# Patient Record
Sex: Male | Born: 1955 | Race: White | Hispanic: No | Marital: Married | Smoking: Never smoker
Health system: Southern US, Community
[De-identification: ages and names within clinical notes are randomized; demographics above are authoritative.]

## PROBLEM LIST (undated history)

## (undated) DIAGNOSIS — Z87442 Personal history of urinary calculi: Secondary | ICD-10-CM

## (undated) DIAGNOSIS — C801 Malignant (primary) neoplasm, unspecified: Secondary | ICD-10-CM

## (undated) DIAGNOSIS — I82409 Acute embolism and thrombosis of unspecified deep veins of unspecified lower extremity: Secondary | ICD-10-CM

## (undated) DIAGNOSIS — I2510 Atherosclerotic heart disease of native coronary artery without angina pectoris: Secondary | ICD-10-CM

## (undated) DIAGNOSIS — M1990 Unspecified osteoarthritis, unspecified site: Secondary | ICD-10-CM

## (undated) DIAGNOSIS — G4730 Sleep apnea, unspecified: Secondary | ICD-10-CM

## (undated) DIAGNOSIS — I10 Essential (primary) hypertension: Secondary | ICD-10-CM

## (undated) DIAGNOSIS — D696 Thrombocytopenia, unspecified: Secondary | ICD-10-CM

## (undated) DIAGNOSIS — M792 Neuralgia and neuritis, unspecified: Secondary | ICD-10-CM

## (undated) DIAGNOSIS — Z1211 Encounter for screening for malignant neoplasm of colon: Secondary | ICD-10-CM

## (undated) DIAGNOSIS — M1A9XX0 Chronic gout, unspecified, without tophus (tophi): Secondary | ICD-10-CM

## (undated) DIAGNOSIS — Z86718 Personal history of other venous thrombosis and embolism: Secondary | ICD-10-CM

## (undated) DIAGNOSIS — M5136 Other intervertebral disc degeneration, lumbar region: Secondary | ICD-10-CM

## (undated) DIAGNOSIS — F5101 Primary insomnia: Principal | ICD-10-CM

## (undated) DIAGNOSIS — G629 Polyneuropathy, unspecified: Secondary | ICD-10-CM

## (undated) DIAGNOSIS — M21372 Foot drop, left foot: Secondary | ICD-10-CM

## (undated) DIAGNOSIS — I82401 Acute embolism and thrombosis of unspecified deep veins of right lower extremity: Principal | ICD-10-CM

## (undated) DIAGNOSIS — R7989 Other specified abnormal findings of blood chemistry: Principal | ICD-10-CM

## (undated) DIAGNOSIS — G8929 Other chronic pain: Secondary | ICD-10-CM

## (undated) DIAGNOSIS — I509 Heart failure, unspecified: Secondary | ICD-10-CM

## (undated) DIAGNOSIS — M546 Pain in thoracic spine: Secondary | ICD-10-CM

## (undated) DIAGNOSIS — I82511 Chronic embolism and thrombosis of right femoral vein: Principal | ICD-10-CM

## (undated) DIAGNOSIS — R2681 Unsteadiness on feet: Secondary | ICD-10-CM

## (undated) DIAGNOSIS — I82531 Chronic embolism and thrombosis of right popliteal vein: Principal | ICD-10-CM

## (undated) DIAGNOSIS — R161 Splenomegaly, not elsewhere classified: Secondary | ICD-10-CM

## (undated) DIAGNOSIS — G4733 Obstructive sleep apnea (adult) (pediatric): Secondary | ICD-10-CM

## (undated) DIAGNOSIS — I4819 Other persistent atrial fibrillation: Secondary | ICD-10-CM

## (undated) DIAGNOSIS — D508 Other iron deficiency anemias: Secondary | ICD-10-CM

## (undated) DIAGNOSIS — I4891 Unspecified atrial fibrillation: Secondary | ICD-10-CM

## (undated) DIAGNOSIS — R5383 Other fatigue: Secondary | ICD-10-CM

## (undated) DIAGNOSIS — Z981 Arthrodesis status: Secondary | ICD-10-CM

## (undated) DIAGNOSIS — M1A09X0 Idiopathic chronic gout, multiple sites, without tophus (tophi): Secondary | ICD-10-CM

## (undated) DIAGNOSIS — R160 Hepatomegaly, not elsewhere classified: Secondary | ICD-10-CM

## (undated) DIAGNOSIS — M51369 Other intervertebral disc degeneration, lumbar region without mention of lumbar back pain or lower extremity pain: Secondary | ICD-10-CM

## (undated) DIAGNOSIS — M21371 Foot drop, right foot: Secondary | ICD-10-CM

## (undated) DIAGNOSIS — R5381 Other malaise: Secondary | ICD-10-CM

## (undated) DIAGNOSIS — I82501 Chronic embolism and thrombosis of unspecified deep veins of right lower extremity: Secondary | ICD-10-CM

## (undated) DIAGNOSIS — I442 Atrioventricular block, complete: Principal | ICD-10-CM

## (undated) DIAGNOSIS — Z125 Encounter for screening for malignant neoplasm of prostate: Secondary | ICD-10-CM

## (undated) HISTORY — PX: STRABISMUS SURGERY: SHX218

## (undated) HISTORY — PX: TESTICLE SURGERY: SHX794

## (undated) HISTORY — PX: CHOLECYSTECTOMY: SHX55

## (undated) HISTORY — PX: LEG SURGERY: SHX1003

## (undated) HISTORY — PX: HAND SURGERY: SHX662

## (undated) HISTORY — PX: BACK SURGERY: SHX140

## (undated) HISTORY — PX: KNEE ARTHROSCOPY: SUR90

## (undated) HISTORY — PX: CORONARY ARTERY BYPASS GRAFT: SHX141

## (undated) HISTORY — PX: SHOULDER ARTHROSCOPY: SHX128

---

## 2000-08-29 ENCOUNTER — Encounter: Payer: Self-pay | Admitting: Physical Medicine and Rehabilitation

## 2000-08-29 ENCOUNTER — Encounter
Admission: RE | Admit: 2000-08-29 | Discharge: 2000-08-29 | Payer: Self-pay | Admitting: Physical Medicine and Rehabilitation

## 2000-09-09 ENCOUNTER — Encounter: Payer: Self-pay | Admitting: Specialist

## 2000-09-09 ENCOUNTER — Ambulatory Visit (HOSPITAL_COMMUNITY): Admission: RE | Admit: 2000-09-09 | Discharge: 2000-09-09 | Payer: Self-pay | Admitting: Specialist

## 2011-01-17 DIAGNOSIS — J309 Allergic rhinitis, unspecified: Secondary | ICD-10-CM | POA: Insufficient documentation

## 2012-05-14 DIAGNOSIS — M51369 Other intervertebral disc degeneration, lumbar region without mention of lumbar back pain or lower extremity pain: Secondary | ICD-10-CM | POA: Insufficient documentation

## 2012-05-14 DIAGNOSIS — M5136 Other intervertebral disc degeneration, lumbar region: Secondary | ICD-10-CM | POA: Insufficient documentation

## 2012-05-14 DIAGNOSIS — M47816 Spondylosis without myelopathy or radiculopathy, lumbar region: Secondary | ICD-10-CM | POA: Insufficient documentation

## 2012-05-18 DIAGNOSIS — M48061 Spinal stenosis, lumbar region without neurogenic claudication: Secondary | ICD-10-CM | POA: Insufficient documentation

## 2012-08-03 DIAGNOSIS — H6980 Other specified disorders of Eustachian tube, unspecified ear: Secondary | ICD-10-CM | POA: Insufficient documentation

## 2013-10-29 NOTE — H&P (Signed)
Formatting of this note might be different from the original.  Planned procedure: LHC    Referring Cardiologist: Chad SoldersJohn Powers, MD    Preprocedure diagnosis: Abnormal stress Cardiolite suggestive apical ischemia.     UPDATED H&P:    Chart reviewed. Patient seen and re-examined.  History is as per recent office prep for surgery note of 10/26/2013.    ROS and Physical Exam are without significant change from the last office visit, and are as documented there.    Risks and benefits of planned procedure have been previously reviewed with the patient who understands and agrees to proceed. Plan is as per the office prep for surgery note and my note of AUg 06.    Chad Rosario, 10/29/2013    Electronically signed by Chad StarchJohn M McCabe, MD at 10/29/2013 11:51 AM EDT

## 2013-10-30 DIAGNOSIS — Z95820 Peripheral vascular angioplasty status with implants and grafts: Secondary | ICD-10-CM | POA: Insufficient documentation

## 2013-10-30 NOTE — Discharge Summary (Signed)
Formatting of this note is different from the original.  [facility]  Discharge Summary    PCP: Consuello Bossier, MD (General)  Discharge Details     Admit date:         10/29/2013  Discharge date:        10/30/2013   Hospital LOS:    1 days    Active Hospital Problems    Diagnosis Date Noted POA   ? S/P angioplasty with stent 10/30/2013 Not Applicable     Resolved Hospital Problems    Diagnosis Date Noted Date Resolved POA   No resolved problems to display.       Current Discharge Medication List     NEW medications    Details   aspirin (ECOTRIN) 81 mg EC tablet Take 1 tablet (81 mg total) by mouth daily.  Start date: 10/30/2013, End date: 10/30/2014     atorvastatin (LIPITOR) 40 mg tablet Take 1 tablet (40 mg total) by mouth at bedtime.  Start date: 10/30/2013, End date: 10/30/2014     metoprolol succinate (TOPROL-XL) 25 mg 24 hr tablet Take 1 tablet (25 mg total) by mouth daily.  Start date: 10/30/2013, End date: 10/30/2014     !! Prasugrel HCl (EFFIENT) 10 mg TABS Take 1 tablet (10 mg total) by mouth daily.  Start date: 10/30/2013     !! Prasugrel HCl (EFFIENT) 10 mg TABS Take 1 tablet (10 mg total) by mouth daily.  Start date: 10/30/2013      !! - Potential duplicate medications found. Please discuss with provider.     CONTINUED medications    Details   fexofenadine (ALLEGRA) 180 MG tablet Take 180 mg by mouth daily.     lisinopril (PRINIVIL,ZESTRIL) 10 mg tablet Take 1 tablet (10 mg total) by mouth daily.     sildenafil citrate (VIAGRA) 100 mg tablet Take 1 tablet (100 mg total) by mouth as needed for Erectile Dysfunction.     zolpidem tartrate (AMBIEN) 10 mg tablet Take one tablet by mouth at bedtime as needed for sleep.         Hospital Course   Physicians involved in care during this hospitalization  Attending Provider: Lucrezia Starch, MD  Admitting Provider: Lucrezia Starch, MD    Indication for Admission: Coronary disease status post stent placement.  Hospital Course:        For details see admission  history and physical. Patient with history of chest discomfort and recent abnormal stress test was referred for cardiac catheterization which was done by Dr. Ivan Croft yesterday. He was found to have obstructive disease in his LAD. He underwent stent placement with a bare metal stent. He was noted to have thrombocytopenia of unknown etiology. Overnight laboratory values within expected limits. No chest discomfort. Vital signs stable. He'll be discharged home later this morning.    He is going to follow-up with his primary care physician next week with recheck CBC to evaluate his platelet count. Ideally Dr. Ivan Croft like for him to stay on Effient for a couple of months but minimum of one month with 81 mg aspirin. He was also discharged on low-dose beta blocker and statin therapy.    Follow-up the mid-level clinic in [place] in 2-3 weeks, Dr. Leeann Must in 3 months.    [facility] Care     Appointments which have been scheduled     Dec 02, 2013  3:00 PM   Lab with [facility]   [facility] (--)    [address]  [place] [address]   [phone]        Mar 03, 2014  8:00 AM   [facility]   [facility] (--)    [address]  [place] [address]   [phone]        Mar 08, 2014  8:30 AM   Office Visit with Beryle FlockAaron Michael Boals, MD   [facility] (--)    [address]  [place] [address]   [phone]           Code Status:   No Order    Time spent in discharge process:  less than 30 minutes     Electronically signed:  Lars Massonodd E Crawford, PA  10/30/2013 / 12:39 PM    Electronically signed by Lucrezia StarchJohn M McCabe, MD at 11/02/2013  5:10 PM EDT

## 2013-10-30 NOTE — Nursing Note (Signed)
Formatting of this note might be different from the original.  Discharge summary and wound care reviewed with pt. Pt stated understanding. Effient in hand at discharge.  Electronically signed by Arlis Portaherri Sue Sutphin, RN at 10/30/2013 12:38 PM EDT

## 2013-11-16 DIAGNOSIS — I2510 Atherosclerotic heart disease of native coronary artery without angina pectoris: Secondary | ICD-10-CM | POA: Insufficient documentation

## 2014-05-04 DIAGNOSIS — Z951 Presence of aortocoronary bypass graft: Secondary | ICD-10-CM | POA: Insufficient documentation

## 2014-05-05 DIAGNOSIS — E785 Hyperlipidemia, unspecified: Secondary | ICD-10-CM | POA: Insufficient documentation

## 2014-08-05 DIAGNOSIS — I10 Essential (primary) hypertension: Secondary | ICD-10-CM | POA: Insufficient documentation

## 2015-03-09 DIAGNOSIS — D126 Benign neoplasm of colon, unspecified: Secondary | ICD-10-CM | POA: Insufficient documentation

## 2015-03-21 DIAGNOSIS — R161 Splenomegaly, not elsewhere classified: Secondary | ICD-10-CM | POA: Insufficient documentation

## 2015-06-28 DIAGNOSIS — M1000 Idiopathic gout, unspecified site: Secondary | ICD-10-CM | POA: Insufficient documentation

## 2015-09-22 DIAGNOSIS — I10 Essential (primary) hypertension: Secondary | ICD-10-CM | POA: Insufficient documentation

## 2015-09-22 DIAGNOSIS — T79A22A Traumatic compartment syndrome of left lower extremity, initial encounter: Secondary | ICD-10-CM | POA: Insufficient documentation

## 2015-11-01 DIAGNOSIS — I82411 Acute embolism and thrombosis of right femoral vein: Secondary | ICD-10-CM | POA: Insufficient documentation

## 2015-11-13 DIAGNOSIS — C44320 Squamous cell carcinoma of skin of unspecified parts of face: Secondary | ICD-10-CM | POA: Insufficient documentation

## 2015-12-08 ENCOUNTER — Other Ambulatory Visit: Payer: Self-pay | Admitting: Neurological Surgery

## 2015-12-08 DIAGNOSIS — M48061 Spinal stenosis, lumbar region without neurogenic claudication: Secondary | ICD-10-CM

## 2015-12-18 ENCOUNTER — Ambulatory Visit (INDEPENDENT_AMBULATORY_CARE_PROVIDER_SITE_OTHER): Payer: BLUE CROSS/BLUE SHIELD

## 2015-12-18 DIAGNOSIS — M4806 Spinal stenosis, lumbar region: Secondary | ICD-10-CM | POA: Diagnosis not present

## 2015-12-18 DIAGNOSIS — M48061 Spinal stenosis, lumbar region without neurogenic claudication: Secondary | ICD-10-CM

## 2015-12-18 DIAGNOSIS — M5116 Intervertebral disc disorders with radiculopathy, lumbar region: Secondary | ICD-10-CM | POA: Diagnosis not present

## 2016-07-26 ENCOUNTER — Other Ambulatory Visit: Payer: Self-pay | Admitting: Neurological Surgery

## 2016-07-26 DIAGNOSIS — M5136 Other intervertebral disc degeneration, lumbar region: Secondary | ICD-10-CM

## 2016-08-04 ENCOUNTER — Ambulatory Visit
Admission: RE | Admit: 2016-08-04 | Discharge: 2016-08-04 | Disposition: A | Discharge status: Home / Self Care | Payer: BLUE CROSS/BLUE SHIELD | Source: Ambulatory Visit | Attending: Neurological Surgery | Admitting: Neurological Surgery

## 2016-08-04 DIAGNOSIS — M5136 Other intervertebral disc degeneration, lumbar region: Secondary | ICD-10-CM

## 2016-08-06 ENCOUNTER — Other Ambulatory Visit: Payer: Self-pay | Admitting: Neurological Surgery

## 2016-08-06 ENCOUNTER — Ambulatory Visit (INDEPENDENT_AMBULATORY_CARE_PROVIDER_SITE_OTHER): Payer: 59

## 2016-08-06 DIAGNOSIS — M48062 Spinal stenosis, lumbar region with neurogenic claudication: Secondary | ICD-10-CM

## 2016-08-06 DIAGNOSIS — M4185 Other forms of scoliosis, thoracolumbar region: Secondary | ICD-10-CM

## 2016-08-06 DIAGNOSIS — M5136 Other intervertebral disc degeneration, lumbar region: Secondary | ICD-10-CM

## 2016-08-13 ENCOUNTER — Other Ambulatory Visit: Payer: Self-pay | Admitting: Neurological Surgery

## 2016-09-03 NOTE — Pre-Procedure Instructions (Signed)
Frederick Phillips  09/03/2016      [facility], [place] [address] Phone: [phone] Fax: [phone]    Your procedure is scheduled on June 21  Report to [place] at 900 A.M.  Call this number if you have problems the morning of surgery:  [phone]   Remember:  Do not eat food or drink liquids after midnight.  Take these medicines the morning of surgery with A SIP OF WATER fexofenadine (Allegra)  Stop taking aspirin, and Xarelto as directed by your Dr.  Stop taking BC's, Goody's, Herbal medications, Fish Oil, Vitamins, Ibuprofen, Advil, Motrin, Aleve   Do not wear jewelry, make-up or nail polish.  Do not wear lotions, powders, or perfumes, or deoderant.  Do not shave 48 hours prior to surgery.  Men may shave face and neck.  Do not bring valuables to the hospital.  [facility] is not responsible for any belongings or valuables.  Contacts, dentures or bridgework may not be worn into surgery.  Leave your suitcase in the car.  After surgery it may be brought to your room.  For patients admitted to the hospital, discharge time will be determined by your treatment team.  Patients discharged the day of surgery will not be allowed to drive home.    Special instructions:  [name] - Preparing for Surgery  Before surgery, you can play an important role.  Because skin is not sterile, your skin needs to be as free of germs as possible.  You can reduce the number of germs on you skin by washing with CHG (chlorahexidine gluconate) soap before surgery.  CHG is an antiseptic cleaner which kills germs and bonds with the skin to continue killing germs even after washing.  Please DO NOT use if you have an allergy to CHG or antibacterial soaps.  If your skin becomes reddened/irritated stop using the CHG and inform your nurse when you arrive at Short Stay.  Do not shave  (including legs and underarms) for at least 48 hours prior to the first CHG shower.  You may shave your face.  Please follow these instructions carefully:   1.  Shower with CHG Soap the night before surgery and the   morning of Surgery.  2.  If you choose to wash your hair, wash your hair first as usual with your normal shampoo.  3.  After you shampoo, rinse your hair and body thoroughly to remove the Shampoo.  4.  Use CHG as you would any other liquid soap.  You can apply chg directly to the skin and wash gently with scrungie or a clean washcloth.  5.  Apply the CHG Soap to your body ONLY FROM THE NECK DOWN.  Do not use on open wounds or open sores.  Avoid contact with your eyes,       ears, mouth and genitals (private parts).  Wash genitals (private parts)  with your normal soap.  6.  Wash thoroughly, paying special attention to the area where your surgery will be performed.  7.  Thoroughly rinse your body with warm water from the neck down.  8.  DO NOT shower/wash with your normal soap after using and rinsing off  the CHG Soap.  9.  Pat yourself dry with a clean towel.            10.  Wear clean pajamas.  11.  Place clean sheets on your bed the night of your first shower and do not sleep with pets.  Day of Surgery  Do not apply any lotions/deoderants the morning of surgery.  Please wear clean clothes to the hospital/surgery center.     Please read over the following fact sheets that you were given. Pain Booklet, Coughing and Deep Breathing, MRSA Information and Surgical Site Infection Prevention

## 2016-09-04 ENCOUNTER — Ambulatory Visit (HOSPITAL_COMMUNITY)
Admission: RE | Admit: 2016-09-04 | Discharge: 2016-09-04 | Disposition: A | Discharge status: Home / Self Care | Payer: 59 | Source: Ambulatory Visit | Attending: Neurological Surgery | Admitting: Neurological Surgery

## 2016-09-04 ENCOUNTER — Encounter (HOSPITAL_COMMUNITY): Payer: Self-pay

## 2016-09-04 ENCOUNTER — Encounter (HOSPITAL_COMMUNITY)
Admission: RE | Admit: 2016-09-04 | Discharge: 2016-09-04 | Disposition: A | Discharge status: Home / Self Care | Payer: 59 | Source: Ambulatory Visit | Attending: Neurological Surgery | Admitting: Neurological Surgery

## 2016-09-04 DIAGNOSIS — Z01810 Encounter for preprocedural cardiovascular examination: Secondary | ICD-10-CM | POA: Insufficient documentation

## 2016-09-04 DIAGNOSIS — Z01818 Encounter for other preprocedural examination: Secondary | ICD-10-CM | POA: Diagnosis present

## 2016-09-04 DIAGNOSIS — M4310 Spondylolisthesis, site unspecified: Secondary | ICD-10-CM

## 2016-09-04 DIAGNOSIS — Z955 Presence of coronary angioplasty implant and graft: Secondary | ICD-10-CM | POA: Diagnosis not present

## 2016-09-04 DIAGNOSIS — Z01812 Encounter for preprocedural laboratory examination: Secondary | ICD-10-CM | POA: Insufficient documentation

## 2016-09-04 HISTORY — DX: Acute embolism and thrombosis of unspecified deep veins of unspecified lower extremity: I82.409

## 2016-09-04 HISTORY — DX: Malignant (primary) neoplasm, unspecified: C80.1

## 2016-09-04 HISTORY — DX: Personal history of urinary calculi: Z87.442

## 2016-09-04 HISTORY — DX: Atherosclerotic heart disease of native coronary artery without angina pectoris: I25.10

## 2016-09-04 HISTORY — DX: Unspecified osteoarthritis, unspecified site: M19.90

## 2016-09-04 HISTORY — DX: Sleep apnea, unspecified: G47.30

## 2016-09-04 HISTORY — DX: Essential (primary) hypertension: I10

## 2016-09-04 LAB — BASIC METABOLIC PANEL
ANION GAP: 9 (ref 5–15)
BUN: 16 mg/dL (ref 6–20)
CHLORIDE: 104 mmol/L (ref 101–111)
CO2: 24 mmol/L (ref 22–32)
CREATININE: 1.07 mg/dL (ref 0.61–1.24)
Calcium: 9.4 mg/dL (ref 8.9–10.3)
GFR calc non Af Amer: >60 mL/min (ref >60)
Glucose, Bld: 82 mg/dL (ref 65–99)
POTASSIUM: 4.5 mmol/L (ref 3.5–5.1)
Sodium: 137 mmol/L (ref 135–145)

## 2016-09-04 LAB — TYPE AND SCREEN
ABO/RH(D): A POS
Antibody Screen: NEGATIVE

## 2016-09-04 LAB — CBC WITH DIFFERENTIAL/PLATELET
Basophils Absolute: 0.1 10*3/uL (ref 0.0–0.1)
Basophils Relative: 1 %
Eosinophils Absolute: 0.7 10*3/uL (ref 0.0–0.7)
Eosinophils Relative: 9 %
HEMATOCRIT: 45.6 % (ref 39.0–52.0)
Hemoglobin: 16.3 g/dL (ref 13.0–17.0)
LYMPHS ABS: 1.9 10*3/uL (ref 0.7–4.0)
LYMPHS PCT: 25 %
MCH: 29.3 pg (ref 26.0–34.0)
MCHC: 35.7 g/dL (ref 30.0–36.0)
MCV: 82 fL (ref 78.0–100.0)
MONOS PCT: 11 %
Monocytes Absolute: 0.9 10*3/uL (ref 0.1–1.0)
NEUTROS ABS: 4 10*3/uL (ref 1.7–7.7)
NEUTROS PCT: 53 %
Platelets: 106 10*3/uL — ABNORMAL LOW (ref 150–400)
RBC: 5.56 MIL/uL (ref 4.22–5.81)
RDW: 14 % (ref 11.5–15.5)
WBC: 7.5 10*3/uL (ref 4.0–10.5)

## 2016-09-04 LAB — SURGICAL PCR SCREEN
MRSA, PCR: NEGATIVE
Staphylococcus aureus: NEGATIVE

## 2016-09-04 LAB — ABO/RH: ABO/RH(D): A POS

## 2016-09-04 MED ORDER — CHLORHEXIDINE GLUCONATE CLOTH 2 % EX PADS: 6.0000 | MEDICATED_PAD | Freq: Once | CUTANEOUS | Status: DC

## 2016-09-04 NOTE — Progress Notes (Signed)
PCP - Joneen Boers Cardiologist - Lamar Blinks Pulmonologist- Dr. Michela Pitcher Pt sees Dr. Gayla Medicus regarding CPAP  Chest x-ray - 09/04/2016  EKG - 09/04/2016  Stress Test - requested from MD ECHO - requested from MD Cardiac Cath - requested from [facility]  Sleep Study - pt unsure where or when this was done, "years ago" CPAP - pt wears CPAP and states he knows his pressure settings, will bring mask DOS  Pt denies any cardiac issues since his CABG. Pt on Xarelto and Aspirin for hx DVT. Pt states he was instructed to stop taking this medicine- last dose 09/03/16  Patient denies shortness of breath, fever, cough and chest pain at PAT appointment  Patient verbalized understanding of instructions that were given to them at the PAT appointment. Patient was also instructed that they will need to review over the PAT instructions again at home before surgery.

## 2016-09-05 NOTE — Progress Notes (Signed)
Anesthesia Chart Review:  Pt is a 61 year old male scheduled for L2-3, L3-4, L4-5 PLIF, L5-S1 bilateral hemilaminectomy on 09/12/2016 with Sherley Bounds, M.D.  - PCP is Joneen Boers, MD (notes in care everywhere)  - Cardiologist is Lamar Blinks, MD who cleared pt for surgery (notes in care everywhere). Last office visit 01/29/16; 1 year f/u recommended.   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 Never smoker. BMI 36.5.  Medications include: ASA 81 mg, lisinopril, xarelto, simvastatin. Pt reported last dose xarelto 09/03/16.  - I spoke with Lorriane Shire from Dr. Ronnald Ramp' office about timing of xarelto hold for surgery.  She spoke with pt who will resume xarelto now and hold 3 days before surgery.   Preoperative labs reviewed.   - Platelets 106.  - PT will be obtained DOS.   CXR 09/04/16:  1. No evidence of active disease. 2. Status post CABG and coronary stenting.  EKG 09/04/16: NSR  Stress echo 08/05/14 (from Dr. Shearon Stalls notes): good exercise tolerance and no ischemia and no significant valvular abnormalities.  Cardiac cath 05/06/14 (care everywhere):  - Severe native LAD disease as mentioned above. - 2 out of 2 grafts are patent - Nonobstructive coronary disease elsewhere - Normal LVEDP  If no changes, I anticipate pt can proceed with surgery as scheduled.   Willeen Cass, FNP-BC [facility] Short Stay Surgical Center/Anesthesiology Phone: [phone] 09/05/2016 1:39 PM

## 2016-09-11 MED ORDER — DEXTROSE 5 % IV SOLN
3.0000 g | INTRAVENOUS | Status: AC
  Administered 2016-09-12 (×2): 3 g via INTRAVENOUS
  Filled 2016-09-11: qty 3000

## 2016-09-11 MED ORDER — DEXAMETHASONE SODIUM PHOSPHATE 10 MG/ML IJ SOLN
10.0000 mg | INTRAMUSCULAR | Status: AC
  Administered 2016-09-12: 10 mg via INTRAVENOUS
  Filled 2016-09-11: qty 1

## 2016-09-12 ENCOUNTER — Inpatient Hospital Stay (HOSPITAL_COMMUNITY): Payer: 59 | Admitting: Emergency Medicine

## 2016-09-12 ENCOUNTER — Inpatient Hospital Stay (HOSPITAL_COMMUNITY)
Admission: RE | Admit: 2016-09-12 | Discharge: 2016-09-13 | DRG: 455 | Disposition: A | Discharge status: Home / Self Care | Payer: 59 | Source: Ambulatory Visit | Attending: Neurological Surgery | Admitting: Neurological Surgery

## 2016-09-12 ENCOUNTER — Inpatient Hospital Stay (HOSPITAL_COMMUNITY)
Admission: RE | Disposition: A | Discharge status: Home / Self Care | Payer: Self-pay | Source: Ambulatory Visit | Attending: Neurological Surgery

## 2016-09-12 ENCOUNTER — Inpatient Hospital Stay (HOSPITAL_COMMUNITY): Payer: 59

## 2016-09-12 ENCOUNTER — Inpatient Hospital Stay (HOSPITAL_COMMUNITY): Payer: 59 | Admitting: Anesthesiology

## 2016-09-12 ENCOUNTER — Encounter (HOSPITAL_COMMUNITY): Payer: Self-pay | Admitting: Certified Registered Nurse Anesthetist

## 2016-09-12 DIAGNOSIS — M48061 Spinal stenosis, lumbar region without neurogenic claudication: Secondary | ICD-10-CM | POA: Diagnosis present

## 2016-09-12 DIAGNOSIS — E669 Obesity, unspecified: Secondary | ICD-10-CM | POA: Diagnosis present

## 2016-09-12 DIAGNOSIS — Z6837 Body mass index (BMI) 37.0-37.9, adult: Secondary | ICD-10-CM | POA: Diagnosis not present

## 2016-09-12 DIAGNOSIS — Z85828 Personal history of other malignant neoplasm of skin: Secondary | ICD-10-CM | POA: Diagnosis not present

## 2016-09-12 DIAGNOSIS — M5136 Other intervertebral disc degeneration, lumbar region: Secondary | ICD-10-CM | POA: Diagnosis present

## 2016-09-12 DIAGNOSIS — Z419 Encounter for procedure for purposes other than remedying health state, unspecified: Secondary | ICD-10-CM

## 2016-09-12 DIAGNOSIS — Z951 Presence of aortocoronary bypass graft: Secondary | ICD-10-CM | POA: Diagnosis not present

## 2016-09-12 DIAGNOSIS — M545 Low back pain: Secondary | ICD-10-CM | POA: Diagnosis present

## 2016-09-12 DIAGNOSIS — M4316 Spondylolisthesis, lumbar region: Secondary | ICD-10-CM | POA: Diagnosis present

## 2016-09-12 DIAGNOSIS — I10 Essential (primary) hypertension: Secondary | ICD-10-CM | POA: Diagnosis present

## 2016-09-12 DIAGNOSIS — I2510 Atherosclerotic heart disease of native coronary artery without angina pectoris: Secondary | ICD-10-CM | POA: Diagnosis present

## 2016-09-12 DIAGNOSIS — Z981 Arthrodesis status: Secondary | ICD-10-CM

## 2016-09-12 DIAGNOSIS — G4730 Sleep apnea, unspecified: Secondary | ICD-10-CM | POA: Diagnosis present

## 2016-09-12 HISTORY — PX: LUMBAR LAMINECTOMY/DECOMPRESSION MICRODISCECTOMY: SHX5026

## 2016-09-12 LAB — PROTIME-INR
INR: 1.05
Prothrombin Time: 13.7 seconds (ref 11.4–15.2)

## 2016-09-12 SURGERY — POSTERIOR LUMBAR FUSION 3 LEVEL
Anesthesia: General | Site: Back

## 2016-09-12 MED ORDER — PROPOFOL 10 MG/ML IV BOLUS
INTRAVENOUS | Status: AC
  Filled 2016-09-12: qty 20

## 2016-09-12 MED ORDER — KETOROLAC TROMETHAMINE 30 MG/ML IJ SOLN
INTRAMUSCULAR | Status: DC | PRN
  Administered 2016-09-12: 30 mg via INTRAVENOUS

## 2016-09-12 MED ORDER — THROMBIN 20000 UNITS EX SOLR
CUTANEOUS | Status: DC | PRN
  Administered 2016-09-12: 13:00:00 via TOPICAL

## 2016-09-12 MED ORDER — SODIUM CHLORIDE 0.9 % IV SOLN
250.0000 mL | INTRAVENOUS | Status: DC
  Administered 2016-09-12: 250 mL via INTRAVENOUS

## 2016-09-12 MED ORDER — FENTANYL CITRATE (PF) 100 MCG/2ML IJ SOLN
INTRAMUSCULAR | Status: DC | PRN
  Administered 2016-09-12 (×4): 50 ug via INTRAVENOUS
  Administered 2016-09-12 (×2): 100 ug via INTRAVENOUS
  Administered 2016-09-12 (×2): 50 ug via INTRAVENOUS

## 2016-09-12 MED ORDER — PHENYLEPHRINE 40 MCG/ML (10ML) SYRINGE FOR IV PUSH (FOR BLOOD PRESSURE SUPPORT)
PREFILLED_SYRINGE | INTRAVENOUS | Status: AC
  Filled 2016-09-12: qty 10

## 2016-09-12 MED ORDER — ROCURONIUM BROMIDE 10 MG/ML (PF) SYRINGE
PREFILLED_SYRINGE | INTRAVENOUS | Status: AC
  Filled 2016-09-12: qty 5

## 2016-09-12 MED ORDER — DEXMEDETOMIDINE HCL IN NACL 200 MCG/50ML IV SOLN
INTRAVENOUS | Status: DC | PRN
  Administered 2016-09-12: 15:00:00 via INTRAVENOUS
  Administered 2016-09-12: .7 ug/kg/h via INTRAVENOUS

## 2016-09-12 MED ORDER — SUGAMMADEX SODIUM 200 MG/2ML IV SOLN
INTRAVENOUS | Status: DC | PRN
  Administered 2016-09-12 (×2): 100 mg via INTRAVENOUS

## 2016-09-12 MED ORDER — MORPHINE SULFATE (PF) 2 MG/ML IV SOLN
2.0000 mg | INTRAVENOUS | Status: DC | PRN
  Administered 2016-09-13: 2 mg via INTRAVENOUS
  Filled 2016-09-12: qty 1

## 2016-09-12 MED ORDER — ONDANSETRON HCL 4 MG/2ML IJ SOLN
INTRAMUSCULAR | Status: AC
  Filled 2016-09-12: qty 2

## 2016-09-12 MED ORDER — 0.9 % SODIUM CHLORIDE (POUR BTL) OPTIME
TOPICAL | Status: DC | PRN
  Administered 2016-09-12: 1000 mL

## 2016-09-12 MED ORDER — SENNA 8.6 MG PO TABS
1.0000 | ORAL_TABLET | Freq: Two times a day (BID) | ORAL | Status: DC
  Administered 2016-09-12 – 2016-09-13 (×2): 8.6 mg via ORAL
  Filled 2016-09-12 (×2): qty 1

## 2016-09-12 MED ORDER — ALBUMIN HUMAN 5 % IV SOLN
INTRAVENOUS | Status: DC | PRN
  Administered 2016-09-12: 16:00:00 via INTRAVENOUS

## 2016-09-12 MED ORDER — BACITRACIN 50000 UNITS IM SOLR
INTRAMUSCULAR | Status: DC | PRN
  Administered 2016-09-12: 13:00:00

## 2016-09-12 MED ORDER — DEXMEDETOMIDINE HCL 200 MCG/2ML IV SOLN
INTRAVENOUS | Status: DC | PRN
  Administered 2016-09-12 (×2): 12 ug via INTRAVENOUS

## 2016-09-12 MED ORDER — SODIUM CHLORIDE 0.9% FLUSH
3.0000 mL | Freq: Two times a day (BID) | INTRAVENOUS | Status: DC
  Administered 2016-09-12 – 2016-09-13 (×2): 3 mL via INTRAVENOUS

## 2016-09-12 MED ORDER — OXYCODONE HCL 5 MG PO TABS
ORAL_TABLET | ORAL | Status: AC
  Filled 2016-09-12: qty 1

## 2016-09-12 MED ORDER — FENTANYL CITRATE (PF) 100 MCG/2ML IJ SOLN
INTRAMUSCULAR | Status: AC
  Filled 2016-09-12: qty 2

## 2016-09-12 MED ORDER — PHENYLEPHRINE HCL 10 MG/ML IJ SOLN
INTRAVENOUS | Status: DC | PRN
  Administered 2016-09-12: 20 ug/min via INTRAVENOUS

## 2016-09-12 MED ORDER — ACETAMINOPHEN 650 MG RE SUPP: 650.0000 mg | RECTAL | Status: DC | PRN

## 2016-09-12 MED ORDER — ASPIRIN EC 81 MG PO TBEC
81.0000 mg | DELAYED_RELEASE_TABLET | Freq: Every day | ORAL | Status: DC
  Administered 2016-09-13: 81 mg via ORAL
  Filled 2016-09-12: qty 1

## 2016-09-12 MED ORDER — LACTATED RINGERS IV SOLN
INTRAVENOUS | Status: DC | PRN
  Administered 2016-09-12 (×3): via INTRAVENOUS

## 2016-09-12 MED ORDER — MIDAZOLAM HCL 5 MG/5ML IJ SOLN
INTRAMUSCULAR | Status: DC | PRN
  Administered 2016-09-12: 2 mg via INTRAVENOUS

## 2016-09-12 MED ORDER — SODIUM CHLORIDE 0.9 % IV SOLN
INTRAVENOUS | Status: DC | PRN
  Administered 2016-09-12: 15:00:00 via INTRAVENOUS

## 2016-09-12 MED ORDER — VANCOMYCIN HCL 1000 MG IV SOLR
INTRAVENOUS | Status: DC | PRN
  Administered 2016-09-12: 1000 mg

## 2016-09-12 MED ORDER — OXYCODONE HCL 5 MG PO TABS
5.0000 mg | ORAL_TABLET | ORAL | Status: DC | PRN
  Administered 2016-09-12 – 2016-09-13 (×3): 10 mg via ORAL
  Filled 2016-09-12 (×3): qty 2

## 2016-09-12 MED ORDER — FENTANYL CITRATE (PF) 250 MCG/5ML IJ SOLN
INTRAMUSCULAR | Status: AC
  Filled 2016-09-12: qty 5

## 2016-09-12 MED ORDER — FENTANYL CITRATE (PF) 100 MCG/2ML IJ SOLN
25.0000 ug | INTRAMUSCULAR | Status: DC | PRN
  Administered 2016-09-12 (×2): 50 ug via INTRAVENOUS

## 2016-09-12 MED ORDER — METHOCARBAMOL 500 MG PO TABS
500.0000 mg | ORAL_TABLET | Freq: Four times a day (QID) | ORAL | Status: DC | PRN
  Administered 2016-09-13: 500 mg via ORAL
  Filled 2016-09-12: qty 1

## 2016-09-12 MED ORDER — BUPIVACAINE HCL (PF) 0.25 % IJ SOLN
INTRAMUSCULAR | Status: AC
  Filled 2016-09-12: qty 30

## 2016-09-12 MED ORDER — LISINOPRIL 20 MG PO TABS
40.0000 mg | ORAL_TABLET | Freq: Every day | ORAL | Status: DC
  Administered 2016-09-13: 40 mg via ORAL
  Filled 2016-09-12: qty 2

## 2016-09-12 MED ORDER — BUPIVACAINE HCL (PF) 0.25 % IJ SOLN
INTRAMUSCULAR | Status: DC | PRN
  Administered 2016-09-12: 5 mL

## 2016-09-12 MED ORDER — LIDOCAINE 2% (20 MG/ML) 5 ML SYRINGE
INTRAMUSCULAR | Status: DC | PRN
Start: 2016-09-12 — End: 2016-09-12
  Administered 2016-09-12: 60 mg via INTRAVENOUS

## 2016-09-12 MED ORDER — HYDROMORPHONE HCL 1 MG/ML IJ SOLN
INTRAMUSCULAR | Status: AC
  Filled 2016-09-12: qty 0.5

## 2016-09-12 MED ORDER — PHENOL 1.4 % MT LIQD: 1.0000 | OROMUCOSAL | Status: DC | PRN

## 2016-09-12 MED ORDER — LIDOCAINE 2% (20 MG/ML) 5 ML SYRINGE
INTRAMUSCULAR | Status: AC
  Filled 2016-09-12: qty 10

## 2016-09-12 MED ORDER — THROMBIN 5000 UNITS EX SOLR
CUTANEOUS | Status: DC | PRN
  Administered 2016-09-12 (×2): via TOPICAL

## 2016-09-12 MED ORDER — OXYCODONE HCL 5 MG/5ML PO SOLN: 5.0000 mg | Freq: Once | ORAL | Status: AC | PRN

## 2016-09-12 MED ORDER — MENTHOL 3 MG MT LOZG: 1.0000 | LOZENGE | OROMUCOSAL | Status: DC | PRN

## 2016-09-12 MED ORDER — MIDAZOLAM HCL 2 MG/2ML IJ SOLN
INTRAMUSCULAR | Status: AC
  Filled 2016-09-12: qty 2

## 2016-09-12 MED ORDER — SUGAMMADEX SODIUM 200 MG/2ML IV SOLN
INTRAVENOUS | Status: AC
  Filled 2016-09-12: qty 2

## 2016-09-12 MED ORDER — DEXAMETHASONE SODIUM PHOSPHATE 10 MG/ML IJ SOLN
INTRAMUSCULAR | Status: AC
  Filled 2016-09-12: qty 1

## 2016-09-12 MED ORDER — THROMBIN 5000 UNITS EX SOLR
CUTANEOUS | Status: AC
  Filled 2016-09-12: qty 5000

## 2016-09-12 MED ORDER — PROPOFOL 10 MG/ML IV BOLUS
INTRAVENOUS | Status: DC | PRN
  Administered 2016-09-12: 200 mg via INTRAVENOUS

## 2016-09-12 MED ORDER — ONDANSETRON HCL 4 MG/2ML IJ SOLN: 4.0000 mg | Freq: Four times a day (QID) | INTRAMUSCULAR | Status: DC | PRN

## 2016-09-12 MED ORDER — THROMBIN 20000 UNITS EX SOLR
CUTANEOUS | Status: AC
  Filled 2016-09-12: qty 20000

## 2016-09-12 MED ORDER — ACETAMINOPHEN 325 MG PO TABS: 650.0000 mg | ORAL_TABLET | ORAL | Status: DC | PRN

## 2016-09-12 MED ORDER — KETOROLAC TROMETHAMINE 30 MG/ML IJ SOLN
INTRAMUSCULAR | Status: AC
  Filled 2016-09-12: qty 1

## 2016-09-12 MED ORDER — DEXAMETHASONE SODIUM PHOSPHATE 10 MG/ML IJ SOLN
10.0000 mg | Freq: Once | INTRAMUSCULAR | Status: AC
  Administered 2016-09-12: 10 mg via INTRAVENOUS

## 2016-09-12 MED ORDER — CEFAZOLIN SODIUM-DEXTROSE 2-4 GM/100ML-% IV SOLN
2.0000 g | Freq: Three times a day (TID) | INTRAVENOUS | Status: AC
  Administered 2016-09-12 – 2016-09-13 (×2): 2 g via INTRAVENOUS
  Filled 2016-09-12 (×2): qty 100

## 2016-09-12 MED ORDER — ONDANSETRON HCL 4 MG/2ML IJ SOLN: 4.0000 mg | Freq: Once | INTRAMUSCULAR | Status: DC | PRN

## 2016-09-12 MED ORDER — VANCOMYCIN HCL 1000 MG IV SOLR
INTRAVENOUS | Status: AC
  Filled 2016-09-12: qty 1000

## 2016-09-12 MED ORDER — ARTIFICIAL TEARS OPHTHALMIC OINT
TOPICAL_OINTMENT | OPHTHALMIC | Status: AC
  Filled 2016-09-12: qty 3.5

## 2016-09-12 MED ORDER — PHENYLEPHRINE 40 MCG/ML (10ML) SYRINGE FOR IV PUSH (FOR BLOOD PRESSURE SUPPORT)
PREFILLED_SYRINGE | INTRAVENOUS | Status: DC | PRN
  Administered 2016-09-12: 80 ug via INTRAVENOUS
  Administered 2016-09-12: 40 ug via INTRAVENOUS
  Administered 2016-09-12 (×3): 80 ug via INTRAVENOUS

## 2016-09-12 MED ORDER — DEXMEDETOMIDINE HCL IN NACL 200 MCG/50ML IV SOLN
INTRAVENOUS | Status: AC
  Filled 2016-09-12: qty 50

## 2016-09-12 MED ORDER — SODIUM CHLORIDE 0.9% FLUSH: 3.0000 mL | INTRAVENOUS | Status: DC | PRN

## 2016-09-12 MED ORDER — DEXTROSE 5 % IV SOLN
3.0000 g | INTRAVENOUS | Status: DC
  Filled 2016-09-12: qty 3000

## 2016-09-12 MED ORDER — METHOCARBAMOL 1000 MG/10ML IJ SOLN
500.0000 mg | Freq: Four times a day (QID) | INTRAVENOUS | Status: DC | PRN
  Filled 2016-09-12: qty 5

## 2016-09-12 MED ORDER — ROCURONIUM BROMIDE 10 MG/ML (PF) SYRINGE
PREFILLED_SYRINGE | INTRAVENOUS | Status: DC | PRN
  Administered 2016-09-12 (×4): 50 mg via INTRAVENOUS

## 2016-09-12 MED ORDER — ONDANSETRON HCL 4 MG PO TABS: 4.0000 mg | ORAL_TABLET | Freq: Four times a day (QID) | ORAL | Status: DC | PRN

## 2016-09-12 MED ORDER — OXYCODONE HCL 5 MG PO TABS
5.0000 mg | ORAL_TABLET | Freq: Once | ORAL | Status: AC | PRN
  Administered 2016-09-12: 5 mg via ORAL

## 2016-09-12 MED ORDER — ONDANSETRON HCL 4 MG/2ML IJ SOLN
INTRAMUSCULAR | Status: DC | PRN
  Administered 2016-09-12: 4 mg via INTRAVENOUS

## 2016-09-12 MED ORDER — LIDOCAINE 2% (20 MG/ML) 5 ML SYRINGE
INTRAMUSCULAR | Status: AC
  Filled 2016-09-12: qty 5

## 2016-09-12 MED ORDER — ARTIFICIAL TEARS OPHTHALMIC OINT
TOPICAL_OINTMENT | OPHTHALMIC | Status: DC | PRN
  Administered 2016-09-12: 1 via OPHTHALMIC

## 2016-09-12 MED ORDER — CELECOXIB 200 MG PO CAPS
200.0000 mg | ORAL_CAPSULE | Freq: Two times a day (BID) | ORAL | Status: DC
  Administered 2016-09-12 – 2016-09-13 (×2): 200 mg via ORAL
  Filled 2016-09-12 (×2): qty 1

## 2016-09-12 MED ORDER — POTASSIUM CHLORIDE IN NACL 20-0.9 MEQ/L-% IV SOLN
INTRAVENOUS | Status: DC
  Administered 2016-09-12: 75 mL/h via INTRAVENOUS
  Filled 2016-09-12 (×2): qty 1000

## 2016-09-12 SURGICAL SUPPLY — 70 items
BAG DECANTER FOR FLEXI CONT (MISCELLANEOUS) ×3 IMPLANT
BASKET BONE COLLECTION (BASKET) ×3 IMPLANT
BENZOIN TINCTURE PRP APPL 2/3 (GAUZE/BANDAGES/DRESSINGS) ×3 IMPLANT
BLADE CLIPPER SURG (BLADE) IMPLANT
BONE CANC CHIPS 20CC PCAN1/4 (Bone Implant) ×3 IMPLANT
BUR MATCHSTICK NEURO 3.0 LAGG (BURR) ×3 IMPLANT
CANISTER SUCT 3000ML PPV (MISCELLANEOUS) IMPLANT
CARTRIDGE OIL MAESTRO DRILL (MISCELLANEOUS) ×2 IMPLANT
CHIPS CANC BONE 20CC PCAN1/4 (Bone Implant) ×2 IMPLANT
CONT SPEC 4OZ CLIKSEAL STRL BL (MISCELLANEOUS) ×3 IMPLANT
COVER BACK TABLE 60X90IN (DRAPES) ×3 IMPLANT
DERMABOND ADVANCED (GAUZE/BANDAGES/DRESSINGS) ×1
DERMABOND ADVANCED .7 DNX12 (GAUZE/BANDAGES/DRESSINGS) ×2 IMPLANT
DIFFUSER DRILL AIR PNEUMATIC (MISCELLANEOUS) ×3 IMPLANT
DRAPE C-ARM 42X72 X-RAY (DRAPES) ×6 IMPLANT
DRAPE LAPAROTOMY 100X72X124 (DRAPES) ×3 IMPLANT
DRAPE MICROSCOPE LEICA (MISCELLANEOUS) IMPLANT
DRAPE POUCH INSTRU U-SHP 10X18 (DRAPES) ×3 IMPLANT
DRAPE SURG 17X23 STRL (DRAPES) ×3 IMPLANT
DRSG OPSITE 4X5.5 SM (GAUZE/BANDAGES/DRESSINGS) ×6 IMPLANT
DRSG OPSITE POSTOP 4X10 (GAUZE/BANDAGES/DRESSINGS) ×3 IMPLANT
DURAPREP 26ML APPLICATOR (WOUND CARE) ×3 IMPLANT
ELECT REM PT RETURN 9FT ADLT (ELECTROSURGICAL) ×3
ELECTRODE REM PT RTRN 9FT ADLT (ELECTROSURGICAL) ×2 IMPLANT
EVACUATOR 1/8 PVC DRAIN (DRAIN) ×3 IMPLANT
GAUZE SPONGE 4X4 16PLY XRAY LF (GAUZE/BANDAGES/DRESSINGS) ×3 IMPLANT
GLOVE BIO SURGEON STRL SZ7 (GLOVE) IMPLANT
GLOVE BIO SURGEON STRL SZ7.5 (GLOVE) ×3 IMPLANT
GLOVE BIO SURGEON STRL SZ8 (GLOVE) ×12 IMPLANT
GLOVE BIO SURGEON STRL SZ8.5 (GLOVE) ×3 IMPLANT
GLOVE BIOGEL PI IND STRL 7.0 (GLOVE) IMPLANT
GLOVE BIOGEL PI IND STRL 7.5 (GLOVE) ×2 IMPLANT
GLOVE BIOGEL PI INDICATOR 7.0 (GLOVE)
GLOVE BIOGEL PI INDICATOR 7.5 (GLOVE) ×1
GOWN STRL REUS W/ TWL LRG LVL3 (GOWN DISPOSABLE) IMPLANT
GOWN STRL REUS W/ TWL XL LVL3 (GOWN DISPOSABLE) ×6 IMPLANT
GOWN STRL REUS W/TWL 2XL LVL3 (GOWN DISPOSABLE) IMPLANT
GOWN STRL REUS W/TWL LRG LVL3 (GOWN DISPOSABLE)
GOWN STRL REUS W/TWL XL LVL3 (GOWN DISPOSABLE) ×3
HEMOSTAT POWDER KIT SURGIFOAM (HEMOSTASIS) ×6 IMPLANT
KIT BASIN OR (CUSTOM PROCEDURE TRAY) ×3 IMPLANT
KIT ROOM TURNOVER OR (KITS) ×3 IMPLANT
MARKER SKIN DUAL TIP RULER LAB (MISCELLANEOUS) ×3 IMPLANT
NEEDLE HYPO 25X1 1.5 SAFETY (NEEDLE) ×3 IMPLANT
NEEDLE SPNL 20GX3.5 QUINCKE YW (NEEDLE) IMPLANT
NS IRRIG 1000ML POUR BTL (IV SOLUTION) ×3 IMPLANT
OIL CARTRIDGE MAESTRO DRILL (MISCELLANEOUS) ×3
PACK LAMINECTOMY NEURO (CUSTOM PROCEDURE TRAY) ×3 IMPLANT
PAD ARMBOARD 7.5X6 YLW CONV (MISCELLANEOUS) ×9 IMPLANT
ROD STRT TI 5.5X300 ARSENAL (Rod) ×3 IMPLANT
RUBBERBAND STERILE (MISCELLANEOUS) IMPLANT
SCREW CBX 5.5X35MM ARSENAL (Screw) ×12 IMPLANT
SCREW CORT CANC 5.5X40 (Screw) ×6 IMPLANT
SCREW SET SPINAL ARSENAL 47127 (Screw) ×18 IMPLANT
SPACER BATT PS 9X30X11 (Spacer) ×6 IMPLANT
SPACER BATT PS 9X30X12 (Spacer) ×6 IMPLANT
SPACER BATT PS 9X30X8 (Spacer) ×6 IMPLANT
SPONGE LAP 4X18 X RAY DECT (DISPOSABLE) ×3 IMPLANT
SPONGE SURGIFOAM ABS GEL 100 (HEMOSTASIS) ×3 IMPLANT
SPONGE SURGIFOAM ABS GEL SZ50 (HEMOSTASIS) IMPLANT
STRIP BIOACTIVE 10CC 25X100X4 (Miscellaneous) ×3 IMPLANT
STRIP CLOSURE SKIN 1/2X4 (GAUZE/BANDAGES/DRESSINGS) ×3 IMPLANT
SUT VIC AB 0 CT1 18XCR BRD8 (SUTURE) ×2 IMPLANT
SUT VIC AB 0 CT1 8-18 (SUTURE) ×1
SUT VIC AB 2-0 CP2 18 (SUTURE) ×3 IMPLANT
SUT VIC AB 3-0 SH 8-18 (SUTURE) ×6 IMPLANT
TOWEL GREEN STERILE (TOWEL DISPOSABLE) ×3 IMPLANT
TOWEL GREEN STERILE FF (TOWEL DISPOSABLE) ×3 IMPLANT
TRAY FOLEY W/METER SILVER 16FR (SET/KITS/TRAYS/PACK) ×3 IMPLANT
WATER STERILE IRR 1000ML POUR (IV SOLUTION) ×3 IMPLANT

## 2016-09-12 NOTE — H&P (Signed)
Subjective: Patient is a 61 y.o. male admitted for stenosis. Onset of symptoms was several years ago, gradually worsening since that time.  The pain is rated severe, unremitting, and is located at the across the lower back and radiates to legs. The pain is described as aching and occurs all day. The symptoms have been progressive. Symptoms are exacerbated by exercise. MRI or CT showed stenosis/ DDD/ spondylolisthesis. Remote previous surgery elsewhere at L4-5 with resulting CSF leak and wound infection requiring more surgery  Past Medical History:  Diagnosis Date  . Arthritis   . Cancer ([facility])    skin cancer  . Coronary artery disease   . DVT (deep venous thrombosis) ([facility])    right leg  . History of kidney stones   . Hypertension   . Sleep apnea    uses CPAP    Past Surgical History:  Procedure Laterality Date  . BACK SURGERY     laminectomy L4-L5  . CHOLECYSTECTOMY    . CORONARY ARTERY BYPASS GRAFT     double bypass at [facility]  . HAND SURGERY     surgical blade removed from left hand  . KNEE ARTHROSCOPY Right   . LEG SURGERY     left leg broken, then compartment syndrome from this surgery, surgery x5  . SHOULDER ARTHROSCOPY Bilateral   . STRABISMUS SURGERY    . TESTICLE SURGERY     61 yo    Prior to Admission medications   Medication Sig Start Date End Date Taking? Authorizing Provider  aspirin EC 81 MG tablet Take 81 mg by mouth daily.   Yes [provider]  fexofenadine (ALLEGRA) 180 MG tablet Take 180 mg by mouth daily.   Yes [provider]  lisinopril (PRINIVIL,ZESTRIL) 40 MG tablet Take 40 mg by mouth daily.   Yes [provider]  rivaroxaban (XARELTO) 10 MG TABS tablet Take 10 mg by mouth every morning.   Yes [provider]  simvastatin (ZOCOR) 10 MG tablet Take 10 mg by mouth every morning.   Yes [provider]   Allergies  Allergen Reactions  . Losartan Shortness Of Breath  . Amlodipine Other (See  Comments)    Fatigue   . Atorvastatin Diarrhea    Social History  Substance Use Topics  . Smoking status: Never Smoker  . Smokeless tobacco: Never Used  . Alcohol use Yes     Comment: socially 1-2 drinks per day    History reviewed. No pertinent family history.   Review of Systems  Positive ROS: neg  All other systems have been reviewed and were otherwise negative with the exception of those mentioned in the HPI and as above.  Objective: Vital signs in last 24 hours: Temp:  [98.5 F (36.9 C)] 98.5 F (36.9 C) (06/21 0916) Pulse Rate:  [66] 66 (06/21 0916) Resp:  [20] 20 (06/21 0916) BP: (150)/(86) 150/86 (06/21 0916) SpO2:  [97 %] 97 % (06/21 0916) Weight:  [124.6 kg (274 lb 10 oz)-125.6 kg (276 lb 12.8 oz)] 124.6 kg (274 lb 10 oz) (06/21 0950)  General Appearance: Alert, cooperative, no distress, appears stated age Head: Normocephalic, without obvious abnormality, atraumatic Eyes: PERRL, conjunctiva/corneas clear, EOM's intact    Neck: Supple, symmetrical, trachea midline Back: Symmetric, no curvature, ROM normal, no CVA tenderness Lungs:  respirations unlabored Heart: Regular rate and rhythm Abdomen: Soft, non-tender Extremities: Extremities normal, atraumatic, no cyanosis or edema Pulses: 2+ and symmetric all extremities Skin: Skin color, texture, turgor normal, no  rashes or lesions  NEUROLOGIC:   Mental status: Alert and oriented x4,  no aphasia, good attention span, fund of knowledge, and memory Motor Exam - grossly normal Sensory Exam - grossly normal Reflexes: 1+ Coordination - grossly normal Gait - grossly normal Balance - grossly normal Cranial Nerves: I: smell Not tested  II: visual acuity  OS: nl    OD: nl  II: visual fields Full to confrontation  II: pupils Equal, round, reactive to light  III,VII: ptosis None  III,IV,VI: extraocular muscles  Full ROM  V: mastication Normal  V: facial light touch sensation  Normal  V,VII: corneal reflex  Present   VII: facial muscle function - upper  Normal  VII: facial muscle function - lower Normal  VIII: hearing Not tested  IX: soft palate elevation  Normal  IX,X: gag reflex Present  XI: trapezius strength  5/5  XI: sternocleidomastoid strength 5/5  XI: neck flexion strength  5/5  XII: tongue strength  Normal    Data Review Lab Results  Component Value Date   WBC 7.5 09/04/2016   HGB 16.3 09/04/2016   HCT 45.6 09/04/2016   MCV 82.0 09/04/2016   PLT 106 (L) 09/04/2016   Lab Results  Component Value Date   NA 137 09/04/2016   K 4.5 09/04/2016   CL 104 09/04/2016   CO2 24 09/04/2016   BUN 16 09/04/2016   CREATININE 1.07 09/04/2016   GLUCOSE 82 09/04/2016   Lab Results  Component Value Date   INR 1.05 09/12/2016    Assessment/Plan: Patient admitted for DLL/ PLIF L2-S1 . Patient has failed a reasonable attempt at conservative therapy.  I explained the condition and procedure to the patient and answered any questions.  Patient wishes to proceed with procedure as planned. Understands risks/ benefits and typical outcomes of procedure.   Lorik Guo S 09/12/2016 10:30 AM

## 2016-09-12 NOTE — Progress Notes (Signed)
Patient received from OR alert and oriented x4, wife at bedside at this time. Patient is able to make needs known call bell and personal items within reach will continue monitor.

## 2016-09-12 NOTE — Anesthesia Preprocedure Evaluation (Addendum)
Anesthesia Evaluation  Patient identified by MRN, date of birth, ID band Patient awake    Reviewed: Allergy & Precautions, NPO status , Patient's Chart, lab work & pertinent test results  Airway Mallampati: II  TM Distance: >3 FB Neck ROM: Full    Dental  (+) Teeth Intact, Dental Advisory Given   Pulmonary sleep apnea and Continuous Positive Airway Pressure Ventilation ,    breath sounds clear to auscultation       Cardiovascular hypertension, + CAD and + CABG   Rhythm:Regular Rate:Normal     Neuro/Psych    GI/Hepatic   Endo/Other    Renal/GU      Musculoskeletal  (+) Arthritis ,   Abdominal (+) + obese,   Peds  Hematology   Anesthesia Other Findings   Reproductive/Obstetrics                           Anesthesia Physical Anesthesia Plan  ASA: III  Anesthesia Plan: General   Post-op Pain Management:    Induction: Intravenous  PONV Risk Score and Plan: 1 and Ondansetron and Dexamethasone  Airway Management Planned: Oral ETT  Additional Equipment:   Intra-op Plan:   Post-operative Plan: Extubation in OR  Informed Consent: I have reviewed the patients History and Physical, chart, labs and discussed the procedure including the risks, benefits and alternatives for the proposed anesthesia with the patient or authorized representative who has indicated his/her understanding and acceptance.   Dental advisory given  Plan Discussed with: CRNA and Anesthesiologist  Anesthesia Plan Comments:        Anesthesia Quick Evaluation

## 2016-09-12 NOTE — Transfer of Care (Signed)
Immediate Anesthesia Transfer of Care Note  Patient: Frederick Phillips  Procedure(s) Performed: Procedure(s): Posterior Lumbar Interbody Fusion - Lumbar two-Lumbar three - Lumbar three-Lumbar four - Lumbar four -Lumbar five (N/A) Bilateral Lumbar five -Sacral one Hemilaminectomy (Bilateral)  Patient Location: PACU  Anesthesia Type:General  Level of Consciousness: drowsy and patient cooperative  Airway & Oxygen Therapy: Patient Spontanous Breathing and Patient connected to nasal cannula oxygen  Post-op Assessment: Report given to RN, Post -op Vital signs reviewed and stable and Patient moving all extremities X 4  Post vital signs: Reviewed and stable  Last Vitals:  Vitals:   09/12/16 0916 09/12/16 1804  BP: (!) 150/86   Pulse: 66   Resp: 20   Temp: 36.9 C (P) 36.2 C    Last Pain:  Vitals:   09/12/16 0916  TempSrc: Oral         Complications: No apparent anesthesia complications   Dr. Linna Caprice met [place] in PACU during report.

## 2016-09-12 NOTE — Op Note (Signed)
09/12/2016  5:40 PM  PATIENT:  Frederick Phillips  61 y.o. male  PRE-OPERATIVE DIAGNOSIS:  Loss of coronal and sagittal balance, degenerative disc disease, severe lumbar spinal stenosis, spondylolisthesis, back and leg pain  POST-OPERATIVE DIAGNOSIS:  same  PROCEDURE:   1. Decompressive lumbar laminectomy L2-3 L3-4 L4-5 and L5-S1 requiring more work than would be required for a simple exposure of the disk for PLIF in order to adequately decompress the neural elements and address the spinal stenosis, note that the L5-S1 decompression is a level separate from a PLIF level 2. Posterior lumbar interbody fusion L2-3 L3-4 L4-5 using PEEK interbody cages packed with morcellized allograft and autograft 3. Posterior fixation L2-L5 inclusive using ATEC cortical pedicle screws.  4. Intertransverse arthrodesis L2-L5 using morcellized autograft and allograft.  SURGEON:  Sherley Bounds, MD  ASSISTANTS: Dr. Arnoldo Morale  ANESTHESIA:  General  EBL: 800 ml with 350 mL given back as Cell Saver  Total I/O In: 9470 [I.V.:2600; Blood:350; IV Piggyback:500] Out: 1260 [Urine:460; Blood:800]  BLOOD ADMINISTERED:none  DRAINS: none   INDICATION FOR PROCEDURE: This patient presented with severe back and leg pain with a feeling of weakness and numbness in the legs. Imaging revealed severe spinal stenosis L2-3 L3-4 L4-5 and L5-S1 with retrolisthesis, loss of sagittal and coronal balance, spondylolisthesis L2-3, previous surgery L4-5. The patient tried a reasonable attempt at conservative medical measures without relief. I recommended decompression and instrumented fusion to address the stenosis as well as the segment once stability.  Patient understood the risks, benefits, and alternatives and potential outcomes and wished to proceed.  PROCEDURE DETAILS:  The patient was brought to the operating room. After induction of generalized endotracheal anesthesia the patient was rolled into the prone position on chest rolls and  all pressure points were padded. The patient's lumbar region was cleaned and then prepped with DuraPrep and draped in the usual sterile fashion. Anesthesia was injected and then a dorsal midline incision was made and carried down to the lumbosacral fascia. The fascia was opened and the paraspinous musculature was taken down in a subperiosteal fashion to expose L2-L5 S1. A self-retaining retractor was placed. Intraoperative fluoroscopy confirmed my level, and I started with placement of the L2 cortical pedicle screws. The pedicle screw entry zones were identified utilizing surface landmarks and  AP and lateral fluoroscopy. I scored the cortex with the high-speed drill and then used the hand drill to drill an upward and outward direction into the pedicle. I then tapped line to line, and the tap was also monitored. I then placed a 5.5 x 35 mm cortical pedicle screw into the pedicles of L2 bilaterally. I then turned my attention to the decompression and complete lumbar laminectomies, hemi- facetectomies, and foraminotomies were performed at L2-3, L3-4, L4-5, and L5-S1. The patient had significant spinal stenosis and this required more work than would be required for a simple exposure of the disc for posterior lumbar interbody fusion. Much more generous decompression was undertaken in order to adequately decompress the neural elements and address the patient's leg pain. He is exiting decompression was done at all levels except L5-S1 where bilateral hemilaminectomies, medial facetectomies and foraminotomies were performed instead of hemi-facetectomies. The yellow ligament was removed to expose the underlying dura and nerve roots, and generous foraminotomies were performed to adequately decompress the neural elements. Both the exiting and traversing nerve roots were decompressed on both sides until a coronary dilator passed easily along the nerve roots. Once the decompression was complete, I turned my attention  to the  posterior lower lumbar interbody fusion. The epidural venous vasculature was coagulated and cut sharply. Disc space was incised and the initial discectomy was performed with pituitary rongeurs. The disc space was distracted with sequential distractors to a height of 8 mm at L4-5, 12 mm at L3-4, an 11 mm at L2-3.Marland Kitchen We then used a series of scrapers and shavers to prepare the endplates for fusion. The midline was prepared with Epstein curettes. Once the complete discectomy was finished, we packed an appropriate sized peek interbody cage with local autograft and morcellized allograft, gently retracted the nerve root, and tapped the cage into position at L2-3 L3-4 and L4-5.  The midline between the cages was packed with morselized autograft and allograft. We then turned our attention to the placement of the lower pedicle screws. The pedicle screw entry zones were identified utilizing surface landmarks and fluoroscopy. I drilled into each pedicle utilizing the hand drill, and tapped each pedicle with the appropriate tap. We palpated with a ball probe to assure no break in the cortex. We then placed 5.5 x 35 mm cortical pedicle screws into the pedicles bilaterally at L3 and L4 with 40 mm screws at L5. We then decorticated the transverse processes and laid a mixture of morcellized autograft and allograft out over these to perform intertransverse arthrodesis at L2-L5. We then placed lordotic rods into the multiaxial screw heads of the pedicle screws L2-L5 and locked these in position with the locking caps and anti-torque device. We then checked our construct with AP and lateral fluoroscopy. Irrigated with copious amounts of bacitracin-containing saline solution. Inspected the nerve roots once again to assure adequate decompression, lined to the dura with Gelfoam, placed a medium Hemovac drains was stab incision, and closed the muscle and the fascia with 0 Vicryl. Closed the subcutaneous tissues with 2-0 Vicryl and  subcuticular tissues with 3-0 Vicryl. The skin was closed with benzoin and Steri-Strips. Dressing was then applied, the patient was awakened from general anesthesia and transported to the recovery room in stable condition. At the end of the procedure all sponge, needle and instrument counts were correct.   PLAN OF CARE: admit to inpatient  PATIENT DISPOSITION:  PACU - hemodynamically stable.   Delay start of Pharmacological VTE agent (>24hrs) due to surgical blood loss or risk of bleeding:  yes

## 2016-09-12 NOTE — Anesthesia Procedure Notes (Signed)
Procedure Name: Intubation Date/Time: 09/12/2016 10:51 AM Performed by: Garrison Columbus T Pre-anesthesia Checklist: Patient identified, Emergency Drugs available, Suction available and Patient being monitored Patient Re-evaluated:Patient Re-evaluated prior to inductionOxygen Delivery Method: Circle System Utilized Preoxygenation: Pre-oxygenation with 100% oxygen Intubation Type: IV induction Ventilation: Mask ventilation without difficulty and Oral airway inserted - appropriate to patient size Laryngoscope Size: Sabra Heck and 2 Grade View: Grade II Tube type: Oral Tube size: 7.5 mm Number of attempts: 1 Airway Equipment and Method: Stylet and Oral airway Placement Confirmation: ETT inserted through vocal cords under direct vision,  positive ETCO2 and breath sounds checked- equal and bilateral Secured at: 22 cm Tube secured with: Tape Dental Injury: Teeth and Oropharynx as per pre-operative assessment

## 2016-09-12 NOTE — Procedures (Signed)
Patient refused CPAP for the night.  Patient is aware to ask RN to call Respiratory if he changes his mind. 

## 2016-09-13 ENCOUNTER — Encounter (HOSPITAL_COMMUNITY): Payer: Self-pay | Admitting: Neurological Surgery

## 2016-09-13 LAB — CBC
HEMATOCRIT: 36.7 % — AB (ref 39.0–52.0)
HEMOGLOBIN: 12.8 g/dL — AB (ref 13.0–17.0)
MCH: 28.8 pg (ref 26.0–34.0)
MCHC: 34.9 g/dL (ref 30.0–36.0)
MCV: 82.7 fL (ref 78.0–100.0)
Platelets: 91 10*3/uL — ABNORMAL LOW (ref 150–400)
RBC: 4.44 MIL/uL (ref 4.22–5.81)
RDW: 14.4 % (ref 11.5–15.5)
WBC: 14.9 10*3/uL — ABNORMAL HIGH (ref 4.0–10.5)

## 2016-09-13 MED ORDER — METHOCARBAMOL 500 MG PO TABS: 500.0000 mg | ORAL_TABLET | Freq: Four times a day (QID) | ORAL | 2 refills | Status: DC | PRN

## 2016-09-13 MED ORDER — WHITE PETROLATUM GEL
Status: AC
  Filled 2016-09-13: qty 1

## 2016-09-13 MED ORDER — OXYCODONE HCL 5 MG PO TABS: 5.0000 mg | ORAL_TABLET | ORAL | 0 refills | Status: DC | PRN

## 2016-09-13 MED FILL — Gelatin Absorbable MT Powder: OROMUCOSAL | Qty: 1 | Status: AC

## 2016-09-13 MED FILL — Sodium Chloride IV Soln 0.9%: INTRAVENOUS | Qty: 2000 | Status: AC

## 2016-09-13 MED FILL — Heparin Sodium (Porcine) Inj 1000 Unit/ML: INTRAMUSCULAR | Qty: 30 | Status: AC

## 2016-09-13 MED FILL — Thrombin For Soln 5000 Unit: CUTANEOUS | Qty: 5000 | Status: AC

## 2016-09-13 NOTE — Progress Notes (Signed)
Head to toe assessment done, no pressure ulcer noted, honey comb dressing intact to mid low back with no bleeding noted, Hemovac drain also intact to left mid back. Patient alert, oriented and cooperative with care will continue monitor.

## 2016-09-13 NOTE — Discharge Summary (Signed)
Physician Discharge Summary  Patient ID: Frederick Phillips MRN: [id number] DOB/AGE: 10-01-1955 61 y.o.  Admit date: 09/12/2016 Discharge date: 09/13/2016  Admission Diagnoses: stenosis, DDD, instability    Discharge Diagnoses: same   Discharged Condition: good  Hospital Course: The patient was admitted on 09/12/2016 and taken to the operating room where the patient underwent PLIF. The patient tolerated the procedure well and was taken to the recovery room and then to the floor in stable condition. The hospital course was routine. There were no complications. The wound remained clean dry and intact. Pt had appropriate back soreness. No complaints of leg pain or new N/T/W. The patient remained afebrile with stable vital signs, and tolerated a regular diet. The patient continued to increase activities, and pain was well controlled with oral pain medications.   Consults: None  Significant Diagnostic Studies:  Results for orders placed or performed during the hospital encounter of 09/12/16  Protime-INR  Result Value Ref Range   Prothrombin Time 13.7 11.4 - 15.2 seconds   INR 1.05   CBC  Result Value Ref Range   WBC 14.9 (H) 4.0 - 10.5 K/uL   RBC 4.44 4.22 - 5.81 MIL/uL   Hemoglobin 12.8 (L) 13.0 - 17.0 g/dL   HCT 36.7 (L) 39.0 - 52.0 %   MCV 82.7 78.0 - 100.0 fL   MCH 28.8 26.0 - 34.0 pg   MCHC 34.9 30.0 - 36.0 g/dL   RDW 14.4 11.5 - 15.5 %   Platelets 91 (L) 150 - 400 K/uL    Chest 2 View  Result Date: 09/04/2016 CLINICAL DATA:  Spondylolisthesis.  History of hypertension.  Preop. EXAM: CHEST  2 VIEW COMPARISON:  None. FINDINGS: Normal heart size. Previous median sternotomy for CABG. Coronary stent noted in the lateral projection. Unremarkable aortic contours. There is no edema, consolidation, effusion, or pneumothorax. No acute osseous finding. IMPRESSION: 1. No evidence of active disease. 2. Status post CABG and coronary stenting. Electronically Signed   By: Monte Fantasia M.D.   On:  09/04/2016 09:37   Dg Lumbar Spine 2-3 Views  Result Date: 09/12/2016 CLINICAL DATA:  PLIF L2-L5 EXAM: DG C-ARM GT 120 MIN; LUMBAR SPINE - 2-3 VIEW CONTRAST:  None FLUOROSCOPY TIME:  Fluoroscopy Time:  2 minutes 39 seconds Radiation Exposure Index (if provided by the fluoroscopic device): None Number of Acquired Spot Images: 4 COMPARISON:  Lumbar spine radiographs 08/06/2016 FINDINGS: Previous exam labeled with 5 lumbar vertebra. Images demonstrate placement of BILATERAL pedicle screws and bars at L2-L5. Intervening disc prostheses at the L2-L3, L3-L4, and L4-L5 disc spaces. Bones appear demineralized with scattered degenerative disc disease changes greatest at L4-L5. IMPRESSION: Posterior fusion L2-L5. Electronically Signed   By: Lavonia Dana M.D.   On: 09/12/2016 17:35   Dg C-arm Gt 120 Min  Result Date: 09/12/2016 CLINICAL DATA:  PLIF L2-L5 EXAM: DG C-ARM GT 120 MIN; LUMBAR SPINE - 2-3 VIEW CONTRAST:  None FLUOROSCOPY TIME:  Fluoroscopy Time:  2 minutes 39 seconds Radiation Exposure Index (if provided by the fluoroscopic device): None Number of Acquired Spot Images: 4 COMPARISON:  Lumbar spine radiographs 08/06/2016 FINDINGS: Previous exam labeled with 5 lumbar vertebra. Images demonstrate placement of BILATERAL pedicle screws and bars at L2-L5. Intervening disc prostheses at the L2-L3, L3-L4, and L4-L5 disc spaces. Bones appear demineralized with scattered degenerative disc disease changes greatest at L4-L5. IMPRESSION: Posterior fusion L2-L5. Electronically Signed   By: Lavonia Dana M.D.   On: 09/12/2016 17:35    Antibiotics:  Anti-infectives  Start     Dose/Rate Route Frequency Ordered Stop   09/12/16 2230  ceFAZolin (ANCEF) IVPB 2g/100 mL premix     2 g 200 mL/hr over 30 Minutes Intravenous Every 8 hours 09/12/16 2047 09/13/16 0714   09/12/16 1704  vancomycin (VANCOCIN) powder  Status:  Discontinued       As needed 09/12/16 1704 09/12/16 1800   09/12/16 1515  ceFAZolin (ANCEF) 3 g in  dextrose 5 % 50 mL IVPB  Status:  Discontinued     3 g 130 mL/hr over 30 Minutes Intravenous On call to O.R. 09/12/16 1507 09/12/16 2026   09/12/16 1232  bacitracin 50,000 Units in sodium chloride irrigation 0.9 % 500 mL irrigation  Status:  Discontinued       As needed 09/12/16 1233 09/12/16 1800   09/12/16 1030  ceFAZolin (ANCEF) 3 g in dextrose 5 % 50 mL IVPB     3 g 130 mL/hr over 30 Minutes Intravenous On call to O.R. 09/11/16 1136 09/12/16 1520      Discharge Exam: Blood pressure (!) 108/49, pulse 74, temperature 97.6 F (36.4 C), temperature source Oral, resp. rate 18, height 6\' 1"  (1.854 m), weight 129.5 kg (285 lb 8 oz), SpO2 96 %. Neurologic: Grossly normal except old foot drop L Dressing dry  Discharge Medications:   Allergies as of 09/13/2016      Reactions   Losartan Shortness Of Breath   Amlodipine Other (See Comments)   Fatigue    Atorvastatin Diarrhea      Medication List    TAKE these medications   aspirin EC 81 MG tablet Take 81 mg by mouth daily.   fexofenadine 180 MG tablet Commonly known as:  ALLEGRA Take 180 mg by mouth daily.   lisinopril 40 MG tablet Commonly known as:  PRINIVIL,ZESTRIL Take 40 mg by mouth daily.   methocarbamol 500 MG tablet Commonly known as:  ROBAXIN Take 1 tablet (500 mg total) by mouth every 6 (six) hours as needed for muscle spasms.   oxyCODONE 5 MG immediate release tablet Commonly known as:  Oxy IR/ROXICODONE Take 1-2 tablets (5-10 mg total) by mouth every 4 (four) hours as needed for breakthrough pain.   rivaroxaban 10 MG Tabs tablet Commonly known as:  XARELTO Take 10 mg by mouth every morning.   simvastatin 10 MG tablet Commonly known as:  ZOCOR Take 10 mg by mouth every morning.            Durable Medical Equipment        Start     Ordered   09/12/16 2048  DME Walker rolling  Once    Question:  Patient needs a walker to treat with the following condition  Answer:  S/P lumbar fusion   09/12/16 2047    09/12/16 2048  DME 3 n 1  Once     09/12/16 2047      Disposition: home   Final Dx: PLIF L2-5  Discharge Instructions     Remove dressing in 72 hours    Complete by:  As directed    Call MD for:  difficulty breathing, headache or visual disturbances    Complete by:  As directed    Call MD for:  persistant nausea and vomiting    Complete by:  As directed    Call MD for:  redness, tenderness, or signs of infection (pain, swelling, redness, odor or green/yellow discharge around incision site)    Complete by:  As directed  Call MD for:  severe uncontrolled pain    Complete by:  As directed    Call MD for:  temperature >100.4    Complete by:  As directed    Diet - low sodium heart healthy    Complete by:  As directed    Increase activity slowly    Complete by:  As directed       Follow-up Information    Eustace Moore, MD Follow up in 2 week(s).   Specialty:  Neurosurgery Contact information: [address] [place] [address] [phone]            Signed: Eustace Moore 09/13/2016, 7:28 AM

## 2016-09-13 NOTE — Evaluation (Signed)
Physical Therapy Evaluation and D/C Patient Details Name: Frederick Phillips MRN: [id number] DOB: February 09, 1956 Today's Date: 09/13/2016   History of Present Illness  Pt is a 61 y/o M s/p lumbar laminectomy L2-3 L3-4 L4-5 and L5-S1; PMHx includes arthritis, CAD, HTN, DVT R LE  Clinical Impression  Pt admitted with above diagnosis. Pt currently without significant functional limitations and is supervision for all mobility.  Pt did not need any help and will use RW initially on d/c home.  Has family to provide Supervision prn.  All education regarding brace and back precautions completed. Would benefit from 3N1 for commode as he is tall.  Pt does not need further skilled PT  Follow Up Recommendations No PT follow up    Equipment Recommendations  3in1 (PT) - pt is 6 feet 1 inch.    Recommendations for Other Services       Precautions / Restrictions Precautions Precautions: Fall;Back Precaution Booklet Issued: Yes (comment) Precaution Comments: back precautions handout issued and reviewed  Required Braces or Orthoses: Spinal Brace Spinal Brace: Lumbar corset;Applied in sitting position Restrictions Weight Bearing Restrictions: No      Mobility  Bed Mobility Overal bed mobility: Needs Assistance Bed Mobility: Rolling;Sidelying to Sit Rolling: Supervision Sidelying to sit: Supervision       General bed mobility comments: Pt completed log roll correctly.    Transfers Overall transfer level: Needs assistance Equipment used: None;Rolling walker (2 wheeled) Transfers: Sit to/from Stand Sit to Stand: Supervision         General transfer comment: No assist needed.  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  Pt compensates by incr hip and knee flexion.  Pt encouaraged to use RW intially for comfort and safety and pt agrees.  Pt was able to ambulate in hallway with RW with good safety.  Ambulated without RW and still no LOB but pt feels best with RW presently.  Has a RW at home.   Stairs Stairs:  (declined to practice as he said he will have no problems. )          Wheelchair Mobility    Modified Rankin (Stroke Patients Only)       Balance Overall balance assessment: Needs assistance Sitting-balance support: Feet supported;No upper extremity supported Sitting balance-Leahy Scale: Good     Standing balance support: No upper extremity supported;During functional activity Standing balance-Leahy Scale: Fair Standing balance comment: no issues with static stance             High level balance activites: Direction changes;Turns;Sudden stops High Level Balance Comments: supervision with above.              Pertinent Vitals/Pain Pain Assessment: Faces Pain Score: 3  Faces Pain Scale: Hurts a little bit Pain Location: back  Pain Descriptors / Indicators: Aching;Sore Pain Intervention(s): Limited activity within patient's tolerance;Monitored during session;Premedicated before session;Repositioned    Home Living Family/patient expects to be discharged to:: Private residence Living Arrangements: Spouse/significant other Available Help at Discharge: Family;Available 24 hours/day (initially ) Type of Home: House Home Access: Stairs to enter Entrance Stairs-Rails: None Entrance Stairs-Number of Steps: 3 Home Layout: Able to live on main level with bedroom/bathroom;Two level (basement )  Home Equipment: [place] - 2 wheels;Cane - single point;Crutches;Shower seat      Prior Function Level of Independence: Independent          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, working     Journalist, newspaper   Dominant Hand: Right    Extremity/Trunk Assessment   Upper Extremity Assessment Upper Extremity Assessment: Defer to OT evaluation    Lower Extremity Assessment Lower Extremity Assessment: Overall WFL for tasks assessed    Cervical / Trunk Assessment Cervical / Trunk Assessment: Normal  Communication   Communication: No difficulties  Cognition Arousal/Alertness: Awake/alert Behavior During Therapy: WFL for tasks assessed/performed Overall Cognitive Status: Within Functional Limits for tasks assessed                                        General Comments General comments (skin integrity, edema, etc.): Pt was able to don and doff brace without assist. Discussed not to sit over 30-45 min at a time.     Exercises     Assessment/Plan    PT Assessment Patent does not need any further PT services  PT Problem List         PT Treatment Interventions      PT Goals (Current goals can be found in the Care Plan section)  Acute Rehab PT Goals Patient Stated Goal: to get back home  PT Goal Formulation: All assessment and education complete, DC therapy    Frequency     Barriers to discharge        Co-evaluation               AM-PAC PT "6 Clicks" Daily Activity  Outcome Measure Difficulty turning over in bed (including adjusting bedclothes, sheets and blankets)?: None Difficulty moving from lying on back to sitting on the side of the bed? : None Difficulty sitting down on and standing up from a chair with arms (e.g., wheelchair, bedside commode, etc,.)?: None Help needed moving to and from a bed to chair (including a wheelchair)?: None Help needed walking in hospital room?: None Help needed climbing 3-5 steps with a railing? : A Little 6 Click Score: 23    End of Session Equipment Utilized During Treatment: Gait belt;Back brace Activity Tolerance: Patient tolerated treatment well Patient  left: in chair;with call bell/phone within reach;with family/visitor present Nurse Communication: Mobility status PT Visit Diagnosis: Muscle weakness (generalized) (M62.81);Pain Pain - part of body:  (back)    Time: 4481-8563 PT Time Calculation (min) (ACUTE ONLY): 20 min   Charges:   PT Evaluation $PT Eval Low Complexity: 1 Procedure     PT G Codes:        Frederick Phillips,PT Acute Rehabilitation [phone] (pager)   Denice Paradise 09/13/2016, 2:27 PM

## 2016-09-13 NOTE — Evaluation (Signed)
Occupational Therapy Evaluation Patient Details Name: Frederick Phillips MRN: [id number] DOB: 21-Nov-1955 Today's Date: 09/13/2016    History of Present Illness Pt is a 61 y/o M s/p lumbar laminectomy L2-3 L3-4 L4-5 and L5-S1; PMHx includes arthritis, CAD, HTN, DVT R LE   Clinical Impression   This 61 y/o M presents with the above. At baseline Pt is independent with ADLs and functional mobility, was working and driving. Pt currently requires MinGuard for ADLs, intermittent MinA for LB ADLs, and MinGuard assist for functional mobility without assistive device. Pt will have initial 24 hr assist from family after return home. Educated Pt on back precautions, DME, AE, and compensatory techniques for safely completing ADLs after return home with Pt verbalizing and demonstrating understanding throughout. All questions answered. No additional acute OT needs identified at this time. Will sign off.     Follow Up Recommendations  Supervision/Assistance - 24 hour;No OT follow up    Equipment Recommendations  None recommended by OT           Precautions / Restrictions Precautions Precautions: Fall;Back Precaution Booklet Issued: Yes (comment) Precaution Comments: back precautions handout issued and reviewed  Required Braces or Orthoses: Spinal Brace Spinal Brace: Lumbar corset Restrictions Weight Bearing Restrictions: No      Mobility Bed Mobility               General bed mobility comments: Pt OOB in recliner, verbally reviewed log roll technique for completing bed mobility   Transfers Overall transfer level: Needs assistance Equipment used: None Transfers: Sit to/from Stand Sit to Stand: Min guard         General transfer comment: close guard for safety     Balance Overall balance assessment: Needs assistance Sitting-balance support: Feet supported Sitting balance-Leahy Scale: Good     Standing balance support: No upper extremity supported Standing balance-Leahy Scale:  Fair                             ADL either performed or assessed with clinical judgement   ADL Overall ADL's : Needs assistance/impaired Eating/Feeding: Independent;Sitting   Grooming: Wash/dry hands;Min guard;Standing   Upper Body Bathing: Min guard;Sitting   Lower Body Bathing: Min guard;Sit to/from stand   Upper Body Dressing : Set up;Sitting   Lower Body Dressing: Min guard;Sit to/from stand;Adhering to back precautions Lower Body Dressing Details (indicate cue type and reason): Pt demonstrating figure 4 technique Toilet Transfer: Comfort height toilet;Grab bars;Minimal assistance Toilet Transfer Details (indicate cue type and reason): educated Pt on use of 3:1 BSC for toilet transfer with Pt reporting feeling comfortable completing transfer without DME at home, declining use of 3:1 and demonstrating toilet transfer without 3:1 this session with adherence to back precautions and MinA for steadying/safety  Toileting- Clothing Manipulation and Hygiene: Min guard;Sit to/from stand;Adhering to back precautions       Functional mobility during ADLs: Min guard General ADL Comments: Pt completing room level functional mobility with MinGuard assist, educated on use of DME at home, AE and compensatory techniques for completing ADLs while adhering to back precautions     Vision Baseline Vision/History: Wears glasses Wears Glasses: Distance only                  Pertinent Vitals/Pain Pain Assessment: 0-10 Pain Score: 3  Pain Location: back  Pain Descriptors / Indicators: Aching;Sore Pain Intervention(s): Limited activity within patient's tolerance;Monitored during session;Premedicated before session  Hand Dominance Right   Extremity/Trunk Assessment Upper Extremity Assessment Upper Extremity Assessment: Overall WFL for tasks assessed   Lower Extremity Assessment Lower Extremity Assessment: Defer to PT evaluation   Cervical / Trunk Assessment Cervical /  Trunk Assessment: Normal   Communication Communication Communication: No difficulties   Cognition Arousal/Alertness: Awake/alert Behavior During Therapy: WFL for tasks assessed/performed Overall Cognitive Status: Within Functional Limits for tasks assessed                                     General Comments                  Home Living Family/patient expects to be discharged to:: Private residence Living Arrangements: Spouse/significant other Available Help at Discharge: Family;Available 24 hours/day (initially ) Type of Home: House Home Access: Stairs to enter [employer] of Steps: 3 Entrance Stairs-Rails: None Home Layout: Able to live on main level with bedroom/bathroom;Two level (basement )   Alternate Level Stairs-Rails: Right Bathroom Shower/Tub: Occupational psychologist: Standard Bathroom Accessibility: Yes How Accessible: Accessible via walker Home Equipment: [place] - 2 wheels;Cane - single point;Crutches;Shower seat          Prior Functioning/Environment Level of Independence: Independent        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, working        OT Problem List: Decreased strength;Decreased knowledge of precautions;Decreased knowledge of use of DME or AE            OT Goals(Current goals can be found in the care plan section) Acute Rehab OT Goals Patient Stated Goal: to get back home  OT Goal Formulation: With patient                                 AM-PAC PT "6 Clicks" Daily Activity     Outcome Measure Help from another person eating meals?: None Help from another person taking care of personal grooming?: A Little Help from another person toileting, which includes using toliet, bedpan, or urinal?: A Little Help from another person bathing (including washing, rinsing, drying)?: A Little Help from another person to put on and taking off regular upper body clothing?: None Help from another person to put on and  taking off regular lower body clothing?: A Little 6 Click Score: 20   End of Session Equipment Utilized During Treatment: Gait belt;Back brace Nurse Communication: Mobility status  Activity Tolerance: Patient tolerated treatment well Patient left: in chair;with call bell/phone within reach  OT Visit Diagnosis: Muscle weakness (generalized) (M62.81)                Time: 0240-9735 OT Time Calculation (min): 33 min Charges:  OT General Charges $OT Visit: 1 Procedure OT Evaluation $OT Eval Low Complexity: 1 Procedure OT Treatments $Self Care/Home Management : 8-22 mins G-Codes:     Lou Cal, OT Pager [phone] 09/13/2016   Raymondo Band 09/13/2016, 10:56 AM

## 2016-09-13 NOTE — Care Management Note (Addendum)
Case Management Note  Patient Details  Name: Frederick Phillips MRN: [id number] Date of Birth: 1955-07-18  Subjective/Objective:           Stenosis, DDD, instability         Action/Plan: Discharge Planning: NCM spoke to pt at bedside. States dtr and wife will be at home to assist with care. Pt states he has RW and shower chair at home.  Contacted [facility] DME rep for 3n1 bedside commode for home.    PCP  Joneen Boers MD   Expected Discharge Date:  09/13/16               Expected Discharge Plan:  Home/Self Care  In-House Referral:  NA  Discharge planning Services  CM Consult  Post Acute Care Choice:  NA Choice offered to:  NA  DME Arranged:  N/A DME Agency:  NA  HH Arranged:  NA HH Agency:  NA  Status of Service:  Completed, signed off  If discussed at [place] of Stay Meetings, dates discussed:    Additional Comments:  Erenest Rasher, RN 09/13/2016, 10:48 AM

## 2016-09-13 NOTE — Progress Notes (Signed)
Pt discharged at this time with wife taking all personal belongings. Surgical dressing changed, clean dry and intact. Hemovac removed 50 ml emptied with dry dressing. Pt denies pain or discomfort. Discharge instructions provided with prescriptions with verbal understanding. Pt will follow up with MD

## 2016-09-14 NOTE — Anesthesia Postprocedure Evaluation (Signed)
Anesthesia Post Note  Patient: Frederick Phillips  Procedure(s) Performed: Procedure(s) (LRB): Posterior Lumbar Interbody Fusion - Lumbar two-Lumbar three - Lumbar three-Lumbar four - Lumbar four -Lumbar five (N/A) Bilateral Lumbar five -Sacral one Hemilaminectomy (Bilateral)     Patient location during evaluation: PACU Anesthesia Type: General Level of consciousness: awake, awake and alert and oriented Pain management: pain level controlled Respiratory status: spontaneous breathing, nonlabored ventilation and respiratory function stable Cardiovascular status: blood pressure returned to baseline Anesthetic complications: no    Last Vitals:  Vitals:   09/13/16 0528 09/13/16 0854  BP: (!) 108/49 (!) 146/72  Pulse: 74 94  Resp: 18 20  Temp: 36.4 C 36.9 C    Last Pain:  Vitals:   09/13/16 1019  TempSrc:   PainSc: 2                  Rajanae Mantia COKER

## 2016-10-28 ENCOUNTER — Other Ambulatory Visit (HOSPITAL_COMMUNITY): Payer: Self-pay | Admitting: Neurological Surgery

## 2016-10-28 DIAGNOSIS — R2243 Localized swelling, mass and lump, lower limb, bilateral: Secondary | ICD-10-CM

## 2016-10-28 DIAGNOSIS — M48062 Spinal stenosis, lumbar region with neurogenic claudication: Secondary | ICD-10-CM | POA: Insufficient documentation

## 2016-10-29 ENCOUNTER — Ambulatory Visit (HOSPITAL_COMMUNITY)
Admission: RE | Admit: 2016-10-29 | Discharge: 2016-10-29 | Disposition: A | Discharge status: Home / Self Care | Payer: 59 | Source: Ambulatory Visit | Attending: Vascular Surgery | Admitting: Vascular Surgery

## 2016-10-29 DIAGNOSIS — R2243 Localized swelling, mass and lump, lower limb, bilateral: Secondary | ICD-10-CM | POA: Diagnosis present

## 2016-10-29 DIAGNOSIS — I82431 Acute embolism and thrombosis of right popliteal vein: Secondary | ICD-10-CM | POA: Insufficient documentation

## 2016-11-14 ENCOUNTER — Telehealth: Payer: Self-pay | Admitting: Podiatry

## 2016-11-14 ENCOUNTER — Encounter: Payer: Self-pay | Admitting: Podiatry

## 2016-11-14 ENCOUNTER — Ambulatory Visit (INDEPENDENT_AMBULATORY_CARE_PROVIDER_SITE_OTHER): Payer: 59 | Admitting: Podiatry

## 2016-11-14 ENCOUNTER — Encounter: Payer: Self-pay | Admitting: Neurology

## 2016-11-14 ENCOUNTER — Telehealth: Payer: Self-pay | Admitting: *Deleted

## 2016-11-14 ENCOUNTER — Ambulatory Visit (INDEPENDENT_AMBULATORY_CARE_PROVIDER_SITE_OTHER): Payer: 59

## 2016-11-14 VITALS — BP 128/67 | HR 64 | Resp 16

## 2016-11-14 DIAGNOSIS — M21372 Foot drop, left foot: Secondary | ICD-10-CM | POA: Diagnosis not present

## 2016-11-14 DIAGNOSIS — D751 Secondary polycythemia: Secondary | ICD-10-CM | POA: Insufficient documentation

## 2016-11-14 DIAGNOSIS — N200 Calculus of kidney: Secondary | ICD-10-CM | POA: Insufficient documentation

## 2016-11-14 DIAGNOSIS — M779 Enthesopathy, unspecified: Secondary | ICD-10-CM | POA: Diagnosis not present

## 2016-11-14 DIAGNOSIS — G629 Polyneuropathy, unspecified: Secondary | ICD-10-CM | POA: Diagnosis not present

## 2016-11-14 DIAGNOSIS — M545 Low back pain, unspecified: Secondary | ICD-10-CM | POA: Insufficient documentation

## 2016-11-14 DIAGNOSIS — G4733 Obstructive sleep apnea (adult) (pediatric): Secondary | ICD-10-CM | POA: Insufficient documentation

## 2016-11-14 DIAGNOSIS — R7301 Impaired fasting glucose: Secondary | ICD-10-CM | POA: Insufficient documentation

## 2016-11-14 DIAGNOSIS — I10 Essential (primary) hypertension: Secondary | ICD-10-CM | POA: Insufficient documentation

## 2016-11-14 DIAGNOSIS — Z889 Allergy status to unspecified drugs, medicaments and biological substances status: Secondary | ICD-10-CM | POA: Insufficient documentation

## 2016-11-14 DIAGNOSIS — D696 Thrombocytopenia, unspecified: Secondary | ICD-10-CM | POA: Insufficient documentation

## 2016-11-14 MED ORDER — AMMONIUM LACTATE 12 % EX LOTN: 1.0000 "application " | TOPICAL_LOTION | Freq: Every day | CUTANEOUS | 5 refills | Status: DC

## 2016-11-14 MED ORDER — LACTIC ACID E 10-3500 %-UNT/30GM EX CREA: 1.0000 "application " | TOPICAL_CREAM | Freq: Every day | CUTANEOUS | 3 refills | Status: DC

## 2016-11-14 NOTE — Telephone Encounter (Signed)
Sent in different Rx of the same ingredients for pharmacy to fill

## 2016-11-14 NOTE — Telephone Encounter (Addendum)
-----   Message from [place] sent at 11/14/2016  9:13 AM EDT ----- Regarding: Nerve Conduction  Order is in! [facility]. Thanks!! Orders faxed to [facility].

## 2016-11-14 NOTE — Progress Notes (Signed)
   Subjective:    Patient ID: Frederick Phillips, male    DOB: 1955/11/07, 61 y.o.   MRN: [id number]  HPI: He presents today on referral from Dr. Sherley Bounds with a chief complaint of numbness, hypersensitivity and an soft sensation for the past several weeks. He states that prior to his back surgery he was unable to move his toes on his right foot. He feels the back surgery was a success and was hoping to regain more sensation in his bilateral foot.  His left lower extremity was fractured resulting open reduction internal fixation and a resultant compartment syndrome which left him with a foot drop.  He relates some sciatica-type symptoms currently from his lower right back to his right thigh. He states that he has a very strange sensation of tightness in both feet but in an sensation that wraps from posterior medial to distal lateral dorsally.  He denies a history of diabetes alcoholism pernicious anemia. Denies numbness in his hands. He states that he does not feel as stable ambulating over the past few weeks. He does not feel that he is a fall risk but has noticed a change.    Review of Systems  Musculoskeletal: Positive for back pain.  Neurological: Positive for weakness and numbness.  Hematological: Bruises/bleeds easily.  All other systems reviewed and are negative.      Objective:   Physical Exam: Vital signs are stable he is alert and oriented 3 pulses are strongly palpable neurologic sensorium is diminished per Semmes-Weinstein monofilament plantar aspect of the bilateral foot. Sharp sensation is intact but light touch is completely diminished deep pain sensation is intact. Deep tendon reflexes are not elicitable and muscle strength is very weak on dorsiflexion bilaterally 2 out of 4 on the right foot 0 out of 4 on the left foot. Orthopedic evaluation demonstrates all joints distal to the ankle for range of motion without crepitation. I see no major osseous abnormalities. Radiographs  taken today do not demonstrate any type of osseus abnormalities other than open reduction internal fixation of the left leg.    Assessment & Plan:  Assessment: I do feel that he has peripheral neuropathy more than likely associated with back trauma or surgery. This could be an inherited type of neuropathy however we are obviously looking at motor neuropathy as well as sensory neuropathy.  Plan: Discussed etiology pathology conservative versus surgical therapies at this point I feel it best to have nerve conduction velocity exam performed and we will set that up. We will make sure that Dr. Sherley Bounds receives copy of this as well. I am not sure that there is anything on podiatric and that we can do to alleviate his symptoms. However I did discuss bounds training and gait training with him.

## 2016-11-14 NOTE — Telephone Encounter (Signed)
Frederick Phillips with [facility] just called about prescription they received. They do not know what it is for. Requested a call back.

## 2016-11-14 NOTE — Addendum Note (Signed)
Addended by: Rip Harbour on: 11/14/2016 01:32 PM   Modules accepted: Orders

## 2016-11-15 ENCOUNTER — Other Ambulatory Visit: Payer: Self-pay | Admitting: *Deleted

## 2016-11-15 DIAGNOSIS — M21372 Foot drop, left foot: Secondary | ICD-10-CM

## 2016-12-10 ENCOUNTER — Ambulatory Visit (INDEPENDENT_AMBULATORY_CARE_PROVIDER_SITE_OTHER): Payer: 59 | Admitting: Neurology

## 2016-12-10 DIAGNOSIS — M21372 Foot drop, left foot: Secondary | ICD-10-CM

## 2016-12-10 DIAGNOSIS — M5417 Radiculopathy, lumbosacral region: Secondary | ICD-10-CM

## 2016-12-10 DIAGNOSIS — G629 Polyneuropathy, unspecified: Secondary | ICD-10-CM

## 2016-12-10 NOTE — Telephone Encounter (Addendum)
-----   Message from Garrel Ridgel, [place] sent at 12/10/2016 11:59 AM EDT ----- Let him know that his test did show neuropathy.  Drs Milinda Pointer and jones are discussing your results. 12/11/2016-I informed pt of Dr. Stephenie Acres current status on the results and treatment. ----- Message ----- From: Alda Berthold, DO Sent: 12/10/2016  10:16 AM To: Garrel Ridgel, DPM. Left message for pt to call for information concerning test.

## 2016-12-10 NOTE — Procedures (Signed)
[facility]  [address], [address]  [place], [place] [address] Tel: [phone] Fax:  [phone] Test Date:  12/10/2016  Patient: Frederick Phillips DOB: 07/07/1955 Physician: Narda Amber, DO  Sex: Male Height: 6\' 1"  Ref Phys: Garrel Ridgel, DPM  ID#: [id number] Temp: 33.8C Technician:    Patient Complaints: This is a 61 year old gentleman with history of left lower leg compartment syndrome s/p surgery with residual left foot drop and numbness and lumbar decompression and fusion at L2-S1 referred for evaluation due to new numbness and weakness of the right foot.   NCV & EMG Findings: Extensive electrodiagnostic testing of the right lower extremity and additional studies of the left shows:  1. Bilateral sural and superficial peroneal sensory responses are absent. 2. Bilateral tibial motor responses are absent. Bilateral peroneal motor responses are markedly reduced at the extensor digitorum brevis with slowed velocity. The peroneal motor response at the right tibialis anterior is within normal limits, and reduced on the left. 3. Tibial H reflex study is absent on the left and prolonged on the right.  4. Chronic motor axon loss changes are seen affecting the right rectus femoris and gluteus medius as well as bilateral tibialis anterior and medial gastrocnemius muscles, with there is also evidence of active denervation. Fasciculation potentials are seen in the bilateral tibialis anterior and gastrocnemius muscles.  Impression: 1. 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  Overall, these findings are severe in degree electrically and worse on the left. 2. 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   ___________________________ Narda Amber,  DO    Nerve Conduction Studies Anti Sensory Summary Table   Site NR Peak (ms) Norm Peak (ms) P-T Amp (V) Norm P-T Amp  Left Sup Peroneal Anti Sensory (Ant Lat Mall)  33.8C  12 cm NR  <4.6  >3  Right Sup Peroneal Anti Sensory (Ant Lat Mall)  33.8C  12 cm NR  <4.6  >3  Left Sural Anti Sensory (Lat Mall)  33.8C  Calf NR  <4.6  >3  Right Sural Anti Sensory (Lat Mall)  33.8C  Calf NR  <4.6  >3   Motor Summary Table   Site NR Onset (ms) Norm Onset (ms) O-P Amp (mV) Norm O-P Amp Site1 Site2 Delta-0 (ms) Dist (cm) Vel (m/s) Norm Vel (m/s)  Left Peroneal Motor (Ext Dig Brev)  33.8C  Ankle    5.7 <6.0 0.6 >2.5 B Fib Ankle 15.3 41.0 27 >40  B Fib    21.0  0.6  Poplt B Fib 1.7 9.0 53 >40  Poplt    22.7  0.6         Right Peroneal Motor (Ext Dig Brev)  33.8C  Ankle    3.2 <6.0 0.8 >2.5 B Fib Ankle 15.7 42.0 27 >40  B Fib    18.9  0.5  Poplt B Fib 2.0 8.0 40 >40  Poplt    20.9  0.5         Left Peroneal TA Motor (Tib Ant)  33.8C  Fib Head    3.3 <4.5 1.7 >3 Poplit Fib Head 2.1 9.0 43 >40  Poplit    5.4  1.4         Right Peroneal TA Motor (Tib Ant)  33.8C  Fib Head    3.3 <4.5 3.8 >3  Poplit Fib Head 1.6 9.0 56 >40  Poplit    4.9  3.4         Left Tibial Motor (Abd Hall Brev)  33.8C  Ankle NR  <6.0  >4 Knee Ankle  41.0  >40  Knee NR            Right Tibial Motor (Abd Hall Brev)  33.8C  Ankle NR  <6.0  >4 Knee Ankle  0.0  >40  Knee NR             H Reflex Studies   NR H-Lat (ms) Lat Norm (ms) L-R H-Lat (ms)  Left Tibial (Gastroc)  33.8C  NR  <35   Right Tibial (Gastroc)  33.8C     37.96 <35    EMG   Side Muscle Ins Act Fibs Psw Fasc Number Recrt Dur Dur. Amp Amp. Poly Poly. Comment  Left AntTibialis Nml Nml Nml 1+ 3- Rapid All 1+ All 1+ Nml Nml N/A  Left Gastroc Nml 1+ Nml 1+ 3- Rapid All 1+ All 1+ Nml Nml N/A  Left GluteusMed Nml Nml Nml Nml Nml Nml Nml Nml Nml Nml Nml Nml N/A  Left BicepsFemS Nml Nml Nml Nml Nml Nml Nml Nml Nml Nml Nml Nml N/A  Right AntTibialis  Nml 1+ Nml 1+ 2- Rapid Many 1+ Many 1+ Nml Nml N/A  Right Gastroc Nml 1+ Nml 1+ 2- Rapid All 1+ All 1+ Nml Nml N/A  Right RectFemoris Nml Nml Nml Nml 2- Rapid Many 1+ Many 1+ Nml Nml N/A  Right GluteusMed Nml Nml Nml Nml 1- Rapid Some 1+ Some 1+ Nml Nml N/A  Right BicepsFemS Nml Nml Nml Nml Nml Nml Nml Nml Nml Nml Nml Nml N/A      Waveforms:

## 2016-12-30 ENCOUNTER — Telehealth: Payer: Self-pay | Admitting: Podiatry

## 2016-12-30 NOTE — Telephone Encounter (Signed)
I informed pt of Dr. Stephenie Acres review of results and that the results and a letter had been sent to Dr. Ronnald Ramp in [place].

## 2016-12-30 NOTE — Telephone Encounter (Signed)
I had a nerve conductivity test done on my legs several weeks ago that Dr. Milinda Pointer had ordered. That test has been completed and I have never heard the results. I was wondering if someone call me and let me know the results and what the next steps are if any. You can reach me at [phone].

## 2016-12-30 NOTE — Telephone Encounter (Signed)
I sent the results to his neurosurgeon Dr. Sherley Bounds. You can follow up and make sure the results got to him. I then wrote him a note telling him that it was from his back most likely from previous surgeries. Please inform the patient of this and let Dr. Adah Salvage office know that this patient needs to be rescheduled

## 2017-01-03 ENCOUNTER — Telehealth: Payer: Self-pay | Admitting: *Deleted

## 2017-01-03 NOTE — Telephone Encounter (Signed)
Pt states he would like his test results sent to his neurologist. I spoke with pt, he requested NCV with EMG be faxed to Dr. Manon Hilding in [place] fax [phone]. Faxed NCV with EMG to Dr. Manon Hilding.

## 2017-07-23 ENCOUNTER — Other Ambulatory Visit: Payer: Self-pay | Admitting: Neurological Surgery

## 2017-07-23 DIAGNOSIS — M48062 Spinal stenosis, lumbar region with neurogenic claudication: Secondary | ICD-10-CM

## 2017-08-05 ENCOUNTER — Other Ambulatory Visit: Payer: 59

## 2017-08-06 ENCOUNTER — Other Ambulatory Visit: Payer: 59

## 2017-08-29 ENCOUNTER — Ambulatory Visit
Admission: RE | Admit: 2017-08-29 | Discharge: 2017-08-29 | Disposition: A | Discharge status: Home / Self Care | Payer: 59 | Source: Ambulatory Visit | Attending: Neurological Surgery | Admitting: Neurological Surgery

## 2017-08-29 DIAGNOSIS — M48062 Spinal stenosis, lumbar region with neurogenic claudication: Secondary | ICD-10-CM

## 2017-08-29 MED ORDER — ONDANSETRON HCL 4 MG/2ML IJ SOLN
4.0000 mg | Freq: Once | INTRAMUSCULAR | Status: AC
  Administered 2017-08-29: 4 mg via INTRAMUSCULAR

## 2017-08-29 MED ORDER — DIAZEPAM 5 MG PO TABS
10.0000 mg | ORAL_TABLET | Freq: Once | ORAL | Status: AC
  Administered 2017-08-29: 10 mg via ORAL

## 2017-08-29 MED ORDER — MEPERIDINE HCL 50 MG/ML IJ SOLN
50.0000 mg | Freq: Once | INTRAMUSCULAR | Status: AC
  Administered 2017-08-29: 50 mg via INTRAMUSCULAR

## 2017-08-29 MED ORDER — IOPAMIDOL (ISOVUE-M 200) INJECTION 41%
15.0000 mL | Freq: Once | INTRAMUSCULAR | Status: AC
  Administered 2017-08-29: 15 mL via INTRATHECAL

## 2017-08-29 NOTE — Discharge Instructions (Signed)
Myelogram Discharge Instructions  1. Go home and rest quietly for the next 24 hours.  It is important to lie flat for the next 24 hours.  Get up only to go to the restroom.  You may lie in the bed or on a couch on your back, your stomach, your left side or your right side.  You may have one pillow under your head.  You may have pillows between your knees while you are on your side or under your knees while you are on your back.  2. DO NOT drive today.  Recline the seat as far back as it will go, while still wearing your seat belt, on the way home.  3. You may get up to go to the bathroom as needed.  You may sit up for 10 minutes to eat.  You may resume your normal diet and medications unless otherwise indicated.  Drink lots of extra fluids today and tomorrow.  4. The incidence of headache, nausea, or vomiting is about 5% (one in 20 patients).  If you develop a headache, lie flat and drink plenty of fluids until the headache goes away.  Caffeinated beverages may be helpful.  If you develop severe nausea and vomiting or a headache that does not go away with flat bed rest, call [phone].  5. You may resume normal activities after your 24 hours of bed rest is over; however, do not exert yourself strongly or do any heavy lifting tomorrow. If when you get up you have a headache when standing, go back to bed and force fluids for another 24 hours.  6. Call your physician for a follow-up appointment.  The results of your myelogram will be sent directly to your physician by the following day.  7. If you have any questions or if complications develop after you arrive home, please call [phone].  Discharge instructions have been explained to the patient.  The patient, or the person responsible for the patient, fully understands these instructions.   YOU MAY RESTART YOUR XARELTO TODAY. YOU MAY RESTART YOUR CELEBREX TOMORROW 08/30/2017 AT 09:30AM.

## 2018-03-09 ENCOUNTER — Other Ambulatory Visit (HOSPITAL_COMMUNITY): Payer: Self-pay | Admitting: Neurological Surgery

## 2018-03-09 DIAGNOSIS — M961 Postlaminectomy syndrome, not elsewhere classified: Secondary | ICD-10-CM

## 2018-03-10 ENCOUNTER — Ambulatory Visit (HOSPITAL_COMMUNITY)
Admission: RE | Admit: 2018-03-10 | Discharge: 2018-03-10 | Disposition: A | Discharge status: Home / Self Care | Payer: 59 | Source: Ambulatory Visit | Attending: Neurological Surgery | Admitting: Neurological Surgery

## 2018-03-10 DIAGNOSIS — M961 Postlaminectomy syndrome, not elsewhere classified: Secondary | ICD-10-CM | POA: Diagnosis present

## 2018-09-07 ENCOUNTER — Other Ambulatory Visit: Payer: Self-pay

## 2018-09-07 ENCOUNTER — Encounter: Payer: Self-pay | Admitting: Physical Medicine & Rehabilitation

## 2018-09-07 ENCOUNTER — Encounter: Payer: 59 | Attending: Physical Medicine & Rehabilitation | Admitting: Physical Medicine & Rehabilitation

## 2018-09-07 VITALS — BP 129/84 | HR 67 | Temp 99.2°F | Ht 73.5 in | Wt 275.0 lb

## 2018-09-07 DIAGNOSIS — M961 Postlaminectomy syndrome, not elsewhere classified: Secondary | ICD-10-CM | POA: Insufficient documentation

## 2018-09-07 DIAGNOSIS — M533 Sacrococcygeal disorders, not elsewhere classified: Secondary | ICD-10-CM | POA: Diagnosis present

## 2018-09-07 NOTE — Progress Notes (Signed)
UDS cancelled by order of provider.

## 2018-09-07 NOTE — Progress Notes (Signed)
Subjective:    Patient ID: Frederick Phillips, male    DOB: March 10, 1956, 63 y.o.   MRN: [id number] Referred HPI  63 year old male with primary complaint of right-sided low back pain.  He states that the primary pain is below the belt.  It occasionally radiates into his thigh but really not below his knee.  The patient has a history of polyneuropathy causing bilateral foot numbness.  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  He has used an AFO in the past but currently is not using any type of orthotic. The patient also has some pain in the mid back above his scar.  This is also right-sided.  This pain occurs with prolonged walking or standing. Pain started after L2 L3-L4-L5 lumbar fusion 2017 performed by Dr. Ronnald Ramp.   Pt had good response to steroid injections in the sacroiliac joint ~68month ago.   Patient has a new job which involves walking much of the day.  He had not worked for about 5 months prior to that time and this represents a big increase in his overall activity level.  S/p SCS placement performed in January 2020 SCS helped with hypersensitivity of feet.  It did not help with his low back pain.    Pain Inventory Average Pain 4 Pain Right Now 3 My pain is intermittent and aching  In the last 24 hours, has pain interfered with the following? General activity 3 Relation with others 2 Enjoyment of life 3 What TIME of day is your pain at its worst? daytime Sleep (in general) Fair  Pain is worse with: walking, bending and standing Pain improves with: rest and injections Relief from Meds: 0  Mobility walk without assistance ability to climb steps?  no do you drive?  yes  Function employed # of hrs/week 30  Neuro/Psych numbness dizziness  Prior Studies Any changes since last visit?  no  Physicians involved in your care Any changes since last visit?  no   No family history  on file. Social History   Socioeconomic History  . Marital status: Married    Spouse name: Not on file  . Number of children: Not on file  . Years of education: Not on file  . Highest education level: Not on file  Occupational History  . Not on file  Social Needs  . Financial resource strain: Not on file  . Food insecurity    Worry: Not on file    Inability: Not on file  . Transportation needs    Medical: Not on file    Non-medical: Not on file  Tobacco Use  . Smoking status: Never Smoker  . Smokeless tobacco: Never Used  Substance and Sexual Activity  . Alcohol use: Yes    Comment: socially 1-2 drinks per day  . Drug use: No  . Sexual activity: Not on file  Lifestyle  . Physical activity    Days per week: Not on file    Minutes per session: Not on file  . Stress: Not on file  Relationships  . Social Herbalist on phone: Not on file    Gets together: Not on file    Attends religious service: Not on file    Active member of club or organization: Not on file    Attends meetings of clubs or organizations: Not on file    Relationship status: Not on file  Other  Topics Concern  . Not on file  Social History Narrative  . Not on file   Past Surgical History:  Procedure Laterality Date  . BACK SURGERY     laminectomy L4-L5  . CHOLECYSTECTOMY    . CORONARY ARTERY BYPASS GRAFT     double bypass at [facility]  . HAND SURGERY     surgical blade removed from left hand  . KNEE ARTHROSCOPY Right   . LEG SURGERY     left leg broken, then compartment syndrome from this surgery, surgery x5  . LUMBAR LAMINECTOMY/DECOMPRESSION MICRODISCECTOMY Bilateral 09/12/2016   Procedure: Bilateral Lumbar five -Sacral one Hemilaminectomy;  Surgeon: Eustace Moore, MD;  Location: [facility];  Service: Neurosurgery;  Laterality: Bilateral;  . SHOULDER ARTHROSCOPY Bilateral   . STRABISMUS SURGERY    . TESTICLE SURGERY     63 yo   Past Medical History:  Diagnosis Date  .  Arthritis   . Cancer ([place])    skin cancer  . Coronary artery disease   . DVT (deep venous thrombosis) (HCC)    right leg  . History of kidney stones   . Hypertension   . Sleep apnea    uses CPAP   BP 129/84   Pulse 67   Temp 99.2 F (37.3 C)   Ht 6' 1.5" (1.867 m)   Wt 275 lb (124.7 kg)   SpO2 96%   BMI 35.79 kg/m   Opioid Risk Score:   Fall Risk Score:  `1  Depression screen PHQ 2/9  No flowsheet data found.   Review of Systems  Constitutional: Negative.   HENT: Negative.   Eyes: Negative.   Respiratory: Negative.   Cardiovascular: Negative.   Gastrointestinal: Negative.   Endocrine: Negative.   Genitourinary: Negative.   Musculoskeletal: Positive for arthralgias, back pain and myalgias.  Skin: Negative.   Allergic/Immunologic: Negative.   Neurological: Positive for dizziness and numbness.  Hematological: Negative.   Psychiatric/Behavioral: Negative.   All other systems reviewed and are negative.      Objective:   Physical Exam Vitals signs and nursing note reviewed.  Constitutional:      Appearance: He is obese.  HENT:     Head: Normocephalic and atraumatic.     Mouth/Throat:     Mouth: Mucous membranes are moist.  Eyes:     Extraocular Movements: Extraocular movements intact.     Conjunctiva/sclera: Conjunctivae normal.     Pupils: Pupils are equal, round, and reactive to light.  Neck:     Musculoskeletal: Normal range of motion.  Cardiovascular:     Rate and Rhythm: Normal rate and regular rhythm.     Pulses: Normal pulses.     Heart sounds: No murmur.  Pulmonary:     Effort: Pulmonary effort is normal.     Breath sounds: Normal breath sounds. No stridor.  Abdominal:     General: Bowel sounds are normal. There is no distension.     Palpations: Abdomen is soft.  Musculoskeletal:     Right hip: He exhibits normal range of motion and no tenderness.     Left hip: He exhibits normal range of motion and no tenderness.     Thoracic back: He  exhibits decreased range of motion and tenderness. He exhibits no deformity.     Lumbar back: He exhibits decreased range of motion and tenderness. He exhibits no deformity and no spasm.     Comments: Tenderness right L4 area as well as right L1 area  in the paraspinal region  Skin:    General: Skin is warm and dry.     Coloration: Skin is not jaundiced.  Neurological:     Mental Status: He is alert and oriented to person, place, and time.     Motor: No weakness.     Gait: Gait abnormal.     Comments: Steppage gait with left foot drop  Sensation absent to light touch bilateral L5 area also absent at bilateral L4 there is relative preservation at bilateral S1. Motor strength is 5/5 bilateral deltoid bicep tricep grip hip flexor and knee extensor right ankle dorsiflexor 4/5 left ankle dorsiflexor 3-/5 right toe flexors and extensors 4/5 left toe flexors and extensors 3-/5   Psychiatric:        Mood and Affect: Mood normal.        Behavior: Behavior normal.        Thought Content: Thought content normal.        Judgment: Judgment normal.           Assessment & Plan:  1.  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  He does have right sided buttocks pain which has been relieved with sacroiliac intra-articular injection on 2 occasions.  He is proximally 1 month post.  He feels like the last injection is starting to wear off but overall still doing quite well from an activity standpoint. We discussed that the sacroiliac joint is a common pain generator after lumbar fusion.  His response to diagnostic injection has been positive.  We discussed that if he does not get a good longer-term relief with the intra-articular injection, radiofrequency may be an option.  He states that he received some type of radiofrequency injection but he could not tell me which area.  I will discussed this with Dr. Brien Few to get some further information.  We discussed that given his good  response to the sacroiliac intra-articular injection, I do not think a botulinum toxin injection into the hip or low back musculature would be of much benefit.  We also discussed the mid back pain that is above the fusion.  This may represent L1-L2 facet joint mediated pain on the right side.  If this becomes more problematic he may discuss this with Dr. Brien Few.

## 2018-09-07 NOTE — Patient Instructions (Signed)
I will touch base with Dr Brien Few to se what type of ablation he performed.  If it looks like you will need a radiofreq ablation of the sacroiliac joint , we can do that here.  Right sided mid back pain may be related to the L1-2, L2-3 joints.  Medial branch blocks may be helpful to evaluate  Consider brace for foot frop as an uneven gait can cause back pain.    I don't think Botox would be of benefit in this case

## 2018-09-11 ENCOUNTER — Telehealth: Payer: Self-pay | Admitting: *Deleted

## 2018-09-11 NOTE — Telephone Encounter (Signed)
Left VM informing him we do not have update at this time.

## 2018-09-11 NOTE — Telephone Encounter (Signed)
Mr Humm called and is asking for an update on his plan of care--has Dr Letta Pate spoken with Dr Brien Few yet? Please advise.

## 2018-09-11 NOTE — Telephone Encounter (Signed)
Not yet, I sent email but no response

## 2018-10-06 ENCOUNTER — Telehealth: Payer: Self-pay | Admitting: *Deleted

## 2018-10-06 NOTE — Telephone Encounter (Signed)
Frederick Phillips called and is asking about his injection coming up on 10/23/18. Is it an SI injection or trigger point.  I have left him a message that it is actually for botox injection. I am not clear about how the order for botox originated. Please advise.

## 2018-10-06 NOTE — Telephone Encounter (Signed)
I asked Lattie Haw to cancel that appointment it looks like Dr. Brien Few wants her to get Botox although I do not think the patient would benefit.  I have tried to contact him but have not had success yet.

## 2018-10-13 ENCOUNTER — Telehealth: Payer: Self-pay | Admitting: Physical Medicine & Rehabilitation

## 2018-10-13 NOTE — Telephone Encounter (Signed)
Patient is calling to schedule appointment for "muscular triger point botox injection". Please advise.

## 2018-10-15 NOTE — Telephone Encounter (Signed)
I spoke to Dr Brien Few and indicated that insurance will not pay for Botox trigger point injection We can let Frederick Phillips know that he will need to pay out of pocket for 100U plus injection fee.  If we use a sample BOtox there would be less cost

## 2018-10-23 ENCOUNTER — Ambulatory Visit: Payer: 59 | Admitting: Physical Medicine & Rehabilitation

## 2018-11-03 ENCOUNTER — Other Ambulatory Visit: Payer: Self-pay | Admitting: Neurological Surgery

## 2018-11-03 ENCOUNTER — Telehealth: Payer: Self-pay | Admitting: Nurse Practitioner

## 2018-11-03 DIAGNOSIS — G8929 Other chronic pain: Secondary | ICD-10-CM

## 2018-11-03 DIAGNOSIS — M545 Low back pain, unspecified: Secondary | ICD-10-CM

## 2018-11-03 NOTE — Telephone Encounter (Signed)
Phone call to patient to verify medication list and allergies for myelogram procedure. Pt instructed to hold Xarelto for 24hrs prior to myelogram appointment time pending approval from cardiologist Dr. Lamar Blinks. Pt verbalized understanding. Pre and post procedure instructions reviewed with pt. Faxed thinner hold request, pending reply and further request.

## 2018-11-12 ENCOUNTER — Other Ambulatory Visit: Payer: Self-pay

## 2018-11-12 ENCOUNTER — Ambulatory Visit
Admission: RE | Admit: 2018-11-12 | Discharge: 2018-11-12 | Disposition: A | Discharge status: Home / Self Care | Payer: 59 | Source: Ambulatory Visit | Attending: Neurological Surgery | Admitting: Neurological Surgery

## 2018-11-12 DIAGNOSIS — G8929 Other chronic pain: Secondary | ICD-10-CM

## 2018-11-12 DIAGNOSIS — M545 Low back pain, unspecified: Secondary | ICD-10-CM

## 2018-11-12 MED ORDER — IOPAMIDOL (ISOVUE-M 200) INJECTION 41%
20.0000 mL | Freq: Once | INTRAMUSCULAR | Status: AC
  Administered 2018-11-12: 10:00:00 20 mL via INTRATHECAL

## 2018-11-12 MED ORDER — DIAZEPAM 5 MG PO TABS
10.0000 mg | ORAL_TABLET | Freq: Once | ORAL | Status: AC
  Administered 2018-11-12: 10 mg via ORAL

## 2018-11-20 ENCOUNTER — Ambulatory Visit: Payer: 59 | Admitting: Physical Medicine & Rehabilitation

## 2020-10-30 ENCOUNTER — Ambulatory Visit: Admit: 2020-10-30 | Discharge: 2020-10-30 | Payer: MEDICARE | Attending: Family Medicine | Primary: Family Medicine

## 2020-10-30 DIAGNOSIS — D508 Other iron deficiency anemias: Secondary | ICD-10-CM

## 2020-10-30 NOTE — Progress Notes (Signed)
Chad Rosario  18-Jun-1955 is a 65 y.o. male ,Established patient, here for evaluation of the following chief complaint(s):   Chief Complaint   Patient presents with    Establish Care     Would like to have his feet looked.    Cholesterol Problem     Pt is not fasting          1. Iron deficiency anemia secondary to inadequate dietary iron intake  -     CBC with Auto Differential; Future  -     Iron; Future  -     Ferritin; Future  2. Thrombocytopenia (HCC)  -     CBC with Auto Differential; Future  3. Chronic gout without tophus, unspecified cause, unspecified site  -     Uric Acid; Future  4. Mixed hyperlipidemia  -     Lipid Panel; Future  5. Primary hypertension  -     CBC with Auto Differential; Future  -     Comprehensive Metabolic Panel; Future  6. Idiopathic peripheral neuropathy  -     [facility] - Milly Jakob, MD, Neurology, [place]  7. Proteinuria, unspecified type  -     Urine Microscopic; Future  8. S/P 2-vessel coronary artery bypass  -     [facility] - Luiz Blare, MD, Cardiology, [place]  9. Polyp of colon, unspecified part of colon, unspecified type  -     [facility] - Drucie Opitz, MD, Gastroenterology, [place]        Return in about 4 weeks (around 11/27/2020).        Subjective   SUBJECTIVE/OBJECTIVE:  He presents in as new patient. He has just moved here in June from [place]. He is concerned and would like a referral to neurology regarding his PERIPHERAL NEUROPATHY which has been present for a while and longstanding. He is on CPAP for OSA --chronic stable and undercontrol.    He has had previous work up in [place] for ANEMIA AND THROMBOCYTOPENIA.    He denies depressive symptoms-----he does not smoke and is occasional social drinker.    His surgical history is prominent for CABG TIMES 2 2015--he has not followed with cardiology lately.--over a year at least.    He has BACK SURGERY IN 2017 but it has been determined that the neuropathy is not back related but idiopathic.    He needs  DISABILITY PLACARD due to the pain and numbness associated with neuropathy.    Hypertension  This is a chronic problem. The current episode started more than 1 year ago. The problem has been rapidly improving since onset. The problem is controlled. Pertinent negatives include no anxiety, blurred vision, chest pain, headaches, palpitations, peripheral edema, PND, shortness of breath or sweats.   Hyperlipidemia  This is a chronic problem. The current episode started more than 1 year ago. Recent lipid tests were reviewed and are variable. Exacerbating diseases include obesity. He has no history of diabetes or hypothyroidism. Pertinent negatives include no chest pain, myalgias or shortness of breath.     Review of Systems   Constitutional:  Negative for chills and fever.   HENT:  Negative for ear pain, hearing loss and sore throat.    Eyes:  Negative for blurred vision, photophobia and pain.   Respiratory:  Negative for cough and shortness of breath.    Cardiovascular:  Negative for chest pain, palpitations, leg swelling and PND.   Gastrointestinal:  Negative for abdominal distention, abdominal pain, blood in stool and nausea.  Genitourinary:  Negative for dysuria and urgency.   Musculoskeletal:  Negative for joint swelling and myalgias.   Skin:  Negative for color change, pallor and rash.   Neurological:  Negative for speech difficulty, weakness, light-headedness and headaches.   Hematological:  Negative for adenopathy.   Psychiatric/Behavioral:  Negative for agitation, confusion, hallucinations, self-injury and suicidal ideas.         Objective   Physical Exam              An electronic signature was used to authenticate this note.    --[name], MD

## 2020-11-02 ENCOUNTER — Encounter: Admit: 2020-11-02 | Discharge: 2020-11-02 | Payer: MEDICARE | Primary: Family Medicine

## 2020-11-02 DIAGNOSIS — R809 Proteinuria, unspecified: Secondary | ICD-10-CM

## 2020-11-02 LAB — CBC WITH AUTO DIFFERENTIAL
Absolute Eos #: 0.8 10*3/uL (ref 0.0–0.8)
Absolute Immature Granulocyte: 0 10*3/uL (ref 0.0–0.5)
Absolute Lymph #: 1.5 10*3/uL (ref 0.5–4.6)
Absolute Mono #: 0.7 10*3/uL (ref 0.1–1.3)
Basophils Absolute: 0.1 10*3/uL (ref 0.0–0.2)
Basophils: 1 % (ref 0.0–2.0)
Eosinophils %: 12 % — ABNORMAL HIGH (ref 0.5–7.8)
Hematocrit: 46.2 % (ref 41.1–50.3)
Hemoglobin: 15.4 g/dL (ref 13.6–17.2)
Immature Granulocytes: 1 % (ref 0.0–5.0)
Lymphocytes: 23 % (ref 13–44)
MCH: 27.4 PG (ref 26.1–32.9)
MCHC: 33.3 g/dL (ref 31.4–35.0)
MCV: 82.2 FL (ref 79.6–97.8)
MPV: 12 FL (ref 9.4–12.3)
Monocytes: 11 % (ref 4.0–12.0)
Platelets: 114 10*3/uL — ABNORMAL LOW (ref 150–450)
RBC: 5.62 M/uL — ABNORMAL HIGH (ref 4.23–5.6)
RDW: 18 % — ABNORMAL HIGH (ref 11.9–14.6)
Seg Neutrophils: 53 % (ref 43–78)
Segs Absolute: 3.5 10*3/uL (ref 1.7–8.2)
WBC: 6.6 10*3/uL (ref 4.3–11.1)
nRBC: 0 10*3/uL (ref 0.0–0.2)

## 2020-11-02 LAB — COMPREHENSIVE METABOLIC PANEL
ALT: 43 U/L (ref 12–65)
AST: 32 U/L (ref 15–37)
Albumin/Globulin Ratio: 1.1 — ABNORMAL LOW (ref 1.2–3.5)
Albumin: 3.9 g/dL (ref 3.2–4.6)
Alk Phosphatase: 69 U/L (ref 50–136)
Anion Gap: 3 mmol/L — ABNORMAL LOW (ref 7–16)
BUN: 17 MG/DL (ref 8–23)
CO2: 26 mmol/L (ref 21–32)
Calcium: 9.1 MG/DL (ref 8.3–10.4)
Chloride: 107 mmol/L (ref 98–107)
Creatinine: 1.1 MG/DL (ref 0.8–1.5)
GFR African American: >60 mL/min/{1.73_m2} (ref >60)
GFR Non-African American: >60 mL/min/{1.73_m2} (ref >60)
Globulin: 3.7 g/dL — ABNORMAL HIGH (ref 2.3–3.5)
Glucose: 106 mg/dL — ABNORMAL HIGH (ref 65–100)
Potassium: 4.5 mmol/L (ref 3.5–5.1)
Sodium: 136 mmol/L — ABNORMAL LOW (ref 138–145)
Total Bilirubin: 0.8 MG/DL (ref 0.2–1.1)
Total Protein: 7.6 g/dL (ref 6.3–8.2)

## 2020-11-02 LAB — URINALYSIS, MICRO: BACTERIA, URINE: NEGATIVE /hpf — AB

## 2020-11-02 LAB — LIPID PANEL
Chol/HDL Ratio: 3.8
Cholesterol, Total: 157 MG/DL (ref <200)
HDL: 41 MG/DL (ref 40–60)
LDL Calculated: 95.6 MG/DL (ref <100)
Triglycerides: 102 MG/DL (ref 35–150)
VLDL Cholesterol Calculated: 20.4 MG/DL (ref 6.0–23.0)

## 2020-11-02 LAB — IRON: Iron: 43 ug/dL (ref 35–150)

## 2020-11-02 LAB — URIC ACID: Uric Acid: 6.1 MG/DL — ABNORMAL HIGH (ref 2.6–6.0)

## 2020-11-02 LAB — FERRITIN: Ferritin: 14 NG/ML (ref 8–388)

## 2020-11-04 NOTE — Telephone Encounter (Signed)
Please check on status of neurology referral--per patient request.

## 2020-11-07 ENCOUNTER — Ambulatory Visit: Admit: 2020-11-07 | Discharge: 2020-11-07 | Payer: MEDICARE | Attending: Gastroenterology | Primary: Family Medicine

## 2020-11-07 DIAGNOSIS — Z1211 Encounter for screening for malignant neoplasm of colon: Secondary | ICD-10-CM

## 2020-11-07 MED ORDER — GOLYTELY 236 G PO SOLR
236 g | Freq: Once | ORAL | 0 refills | Status: DC
Start: 2020-11-07 — End: 2020-11-07

## 2020-11-07 MED ORDER — GOLYTELY 236 G PO SOLR
236 g | Freq: Once | ORAL | 0 refills | Status: AC
Start: 2020-11-07 — End: 2020-11-07

## 2020-11-07 NOTE — Telephone Encounter (Signed)
Left VM and informed pt that the Golytely was called to the pharmacy of choice and he could pick it up when he's ready.  TL

## 2020-11-07 NOTE — Progress Notes (Signed)
Chad Rosario (DOB:  05-28-1955) is a 65 y.o. male, new patient here for evaluation of the following chief complaint(s):  Colonoscopy         ASSESSMENT/PLAN:  1. Encounter for screening colonoscopy           Subjective   SUBJECTIVE/OBJECTIVE  Presenting to arrange a colonoscopic evaluation.  He denied any rectal bleeding and change in bowel habits.  He had a history of adenomatous colon polyp last colonoscopy 2016.  History of sleep apnea on CPAP.    Past Medical History:   Diagnosis Date    Hypertension     Sleep apnea        Past Surgical History:   Procedure Laterality Date    BACK SURGERY  2021    has had 3 different ones last was in 2021    CARDIAC SURGERY      CHOLECYSTECTOMY      EYE SURGERY      FRACTURE SURGERY      TOTAL HIP ARTHROPLASTY               Allergies   Allergen Reactions    Hydrocodone-Acetaminophen Anxiety and Itching    Losartan Shortness Of Breath    Amlodipine Other (See Comments)     Other reaction(s): Other (See Comments)  Fatigue   Fatigue       Rosuvastatin Other (See Comments)     Other reaction(s): Constipation  Fatigue, constipation  Other reaction(s): Fatigue    Atorvastatin Diarrhea    Ezetimibe      Other reaction(s): Constipation, Other (See Comments)  Constipation and decreased urine output  Constipation and decreased urine output  Other reaction(s): Constipation, Urine output low            Review of Systems   Constitutional:  Negative for appetite change.   HENT:  Negative for mouth sores and trouble swallowing.    Respiratory:  Negative for shortness of breath.    Cardiovascular:  Negative for chest pain.   Gastrointestinal:  Negative for abdominal pain, blood in stool and vomiting.   Skin:  Negative for color change.   Allergic/Immunologic: Negative for food allergies.   Neurological:  Negative for seizures and weakness.   Hematological:  Does not bruise/bleed easily.            Objective   Physical Exam  HENT:      Head: Normocephalic.      Mouth/Throat:      Mouth: Mucous  membranes are moist.   Eyes:      General: No scleral icterus.  Cardiovascular:      Rate and Rhythm: Normal rate and regular rhythm.   Pulmonary:      Effort: No respiratory distress.   Abdominal:      General: There is no distension.      Tenderness: There is no abdominal tenderness. There is no rebound.   Lymphadenopathy:      Cervical: No cervical adenopathy.   Skin:     Coloration: Skin is not jaundiced.      Findings: No bruising.   Neurological:      General: No focal deficit present.      Mental Status: He is alert.            Current Outpatient Medications   Medication Sig Dispense Refill    Acetaminophen 500 MG CAPS Take 1,000 mg by mouth as needed      allopurinol (ZYLOPRIM) 300 MG tablet TAKE 1 TABLET  BY MOUTH  DAILY      Alpha-Lipoic Acid 200 MG CAPS Take 300 mg by mouth      amitriptyline (ELAVIL) 10 MG tablet Take 10 mg nightly      aspirin 81 MG EC tablet Take 81 mg by mouth in the morning.      azelastine (ASTELIN) 0.1 % nasal spray 1 spray by Nasal route as needed      colchicine (MITIGARE) 0.6 MG capsule Take 0.6 mg by mouth in the morning.      fexofenadine (ALLEGRA) 180 MG tablet Take 180 mg by mouth in the morning.      gabapentin (NEURONTIN) 300 MG capsule Take 300 capsules by mouth in the morning, at noon, and at bedtime. Take 2 capsules 3 times a day      lisinopril (PRINIVIL;ZESTRIL) 20 MG tablet Take 20 mg by mouth in the morning.      rivaroxaban (XARELTO) 10 MG TABS tablet Take 10 mg by mouth in the morning.      zolpidem (AMBIEN) 10 MG tablet TAKE 1 TABLET BY MOUTH  DAILY AS NEEDED       No current facility-administered medications for this visit.       History reviewed. No pertinent family history.    Return for colonoscopy.            An electronic signature was used to authenticate this note.    --Drucie Opitz, MD

## 2020-11-07 NOTE — H&P (View-Only) (Signed)
Chad Rosario (DOB:  06/06/1955) is a 65 y.o. male, new patient here for evaluation of the following chief complaint(s):  Colonoscopy         ASSESSMENT/PLAN:  1. Encounter for screening colonoscopy           Subjective   SUBJECTIVE/OBJECTIVE  Presenting to arrange a colonoscopic evaluation.  He denied any rectal bleeding and change in bowel habits.  He had a history of adenomatous colon polyp last colonoscopy 2016.  History of sleep apnea on CPAP.    Past Medical History:   Diagnosis Date    Hypertension     Sleep apnea        Past Surgical History:   Procedure Laterality Date    BACK SURGERY  2021    has had 3 different ones last was in 2021    CARDIAC SURGERY      CHOLECYSTECTOMY      EYE SURGERY      FRACTURE SURGERY      TOTAL HIP ARTHROPLASTY               Allergies   Allergen Reactions    Hydrocodone-Acetaminophen Anxiety and Itching    Losartan Shortness Of Breath    Amlodipine Other (See Comments)     Other reaction(s): Other (See Comments)  Fatigue   Fatigue       Rosuvastatin Other (See Comments)     Other reaction(s): Constipation  Fatigue, constipation  Other reaction(s): Fatigue    Atorvastatin Diarrhea    Ezetimibe      Other reaction(s): Constipation, Other (See Comments)  Constipation and decreased urine output  Constipation and decreased urine output  Other reaction(s): Constipation, Urine output low            Review of Systems   Constitutional:  Negative for appetite change.   HENT:  Negative for mouth sores and trouble swallowing.    Respiratory:  Negative for shortness of breath.    Cardiovascular:  Negative for chest pain.   Gastrointestinal:  Negative for abdominal pain, blood in stool and vomiting.   Skin:  Negative for color change.   Allergic/Immunologic: Negative for food allergies.   Neurological:  Negative for seizures and weakness.   Hematological:  Does not bruise/bleed easily.            Objective   Physical Exam  HENT:      Head: Normocephalic.      Mouth/Throat:      Mouth: Mucous  membranes are moist.   Eyes:      General: No scleral icterus.  Cardiovascular:      Rate and Rhythm: Normal rate and regular rhythm.   Pulmonary:      Effort: No respiratory distress.   Abdominal:      General: There is no distension.      Tenderness: There is no abdominal tenderness. There is no rebound.   Lymphadenopathy:      Cervical: No cervical adenopathy.   Skin:     Coloration: Skin is not jaundiced.      Findings: No bruising.   Neurological:      General: No focal deficit present.      Mental Status: He is alert.            Current Outpatient Medications   Medication Sig Dispense Refill    Acetaminophen 500 MG CAPS Take 1,000 mg by mouth as needed      allopurinol (ZYLOPRIM) 300 MG tablet TAKE 1 TABLET   BY MOUTH  DAILY      Alpha-Lipoic Acid 200 MG CAPS Take 300 mg by mouth      amitriptyline (ELAVIL) 10 MG tablet Take 10 mg nightly      aspirin 81 MG EC tablet Take 81 mg by mouth in the morning.      azelastine (ASTELIN) 0.1 % nasal spray 1 spray by Nasal route as needed      colchicine (MITIGARE) 0.6 MG capsule Take 0.6 mg by mouth in the morning.      fexofenadine (ALLEGRA) 180 MG tablet Take 180 mg by mouth in the morning.      gabapentin (NEURONTIN) 300 MG capsule Take 300 capsules by mouth in the morning, at noon, and at bedtime. Take 2 capsules 3 times a day      lisinopril (PRINIVIL;ZESTRIL) 20 MG tablet Take 20 mg by mouth in the morning.      rivaroxaban (XARELTO) 10 MG TABS tablet Take 10 mg by mouth in the morning.      zolpidem (AMBIEN) 10 MG tablet TAKE 1 TABLET BY MOUTH  DAILY AS NEEDED       No current facility-administered medications for this visit.       History reviewed. No pertinent family history.    Return for colonoscopy.            An electronic signature was used to authenticate this note.    --Drucie Opitz, MD

## 2020-11-07 NOTE — Addendum Note (Signed)
Addended by: Drucie Opitz on: 11/07/2020 09:41 AM     Modules accepted: Orders

## 2020-11-07 NOTE — Addendum Note (Signed)
Addended by: Lana Fish on: 11/07/2020 09:41 AM     Modules accepted: Orders

## 2020-11-23 ENCOUNTER — Institutional Professional Consult (permissible substitution)
Admit: 2020-11-23 | Discharge: 2020-11-23 | Payer: MEDICARE | Attending: Cardiovascular Disease | Primary: Family Medicine

## 2020-11-23 DIAGNOSIS — E669 Obesity, unspecified: Secondary | ICD-10-CM

## 2020-11-23 NOTE — Telephone Encounter (Signed)
Called pt back and let him know that he could use a clear flavoring for his prep.  TL

## 2020-11-23 NOTE — Progress Notes (Signed)
[facility] History & Physical                 Reason for Visit: Establish care    Subjective:     Patient is a 65 y.o. male with a PMH of CAD status post CABG (LIMA to LAD and SVG to diagonal in December 2015), statin intolerance, hypertension, hyperlipidemia, and DVT who presents as a referral to establish care.  The patient had an echocardiogram in October 2020 that was noted to demonstrate a normal EF.  The patient reports that he moved from [place] to [place] area where his daughters live.  He denies angina.  The patient reports chronic dyspnea on exertion.  He reports that dyspnea on exertion occurs after "running a mile".  The patient denies palpitations.    Past Medical History:   Diagnosis Date    Hypertension     Sleep apnea       Past Surgical History:   Procedure Laterality Date    BACK SURGERY  2021    has had 3 different ones last was in 2021    CARDIAC SURGERY      CHOLECYSTECTOMY      EYE SURGERY      FRACTURE SURGERY      TOTAL HIP ARTHROPLASTY        No family history on file.   Social History     Tobacco Use    Smoking status: Never    Smokeless tobacco: Never   Substance Use Topics    Alcohol use: Not on file      Allergies   Allergen Reactions    Hydrocodone-Acetaminophen Anxiety and Itching    Losartan Shortness Of Breath    Amlodipine Other (See Comments)     Other reaction(s): Other (See Comments)  Fatigue   Fatigue       Rosuvastatin Other (See Comments)     Other reaction(s): Constipation  Fatigue, constipation  Other reaction(s): Fatigue    Atorvastatin Diarrhea    Ezetimibe      Other reaction(s): Constipation, Other (See Comments)  Constipation and decreased urine output  Constipation and decreased urine output  Other reaction(s): Constipation, Urine output low           ROS:  No obvious pertinent positives on review of systems except for what was outlined above.       Objective:       BP 130/80    Pulse 73    Ht 6\' 1"  (1.854 m)    Wt 282 lb (127.9 kg)    BMI 37.21 kg/m??      BP Readings from Last 3 Encounters:   11/23/20 130/80   11/07/20 (!) 153/81   10/30/20 120/70       Wt Readings from Last 3 Encounters:   11/23/20 282 lb (127.9 kg)   11/07/20 283 lb (128.4 kg)   10/30/20 283 lb 9.6 oz (128.6 kg)       General/Constitutional:   Alert and oriented x 3, no acute distress  HEENT:   normocephalic, atraumatic, moist mucous membranes  Neck:   No JVD or carotid bruits bilaterally  Cardiovascular:   regular rate and rhythm, no rub/gallop appreciated  Pulmonary:   clear to auscultation bilaterally, no respiratory distress  Abdomen:   soft, non-tender, non-distended  Ext:   No sig LE edema bilaterally  Skin:  warm and dry, no obvious rashes seen  Neuro:   no obvious sensory or motor deficits  Psychiatric:  normal mood and affect    ECG:   Sinus rhythm  PACs  PRWP  Heart rate 73 bpm    Data Review:   Lab Results   Component Value Date    CHOL 157 11/02/2020     Lab Results   Component Value Date    TRIG 102 11/02/2020     Lab Results   Component Value Date    HDL 41 11/02/2020     Lab Results   Component Value Date    LDLCALC 95.6 11/02/2020     Lab Results   Component Value Date    LABVLDL 20.4 11/02/2020     Lab Results   Component Value Date    CHOLHDLRATIO 3.8 11/02/2020        Lab Results   Component Value Date/Time    NA 136 11/02/2020 08:16 AM    K 4.5 11/02/2020 08:16 AM    CL 107 11/02/2020 08:16 AM    CO2 26 11/02/2020 08:16 AM    BUN 17 11/02/2020 08:16 AM    CREATININE 1.10 11/02/2020 08:16 AM    GLUCOSE 106 11/02/2020 08:16 AM    CALCIUM 9.1 11/02/2020 08:16 AM         Lab Results   Component Value Date    ALT 43 11/02/2020    AST 32 11/02/2020        Assessment/Plan:   1. Obesity (BMI 30-39.9)  - Educated on Mediterranean diet and exercise    2. History of DVT (deep vein thrombosis)  - Patient on indefinite anticoagulation with Xarelto (reduced intensity dosing for prophylaxis against venous thromboembolism recurrence)  - Refer to hematology to assess need for continued  anticoagulation    3. Hx of CABG  - Continue with baby aspirin daily  - Patient statin intolerant    4. Hypertension, unspecified type  - Well-controlled  - Currently on lisinopril    5. Hyperlipidemia, unspecified hyperlipidemia type  - Patient statin intolerant    6. Chronic dyspnea  - Patient reports having chronic dyspnea on exertion that preceded his CABG  - Echocardiogram in October 2020 noted a normal EF  - Pertinent negatives include angina  - Continue to follow-up with PCP    F/U: 12 months (patient preference)    [name], MD

## 2020-11-23 NOTE — Telephone Encounter (Signed)
Patient called and wanted to know if he can use flavoring with the prep. Please call him back at [phone]

## 2020-11-23 NOTE — Other (Addendum)
Patient verified name, DOB, and procedure.    Type: 1a; abbreviated assessment per anesthesia guidelines    Labs per anesthesia: none    Instructed pt that they will be notified the day before their procedure by the GI Lab for time of arrival if their procedure is [facility] and Pre-op for [facility]. Arrival times should be called by 5 pm. If no phone is received the patient should contact their respective hospital. The GI lab telephone number is [phone] and ES Pre-op is [phone].     Follow diet and prep instructions per office including NPO status.  If patient has NOT received instructions from office patient is advised to call surgeon office, verbalizes understanding.    Bath or shower the night before and the am of surgery with non-mositurizing soap. No lotions, oils, powders, cologne on skin. No make up, eye make up or jewelry. Wear loose fitting comfortable, clean clothing.     Must have adult present in building the entire time .     Medications for the day of procedure Allopurinol, Astelin spray, Fexofenadine, Gabapentin  patient to hold Lisinopril on DOS.  Follow instructions from Dr. Lajean Saver for holding Aspirin and Xarelto.    The following discharge instructions reviewed with patient: medication given during procedure may cause drowsiness for several hours, therefore, do not drive or operate machinery for remainder of the day. You may not drink alcohol on the day of your procedure, please resume regular diet and activity unless otherwise directed. You may experience abdominal distention for several hours that is relieved by the passage of gas. Contact your physician if you have any of the following: fever or chills, severe abdominal pain or excessive amount of bleeding or a large amount when having a bowel movement. Occasional specks of blood with bowel movement would not be unusual.

## 2020-11-29 ENCOUNTER — Inpatient Hospital Stay: Payer: MEDICARE

## 2020-11-29 MED ORDER — LIDOCAINE HCL (PF) 2 % IJ SOLN
2 % | INTRAMUSCULAR | Status: AC
Start: 2020-11-29 — End: ?

## 2020-11-29 MED ORDER — PROPOFOL 200 MG/20ML IV EMUL
20020 MG/20ML | INTRAVENOUS | Status: AC
Start: 2020-11-29 — End: ?

## 2020-11-29 MED ORDER — LACTATED RINGERS IV SOLN
INTRAVENOUS | Status: DC
Start: 2020-11-29 — End: 2020-11-29
  Administered 2020-11-29 (×2): via INTRAVENOUS

## 2020-11-29 MED ORDER — MIDAZOLAM HCL (PF) 2 MG/2ML IJ SOLN
2 MG/ML | Freq: Once | INTRAMUSCULAR | Status: DC | PRN
Start: 2020-11-29 — End: 2020-11-29

## 2020-11-29 MED ORDER — NORMAL SALINE FLUSH 0.9 % IV SOLN
0.9 % | INTRAVENOUS | Status: DC | PRN
Start: 2020-11-29 — End: 2020-11-29

## 2020-11-29 MED ORDER — PROPOFOL 200 MG/20ML IV EMUL
20020 MG/20ML | INTRAVENOUS | Status: DC | PRN
Start: 2020-11-29 — End: 2020-11-29
  Administered 2020-11-29: 17:00:00 180 via INTRAVENOUS
  Administered 2020-11-29: 17:00:00 80 via INTRAVENOUS

## 2020-11-29 MED ORDER — NORMAL SALINE FLUSH 0.9 % IV SOLN
0.9 % | Freq: Two times a day (BID) | INTRAVENOUS | Status: DC
Start: 2020-11-29 — End: 2020-11-29

## 2020-11-29 MED ORDER — FENTANYL CITRATE (PF) 100 MCG/2ML IJ SOLN
100 MCG/2ML | Freq: Once | INTRAMUSCULAR | Status: DC | PRN
Start: 2020-11-29 — End: 2020-11-29

## 2020-11-29 MED ORDER — LIDOCAINE HCL (PF) 2 % IJ SOLN
2 % | INTRAMUSCULAR | Status: DC | PRN
Start: 2020-11-29 — End: 2020-11-29
  Administered 2020-11-29: 17:00:00 40 via INTRAVENOUS

## 2020-11-29 MED FILL — DIPRIVAN 200 MG/20ML IV EMUL: 200 MG/20ML | INTRAVENOUS | Qty: 20

## 2020-11-29 MED FILL — LIDOCAINE HCL (PF) 2 % IJ SOLN: 2 % | INTRAMUSCULAR | Qty: 5

## 2020-11-29 NOTE — Procedures (Signed)
Operative Report    Patient: Chad Rosario MRN: [id number]      Date of Birth: 03-21-1956  Age: 65 y.o.  Sex: male            Indications:  screening for colon cancer @ICD10DX @     Preoperative Evaluation: The patient was evaluated prior to the procedure in the GI lab admission area, the patient ASA was recorded .  Consent was obtained from the patient with the risk of perforation bleeding and aspiration.    Anesthesia: IVA-per anesthesia    Complications: None; patient tolerated the procedure well.    EBL -insignificant      Procedure: The patient was sedated in the left lateral decubitus position.  Scope was advanced from the rectum to the cecum.  Evaluation was performed to the cecum twice.  The scope was withdrawn to the rectum, retroflexed view was performed.  The rectal exam was normal.  Preparation was fair Boston score of 1/2/2:5 .    Findings:   Exam to the cecum.  2 passes were performed to the cecum.  Retroflexion was performed in the cecum.  Preparation was fair with a Boston score of 5.  There was 3 polyps in the ascending colon ulcerated ranging in size from 8 to 9 mm hot snared.  A transverse 6 mm polyp hot snared and retrieved.  Normal descending sigmoid and rectal mucosa.      Postoperative Diagnosis: Ascending and transverse colon polyps    [id number] Colonoscopy, Flexible; with removal of tumor(s), polyp(s), or other lesion(s) by snare technique      Recommendations:   - Await pathology.repeat exam in 2 years      Signed By:  , MD     November 29, 2020

## 2020-11-29 NOTE — Discharge Instructions (Addendum)
Gastrointestinal Colonoscopy/Flexible Sigmoidoscopy - Lower Exam Discharge Instructions  Call Dr. Lajean Saver at [phone] for any problems or questions.  Contact the doctor???s office for follow up appointment as directed  Medication may cause drowsiness for several hours, therefore, do not drive or operate machinery for remainder of the day.  No alcohol today.  Do not make any important decisions such signing legal paperwork.  Ordinarily, you may resume regular diet and activity after exam unless otherwise specified by your physician.  Because of air put into your colon during exam, you may experience some abdominal distension, relieved by the passage of gas, for several hours.  Contact your physician if you have any of the following:  Excessive amount of bleeding - large amount when having a bowel movement.  Occasional specks of blood with bowel movement would not be unusual.  Severe abdominal pain  Fever or Chills      Any additional instructions:   Restart Eliquis tomorrow.  Repeat colonoscopy in 2 years.

## 2020-11-29 NOTE — Progress Notes (Addendum)
Discharge instructions reviewed with pt and driver. Opportunities for questions and clarification provided. IV removed.

## 2020-11-29 NOTE — Progress Notes (Signed)
Attempted to call for post procedure call no answer

## 2020-11-29 NOTE — Interval H&P Note (Signed)
Update History & Physical    The patient's History and Physical of August 16,  was reviewed with the patient and I examined the patient. There was no change. The surgical site was confirmed by the patient and me.     Plan: The risks, benefits, expected outcome, and alternative to the recommended procedure have been discussed with the patient. Patient understands and wants to proceed with the procedure.     Electronically signed by Drucie Opitz, MD on 11/29/2020 at 12:55 PM

## 2020-11-29 NOTE — Anesthesia Post-Procedure Evaluation (Signed)
Department of Anesthesiology  Postprocedure Note    Patient: Chad Rosario  MRN: [id number]  Birthdate: 1955/04/12  Date of evaluation: 11/29/2020      Procedure Summary     Date: 11/29/20 Room / Location: [facility] / [facility]    Anesthesia Start: 1323 Anesthesia Stop: 1353    Procedure: COLONOSCOPY POLYPECTOMY HOT SNARE (Lower GI Region) Diagnosis:       Encounter for screening colonoscopy      (Encounter for screening colonoscopy [Z12.11])    Providers: Drucie Opitz, MD Responsible Provider: Neta Mends, MD    Anesthesia Type: General ASA Status: 3          Anesthesia Type: General    Aldrete Phase I:      Aldrete Phase II: Aldrete Score: 10      Anesthesia Post Evaluation    Patient location during evaluation: PACU  Patient participation: complete - patient participated  Level of consciousness: awake and alert  Airway patency: patent  Nausea & Vomiting: no nausea  Complications: no  Cardiovascular status: blood pressure returned to baseline  Respiratory status: acceptable  Hydration status: euvolemic

## 2020-11-29 NOTE — Anesthesia Pre-Procedure Evaluation (Signed)
Department of Anesthesiology  Preprocedure Note       Name:  Chad Rosario   Age:  65 y.o.  DOB:  1955/06/15                                          MRN:  [id number]         Date:  11/29/2020      Surgeon: Moishe Spice):  Drucie Opitz, MD    Procedure: Procedure(s):  COLORECTAL CANCER SCREENING, NOT HIGH RISK    Medications prior to admission:   Prior to Admission medications    Medication Sig Start Date End Date Taking? Authorizing Provider   Acetaminophen 500 MG CAPS Take 1,000 mg by mouth as needed 04/04/20   Historical Provider, MD   allopurinol (ZYLOPRIM) 300 MG tablet TAKE 1 TABLET BY MOUTH  DAILY 12/27/19   Historical Provider, MD   Alpha-Lipoic Acid 200 MG CAPS Take 300 mg by mouth    Historical Provider, MD   amitriptyline (ELAVIL) 10 MG tablet Take 10 mg nightly 08/02/20   Historical Provider, MD   aspirin 81 MG EC tablet Take 81 mg by mouth in the morning. 06/13/20   Historical Provider, MD   azelastine (ASTELIN) 0.1 % nasal spray 1 spray by Nasal route as needed 01/21/20   Historical Provider, MD   colchicine (MITIGARE) 0.6 MG capsule Take 0.6 mg by mouth as needed 10/18/20   Historical Provider, MD   fexofenadine (ALLEGRA) 180 MG tablet Take 180 mg by mouth in the morning.    Historical Provider, MD   gabapentin (NEURONTIN) 300 MG capsule Take 300 capsules by mouth in the morning, at noon, and at bedtime. Take 2 capsules 3 times a day 06/29/20   Historical Provider, MD   lisinopril (PRINIVIL;ZESTRIL) 20 MG tablet Take by mouth daily 06/13/20   Historical Provider, MD   rivaroxaban (XARELTO) 10 MG TABS tablet Take 10 mg by mouth in the morning. 06/13/20   Historical Provider, MD   zolpidem (AMBIEN) 10 MG tablet TAKE 1 TABLET BY MOUTH  DAILY AS NEEDED 07/24/20   Historical Provider, MD       Current medications:    Current Facility-Administered Medications   Medication Dose Route Frequency Provider Last Rate Last Admin   ??? fentaNYL (SUBLIMAZE) injection 100 mcg  100 mcg IntraVENous Once PRN Barbaraann Faster, MD       ???  lactated ringers infusion   IntraVENous Continuous Barbaraann Faster, MD 125 mL/hr at 11/29/20 1232 New Bag at 11/29/20 1232   ??? sodium chloride flush 0.9 % injection 5-40 mL  5-40 mL IntraVENous 2 times per day Barbaraann Faster, MD       ??? sodium chloride flush 0.9 % injection 5-40 mL  5-40 mL IntraVENous PRN Barbaraann Faster, MD       ??? midazolam PF (VERSED) injection 2 mg  2 mg IntraVENous Once PRN Barbaraann Faster, MD           Allergies:    Allergies   Allergen Reactions   ??? Hydrocodone-Acetaminophen Anxiety and Itching   ??? Losartan Shortness Of Breath   ??? Amlodipine Other (See Comments)     Other reaction(s): Other (See Comments)  Fatigue   Fatigue      ??? Rosuvastatin Other (See Comments)     Other reaction(s): Constipation  Fatigue, constipation  Other reaction(s): Fatigue   ???  Atorvastatin Diarrhea   ??? Ezetimibe      Other reaction(s): Constipation, Other (See Comments)  Constipation and decreased urine output  Constipation and decreased urine output  Other reaction(s): Constipation, Urine output low         Problem List:    Patient Active Problem List   Diagnosis Code   ??? Encounter for screening colonoscopy Z12.11   ??? Obesity (BMI 30-39.9) E66.9   ??? History of DVT (deep vein thrombosis) Z86.718   ??? 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 Hypertension I10   ??? Hyperlipidemia E78.5   ??? Chronic dyspnea R06.09       Past Medical History:        Diagnosis Date   ??? Hypertension     controlled with meds   ??? Sleep apnea     cpap at night       Past Surgical History:        Procedure Laterality Date   ??? BACK SURGERY  2021    has had 3 different ones last was in 2021   ??? CARDIAC SURGERY      CABG   ??? CHOLECYSTECTOMY     ??? EYE SURGERY Left     cataract   ??? FRACTURE SURGERY Left     tib-fib fracture   ??? TOTAL HIP ARTHROPLASTY Left        Social History:    Social History     Tobacco Use   ??? Smoking status: Never   ??? Smokeless tobacco: Never   Substance Use Topics   ??? Alcohol use: Yes     Comment: socially                                 Counseling given: Not Answered      Vital Signs (Current):   Vitals:    11/23/20 1040   Weight: 275 lb (124.7 kg)   Height: 6\' 1"  (1.854 m)                                              BP Readings from Last 3 Encounters:   11/23/20 130/80   11/07/20 (!) 153/81   10/30/20 120/70       NPO Status:                                                                                 BMI:   Wt Readings from Last 3 Encounters:   11/23/20 275 lb (124.7 kg)   11/23/20 282 lb (127.9 kg)   11/07/20 283 lb (128.4 kg)     Body mass index is 36.28 kg/m??.    CBC:   Lab Results   Component Value Date/Time    WBC 6.6 11/02/2020 08:16 AM    RBC 5.62 11/02/2020 08:16 AM    HGB 15.4 11/02/2020 08:16 AM    HCT 46.2 11/02/2020 08:16 AM    MCV 82.2 11/02/2020 08:16 AM    RDW 18.0 11/02/2020 08:16 AM  PLT 114 11/02/2020 08:16 AM       CMP:   Lab Results   Component Value Date/Time    NA 136 11/02/2020 08:16 AM    K 4.5 11/02/2020 08:16 AM    CL 107 11/02/2020 08:16 AM    CO2 26 11/02/2020 08:16 AM    BUN 17 11/02/2020 08:16 AM    CREATININE 1.10 11/02/2020 08:16 AM    GFRAA >60 11/02/2020 08:16 AM    LABGLOM >60 11/02/2020 08:16 AM    GLUCOSE 106 11/02/2020 08:16 AM    PROT 7.6 11/02/2020 08:16 AM    CALCIUM 9.1 11/02/2020 08:16 AM    BILITOT 0.8 11/02/2020 08:16 AM    ALKPHOS 69 11/02/2020 08:16 AM    AST 32 11/02/2020 08:16 AM    ALT 43 11/02/2020 08:16 AM       POC Tests: No results for input(s): POCGLU, POCNA, POCK, POCCL, POCBUN, POCHEMO, POCHCT in the last 72 hours.    Coags: No results found for: PROTIME, INR, APTT    HCG (If Applicable): No results found for: PREGTESTUR, PREGSERUM, HCG, HCGQUANT     ABGs: No results found for: PHART, PO2ART, PCO2ART, HCO3ART, BEART, O2SATART     Type & Screen (If Applicable):  No results found for: LABABO, LABRH    Drug/Infectious Status (If Applicable):  No results found for: HIV, HEPCAB    COVID-19 Screening (If Applicable): No results found for: COVID19        Anesthesia Evaluation  Patient  summary reviewed and Nursing notes reviewed  Airway: Mallampati: I  TM distance: >3 FB   Neck ROM: full  Mouth opening: > = 3 FB   Dental: normal exam         Pulmonary: breath sounds clear to auscultation  (+) sleep apnea: on CPAP,                             Cardiovascular:  Exercise tolerance: good (>4 METS),   (+) hypertension:, CABG/stent:,         Rhythm: regular  Rate: normal                    Neuro/Psych:               GI/Hepatic/Renal:             Endo/Other:                     Abdominal:             Vascular:   + DVT, .       Other Findings:           Anesthesia Plan      TIVA     ASA 3       Induction: intravenous.      Anesthetic plan and risks discussed with patient.                        Patrecia Pour IV, MD   11/29/2020

## 2020-11-30 ENCOUNTER — Encounter: Payer: MEDICARE | Attending: Family Medicine | Primary: Family Medicine

## 2020-12-12 ENCOUNTER — Encounter: Payer: MEDICARE | Attending: Family Medicine | Primary: Family Medicine

## 2020-12-15 NOTE — Progress Notes (Signed)
[name] Abstract      Reason for Referral:  History of DVT    Referring Provider:  Dr. Amelia Jo, [facility]     Primary Care Provider:  Dr. Scherrie Gerlach, [facility]     Family History of Cancer/Hematologic Disorders:  Negative     Presenting Symptoms:   Establish care, recently moved from [facility]    Narrative with recent with Results/Procedures/Biopsies and Dates completed:  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  Surgical history includes back surgery x 3 (last one 2021), left total hip, left tib-fib fracture, left cataract, and CABG x 2 (2015).  Negative family history.  He denies use of any tobacco products or drugs but does occasionally consume alcohol.      June of this year patient relocated to [place] from [place] to be close to his children.  He has established care with PCP and cardiology already.  Patient has also completed screening colonoscopy with Dr. Lajean Saver on 11/29/20 with history of colon polyps.      Patient has been on anticoagulation therapy since original DVT detected in July 2017. Most recent US bilateral legs was 03/21/20 - normal left leg and chronic right leg DVT stable from prior 2 exams.  His current dose is Xarelto 10 mg QAM.      03/21/20 [facility] US Venous bilateral legs  COMPARISON: Venous Doppler 03/12/2020. Venous Doppler 06/17/2019.     INDICATION: evaluation of DVT ; please compare to ultrasound on 03/12/20 and 06/17/19 - Is this chronic DVT on 03/12/20?,     FINDINGS: There is nonocclusive thrombus in the mid right femoral vein extending down into the distal right femoral vein. This appearance is similar to the 2 previous examinations and this may be chronic thrombus. There is also some nonocclusive thrombus in the right popliteal vein. This is demonstrated improvement when compared to  the 2 prior examinations. The right common femoral vein and right calf veins are patent.     The left common femoral vein, femoral vein, popliteal vein, and calf veins are patent with normal spontaneous venous flow and good compressibility. No evidence of deep venous thrombosis on the left.     INCIDENTAL FINDINGS:  None       IMPRESSION:  Nonocclusive thrombus in the right mid and distal femoral vein and the right popliteal vein. The thrombus in the right mid femoral vein is similar in appearance to the prior examinations but the popliteal vein thrombus has improved.     Normal left lower extremity deep venous system    Notes from Referring Provider:  11/23/20 progress note Dr. Ardell Isaacs  History of DVT (deep vein thrombosis)  - Patient on indefinite anticoagulation with Xarelto (reduced intensity dosing for prophylaxis against venous thromboembolism recurrence)  - Refer to hematology to assess need for continued anticoagulation    Other Pertinent Information:  Covid vaccination - [facility] 07/21/19    Presented at Tumor Board:  No

## 2020-12-18 ENCOUNTER — Inpatient Hospital Stay: Admit: 2020-12-18 | Payer: MEDICARE | Primary: Family Medicine

## 2020-12-18 ENCOUNTER — Ambulatory Visit: Admit: 2020-12-18 | Discharge: 2020-12-18 | Payer: MEDICARE | Attending: Internal Medicine | Primary: Family Medicine

## 2020-12-18 DIAGNOSIS — Z86718 Personal history of other venous thrombosis and embolism: Secondary | ICD-10-CM

## 2020-12-18 LAB — COMPREHENSIVE METABOLIC PANEL
ALT: 38 U/L (ref 12–65)
AST: 31 U/L (ref 15–37)
Albumin/Globulin Ratio: 0.9 — ABNORMAL LOW (ref 1.2–3.5)
Albumin: 3.5 g/dL (ref 3.2–4.6)
Alk Phosphatase: 65 U/L (ref 50–136)
Anion Gap: 6 mmol/L (ref 4–13)
BUN: 18 MG/DL (ref 8–23)
CO2: 27 mmol/L (ref 21–32)
Calcium: 9.4 MG/DL (ref 8.3–10.4)
Chloride: 106 mmol/L (ref 101–110)
Creatinine: 1.2 MG/DL (ref 0.8–1.5)
GFR African American: >60 mL/min/{1.73_m2} (ref >60)
GFR Non-African American: >60 mL/min/{1.73_m2} (ref >60)
Globulin: 3.9 g/dL — ABNORMAL HIGH (ref 2.3–3.5)
Glucose: 96 mg/dL (ref 65–100)
Potassium: 4.2 mmol/L (ref 3.5–5.1)
Sodium: 139 mmol/L (ref 136–145)
Total Bilirubin: 0.7 MG/DL (ref 0.2–1.1)
Total Protein: 7.4 g/dL (ref 6.3–8.2)

## 2020-12-18 LAB — CBC WITH AUTO DIFFERENTIAL
Absolute Eos #: 0.7 10*3/uL (ref 0.0–0.8)
Absolute Immature Granulocyte: 0 10*3/uL (ref 0.0–0.5)
Absolute Lymph #: 1.5 10*3/uL (ref 0.5–4.6)
Absolute Mono #: 0.6 10*3/uL (ref 0.1–1.3)
Basophils Absolute: 0.1 10*3/uL (ref 0.0–0.2)
Basophils: 1 % (ref 0.0–2.0)
Eosinophils %: 11 % — ABNORMAL HIGH (ref 0.5–7.8)
Hematocrit: 43.7 %
Hemoglobin: 14.6 g/dL (ref 13.6–17.2)
Immature Granulocytes: 1 % (ref 0.0–5.0)
Lymphocytes: 23 % (ref 13–44)
MCH: 27.5 PG (ref 26.1–32.9)
MCHC: 33.4 g/dL (ref 31.4–35.0)
MCV: 82.3 FL (ref 79.6–97.8)
MPV: 10.8 FL (ref 9.4–12.3)
Monocytes: 9 % (ref 4.0–12.0)
Platelets: 112 10*3/uL — ABNORMAL LOW (ref 150–450)
RBC: 5.31 M/uL (ref 4.23–5.6)
RDW: 16.6 % — ABNORMAL HIGH (ref 11.9–14.6)
Seg Neutrophils: 56 % (ref 43–78)
Segs Absolute: 3.7 10*3/uL (ref 1.7–8.2)
WBC: 6.6 10*3/uL (ref 4.3–11.1)
nRBC: 0 10*3/uL (ref 0.0–0.2)

## 2020-12-18 NOTE — Progress Notes (Addendum)
[facility]: Office Visit New Patient H & P    Reason for visit:  History of DVT      Oncologic overview: per Mina visit dated 07/07/19:    Secondary erythrocytosis  ?? Hx of hemoglobin as high as 19  ?? JAK2 negative; splenomegaly - mild  ?? Bone marrow consistent with secondary erythrocytosis 7/16; JAK2, CALr, MPL on marrow negative    Splenomegaly with thrombocytopenia  ?? Negative bone marrow 8/16  ?? Hepatitis panel pending 12/16    DVT RLE 7/17  ?? There was not enough blood to do the complete antiphospholipid testing. The lupus anticoagulant was positive which may be artifactual related to the presence of xarelto. Both factor V Leiden and the prothrombin gene mutation were absent      DIAGNOSIS     "BONE MARROW CORE BIOPSY, CLOT SECTION, ASPIRATE AND PERIPHERAL BLOOD   SMEAR:   - MILDLY HYPERCELLULAR MARROW WITH ERYTHROID HYPERPLASIA; SEE NOTE   - NO SIGNIFICANT MARROW FIBROSIS IDENTIFIED ON RETICULIN STAIN     NOTE: LABORATORY STUDIES ARE NOTABLE FOR AN ELEVATED ERYTHROPOIETIN   (28.2 MIU/ML, REF 2.6-18.5), AND NEGATIVE FOR MUTATIONS OF JAK2 V617F   ([facility]), JAK2 EXONS 12-14, CALRETICULIN, AND MPL. OVERALL, THE   FEATURES ARE MOST IN KEEPING WITH SECONDARY ERYTHROCYTOSIS.     THE CASE WAS DISCUSSED WITH DR. HINES AT 9:56 AM, 11/29/2014. "    History of Present Illness:  Mr. 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    In June 2022, patient relocated to [place] from [place] to be closer to her children.  He has establish care with PCP and cardiology already.  He screening colonoscopy was completed by Dr. Marcelene Butte on  11/29/2020 with history of colon polyps.    Patient's presents today to discuss management of chronic anticoagulation.  He has been on anticoagulation for right lower extremity DVT diagnosed in July 2017.  He continues to take Xarelto 10 mg daily.  Based on history of coronary artery disease, he also takes aspirin 81 mg oral daily.    On evaluation today, he denies lower extremity pain or persistent swelling.  Right leg swells after a long drive.  He denies chest pain, unusual shortness of breath, nausea, vomiting, gum bleeds/nosebleeds/bloody stool/hematuria, abdominal pain, diarrhea or dysuria.  Denies fevers, drenching night sweats, early satiety, unintentional weight loss.  No falls, head trauma.    C/o mild macular rash on the dorsal aspect of left foot.  Also has chronic neuropathy involving bilateral feet-unchanged    Review of Systems:  14 point ROS was negative except as mentioned above.    ECOG PERFORMANCE STATUS - 0-Fully active, able to carry on all pre-disease performance without restriction.    Pain - /10. None/Minimal pain - not affecting QOL     Fatigue - No flowsheet data found.  Distress - No flowsheet data found.         Reviewed and updated this visit by provider:  Tobacco   Allergies   Meds   Problems   Med Hx   Surg Hx   Fam Hx           Current Outpatient Medications  Medication Sig Dispense Refill    Acetaminophen 500 MG CAPS Take 1,000 mg by mouth as needed      allopurinol (ZYLOPRIM) 300 MG tablet TAKE 1 TABLET BY MOUTH  DAILY      Alpha-Lipoic Acid 200 MG CAPS Take 300 mg by mouth      amitriptyline (ELAVIL) 10 MG tablet Take 10 mg nightly      aspirin 81 MG EC tablet Take 81 mg by mouth in the morning.      azelastine (ASTELIN) 0.1 % nasal spray 1 spray by Nasal route as needed      fexofenadine (ALLEGRA) 180 MG tablet Take 180 mg by mouth in the morning.      gabapentin (NEURONTIN) 300 MG capsule Take 300 capsules by mouth in the morning, at noon, and at bedtime. Take 2 capsules 3 times a  day      lisinopril (PRINIVIL;ZESTRIL) 20 MG tablet Take by mouth daily      rivaroxaban (XARELTO) 10 MG TABS tablet Take 10 mg by mouth in the morning.      zolpidem (AMBIEN) 10 MG tablet TAKE 1 TABLET BY MOUTH  DAILY AS NEEDED      colchicine (MITIGARE) 0.6 MG capsule Take 0.6 mg by mouth as needed       No current facility-administered medications for this visit.        OBJECTIVE:  BP (!) 157/74 (Site: Left Upper Arm, Position: Sitting)    Pulse 68    Temp 98.2 ??F (36.8 ??C) (Oral)    Resp 18    Ht '6\' 1"'  (1.854 m)    Wt 285 lb (129.3 kg)    SpO2 98%    BMI 37.60 kg/m??     Physical Exam:  Patient alert and oriented x 3, in no acute distress  Integumentary: No Pallor, no icterus  HEENT: Moist mucous membranes, normal oropharynx  Cardiovascular:RRR, S1, S2 present, no m/r/g   Lungs: Clear to auscultation, no rales or wheezing, no accessory muscle use  Abdomen: Soft, and non-tender on palpation, no organomegaly, bowel sounds audible  Extremities: No peripheral edema, no joint swelling, dorsalis pedis pulses palpable bilaterally.  Neurological: patient can follow commands and move all extremities    Labs:  Lab Results   Component Value Date    WBC 6.6 11/02/2020    HGB 15.4 11/02/2020    HCT 46.2 11/02/2020    MCV 82.2 11/02/2020    PLT 114 (L) 11/02/2020       Lab Results   Component Value Date    LYMPHOPCT 23 11/02/2020    LYMPHSABS 1.5 11/02/2020    MONOPCT 11 11/02/2020    MONOSABS 0.7 11/02/2020    EOSABS 0.8 11/02/2020    BASOPCT 1 11/02/2020       Lab Results   Component Value Date    NA 136 (L) 11/02/2020    K 4.5 11/02/2020    CL 107 11/02/2020    CO2 26 11/02/2020    BUN 17 11/02/2020    CREATININE 1.10 11/02/2020    GLUCOSE 106 (H) 11/02/2020    CALCIUM 9.1 11/02/2020    PROT 7.6 11/02/2020    LABALBU 3.9 11/02/2020    BILITOT 0.8 11/02/2020    ALKPHOS 69 11/02/2020    AST 32 11/02/2020    ALT 43 11/02/2020    LABGLOM >60 11/02/2020    GFRAA >60 11/02/2020    GLOB 3.7 (H) 11/02/2020       Component  12/16/2019  12/04/2018    PSA 0.9 0.7          Imaging:  No results found.    Problems:  1. History of DVT (deep vein thrombosis), right lower extremity   -Long-term anticoagulation with Xarelto  -Chronic mild thrombocytopenia, without evidence of excessive bleeding-this has been worked up in the past.  He has had a bone marrow biopsy in 2017.  Historically, platelet counts have remained relatively stable and above 100.  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      I had an extensive discussion with the patient and reviewed differential diagnosis of chronic thrombocytopenia and management approach to long-term anticoagulation.    Chronic venous thrombi are generally believed to present lesser risk of clot propagation and embolism as at that point they are fibrin-rich and organized in the vessel wall.      PLAN:    Basic labs  Follow platelets on serial CBCs.  If it worsens/other cytopenias develop, may consider repeating a bone marrow biopsy.  Will hold anticoagulation if platelet counts drop below 50,000 or he develops active bleeding.  Venous duplex b/l LE-review for compressibility and presence of collaterals in the area of right LE chronic DVT if still persistent.   If no acute DVT is seen on the above study, it is felt reasonable to decrease the dose of Xarelto for 5 mg p.o. daily.  Patient verbalized understanding of signs and symptoms of worsening VTE and notes to seek emergency medical care should he develop any concern for that.  Age appropriate cancer screening discussed.  Last PSA 0.9 in September 2021.    ROV with labs prior.     All questions were answered to the best of my abilities.            Howie Ill, MD  [facility] [place] [address]  Office : [phone]  Fax : [phone]    Elements of this note have been dictated using speech recognition software. As a result, errors of speech recognition may have  occurred.

## 2020-12-18 NOTE — Patient Instructions (Signed)
Patient Instructions from Today's Visit    Reason for Visit:  New patient - DVT    Diagnosis Information:  https://www.cancer.net/about-us/asco-answers-patient-education-materials/asco-answers-fact-sheets    Plan:  Continuing anticoagulation recommended.  Will be monitoring platelet count.  We do not recommend continuing blood thinner if your platelet count drops below 50,000.   We have ordered an US of the legs - if the clot is the same or improved, we could consider decreasing the dose.  Please call us if you have any questions or concerns before your next visit.     Follow Up:  Follow up in 8-10 weeks with Dr. Deatra Canter, with labs prior.     Recent Lab Results:  No visits with results within 3 Day(s) from this visit.   Latest known visit with results is:   Nurse Only on 11/02/2020   Component Date Value Ref Range Status    WBC, UA 11/02/2020 0-4 (A)  NORMAL /hpf Final    RBC, UA 11/02/2020 0-5 (A)  NORMAL /hpf Final    Epithelial Cells UA 11/02/2020 0-5 (A)  NORMAL /hpf Final    BACTERIA, URINE 11/02/2020 Negative (A)  NORMAL /hpf Final    Casts 11/02/2020 0-2 (A)  NORMAL /lpf Final    Uric Acid 11/02/2020 6.1 (A)  2.6 - 6.0 MG/DL Final    Iron 11/02/2020 43  35 - 150 ug/dL Final    Comment: Known Interfering Substances section:  "Iron values may be falsely elevated in  serum samples from patients with  anticoagulants (e.g., hemodialysis patients)."  Limitations of Procedure section:  "Turbidity resulting from precipitation of  fibrinogen in the serum of patients treated  with anticoagulants (e.g. hemodialysis  patients) may cause spuriously elevated  iron results."      Cholesterol, Total 11/02/2020 157  <200 MG/DL Final    Comment: Borderline High: 200-239 mg/dL  High: Greater than or equal to 240 mg/dL      Triglycerides 11/02/2020 102  35 - 150 MG/DL Final    Comment: Borderline High: 150-199 mg/dL, High: 200-499 mg/dL  Very High: Greater than or equal to 500 mg/dL      HDL 11/02/2020 41  40 - 60 MG/DL Final     LDL Calculated 11/02/2020 95.6  <100 MG/DL Final    Comment: Near Optimal: 100-129 mg/dL  Borderline High: 130-159, High: 160-189 mg/dL  Very High: Greater than or equal to 190 mg/dL      VLDL Cholesterol Calculated 11/02/2020 20.4  6.0 - 23.0 MG/DL Final    Chol/HDL Ratio 11/02/2020 3.8    Final    Sodium 11/02/2020 136 (A)  138 - 145 mmol/L Final    Potassium 11/02/2020 4.5  3.5 - 5.1 mmol/L Final    Chloride 11/02/2020 107  98 - 107 mmol/L Final    CO2 11/02/2020 26  21 - 32 mmol/L Final    Anion Gap 11/02/2020 3 (A)  7 - 16 mmol/L Final    Glucose 11/02/2020 106 (A)  65 - 100 mg/dL Final    BUN 11/02/2020 17  8 - 23 MG/DL Final    Creatinine 11/02/2020 1.10  0.8 - 1.5 MG/DL Final    GFR African American 11/02/2020 >60  >60 ml/min/1.19m Final    GFR Non-African American 11/02/2020 >60  >60 ml/min/1.765mFinal    Comment:   Estimated GFR is calculated using the Modification of Diet in Renal Disease (MDRD) Study equation, reported for both African Americans (GFRAA) and non-African Americans (GFRNA), and normalized to 1.7360mody surface  area. The physician must decide which value applies to the patient.  The MDRD study equation should only be used in individuals age 70 or older. It has not been validated for the following: pregnant women, patients with serious comorbid conditions,or on certain medications, or persons with extremes of body size, muscle mass, or nutritional status.      Calcium 11/02/2020 9.1  8.3 - 10.4 MG/DL Final    Total Bilirubin 11/02/2020 0.8  0.2 - 1.1 MG/DL Final    ALT 11/02/2020 43  12 - 65 U/L Final    AST 11/02/2020 32  15 - 37 U/L Final    Alk Phosphatase 11/02/2020 69  50 - 136 U/L Final    Total Protein 11/02/2020 7.6  6.3 - 8.2 g/dL Final    Albumin 11/02/2020 3.9  3.2 - 4.6 g/dL Final    Globulin 11/02/2020 3.7 (A)  2.3 - 3.5 g/dL Final    Albumin/Globulin Ratio 11/02/2020 1.1 (A)  1.2 - 3.5   Final    WBC 11/02/2020 6.6  4.3 - 11.1 K/uL Final    RBC 11/02/2020 5.62 (A)  4.23 - 5.6  M/uL Final    Hemoglobin 11/02/2020 15.4  13.6 - 17.2 g/dL Final    Hematocrit 11/02/2020 46.2  41.1 - 50.3 % Final    MCV 11/02/2020 82.2  79.6 - 97.8 FL Final    MCH 11/02/2020 27.4  26.1 - 32.9 PG Final    MCHC 11/02/2020 33.3  31.4 - 35.0 g/dL Final    RDW 11/02/2020 18.0 (A)  11.9 - 14.6 % Final    Platelets 11/02/2020 114 (A)  150 - 450 K/uL Final    MPV 11/02/2020 12.0  9.4 - 12.3 FL Final    nRBC 11/02/2020 0.00  0.0 - 0.2 K/uL Final    **Note: Absolute NRBC parameter is now reported with Hemogram**    Differential Type 11/02/2020 AUTOMATED    Final    Seg Neutrophils 11/02/2020 53  43 - 78 % Final    Lymphocytes 11/02/2020 23  13 - 44 % Final    Monocytes 11/02/2020 11  4.0 - 12.0 % Final    Eosinophils % 11/02/2020 12 (A)  0.5 - 7.8 % Final    Basophils 11/02/2020 1  0.0 - 2.0 % Final    Immature Granulocytes 11/02/2020 1  0.0 - 5.0 % Final    Segs Absolute 11/02/2020 3.5  1.7 - 8.2 K/UL Final    Absolute Lymph # 11/02/2020 1.5  0.5 - 4.6 K/UL Final    Absolute Mono # 11/02/2020 0.7  0.1 - 1.3 K/UL Final    Absolute Eos # 11/02/2020 0.8  0.0 - 0.8 K/UL Final    Basophils Absolute 11/02/2020 0.1  0.0 - 0.2 K/UL Final    Absolute Immature Granulocyte 11/02/2020 0.0  0.0 - 0.5 K/UL Final    Ferritin 11/02/2020 14  8 - 388 NG/ML Final         Treatment Summary has been discussed and given to patient: N/A        -------------------------------------------------------------------------------------------------------------------  Please call our office at [phone] if you have any  of the following symptoms:   Fever of 100.5 or greater  Chills  Shortness of breath  Swelling or pain in one leg    After office hours an answering service is available and will contact a provider for emergencies or if you are experiencing any of the above symptoms.    Patient does express an interest  in My Chart.  My Chart log in information explained on the after visit summary printout at the [place] desk.    Welford Roche, RN

## 2020-12-25 ENCOUNTER — Inpatient Hospital Stay: Admit: 2020-12-25 | Payer: MEDICARE | Primary: Family Medicine

## 2020-12-25 DIAGNOSIS — Z86718 Personal history of other venous thrombosis and embolism: Secondary | ICD-10-CM

## 2020-12-26 NOTE — Telephone Encounter (Addendum)
Pt notified and verbalized understanding.     ----- Message from Hector Shade, MD sent at 12/25/2020  3:57 PM EDT -----  No DVT on [place] LE  Continue with xarelto 10 mg PO daily.

## 2021-01-09 ENCOUNTER — Ambulatory Visit: Admit: 2021-01-09 | Discharge: 2021-01-09 | Payer: MEDICARE | Attending: Family Medicine | Primary: Family Medicine

## 2021-01-09 DIAGNOSIS — J019 Acute sinusitis, unspecified: Secondary | ICD-10-CM

## 2021-01-09 MED ORDER — AZITHROMYCIN 250 MG PO TABS
250 MG | ORAL_TABLET | ORAL | 0 refills | Status: AC
Start: 2021-01-09 — End: 2021-01-14

## 2021-01-11 NOTE — Progress Notes (Signed)
Chad Rosario  1955-09-28 is a 65 y.o. male ,Established patient, here for evaluation of the following chief complaint(s):   Chief Complaint   Patient presents with    Follow-up     From new patient apt on 10/30/20. He has seen a CARD and HEMO since he last saw your          1. Acute bacterial sinusitis  -     azithromycin (ZITHROMAX) 250 MG tablet; Take 1 tablet by mouth See Admin Instructions for 5 days 500mg  on day 1 followed by 250mg  on days 2 - 5, Disp-6 tablet, R-0Normal  2. Non-recurrent acute suppurative otitis media of both ears without spontaneous rupture of tympanic membranes  -     azithromycin (ZITHROMAX) 250 MG tablet; Take 1 tablet by mouth See Admin Instructions for 5 days 500mg  on day 1 followed by 250mg  on days 2 - 5, Disp-6 tablet, R-0Normal    3. Severe obesity (BMI 35.0-39.9) with comorbidity (HCC)-I have reviewed/discussed the above normal BMI with the patient.  I have recommended the following interventions: dietary management education, guidance, and counseling . .      4. History of DVT (deep vein thrombosis)--work up revealed elevated hgb; jak2 negative--splenomegaly and decreased platelets. THE FINAL DECISION PER HEMATOLOGY WAS THAT PATIENT SHOULD STAY ON XARELTO.  Assessment & Plan:   Well-controlled, continue current medications--HE IS ON XARELO  5. Hx of CABG  Assessment & Plan:   Monitored by specialist- no acute findings meriting change in the plan    6. Tubulovillous adenoma of colon--RECENT COLON SHOWED THIS FINDING.--REPEAT 2 YEARS.        Return in about 3 months (around 04/11/2021).        Subjective   SUBJECTIVE/OBJECTIVE:  Sinus Problem  This is a new problem. The current episode started 1 to 4 weeks ago. The problem is unchanged. There has been no fever. Pertinent negatives include no headaches or shortness of breath.   Hypertension  This is a chronic problem. The current episode started more than 1 year ago. The problem has been waxing and waning since onset. The problem is  resistant. Pertinent negatives include no anxiety, blurred vision, chest pain, headaches, peripheral edema, PND, shortness of breath or sweats.   Hyperlipidemia  This is a chronic problem. The current episode started more than 1 year ago. The problem is resistant. Pertinent negatives include no chest pain or shortness of breath.     Review of Systems   Eyes:  Negative for blurred vision.   Respiratory:  Negative for shortness of breath.    Cardiovascular:  Negative for chest pain and PND.   Neurological:  Negative for headaches.        Objective   Physical Exam              An electronic signature was used to authenticate this note.    -- , MD

## 2021-01-11 NOTE — Assessment & Plan Note (Signed)
Well-controlled, continue current medications--HE IS ON Milinda Hirschfeld

## 2021-01-11 NOTE — Assessment & Plan Note (Signed)
Monitored by specialist- no acute findings meriting change in the plan

## 2021-01-21 ENCOUNTER — Encounter

## 2021-01-26 ENCOUNTER — Encounter

## 2021-01-31 ENCOUNTER — Inpatient Hospital Stay: Admit: 2021-01-31 | Payer: MEDICARE | Primary: Family Medicine

## 2021-01-31 ENCOUNTER — Encounter

## 2021-01-31 DIAGNOSIS — M545 Low back pain, unspecified: Secondary | ICD-10-CM

## 2021-02-14 ENCOUNTER — Encounter: Payer: MEDICARE | Attending: Neurological Surgery | Primary: Family Medicine

## 2021-02-21 ENCOUNTER — Encounter

## 2021-02-27 ENCOUNTER — Inpatient Hospital Stay: Admit: 2021-02-27 | Payer: MEDICARE | Primary: Family Medicine

## 2021-02-27 ENCOUNTER — Ambulatory Visit: Admit: 2021-02-27 | Discharge: 2021-02-27 | Payer: MEDICARE | Attending: Internal Medicine | Primary: Family Medicine

## 2021-02-27 ENCOUNTER — Encounter

## 2021-02-27 DIAGNOSIS — Z86718 Personal history of other venous thrombosis and embolism: Secondary | ICD-10-CM

## 2021-02-27 DIAGNOSIS — D696 Thrombocytopenia, unspecified: Secondary | ICD-10-CM

## 2021-02-27 LAB — CBC WITH AUTO DIFFERENTIAL
Absolute Eos #: 0.8 10*3/uL (ref 0.0–0.8)
Absolute Immature Granulocyte: 0 10*3/uL (ref 0.0–0.5)
Absolute Lymph #: 1.3 10*3/uL (ref 0.5–4.6)
Absolute Mono #: 0.5 10*3/uL (ref 0.1–1.3)
Basophils Absolute: 0.1 10*3/uL (ref 0.0–0.2)
Basophils: 1 % (ref 0.0–2.0)
Eosinophils %: 12 % — ABNORMAL HIGH (ref 0.5–7.8)
Hematocrit: 44.8 %
Hemoglobin: 14.8 g/dL (ref 13.6–17.2)
Immature Granulocytes: 1 % (ref 0.0–5.0)
Lymphocytes: 21 % (ref 13–44)
MCH: 27.9 PG (ref 26.1–32.9)
MCHC: 33 g/dL (ref 31.4–35.0)
MCV: 84.4 FL (ref 82.0–102.0)
MPV: 12.2 FL (ref 9.4–12.3)
Monocytes: 7 % (ref 4.0–12.0)
Platelets: 54 10*3/uL — ABNORMAL LOW (ref 150–450)
RBC: 5.31 M/uL (ref 4.23–5.6)
RDW: 16.6 % — ABNORMAL HIGH (ref 11.9–14.6)
Seg Neutrophils: 58 % (ref 43–78)
Segs Absolute: 3.7 10*3/uL (ref 1.7–8.2)
WBC: 6.4 10*3/uL (ref 4.3–11.1)
nRBC: 0 10*3/uL (ref 0.0–0.2)

## 2021-02-27 LAB — COMPREHENSIVE METABOLIC PANEL
ALT: 40 U/L (ref 12–65)
AST: 32 U/L (ref 15–37)
Albumin/Globulin Ratio: 1.1 (ref 0.4–1.6)
Albumin: 3.7 g/dL (ref 3.2–4.6)
Alk Phosphatase: 58 U/L (ref 50–136)
Anion Gap: 6 mmol/L (ref 2–11)
BUN: 22 MG/DL (ref 8–23)
CO2: 29 mmol/L (ref 21–32)
Calcium: 9.3 MG/DL (ref 8.3–10.4)
Chloride: 103 mmol/L (ref 101–110)
Creatinine: 1.4 MG/DL (ref 0.8–1.5)
Est, Glom Filt Rate: 56 mL/min/{1.73_m2} — ABNORMAL LOW (ref >60)
Globulin: 3.4 g/dL (ref 2.8–4.5)
Glucose: 142 mg/dL — ABNORMAL HIGH (ref 65–100)
Potassium: 5 mmol/L (ref 3.5–5.1)
Sodium: 138 mmol/L (ref 133–143)
Total Bilirubin: 0.9 MG/DL (ref 0.2–1.1)
Total Protein: 7.1 g/dL (ref 6.3–8.2)

## 2021-02-27 NOTE — Patient Instructions (Addendum)
Patient Instructions from Today's Visit    Reason for Visit:  Follow up - DVT    Diagnosis Information:  https://www.cancer.net/about-us/asco-answers-patient-education-materials/asco-answers-fact-sheets    Plan:  Labs reviewed - platelet count declined.   You have stated that you donated blood yesterday - this could be the cause of the decreased platelet count.  We will need to recheck your labs in one week to ensure that your platelet count has improved for you to continue the Xarelto.  We would need to discontinue the Xarelto if your platelet count drops below 50,000.  If you want to continue to donate blood, we would recommend that you you increase the interval between donations - The recommendation would be that you wait 3-4 months between donations.  Continue Xarelto (47m) - you are on the lower prophylactic dose.   Bilateral ultrasound of legs did not show any blood clots in the legs.  Please call [place] you have any questions or concerns before your next visit.    Follow Up:  Lab only appointment in 1 week.   Follow up in 6 months with Dr. ADeatra Canter with labs prior.     Recent Lab Results:  Hospital Outpatient Visit on 02/27/2021   Component Date Value Ref Range Status    WBC 02/27/2021 6.4  4.3 - 11.1 K/uL Final    RESULTS CHECKED X 2    RBC 02/27/2021 5.31  4.23 - 5.6 M/uL Final    Hemoglobin 02/27/2021 14.8  13.6 - 17.2 g/dL Final    Hematocrit 02/27/2021 44.8  % Final    MCV 02/27/2021 84.4  82.0 - 102.0 FL Final    MCH 02/27/2021 27.9  26.1 - 32.9 PG Final    MCHC 02/27/2021 33.0  31.4 - 35.0 g/dL Final    RDW 02/27/2021 16.6 (A)  11.9 - 14.6 % Final    Platelets 02/27/2021 54 (A)  150 - 450 K/uL Final    MPV 02/27/2021 12.2  9.4 - 12.3 FL Final    nRBC 02/27/2021 0.00  0.0 - 0.2 K/uL Final    **Note: Absolute NRBC parameter is now reported with Hemogram**    Differential Type 02/27/2021 AUTOMATED    Final    Seg Neutrophils 02/27/2021 58  43 - 78 % Final    Lymphocytes 02/27/2021 21  13 - 44 % Final     Monocytes 02/27/2021 7  4.0 - 12.0 % Final    Eosinophils % 02/27/2021 12 (A)  0.5 - 7.8 % Final    Basophils 02/27/2021 1  0.0 - 2.0 % Final    Immature Granulocytes 02/27/2021 1  0.0 - 5.0 % Final    Segs Absolute 02/27/2021 3.7  1.7 - 8.2 K/UL Final    Absolute Lymph # 02/27/2021 1.3  0.5 - 4.6 K/UL Final    Absolute Mono # 02/27/2021 0.5  0.1 - 1.3 K/UL Final    Absolute Eos # 02/27/2021 0.8  0.0 - 0.8 K/UL Final    Basophils Absolute 02/27/2021 0.1  0.0 - 0.2 K/UL Final    Absolute Immature Granulocyte 02/27/2021 0.0  0.0 - 0.5 K/UL Final    Sodium 02/27/2021 138  133 - 143 mmol/L Final    Potassium 02/27/2021 5.0  3.5 - 5.1 mmol/L Final    Chloride 02/27/2021 103  101 - 110 mmol/L Final    CO2 02/27/2021 29  21 - 32 mmol/L Final    Anion Gap 02/27/2021 6  2 - 11 mmol/L Final    Glucose  02/27/2021 142 (A)  65 - 100 mg/dL Final    BUN 02/27/2021 22  8 - 23 MG/DL Final    Creatinine 02/27/2021 1.40  0.8 - 1.5 MG/DL Final    Est, Glom Filt Rate 02/27/2021 56 (A)  >60 ml/min/1.57m Final    Comment:      Pediatric calculator link: https://www.kidney.org/professionals/kdoqi/gfr_calculatorped       These results are not intended for use in patients <141years of age.       eGFR results are calculated without a race factor using  the 2021 CKD-EPI equation. Careful clinical correlation is recommended, particularly when comparing to results calculated using previous equations.  The CKD-EPI equation is less accurate in patients with extremes of muscle mass, extra-renal metabolism of creatinine, excessive creatine ingestion, or following therapy that affects renal tubular secretion.      Calcium 02/27/2021 9.3  8.3 - 10.4 MG/DL Final    Total Bilirubin 02/27/2021 0.9  0.2 - 1.1 MG/DL Final    ALT 02/27/2021 40  12 - 65 U/L Final    AST 02/27/2021 32  15 - 37 U/L Final    Alk Phosphatase 02/27/2021 58  50 - 136 U/L Final    Total Protein 02/27/2021 7.1  6.3 - 8.2 g/dL Final    Albumin 02/27/2021 3.7  3.2 - 4.6 g/dL Final     Globulin 02/27/2021 3.4  2.8 - 4.5 g/dL Final    Albumin/Globulin Ratio 02/27/2021 1.1  0.4 - 1.6   Final         Treatment Summary has been discussed and given to patient: N/A        -------------------------------------------------------------------------------------------------------------------  Please call our office at [phone] you have any  of the following symptoms:   Fever of 100.5 or greater  Chills  Shortness of breath  Swelling or pain in one leg    After office hours an answering service is available and will contact a provider for emergencies or if you are experiencing any of the above symptoms.    Patient does express an interest in My Chart.  My Chart log in information explained on the after visit summary printout at the [email or web address].    LWelford Roche RN

## 2021-02-27 NOTE — Progress Notes (Signed)
[facility]: Office Visit Established Patient    Reason for follow up:    H/o DVT  thrombocytopenia      Oncologic overview: per [place] visit dated 07/07/19:    Secondary erythrocytosis  ?? Hx of hemoglobin as high as 19  ?? JAK2 negative; splenomegaly - mild  ?? Bone marrow consistent with secondary erythrocytosis 7/16; JAK2, CALr, MPL on marrow negative    Splenomegaly with thrombocytopenia  ?? Negative bone marrow 8/16  ?? Hepatitis panel pending 12/16    DVT RLE 7/17  ?? There was not enough blood to do the complete antiphospholipid testing. The lupus anticoagulant was positive which may be artifactual related to the presence of xarelto. Both factor V Leiden and the prothrombin gene mutation were absent        DIAGNOSIS     "BONE MARROW CORE BIOPSY, CLOT SECTION, ASPIRATE AND PERIPHERAL BLOOD   SMEAR:   - MILDLY HYPERCELLULAR MARROW WITH ERYTHROID HYPERPLASIA; SEE NOTE   - NO SIGNIFICANT MARROW FIBROSIS IDENTIFIED ON RETICULIN STAIN     NOTE: LABORATORY STUDIES ARE NOTABLE FOR AN ELEVATED ERYTHROPOIETIN   (28.2 MIU/ML, REF 2.6-18.5), AND NEGATIVE FOR MUTATIONS OF JAK2 V617F   ([facility]), JAK2 EXONS 12-14, CALRETICULIN, AND MPL. OVERALL, THE   FEATURES ARE MOST IN KEEPING WITH SECONDARY ERYTHROCYTOSIS.     THE CASE WAS DISCUSSED WITH Chad Rosario AT 9:56 AM, 11/29/2014. 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     In June 2022, patient relocated to [place] from [place] to be closer to her children.  He has establish care with PCP and cardiology already.  He screening colonoscopy was  completed by Chad Rosario on 11/29/2020 with history of colon polyps.     Patient's presents today to discuss management of chronic anticoagulation.  He has been on anticoagulation for right lower extremity DVT diagnosed in July 2017.  He continues to take Xarelto 10 mg daily.  Based on history of coronary artery disease, he also takes aspirin 81 mg oral daily.     On evaluation today, he denies lower extremity pain or persistent swelling.  Right leg swells after a long drive.  He denies chest pain, unusual shortness of breath, nausea, vomiting, gum bleeds/nosebleeds/bloody stool/hematuria, abdominal pain, diarrhea or dysuria.  Denies fevers, drenching night sweats, early satiety, unintentional weight loss.  No falls, head trauma.     C/o mild macular rash on the dorsal aspect of left foot.  Also has chronic neuropathy involving bilateral feet-unchanged"      Interval history:    Feels well overall  Donated blood yesterday.  No new symptoms  Has gained weight  Denies unusual bleeding or bruising      Review of Systems:  14 point ROS was negative except as per HPI      ECOG PERFORMANCE STATUS - 0-Fully active, able to carry on all pre-disease performance without restriction.    Pain - /10. None/Minimal pain - not affecting QOL     Fatigue - No flowsheet data found.  Distress - No flowsheet data found.  Reviewed and updated this visit by provider:          Current Outpatient Medications   Medication Sig Dispense Refill    Acetaminophen 500 MG CAPS Take 1,000 mg by mouth as needed      allopurinol (ZYLOPRIM) 300 MG tablet TAKE 1 TABLET BY MOUTH  DAILY      Alpha-Lipoic Acid 200 MG CAPS Take 300 mg by mouth      amitriptyline (ELAVIL) 10 MG tablet Take 10 mg nightly      aspirin 81 MG EC tablet Take 81 mg by mouth in the morning.      azelastine (ASTELIN) 0.1 % nasal spray 1 spray by Nasal route as needed      colchicine (MITIGARE) 0.6 MG capsule Take 0.6 mg by mouth as needed      fexofenadine (ALLEGRA) 180 MG tablet  Take 180 mg by mouth in the morning.      gabapentin (NEURONTIN) 300 MG capsule Take 300 capsules by mouth in the morning, at noon, and at bedtime. Take 2 capsules 3 times a day      lisinopril (PRINIVIL;ZESTRIL) 20 MG tablet Take by mouth daily      rivaroxaban (XARELTO) 10 MG TABS tablet Take 10 mg by mouth in the morning.      zolpidem (AMBIEN) 10 MG tablet TAKE 1 TABLET BY MOUTH  DAILY AS NEEDED       No current facility-administered medications for this visit.        OBJECTIVE:  BP (!) 148/79    Pulse 71    Temp 98.3 ??F (36.8 ??C) (Oral)    Resp 16    Ht '6\' 1"'  (1.854 m)    Wt 288 lb 14.4 oz (131 kg)    SpO2 97%    BMI 38.12 kg/m??   Wt Readings from Last 3 Encounters:   02/27/21 288 lb 14.4 oz (131 kg)   01/09/21 282 lb (127.9 kg)   12/18/20 285 lb (129.3 kg)       Physical Exam:  Patient alert and oriented x 3, in no acute distress  Integumentary: No Pallor, no icterus  HEENT: moist mucous membranes, normal oropharynx  Lymph nodes: no cervical or axillary LAD  Cardiovascular:RRR, S1, S2 present, no m/r/g   Lungs: Clear to auscultation, no rales or wheezing, no accessory muscle use  Abdomen: Soft, and non-tender on palpation, no organomegaly, bowel sounds audible  Extremities: No peripheral edema, no joint swelling  Neurological: patient can follow commands and move all extremities    Labs:  Lab Results   Component Value Date    WBC 6.4 02/27/2021    HGB 14.8 02/27/2021    HCT 44.8 02/27/2021    MCV 84.4 02/27/2021    PLT 54 (L) 02/27/2021     Lab Results   Component Value Date    LYMPHOPCT 21 02/27/2021    LYMPHSABS 1.3 02/27/2021    MONOPCT 7 02/27/2021    MONOSABS 0.5 02/27/2021    EOSABS 0.8 02/27/2021    BASOPCT 1 02/27/2021     Lab Results   Component Value Date    NA 138 02/27/2021    K 5.0 02/27/2021    CL 103 02/27/2021    CO2 29 02/27/2021    BUN 22 02/27/2021    CREATININE 1.40 02/27/2021    GLUCOSE 142 (H) 02/27/2021    CALCIUM 9.3 02/27/2021    PROT 7.1 02/27/2021    LABALBU 3.7 02/27/2021    BILITOT  0.9 02/27/2021    ALKPHOS 58 02/27/2021    AST 32 02/27/2021    ALT 40 02/27/2021    LABGLOM 56 (L) 02/27/2021    GFRAA >60 12/18/2020    GLOB 3.4 02/27/2021     Date Obtained:   11/29/2020   DIAGNOSIS        A:  "ASCENDING COLON POLYPS":  FRAGMENTS OF MIXED TUBULOVILLOUS   ADENOMA.        B:  "TRANSVERSE COLON POLYP":  FRAGMENTS OF MIXED TUBULOVILLOUS   ADENOMA.           sms/12/01/2020     Sign Out Date: 12/01/2020  J. Charmaine Downs., M.D.             Imaging:  XR THORACIC SPINE (3 VIEWS)    Result Date: 01/31/2021  1. Extensive postsurgical change without definite acute abnormality. 2. Fractured sternal wires seen within the upper sternum.    XR LUMBAR SPINE (MIN 4 VIEWS)    Result Date: 01/31/2021  1. Extensive postsurgical change without definite acute abnormality. 2. Fractured sternal wires seen within the upper sternum.    12/25/20: IMPRESSION:  No evidence of deep venous thrombosis in either lower extremity      Problems:  1. History of DVT (deep vein thrombosis), right lower extremity   -Long-term anticoagulation with Xarelto  -Chronic mild thrombocytopenia, without evidence of excessive bleeding-this has been worked up in the past.  He has had a bone marrow biopsy in 2017.  Historically, platelet counts have remained relatively stable and above 100.  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  -Seasonal allergies  -Hypertension  -Sleep disturbance    Drop in platelets below baseline on today's labs likely 2/2 donating blood.     PLAN:  Repeat CBC in one week.  Follow labs.   Repeat PSA.   Medications reviewed  There is no current indication for bone marrow biopsy and we will likely proceed with observation at this time.  No intervention is needed as long as counts remain over 30,000. Reviewed indications to seek emergency medical care. Hold xarelto if plt count drops below 50,000 or abnormal bleeding occurs.    He will continue taking Xarelto 10 mg PO daily for h/o  unprovoked RLE DVT. Needs close monitoring for bleeding complications in the setting of thrombocytopenia.    He will continue taking lisinopril, Allegra, and Ambien (as needed).      ROV with labs as scheduled.   All questions were answered to the best of my abilities.          Howie Ill, MD  [facility] [place] [address]  Office : [phone]  Fax : [phone]    Elements of this note have been dictated using speech recognition software. As a result, errors of speech recognition may have occurred.

## 2021-03-05 ENCOUNTER — Encounter: Admit: 2021-03-05 | Discharge: 2021-03-05 | Payer: MEDICARE | Attending: Neurological Surgery | Primary: Family Medicine

## 2021-03-05 DIAGNOSIS — Z981 Arthrodesis status: Secondary | ICD-10-CM

## 2021-03-05 NOTE — Progress Notes (Signed)
[facility] CLINIC NOTE:   History of Present Illness:    CC: Establish care regarding a history of multiple lumbar spine surgeries    Chad Rosario is a 65 y.o. male with a past medical history significant for hypertension, idiopathic peripheral neuropathy and anticoagulation with Xarelto who here today to establish care regarding a complex medical history regarding his lumbar spine.  The patient has undergone multiple lumbar spine surgeries including one in 2003, 2015 and most recently in December 2021.  His last surgery he underwent a T10 to pelvis deformity Correction Surgery Performed Doctor Belva Bertin and [facility] at [facility].  He has also undergone a previous spinal cord stimulator trial placement and removal as it did not help him.  The patient and his wife most recently moved to the [place] area of [place] within the last 6 months to be closer to his 2 daughters and his grandchildren.  He states that following surgery he had worsening neuropathy in his lower extremities and was monitored by his orthopedic spine surgeon and recommended a full year of healing.  He also had a subcutaneous fluid collection that he felt in and around the incision that has dissipated and significantly decreased on following his 2 surgeries last year.  He denies any fevers chills or drainage from that wound as it is completely healed.  He states that he denies any positional headaches.  He states that his pain is significantly improved in comparison to his preoperative baseline.  He currently rates his pain as a 1 out of 10 on a visual analog pain scale mostly states that he is stiff more than anything he did not do any physical therapy following his last surgery.  He has been guarded protecting his low back.  The patient has x-ray AP and lateral imaging of the thoracolumbar spine that demonstrates a T10 to pelvis  pedicle screw rod fixation and instrumented fusion with interbody devices present at L2-L3 L3-L4 L4-L5 and an anterior interbody fixation and fusion at L5-S1.  There is evidence of a missing pedicle screw at L3 on the right there is some lucency surrounding the T10 screw on the right but the construct appears stable.      Past Medical History:   Diagnosis Date    Hypertension     controlled with meds    Sleep apnea     cpap at night     Past Surgical History:   Procedure Laterality Date    BACK SURGERY  2021    has had 3 different ones last was in 2021    CARDIAC SURGERY      CABG    CHOLECYSTECTOMY      COLONOSCOPY N/A 11/29/2020    COLONOSCOPY POLYPECTOMY HOT SNARE performed by Drucie Opitz, MD at [facility] ENDOSCOPY    EYE SURGERY Left     cataract    FRACTURE SURGERY Left     tib-fib fracture    TOTAL HIP ARTHROPLASTY Left      Allergies   Allergen Reactions    Hydrocodone-Acetaminophen Anxiety and Itching    Losartan Shortness Of Breath    Amlodipine Other (See Comments)     Other reaction(s): Other (See Comments)  Fatigue   Fatigue       Rosuvastatin Other (See Comments)     Other reaction(s): Constipation  Fatigue, constipation  Other reaction(s): Fatigue    Atorvastatin Diarrhea  Ezetimibe      Other reaction(s): Constipation, Other (See Comments)  Constipation and decreased urine output  Constipation and decreased urine output  Other reaction(s): Constipation, Urine output low        No family history on file.   Social History     Socioeconomic History    Marital status: Married     Spouse name: Not on file    Number of children: Not on file    Years of education: Not on file    Highest education level: Not on file   Occupational History    Not on file   Tobacco Use    Smoking status: Never    Smokeless tobacco: Never   Vaping Use    Vaping Use: Never used   Substance and Sexual Activity    Alcohol use: Yes     Comment: socially    Drug use: Not on file    Sexual activity: Not on file   Other Topics Concern    Not  on file   Social History Narrative    Not on file     Social Determinants of Health     Financial Resource Strain: Not on file   Food Insecurity: Not on file   Transportation Needs: Not on file   Physical Activity: Not on file   Stress: Not on file   Social Connections: Not on file   Intimate Partner Violence: Not on file   Housing Stability: Not on file     Current Outpatient Medications   Medication Sig Dispense Refill    Acetaminophen 500 MG CAPS Take 1,000 mg by mouth as needed      allopurinol (ZYLOPRIM) 300 MG tablet TAKE 1 TABLET BY MOUTH  DAILY      Alpha-Lipoic Acid 200 MG CAPS Take 300 mg by mouth      amitriptyline (ELAVIL) 10 MG tablet Take 10 mg nightly      aspirin 81 MG EC tablet Take 81 mg by mouth in the morning.      azelastine (ASTELIN) 0.1 % nasal spray 1 spray by Nasal route as needed      colchicine (MITIGARE) 0.6 MG capsule Take 0.6 mg by mouth as needed      fexofenadine (ALLEGRA) 180 MG tablet Take 180 mg by mouth in the morning.      gabapentin (NEURONTIN) 300 MG capsule Take 300 capsules by mouth in the morning, at noon, and at bedtime. Take 2 capsules 3 times a day      lisinopril (PRINIVIL;ZESTRIL) 20 MG tablet Take by mouth daily      rivaroxaban (XARELTO) 10 MG TABS tablet Take 10 mg by mouth in the morning.      zolpidem (AMBIEN) 10 MG tablet TAKE 1 TABLET BY MOUTH  DAILY AS NEEDED       No current facility-administered medications for this visit.     Patient Active Problem List   Diagnosis    Obesity (BMI 30-39.9)    History of DVT (deep vein thrombosis)    Hx of CABG    Hypertension    Hyperlipidemia    Chronic dyspnea    Thrombocytopenia, unspecified        Review of Systems  Negative except as noted for a small fluctuance underlying his incision that has been there and decreased in size since prior to the surgery, numbness and tingling in the bilateral lower extremities for which she currently takes gabapentin, and low back pain and stiffness  that he rates as 1 out of  10    Physical Exam:  BP (!) 163/79    Pulse 65    Temp 97.7 ??F (36.5 ??C) (Temporal)    Ht 6\' 1"  (1.854 m)    Wt 288 lb (130.6 kg)    SpO2 97%    BMI 38.00 kg/m??   Pain Score:   1  Head normocephalic and atraumatic  Mood and affect appropriate  General: No acute distress  Resp: No increased work of breathing  Skin: warm and dry  Awake, alert, and oriented   Speech Fluent  Eyes open spontaneously   No mid-line cervical, thoracic, or lumbar tenderness to palpation   Patient with strength exam as follows:   Upper Extremities: Right Left      Deltoid  5 5    Biceps  5 5    Triceps  5 5    Grip  5 5     Lower Extremities:      Hip Flex 5 5    Quads  5 5    Hamstrings 5 5    Dorsiflex 5 5    Plantarflex 5 5    EHL  5 5  Sensation decreased to light touch in bilateral lower extremities  Well-healed midline lumbar incision with some mild and minimal fluctuance underlying the incision that moves with palpation of the incision.  It is nontender there is no erythema there is no evidence of drainage.    Gait: normal nonantalgic gait, stands from a seated position without difficulty and ambulates independently    Assessment & Plan:  Traquan Duarte is a 65 y.o. male with a past medical history significant for hypertension, idiopathic peripheral neuropathy and anticoagulation with Xarelto who here today to establish care regarding a complex medical history regarding his lumbar spine.   I have independently reviewed and interpreted the patient's x-ray AP and lateral imaging of the thoracolumbar spine that demonstrates a T10 to pelvis pedicle screw rod fixation and instrumented fusion with interbody devices present at L2-L3 L3-L4 L4-L5 and an anterior interbody fixation and fusion at L5-S1.  There is evidence of a missing pedicle screw at L3 on the right there is some lucency surrounding the T10 screw on the right but the construct appears stable.  At this time the patient has some evidence of some lucency around the right T10  pedicle screw but no definitive evidence of proximal junctional kyphosis.  I discussed with the patient that in the absence of significant pain or discomfort that I would not recommend any further thoracic or lumbar revision spinal surgery.  In the absence of active infection or wound problem I would not recommend investigating or treating that small area of fluctuance which may be a combination of a small amount of fluid versus atrophy of the subcutaneous tissues given the fact that he has had multiple thoracolumbar surgeries.  At this time the patient has not done any postoperative physical therapy.  I will therefore refer him to physical therapy for his lumbar spine to help with his stiffness and core strengthening.  I will refer him for 2 times a week for 8 weeks time.  I discussed that should he develop any worsening pain or discomfort that would be happy to see him back to investigate his instrumented construct and fusion further.  Patient understands and is happy to call [phone] back and reschedule should he have any further issues.      ICD-10-CM  1. History of thoracic spinal fusion  Z98.1       2. History of lumbar fusion  Z98.1       3. History of lumbar discectomy  Z98.890           Ty Oshima R. Tania Ade, MD  Neurosurgeon  [facility]  [facility], [place]     Notes are transcribed with [employer] Milan, a medical voice recording dictation service, and may contain minor errors.

## 2021-03-06 NOTE — Addendum Note (Signed)
Addended by: Rosina Lowenstein on: 03/06/2021 09:09 AM     Modules accepted: Orders

## 2021-03-08 ENCOUNTER — Other Ambulatory Visit: Payer: MEDICARE | Primary: Family Medicine

## 2021-03-08 ENCOUNTER — Encounter

## 2021-03-08 DIAGNOSIS — D696 Thrombocytopenia, unspecified: Secondary | ICD-10-CM

## 2021-03-08 LAB — PSA, DIAGNOSTIC: PSA: 0.9 ng/mL (ref <4.0)

## 2021-03-08 LAB — CBC WITH AUTO DIFFERENTIAL
Absolute Eos #: 0.8 10*3/uL (ref 0.0–0.8)
Absolute Immature Granulocyte: 0 10*3/uL (ref 0.0–0.5)
Absolute Lymph #: 1.5 10*3/uL (ref 0.5–4.6)
Absolute Mono #: 0.6 10*3/uL (ref 0.1–1.3)
Basophils Absolute: 0.1 10*3/uL (ref 0.0–0.2)
Basophils: 1 % (ref 0.0–2.0)
Eosinophils %: 11 % — ABNORMAL HIGH (ref 0.5–7.8)
Hematocrit: 44.3 %
Hemoglobin: 14.7 g/dL (ref 13.6–17.2)
Immature Granulocytes: 0 % (ref 0.0–5.0)
Lymphocytes: 21 % (ref 13–44)
MCH: 27.9 PG (ref 26.1–32.9)
MCHC: 33.2 g/dL (ref 31.4–35.0)
MCV: 84.2 FL (ref 82.0–102.0)
MPV: 12.1 FL (ref 9.4–12.3)
Monocytes: 8 % (ref 4.0–12.0)
Platelets: 60 10*3/uL — ABNORMAL LOW (ref 150–450)
RBC: 5.26 M/uL (ref 4.23–5.6)
RDW: 17 % — ABNORMAL HIGH (ref 11.9–14.6)
Seg Neutrophils: 59 % (ref 43–78)
Segs Absolute: 4.2 10*3/uL (ref 1.7–8.2)
WBC: 7.2 10*3/uL (ref 4.3–11.1)
nRBC: 0 10*3/uL (ref 0.0–0.2)

## 2021-03-08 NOTE — Telephone Encounter (Signed)
Pt notified of plt count and instructed to discontinue blood thinner should he develop any bleeding.  Pt verbalized understanding of instruction.  Lab only apt scheduled for 1/12 at 8AM to recheck CBC - pt aware of apt date/time.

## 2021-03-08 NOTE — Telephone Encounter (Signed)
-----   Message from Hector Shade, MD sent at 03/08/2021  8:45 AM EST -----  Platelet count 60.Repeat cbc in 4 weeks. Pt should stop xarelto if he develops any bleeding.

## 2021-03-14 ENCOUNTER — Encounter: Payer: MEDICARE | Primary: Family Medicine

## 2021-03-14 DIAGNOSIS — Z981 Arthrodesis status: Secondary | ICD-10-CM

## 2021-03-14 NOTE — Other (Signed)
Chad Rosario  DOB: Dec 02, 1955  Primary: Medicare Part A And B (Medicare)  Secondary: [facility] MEDICARE SUPP [facility]  [address]  [address]  [place] [address]  Phone: [phone]  Fax: [phone] Plan Frequency: 2 times a week       PT Visit Info:    Plan Frequency: 2 times a week  Total # of Visits to Date: 1    Visit Count:  1                OUTPATIENT PHYSICAL THERAPY:             OP NOTE TYPE: Initial Assessment 03/14/2021               Episode  Appt Desk         Treatment Diagnosis:  Low Back Pain (M54.5)  Abnormal posture (R29.3)  Medical/Referring Diagnosis:  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]  History of lumbar fusion [Z98.1]  History of lumbar discectomy [G86.761]  Referring Physician:  Reuel Boom, MD  MD Orders:  PT Eval and Treat   Return MD Appt:  NA  Date of Onset:  Onset Date: 03/24/20    Allergies:  Hydrocodone-acetaminophen, Losartan, Amlodipine, Rosuvastatin, Atorvastatin, and Ezetimibe  Restrictions/Precautions:    Restrictions/Precautions: None  Required Braces or Orthoses?: No      Medications Last Reviewed:  03/14/2021     SUBJECTIVE   History of Injury/Illness (Reason for Referral):     Patient has had a complicated history of multiple lumbar spine surgeries including one in 2003, 2015, and most recently in December 2021.  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  Patient reports having significant pain in between his shoulder blades as well as below them.  He reports that the pain doesn't neccesarily affect his sleep however he does feel increased stiffness and pain whenever he transferring from a static position that he held for a prolonged period of time.      Initial:     4/10 Post Session:     4/10  Past Medical History/Comorbidities:   Mr. Toothman  has a past medical history of Hypertension and Sleep apnea.  Mr. Sexson  has a  past surgical history that includes Cholecystectomy; Cardiac surgery; Total hip arthroplasty (Left); eye surgery (Left); fracture surgery (Left); back surgery (2021); and Colonoscopy (N/A, 11/29/2020).  Social History/Living Environment:   Lives With: Spouse  Type of Home: Condo  Home Layout: Two level     Prior Level of Function/Work/Activity:   Prior level of function: Independent  Prior level of function: Independent  Current level of function: Independent  Occupation: Retired           Producer, television/film/video:   Does the patient/guardian have any barriers to learning?: No barriers  Will there be a co-learner?: No  What is the preferred language of the patient/guardian?: English  Is an interpreter required?: No  How does the patient/guardian prefer to learn new concepts?: Listening; Reading; Demonstration; Pictures/Videos     Fall Risk Scale:   Morse Total Score: 0  Morse Fall Risk: Low (0-24)     Dominant Side:  right handed      OBJECTIVE   Observation/Orthostatic Postural Assessment:    Demonstrates increased rounded shoulders w/ forward head posture.  Increased kyphosis in mid-thoracic spine as well as significant tone present in scapular musculature.  Palpation:    Significant tenderness to palpation bilateral rhomboids, thoracic/lumbar paraspinals and throughout the medial border of the scapulae.   Gait:   Demonstrates a decreased stride length with wide BOS as well as no heel strike and flexed trunk posture.    Special tests:  - SLR test      Joint/Muscle ROM Strength Updates   Lumbar flexion Limited with significant pain      Lumbar extension Limited with significant pain      Hip flexion 100 degrees on RLE, 105 degrees on LLE 4/5 BLE      Knee extension WNL  4/5 BLE      Knee flexion WNL 4/5 BLE      Hip abduction Not assessed today      Hip extension Not assessed today.                    Lumbar Quadrant Test   Movement     Forward flexion/right side bend Increased pain on L side, compressive sensation    Forward  flexion/left side bend Pain w/ slight stretch    Extension/right side bend Unable due to pain    Extension/left side bend Unable due to pain       ASSESSMENT   Initial Assessment:  Mr. Rhine presents to PT with c/o of chronic mid-thoracic/lumbar pain that has been persistent since his last fusion surgery performed on December 31st 2021.    Pt demonstrated significant tenderness and tightness to palpation to the bilateral rhomboids, thoracic/lumbar paraspinals and throughout the medial border of the scapulae.   With testing, pt displayed decreased core strength, overall mobility, and poor scapular activation due to the increased tone seen in his mid-thoracic spine. Additionally, he scored a 14/50 on the modified Oswestry. Balraj Brayfield will benefit from continued skilled PT services to improve deficits listed below and to progress towards his PLOF.     Problem List: (Impacting functional limitations):    Body Structures, Functions, Activity Limitations Requiring Skilled Therapeutic Intervention: Decreased functional mobility ; Decreased ADL status; Decreased ROM; Decreased body mechanics; Decreased tolerance to work activity; Decreased strength; Decreased endurance; Decreased posture; Increased pain     Therapy Prognosis:   Therapy Prognosis: Good     Initial Assessment Complexity:   Decision Making: Medium Complexity    PLAN   Effective Dates: 03/14/21 TO  06/12/21    Frequency/Duration: Plan Frequency: 2 times a week   Interventions Planned (Treatment may consist of any combination of the following):    Current Treatment Recommendations: Strengthening; ROM; Balance training; Functional mobility training; Transfer training; Gait training; Stair training; Neuromuscular re-education; Manual; Modalities; Home exercise program     GOALS: (Goals have been discussed and agreed upon with patient.)  Short Term Goals 4 weeks   Stepehn Eckard will be independent with HEP to promote self-management of symptoms.   Ashlee Bewley will participate in LE strengthening program with weights as appropriate to help with gait and elevations.  Oaklee Sunga will participate in static and dynamic balance activities to decrease the risk for falls and improve overall QOL.  Cataldo Cosgriff will tolerate manual therapy/joint mobilizations/soft tissue to increase ROM and decrease pain to improve functional mobility during ADLs.     Discharge Goals 12 weeks  Dontravious Camille will demonstrate an 10 point improvement on the Oswestry to show improvement in function.  Athony Coppa will demonstrate >=4+/5 LE strength on manual muscle testing to improve functional mobility.  Lain Tetterton will be  able to perform SLS >10 seconds bilaterally to help with gait and improve balance.  Damaree Sargent will be able to demonstrate safe lifting and transfer mechanics without cueing for improved safety with home, childcare, and community activities.          Outcome Measure:   Tool Used: Modified Oswestry Low Back Pain Questionnaire  Score:  Initial: 14/50  Most Recent: X/50 (Date: -- )   Interpretation of Score: Each section is scored on a 0-5 scale, 5 representing the greatest disability.  The scores of each section are added together for a total score of 50.      Medical Necessity:   > Patient is expected to demonstrate progress in strength, range of motion, and balance to increase independence with ADLs.  > Skilled intervention continues to be required due to impairments listed above.  Reason For Services/Other Comments:  > Patient continues to demonstrate capacity to improve rom, strength, overall mobility, and activity tolerance which will increase independence.  > Patient continues to require skilled intervention due to impairments listed above.  Total Duration:  Time In: 0930  Time Out: 1015    Regarding Yi Petrey's therapy, I certify that the treatment plan above will be carried out by a therapist or under their direction.  Thank you for this  referral,  Marcelyn Ditty, PT     Referring Physician Signature: Reuel Boom, MD _______________________________ Date _____________        Post Session Pain  Charge Capture  PT Visit Info MD Guidelines  MyChart

## 2021-03-21 ENCOUNTER — Inpatient Hospital Stay: Admit: 2021-03-21 | Payer: MEDICARE | Primary: Family Medicine

## 2021-03-21 NOTE — Progress Notes (Signed)
Chad Rosario  DOB: 1955/07/04  Primary: Medicare Part A And B (Medicare)  Secondary: [facility] MEDICARE SUPP [facility]  [address]  [address]   [place] [address]  Phone: [phone]  Fax: [phone] Plan Frequency: 2 times a week    Plan of Care/Certification Expiration Date: 06/12/21    PT Visit Info:  Plan Frequency: 2 times a week  Plan of Care/Certification Expiration Date: 06/12/21  Total # of Visits to Date: 2     Visit Count:  2   OUTPATIENT PHYSICAL THERAPY:OP NOTE TYPE: Treatment Note 03/21/2021       Episode  }Appt Desk             Treatment Diagnosis:  Low Back Pain (M54.5)  Abnormal posture (R29.3)  Medical/Referring Diagnosis:  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]  History of lumbar fusion [Z98.1]  History of lumbar discectomy [B15.176]  Referring Physician:  Reuel Boom, MD  MD Orders:  PT Eval and Treat   Date of Onset:  Onset Date: 03/24/20     Allergies:   Hydrocodone-acetaminophen, Losartan, Amlodipine, Rosuvastatin, Atorvastatin, and Ezetimibe  Restrictions/Precautions:  Restrictions/Precautions: None  Required Braces or Orthoses?: No  No data recorded     Interventions Planned (Treatment may consist of any combination of the following):    Current Treatment Recommendations: Strengthening; ROM; Balance training; Functional mobility training; Transfer training; Gait training; Stair training; Neuromuscular re-education; Manual; Modalities; Home exercise program     Subjective Comments:  patient reports that the stretches have helped, he continues to have increase stiffness when changing positions.  Initial:}    3/10Post Session:       3/10  Medications Last Reviewed:  03/21/2021  Updated Objective Findings:  None Today  Treatment   THERAPEUTIC EXERCISE: (40 minutes):    Exercises per grid below to improve mobility, strength, and balance.  Required moderate visual, verbal, manual, and tactile cues to promote proper body alignment, promote proper body posture, and  promote proper body mechanics.  Progressed resistance, range, and repetitions as indicated.     Date:  12/28 Date:   Date:     Activity/Exercise Parameters Parameters Parameters   Nustep  L2 x 10 min      Lumbar flexion stretch Seated forward/lateral 3 x 15''      Lower trunk rotation 5 ea way      Knee to chest 5 x 15'' holds BLE     Upper thoracic rotation 2 x 15'' holds      Core strengthening Ppt x 15   Ppt w/ isometric hip flexion 5 x 10 sec holds      Education Discussed importance of posture and decreasing tone      Hip flexor stretch Standing x 5        MANUAL THERAPY: (15 minutes):   Joint mobilization, Soft tissue mobilization, and Manipulation was utilized and necessary because of the patient's restricted joint motion, painful spasm, loss of articular motion, and restricted motion of soft tissue.     STM/MFR to mid-thoracic paraspinals and rhomboids in prone utilizing suction cups    Treatment/Session Summary:    Treatment Assessment:  [name] did well throughout todays session, continues to have increased tone and apprehensive about performing movements that have been limited for the past couple of years.  Communication/Consultation:  None today  Equipment provided today:  None  Recommendations/Intent for next treatment session: Next visit will focus on decreasing tone of mid-thoracic paraspinals and bilateral rhomboids.  Total Treatment Billable Duration:  40 therex minutes, 15 manual therapy mintues  Time In: 1300  Time Out: 1355    Marcelyn Ditty, PT       Charge Capture  }Post Session Pain  PT Visit Info  MedBridge Portal  MD Guidelines  Scanned Media  Benefits  MyChart    Future Appointments   Date Time Provider Department Center   03/23/2021  8:45 AM Marcelyn Ditty, PT [facility]   03/27/2021  8:45 AM Marcelyn Ditty, PT [facility]   03/29/2021  8:45 AM Marcelyn Ditty, PT [facility]   04/03/2021  8:30 AM Marcelyn Ditty, PT [facility]   04/05/2021  8:10 AM [facility]    04/05/2021  2:30 PM Marcelyn Ditty, PT [facility]   04/10/2021  8:45 AM Marcelyn Ditty, PT [facility]   04/12/2021  2:30 PM Marcelyn Ditty, PT [facility]   04/13/2021  8:00 AM [name], MD [facility]   04/17/2021  8:45 AM Marcelyn Ditty, PT [facility]   04/19/2021  9:00 AM Marcelyn Ditty, PT [facility]   04/24/2021  8:45 AM Marcelyn Ditty, PT [facility]   04/26/2021  8:45 AM Marcelyn Ditty, PT [facility]   05/01/2021  8:45 AM Marcelyn Ditty, PT [facility]   05/03/2021  8:45 AM Marcelyn Ditty, PT [facility]   05/21/2021  8:30 AM Cameron Proud, MD [facility]   09/06/2021  8:00 AM [facility]   09/06/2021  8:30 AM Hector Shade, MD [facility]

## 2021-03-23 ENCOUNTER — Inpatient Hospital Stay: Admit: 2021-03-23 | Payer: MEDICARE | Primary: Family Medicine

## 2021-03-23 NOTE — Progress Notes (Signed)
Chad Rosario  DOB: 1956/02/10  Primary: Medicare Part A And B (Medicare)  Secondary: [facility] MEDICARE SUPP [facility]  [address]  [address]  [place] [address]  Phone: [phone]  Fax: [phone] Plan Frequency: 2 times a week    Plan of Care/Certification Expiration Date: 06/12/21      PT Visit Info:  Plan Frequency: 2 times a week  Plan of Care/Certification Expiration Date: 06/12/21  Total # of Visits to Date: 3     Visit Count:  3   OUTPATIENT PHYSICAL THERAPY:OP NOTE TYPE: Treatment Note 03/23/2021       Episode  }Appt Desk             Treatment Diagnosis:  Low Back Pain (M54.5)  Abnormal posture (R29.3)  Medical/Referring Diagnosis:  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]  History of lumbar fusion [Z98.1]  History of lumbar discectomy [O96.295]  Referring Physician:  Reuel Boom, MD  MD Orders:  PT Eval and Treat   Date of Onset:  Onset Date: 03/24/20     Allergies:   Hydrocodone-acetaminophen, Losartan, Amlodipine, Rosuvastatin, Atorvastatin, and Ezetimibe  Restrictions/Precautions:  Restrictions/Precautions: None  Required Braces or Orthoses?: No  No data recorded     Interventions Planned (Treatment may consist of any combination of the following):    Current Treatment Recommendations: Strengthening; ROM; Balance training; Functional mobility training; Transfer training; Gait training; Stair training; Neuromuscular re-education; Manual; Modalities; Home exercise program     Subjective Comments:  patient reports increased soreness after first session, states that he has been feeling like he has been improving.  Initial:}    4/10Post Session:       2/10  Medications Last Reviewed:  03/23/2021  Updated Objective Findings:  None Today  Treatment   THERAPEUTIC EXERCISE: (40 minutes):    Exercises per grid below to improve mobility, strength, and balance.  Required moderate visual, verbal, manual, and tactile cues to promote proper body alignment, promote proper body posture, and  promote proper body mechanics.  Progressed resistance, range, and repetitions as indicated.     Date:  12/28 Date:  12/30 Date:     Activity/Exercise Parameters Parameters Parameters   Nustep  L2 x 10 min  L2.5 x 10 min     Lumbar flexion stretch Seated forward/lateral 3 x 15''  Seated forward/lateral 3 x 15''     Lower trunk rotation 5 ea way  5 ea way    Knee to chest 5 x 15'' holds BLE 5 x 15'' holds BLE    Upper thoracic rotation 2 x 15'' holds  2 x 15'' holds     Core strengthening Ppt x 15   Ppt w/ isometric hip flexion 5 x 10 sec holds      Rhomboid stretch  W/ door frame 2 x 15 sec     Education Discussed importance of posture and decreasing tone      Hip flexor stretch Standing x 5  Standing x 5       MANUAL THERAPY: (15 minutes):   Joint mobilization, Soft tissue mobilization, and Manipulation was utilized and necessary because of the patient's restricted joint motion, painful spasm, loss of articular motion, and restricted motion of soft tissue.     STM/MFR to mid-thoracic paraspinals and rhomboids in prone    Treatment/Session Summary:    Treatment Assessment:  tolerated session well, responded well to gentle STM today and reported a decrease in stiffness after session.  Communication/Consultation:  None  today  Equipment provided today:  None  Recommendations/Intent for next treatment session: Next visit will focus on decreasing tone of mid-thoracic paraspinals and bilateral rhomboids.    Total Treatment Billable Duration:  40 therex minutes, 15 manual therapy mintues  Time In: 0845  Time Out: 0940    Marcelyn Ditty, PT       Charge Capture  }Post Session Pain  PT Visit Info  MedBridge Portal  MD Guidelines  Scanned Media  Benefits  MyChart    Future Appointments   Date Time Provider Department Center   03/27/2021  8:45 AM Marcelyn Ditty, PT [facility]   03/29/2021  8:45 AM Marcelyn Ditty, PT [facility]   04/03/2021  8:30 AM Marcelyn Ditty, PT [facility]   04/05/2021  8:10 AM [facility]    04/05/2021  2:30 PM Marcelyn Ditty, PT [facility]   04/10/2021  8:45 AM Marcelyn Ditty, PT [facility]   04/12/2021  2:30 PM Marcelyn Ditty, PT [facility]   04/13/2021  8:00 AM [name], MD [facility]   04/17/2021  8:45 AM Marcelyn Ditty, PT [facility]   04/19/2021  9:00 AM Marcelyn Ditty, PT [facility]   04/24/2021  8:45 AM Marcelyn Ditty, PT [facility]   04/26/2021  8:45 AM Marcelyn Ditty, PT [facility]   05/01/2021  8:45 AM Marcelyn Ditty, PT [facility]   05/03/2021  8:45 AM Marcelyn Ditty, PT [facility]   05/21/2021  8:30 AM Cameron Proud, MD [facility]   09/06/2021  8:00 AM [facility]   09/06/2021  8:30 AM Hector Shade, MD [facility]

## 2021-03-27 ENCOUNTER — Inpatient Hospital Stay: Admit: 2021-03-27 | Payer: MEDICARE | Primary: Family Medicine

## 2021-03-27 DIAGNOSIS — Z981 Arthrodesis status: Secondary | ICD-10-CM

## 2021-03-27 NOTE — Progress Notes (Signed)
Chad Rosario  DOB: 09/03/1955  Primary: Medicare Part A And B (Medicare)  Secondary: [facility] MEDICARE SUPP [facility]  [address]  [address]  [place] [address]  Phone: [phone]  Fax: [phone] Plan Frequency: 2 times a week    Plan of Care/Certification Expiration Date: 06/12/21      PT Visit Info:  Plan Frequency: 2 times a week  Plan of Care/Certification Expiration Date: 06/12/21  Total # of Visits to Date: 4     Visit Count:  4   OUTPATIENT PHYSICAL THERAPY:OP NOTE TYPE: Treatment Note 03/27/2021       Episode  }Appt Desk             Treatment Diagnosis:  Low Back Pain (M54.5)  Abnormal posture (R29.3)  Medical/Referring Diagnosis:  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]  History of lumbar fusion [Z98.1]  History of lumbar discectomy [J85.631]  Referring Physician:  Reuel Boom, MD  MD Orders:  PT Eval and Treat   Date of Onset:  Onset Date: 03/24/20     Allergies:   Hydrocodone-acetaminophen, Losartan, Amlodipine, Rosuvastatin, Atorvastatin, and Ezetimibe  Restrictions/Precautions:  Restrictions/Precautions: None  Required Braces or Orthoses?: No  No data recorded     Interventions Planned (Treatment may consist of any combination of the following):    Current Treatment Recommendations: Strengthening; ROM; Balance training; Functional mobility training; Transfer training; Gait training; Stair training; Neuromuscular re-education; Manual; Modalities; Home exercise program     Subjective Comments:  pt reports that he felt minimal soreness after the last session however he was walking around [place] and he had a bit of tumble in which he didn't fall but had to take a few reactionary steps which caused his back to have significant soreness.  Initial:}    6/10Post Session:       5/10  Medications Last Reviewed:  03/27/2021  Updated Objective Findings:  None Today  Treatment   THERAPEUTIC EXERCISE: (45 minutes):    Exercises per grid below to improve mobility, strength, and balance.   Required moderate visual, verbal, manual, and tactile cues to promote proper body alignment, promote proper body posture, and promote proper body mechanics.  Progressed resistance, range, and repetitions as indicated.     Date:  12/28 Date:  12/30 Date:  03/27/21   Activity/Exercise Parameters Parameters Parameters   Nustep  L2 x 10 min  L2.5 x 10 min  L3 x 10 min    Lumbar flexion stretch Seated forward/lateral 3 x 15''  Seated forward/lateral 3 x 15''  Seated forward/lateral 3 x 15''    Lower trunk rotation 5 ea way  5 ea way 5 ea way   Knee to chest 5 x 15'' holds BLE 5 x 15'' holds BLE 5 x 15'' holds BLE   Upper thoracic rotation 2 x 15'' holds  2 x 15'' holds  2 x 15'' holds    Core strengthening Ppt x 15   Ppt w/ isometric hip flexion 5 x 10 sec holds   TA contraction with isometric hip abduction in hook-lying x 10    Rhomboid stretch  W/ door frame 2 x 15 sec     Education Discussed importance of posture and decreasing tone      Hip flexor stretch Standing x 5  Standing x 5  Standing x 5   Side-lying w/ therapist assistance x 30 sec      MANUAL THERAPY: (00 minutes):   Joint mobilization, Soft tissue mobilization, and  Manipulation was utilized and necessary because of the patient's restricted joint motion, painful spasm, loss of articular motion, and restricted motion of soft tissue.     STM/MFR to mid-thoracic paraspinals and rhomboids in prone    Treatment/Session Summary:    Treatment Assessment:  did well throughout session, able to complete most supine stretches without significant response of pain.  Communication/Consultation:  None today  Equipment provided today:  None  Recommendations/Intent for next treatment session: Next visit will focus on decreasing tone of mid-thoracic paraspinals and bilateral rhomboids.    Total Treatment Billable Duration:  40 therex minutes, 15 manual therapy mintues  Time In: 0845  Time Out: 0930    Marcelyn Ditty, PT       Charge Capture  }Post Session Pain  PT Visit Info   MedBridge Portal  MD Guidelines  Scanned Media  Benefits  MyChart    Future Appointments   Date Time Provider Department Center   03/29/2021  8:45 AM Marcelyn Ditty, PT [facility]   04/03/2021  8:30 AM Marcelyn Ditty, PT [facility]   04/05/2021  8:10 AM [facility]   04/05/2021  2:30 PM Marcelyn Ditty, PT [facility]   04/10/2021  8:45 AM Marcelyn Ditty, PT [facility]   04/12/2021  2:30 PM Marcelyn Ditty, PT [facility]   04/13/2021  8:00 AM [name], MD [facility]   04/17/2021  8:45 AM Marcelyn Ditty, PT [facility]   04/19/2021  9:00 AM Marcelyn Ditty, PT [facility]   04/24/2021  8:45 AM Marcelyn Ditty, PT [facility]   04/26/2021  8:45 AM Marcelyn Ditty, PT [facility]   05/01/2021  8:45 AM Marcelyn Ditty, PT [facility]   05/03/2021  8:45 AM Marcelyn Ditty, PT [facility]   05/21/2021  8:30 AM Cameron Proud, MD [facility]   09/06/2021  8:00 AM [facility]   09/06/2021  8:30 AM Hector Shade, MD [facility]

## 2021-03-29 ENCOUNTER — Inpatient Hospital Stay: Admit: 2021-03-29 | Payer: MEDICARE | Primary: Family Medicine

## 2021-03-29 NOTE — Progress Notes (Signed)
Chad Rosario  DOB: 08-21-1955  Primary: Medicare Part A And B (Medicare)  Secondary: [facility] MEDICARE SUPP [facility] [employer]  [address]  [address]  [place] [address]  Phone: [phone]  Fax: [phone] Plan Frequency: 2 times a week    Plan of Care/Certification Expiration Date: 06/12/21      PT Visit Info:  Plan Frequency: 2 times a week  Plan of Care/Certification Expiration Date: 06/12/21  Total # of Visits to Date: 5     Visit Count:  5   OUTPATIENT PHYSICAL THERAPY:OP NOTE TYPE: Treatment Note 03/29/2021       Episode  }Appt Desk             Treatment Diagnosis:  Low Back Pain (M54.5)  Abnormal posture (R29.3)  Medical/Referring Diagnosis:  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]  History of lumbar fusion [Z98.1]  History of lumbar discectomy [S28.315]  Referring Physician:  Reuel Boom, MD  MD Orders:  PT Eval and Treat   Date of Onset:  Onset Date: 03/24/20     Allergies:   Hydrocodone-acetaminophen, Losartan, Amlodipine, Rosuvastatin, Atorvastatin, and Ezetimibe  Restrictions/Precautions:  Restrictions/Precautions: None  Required Braces or Orthoses?: No  No data recorded     Interventions Planned (Treatment may consist of any combination of the following):    Current Treatment Recommendations: Strengthening; ROM; Balance training; Functional mobility training; Transfer training; Gait training; Stair training; Neuromuscular re-education; Manual; Modalities; Home exercise program     Subjective Comments:  patient reports that he feels like he has been improving but continues to have increased difficulty with changing positions in a quick manner.  Initial:}    2/10Post Session:       1/10  Medications Last Reviewed:  03/29/2021  Updated Objective Findings:  None Today  Treatment   THERAPEUTIC EXERCISE: (40 minutes):    Exercises per grid below to improve mobility, strength, and balance.  Required moderate visual, verbal, manual, and tactile cues to promote proper body alignment, promote  proper body posture, and promote proper body mechanics.  Progressed resistance, range, and repetitions as indicated.     Date:  12/28 Date:  12/30 Date:  03/27/21 Date:   03/29/21   Activity/Exercise Parameters Parameters Parameters Parameters   Nustep  L2 x 10 min  L2.5 x 10 min  L3 x 10 min  L2.5 x 10 min    Lumbar flexion stretch Seated forward/lateral 3 x 15''  Seated forward/lateral 3 x 15''  Seated forward/lateral 3 x 15''  Seated forward/lateral 3 x 15''    Lower trunk rotation 5 ea way  5 ea way 5 ea way 5 ea way    Knee to chest 5 x 15'' holds BLE 5 x 15'' holds BLE 5 x 15'' holds BLE 5 x 15'' holds BLE   Upper thoracic rotation 2 x 15'' holds  2 x 15'' holds  2 x 15'' holds     Core strengthening Ppt x 15   Ppt w/ isometric hip flexion 5 x 10 sec holds   TA contraction with isometric hip abduction in hook-lying x 10  Ppt x 15   Ppt w/ supine marching x 10 BLE   TA contraction with isometric hip abduction in hook-lying x 10    Rhomboid stretch  W/ door frame 2 x 15 sec      Education Discussed importance of posture and decreasing tone       Hip flexor stretch Standing x 5  Standing x  5  Standing x 5   Side-lying w/ therapist assistance x 30 sec  Standing x 5   Side-lying w/ therapist assistance x 30 sec      MANUAL THERAPY: (15 minutes):   Joint mobilization, Soft tissue mobilization, and Manipulation was utilized and necessary because of the patient's restricted joint motion, painful spasm, loss of articular motion, and restricted motion of soft tissue.     STM/MFR to mid-thoracic paraspinals and rhomboids in prone    Treatment/Session Summary:    Treatment Assessment:  [name] demonstrated increased mobility of lower extremity, discussed about consistently performing an ellipitical at his gym in order to get more mobility and open up his hips.  Communication/Consultation:  None today  Equipment provided today:  None  Recommendations/Intent for next treatment session: Next visit will focus on decreasing  tone of mid-thoracic paraspinals and bilateral rhomboids.    Total Treatment Billable Duration:  40 therex minutes, 15 manual therapy mintues  Time In: 0845  Time Out: 0940    Marcelyn Ditty, PT       Charge Capture  }Post Session Pain  PT Visit Info  MedBridge Portal  MD Guidelines  Scanned Media  Benefits  MyChart    Future Appointments   Date Time Provider Department Center   04/03/2021  8:30 AM Marcelyn Ditty, PT [facility] [place] [facility]   04/05/2021  8:10 AM [facility] OUTREACH INSURANCE [facility]   04/05/2021  2:30 PM Marcelyn Ditty, PT [facility]   04/10/2021  8:45 AM Marcelyn Ditty, PT [facility]   04/12/2021  2:30 PM Marcelyn Ditty, PT [facility]   04/13/2021  8:00 AM [name], MD [facility]   04/17/2021  8:45 AM Marcelyn Ditty, PT [facility]   04/19/2021  9:00 AM Marcelyn Ditty, PT [facility]   04/24/2021  8:45 AM Marcelyn Ditty, PT [facility]   04/26/2021  8:45 AM Marcelyn Ditty, PT [facility]   05/01/2021  8:45 AM Marcelyn Ditty, PT [facility]   05/03/2021  8:45 AM Marcelyn Ditty, PT [facility]   05/21/2021  8:30 AM Cameron Proud, MD [facility]   09/06/2021  8:00 AM [facility]REACH INSURANCE [facility]   09/06/2021  8:30 AM Hector Shade, MD [facility]

## 2021-04-03 ENCOUNTER — Inpatient Hospital Stay: Admit: 2021-04-03 | Payer: MEDICARE | Primary: Family Medicine

## 2021-04-03 NOTE — Progress Notes (Signed)
Chad Rosario  DOB: April 27, 1955  Primary: Medicare Part A And B (Medicare)  Secondary: [facility] MEDICARE SUPP [facility] [employer]  [address]  [address]  [place] [address]  Phone: [phone]  Fax: [phone] Plan Frequency: 2 times a week    Plan of Care/Certification Expiration Date: 06/12/21      PT Visit Info:  Plan Frequency: 2 times a week  Plan of Care/Certification Expiration Date: 06/12/21  Total # of Visits to Date: 6     Visit Count:  6   OUTPATIENT PHYSICAL THERAPY:OP NOTE TYPE: Treatment Note 04/03/2021       Episode  }Appt Desk             Treatment Diagnosis:  Low Back Pain (M54.5)  Abnormal posture (R29.3)  Medical/Referring Diagnosis:  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]  History of lumbar fusion [Z98.1]  History of lumbar discectomy [A30.940]  Referring Physician:  Reuel Boom, MD  MD Orders:  PT Eval and Treat   Date of Onset:  Onset Date: 03/24/20     Allergies:   Hydrocodone-acetaminophen, Losartan, Amlodipine, Rosuvastatin, Atorvastatin, and Ezetimibe  Restrictions/Precautions:  Restrictions/Precautions: None  Required Braces or Orthoses?: No  No data recorded     Interventions Planned (Treatment may consist of any combination of the following):    Current Treatment Recommendations: Strengthening; ROM; Balance training; Functional mobility training; Transfer training; Gait training; Stair training; Neuromuscular re-education; Manual; Modalities; Home exercise program     Subjective Comments:  reports that over the weekend he was getting out of his car and accidentally stepped into a whole causing him to fall down to his knees, was able to recover on his own however reports increased soreness/stiffness in lower back following this incident.  Initial:}    4/10Post Session:       3/10  Medications Last Reviewed:  04/03/2021  Updated Objective Findings:  None Today  Treatment   THERAPEUTIC EXERCISE: (35 minutes):    Exercises per grid below to improve mobility, strength, and  balance.  Required moderate visual, verbal, manual, and tactile cues to promote proper body alignment, promote proper body posture, and promote proper body mechanics.  Progressed resistance, range, and repetitions as indicated.     Date:  12/28 Date:  12/30 Date:  03/27/21 Date:   03/29/21 Date:   04/03/21   Activity/Exercise Parameters Parameters Parameters Parameters Parameters   Nustep  L2 x 10 min  L2.5 x 10 min  L3 x 10 min  L2.5 x 10 min  L3 x 10 min    Lumbar flexion stretch Seated forward/lateral 3 x 15''  Seated forward/lateral 3 x 15''  Seated forward/lateral 3 x 15''  Seated forward/lateral 3 x 15''  Seated forward/lateral 3 x 15''    Lower trunk rotation 5 ea way  5 ea way 5 ea way 5 ea way  5 ea way    Knee to chest 5 x 15'' holds BLE 5 x 15'' holds BLE 5 x 15'' holds BLE 5 x 15'' holds BLE 5 x 15'' holds BLE   Upper thoracic rotation 2 x 15'' holds  2 x 15'' holds  2 x 15'' holds      Core strengthening Ppt x 15   Ppt w/ isometric hip flexion 5 x 10 sec holds   TA contraction with isometric hip abduction in hook-lying x 10  Ppt x 15   Ppt w/ supine marching x 10 BLE   TA contraction with isometric hip  abduction in hook-lying x 10  Ppt x 15   Ppt w/ supine bridge x 10    Rhomboid stretch  W/ door frame 2 x 15 sec    Arms crossed x 5    Education Discussed importance of posture and decreasing tone        Hip flexor stretch Standing x 5  Standing x 5  Standing x 5   Side-lying w/ therapist assistance x 30 sec  Standing x 5   Side-lying w/ therapist assistance x 30 sec  Standing x 5   Side-lying w/ therapist assistance x 30 sec      MANUAL THERAPY: (10 minutes):   Joint mobilization, Soft tissue mobilization, and Manipulation was utilized and necessary because of the patient's restricted joint motion, painful spasm, loss of articular motion, and restricted motion of soft tissue.     STM/MFR to mid-thoracic paraspinals and rhomboids in prone    Treatment/Session Summary:    Treatment Assessment:  [name]  had increased difficulty during todays session due to increased soreness/stiffness from the incident that he experienced over the weekend.  Demonstrated decreased tone in scapular region following manual therapy that was completed.  Communication/Consultation:  None today  Equipment provided today:  None  Recommendations/Intent for next treatment session: Next visit will focus on decreasing tone of mid-thoracic paraspinals and bilateral rhomboids.    Total Treatment Billable Duration:  35 therex minutes, 10 manual therapy mintues  Time In: 0845  Time Out: 0930    Chad Rosario, PT       Charge Capture  }Post Session Pain  PT Visit Info  MedBridge Portal  MD Guidelines  Scanned Media  Benefits  MyChart    Future Appointments   Date Time Provider Department Center   04/05/2021  8:10 AM [facility] OUTREACH INSURANCE [facility]   04/05/2021  2:30 PM Chad Rosario, PT [facility]   04/10/2021  8:45 AM Chad Rosario, PT [facility]   04/12/2021  2:30 PM Chad Rosario, PT [facility]   04/13/2021  8:00 AM Chad Ranks, MD [facility]   04/17/2021  8:45 AM Chad Rosario, PT [facility]   04/19/2021  9:00 AM Chad Rosario, PT [facility]   04/24/2021  8:45 AM Chad Rosario, PT [facility]   04/26/2021  8:45 AM Chad Rosario, PT [facility]   05/01/2021  8:45 AM Chad Rosario, PT [facility]   05/03/2021  8:45 AM Chad Rosario, PT [facility]   05/21/2021  8:30 AM Chad Proud, MD [facility]   09/06/2021  8:00 AM [facility]REACH INSURANCE [facility]   09/06/2021  8:30 AM Chad Shade, MD [facility]

## 2021-04-05 ENCOUNTER — Inpatient Hospital Stay: Admit: 2021-04-05 | Payer: MEDICARE | Primary: Family Medicine

## 2021-04-05 DIAGNOSIS — D696 Thrombocytopenia, unspecified: Secondary | ICD-10-CM

## 2021-04-05 LAB — CBC WITH AUTO DIFFERENTIAL
Absolute Eos #: 1 10*3/uL — ABNORMAL HIGH (ref 0.0–0.8)
Absolute Immature Granulocyte: 0 10*3/uL (ref 0.0–0.5)
Absolute Lymph #: 1.7 10*3/uL (ref 0.5–4.6)
Absolute Mono #: 0.8 10*3/uL (ref 0.1–1.3)
Basophils Absolute: 0.1 10*3/uL (ref 0.0–0.2)
Basophils: 1 % (ref 0.0–2.0)
Eosinophils %: 12 % — ABNORMAL HIGH (ref 0.5–7.8)
Hematocrit: 47.5 %
Hemoglobin: 15.8 g/dL (ref 13.6–17.2)
Immature Granulocytes: 0 % (ref 0.0–5.0)
Lymphocytes: 21 % (ref 13–44)
MCH: 28 PG (ref 26.1–32.9)
MCHC: 33.3 g/dL (ref 31.4–35.0)
MCV: 84.1 FL (ref 82.0–102.0)
MPV: 12.4 FL — ABNORMAL HIGH (ref 9.4–12.3)
Monocytes: 10 % (ref 4.0–12.0)
Platelets: 78 10*3/uL — ABNORMAL LOW (ref 150–450)
RBC: 5.65 M/uL — ABNORMAL HIGH (ref 4.23–5.6)
RDW: 16.5 % — ABNORMAL HIGH (ref 11.9–14.6)
Seg Neutrophils: 56 % (ref 43–78)
Segs Absolute: 4.5 10*3/uL (ref 1.7–8.2)
WBC: 8 10*3/uL (ref 4.3–11.1)
nRBC: 0 10*3/uL (ref 0.0–0.2)

## 2021-04-05 NOTE — Progress Notes (Signed)
Chad Rosario  DOB: [birth year]  Primary: Medicare Part A And B (Medicare)  Secondary: [facility] MEDICARE SUPP [facility] [employer]  [address]  [address]  [place] [address]  Phone: [phone]  Fax: [phone] Plan Frequency: 2 times a week    Plan of Care/Certification Expiration Date: 06/12/21      PT Visit Info:  Plan Frequency: 2 times a week  Plan of Care/Certification Expiration Date: 06/12/21  Total # of Visits to Date: 7     Visit Count:  7   OUTPATIENT PHYSICAL THERAPY:OP NOTE TYPE: Treatment Note 04/05/2021       Episode  }Appt Desk             Treatment Diagnosis:  Low Back Pain (M54.5)  Abnormal posture (R29.3)  Medical/Referring Diagnosis:  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]  History of lumbar fusion [Z98.1]  History of lumbar discectomy [T70.017]  Referring Physician:  Reuel Boom, MD  MD Orders:  PT Eval and Treat   Date of Onset:  Onset Date: 03/24/20     Allergies:   Hydrocodone-acetaminophen, Losartan, Amlodipine, Rosuvastatin, Atorvastatin, and Ezetimibe  Restrictions/Precautions:  Restrictions/Precautions: None  Required Braces or Orthoses?: No  No data recorded     Interventions Planned (Treatment may consist of any combination of the following):    Current Treatment Recommendations: Strengthening; ROM; Balance training; Functional mobility training; Transfer training; Gait training; Stair training; Neuromuscular re-education; Manual; Modalities; Home exercise program     Subjective Comments:  patient reports that he feels like he has been making a bit of progress and that its not as painful getting out of bed as it was before.  Initial:}    2/10Post Session:       1/10  Medications Last Reviewed:  04/05/2021  Updated Objective Findings:  None Today  Treatment   THERAPEUTIC EXERCISE: (25 minutes):    Exercises per grid below to improve mobility, strength, and balance.  Required moderate visual, verbal, manual, and tactile cues to promote proper body alignment, promote proper  body posture, and promote proper body mechanics.  Progressed resistance, range, and repetitions as indicated.     Date:  12/28 Date:  12/30 Date:  03/27/21 Date:   03/29/21 Date:   04/03/21 Date:   04/05/21   Activity/Exercise Parameters Parameters Parameters Parameters Parameters Parameters   Nustep  L2 x 10 min  L2.5 x 10 min  L3 x 10 min  L2.5 x 10 min  L3 x 10 min  L3 x 10 min    Lumbar flexion stretch Seated forward/lateral 3 x 15''  Seated forward/lateral 3 x 15''  Seated forward/lateral 3 x 15''  Seated forward/lateral 3 x 15''  Seated forward/lateral 3 x 15''  Seated forward/lateral 3 x 15''    Lower trunk rotation 5 ea way  5 ea way 5 ea way 5 ea way  5 ea way  2 ea way    Knee to chest 5 x 15'' holds BLE 5 x 15'' holds BLE 5 x 15'' holds BLE 5 x 15'' holds BLE 5 x 15'' holds BLE    Upper thoracic rotation 2 x 15'' holds  2 x 15'' holds  2 x 15'' holds       Core strengthening Ppt x 15   Ppt w/ isometric hip flexion 5 x 10 sec holds   TA contraction with isometric hip abduction in hook-lying x 10  Ppt x 15   Ppt w/ supine marching x 10 BLE  TA contraction with isometric hip abduction in hook-lying x 10  Ppt x 15   Ppt w/ supine bridge x 10     Rhomboid stretch  W/ door frame 2 x 15 sec    Arms crossed x 5     Education Discussed importance of posture and decreasing tone      Discussed w/ patient importance of improving overall stride length to increase hip mobility    Hip flexor stretch Standing x 5  Standing x 5  Standing x 5   Side-lying w/ therapist assistance x 30 sec  Standing x 5   Side-lying w/ therapist assistance x 30 sec  Standing x 5   Side-lying w/ therapist assistance x 30 sec  Standing x 5   Side-lying w/ therapist assistance x 30 sec      MANUAL THERAPY: (20 minutes):   Joint mobilization, Soft tissue mobilization, and Manipulation was utilized and necessary because of the patient's restricted joint motion, painful spasm, loss of articular motion, and restricted motion of soft tissue.     STM/MFR  to mid-thoracic paraspinals and rhomboids in prone  Iliacus release in supine to bilateral hips    Treatment/Session Summary:    Treatment Assessment:  mr. Zeiser did well, had increased tone and tightness in bilateral hip flexors when performing the iliacus release, will assess next session.  Communication/Consultation:  None today  Equipment provided today:  None  Recommendations/Intent for next treatment session: Next visit will focus on decreasing tone of mid-thoracic paraspinals and bilateral rhomboids.    Total Treatment Billable Duration:  25 therex minutes, 20 manual therapy mintues  Time In: 1430  Time Out: 1515    Marcelyn Ditty, PT       Charge Capture  }Post Session Pain  PT Visit Info  MedBridge Portal  MD Guidelines  Scanned Media  Benefits  MyChart    Future Appointments   Date Time Provider Department Center   04/10/2021  8:45 AM Marcelyn Ditty, PT [facility]   04/12/2021  9:00 AM Melburn Popper, PT [facility]   04/13/2021  8:00 AM [name], MD [facility]   04/17/2021  8:45 AM Marcelyn Ditty, PT [facility]   04/19/2021  9:00 AM Marcelyn Ditty, PT [facility]   04/24/2021  8:45 AM Marcelyn Ditty, PT [facility]   04/26/2021  8:45 AM Marcelyn Ditty, PT [facility]   05/01/2021  8:45 AM Marcelyn Ditty, PT [facility]   05/03/2021  8:45 AM Marcelyn Ditty, PT [facility]   05/21/2021  8:30 AM Cameron Proud, MD [facility]   09/06/2021  8:00 AM [facility]REACH INSURANCE [facility]   09/06/2021  8:30 AM Hector Shade, MD [facility]

## 2021-04-06 NOTE — Telephone Encounter (Addendum)
Pt notified and verbalized understanding.    ----- Message from Hector Shade, MD sent at 04/05/2021  1:08 PM EST -----  Platelet count slowly improving. C/w mgmt as planned.

## 2021-04-10 ENCOUNTER — Encounter: Payer: MEDICARE | Primary: Family Medicine

## 2021-04-10 NOTE — Progress Notes (Addendum)
Chad Rosario  DOB: 06/24/1955  Primary: Medicare Part A And B (Medicare)  Secondary: [facility] MEDICARE SUPP [facility] [employer]  [address]  [address]  [place] [address]  Phone: [phone]  Fax: [phone] Plan Frequency: 2 times a week    Plan of Care/Certification Expiration Date: 06/12/21      PT Visit Info:  Plan Frequency: 2 times a week  Plan of Care/Certification Expiration Date: 06/12/21  Total # of Visits to Date: 8     Visit Count:  8   OUTPATIENT PHYSICAL THERAPY:OP NOTE TYPE: Treatment Note 04/10/2021       Episode  }Appt Desk             Treatment Diagnosis:  Low Back Pain (M54.5)  Abnormal posture (R29.3)  Medical/Referring Diagnosis:  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]  History of lumbar fusion [Z98.1]  History of lumbar discectomy [H20.947]  Referring Physician:  Reuel Boom, MD  MD Orders:  PT Eval and Treat   Date of Onset:  Onset Date: 03/24/20     Allergies:   Hydrocodone-acetaminophen, Losartan, Amlodipine, Rosuvastatin, Atorvastatin, and Ezetimibe  Restrictions/Precautions:  Restrictions/Precautions: None  Required Braces or Orthoses?: No  No data recorded     Interventions Planned (Treatment may consist of any combination of the following):    Current Treatment Recommendations: Strengthening; ROM; Balance training; Functional mobility training; Transfer training; Gait training; Stair training; Neuromuscular re-education; Manual; Modalities; Home exercise program     Subjective Comments:  Chad Rosario reports that he is feeling good, states that yesterday was the first time in a while in which he had no pain throughout most of the day.  Initial:}    2/10Post Session:       2/10  Medications Last Reviewed:  04/10/2021  Updated Objective Findings:  None Today  Treatment   THERAPEUTIC EXERCISE: (35 minutes):    Exercises per grid below to improve mobility, strength, and balance.  Required moderate visual, verbal, manual, and tactile cues to promote proper body alignment,  promote proper body posture, and promote proper body mechanics.  Progressed resistance, range, and repetitions as indicated.     Date:  03/27/21 Date:   03/29/21 Date:   04/03/21 Date:   04/05/21 Date:  04/10/21   Activity/Exercise Parameters Parameters Parameters Parameters    Nustep  L3 x 10 min  L2.5 x 10 min  L3 x 10 min  L3 x 10 min  L3.5 x 10 min    Lumbar flexion stretch Seated forward/lateral 3 x 15''  Seated forward/lateral 3 x 15''  Seated forward/lateral 3 x 15''  Seated forward/lateral 3 x 15''  Seated forward/lateral 3 x 15''    Lower trunk rotation 5 ea way 5 ea way  5 ea way  2 ea way  2 ea way    Knee to chest 5 x 15'' holds BLE 5 x 15'' holds BLE 5 x 15'' holds BLE  2 x 15 sec holds    Upper thoracic rotation 2 x 15'' holds        Core strengthening TA contraction with isometric hip abduction in hook-lying x 10  Ppt x 15   Ppt w/ supine marching x 10 BLE   TA contraction with isometric hip abduction in hook-lying x 10  Ppt x 15   Ppt w/ supine bridge x 10   Ppt x 15   Ppt w/ supine marching x 10 BLE   TA contraction with isometric hip abduction in  hook-lying x 10    Rhomboid stretch   Arms crossed x 5   Arms crossed x 5   Education    Discussed w/ patient importance of improving overall stride length to increase hip mobility     Hip flexor stretch Standing x 5   Side-lying w/ therapist assistance x 30 sec  Standing x 5   Side-lying w/ therapist assistance x 30 sec  Standing x 5   Side-lying w/ therapist assistance x 30 sec  Standing x 5   Side-lying w/ therapist assistance x 30 sec  Standing x 5      MANUAL THERAPY: (10 minutes):   Joint mobilization, Soft tissue mobilization, and Manipulation was utilized and necessary because of the patient's restricted joint motion, painful spasm, loss of articular motion, and restricted motion of soft tissue.     STM/MFR to mid-thoracic paraspinals and rhomboids in prone tool-assisted   Iliacus release in supine to bilateral hips NOT DONE TODAY    MODALITIES: (5 min no  charge)  Heating pad in prone for analgesic response     Treatment/Session Summary:    Treatment Assessment:  Chad Rosario is showing decreased tightness and tone in bilateral scapular musculature as well as low back, progressed on working more on core strengthenging today and patient tolerated session fairly well.  Communication/Consultation:  None today  Equipment provided today:  None  Recommendations/Intent for next treatment session: Next visit will focus on decreasing tone of mid-thoracic paraspinals and bilateral rhomboids.    Total Treatment Billable Duration:  35 therex minutes, 10 manual therapy mintues  Time In: 0845  Time Out: 0930    Marcelyn Ditty, PT       Charge Capture  }Post Session Pain  PT Visit Info  MedBridge Portal  MD Guidelines  Scanned Media  Benefits  MyChart    Future Appointments   Date Time Provider Department Center   04/12/2021  9:00 AM Melburn Popper, PT [facility]   04/13/2021  8:00 AM [name], MD [facility]   04/17/2021  8:45 AM Marcelyn Ditty, PT [facility]   04/19/2021  9:00 AM Marcelyn Ditty, PT [facility]   04/24/2021  8:45 AM Marcelyn Ditty, PT [facility]   04/26/2021  8:45 AM Marcelyn Ditty, PT [facility]   05/01/2021  8:45 AM Marcelyn Ditty, PT [facility]   05/03/2021  8:45 AM Marcelyn Ditty, PT [facility]   05/21/2021  8:30 AM Cameron Proud, MD [facility]   09/06/2021  8:00 AM [facility]REACH INSURANCE [facility]   09/06/2021  8:30 AM Hector Shade, MD [facility]

## 2021-04-12 ENCOUNTER — Inpatient Hospital Stay: Admit: 2021-04-12 | Payer: MEDICARE | Primary: Family Medicine

## 2021-04-12 NOTE — Progress Notes (Signed)
Chad Rosario  DOB: Jul 26, 1955  Primary: Medicare Part A And B (Medicare)  Secondary: [facility] MEDICARE SUPP [facility]  [address]  [address]  [place] [address]  Phone: [phone]  Fax: [phone] Plan Frequency: 2 times a week    Plan of Care/Certification Expiration Date: 06/12/21      PT Visit Info:  Plan Frequency: 2 times a week  Plan of Care/Certification Expiration Date: 06/12/21  Total # of Visits to Date: 9     Visit Count:  9   OUTPATIENT PHYSICAL THERAPY:OP NOTE TYPE: Treatment Note 04/12/2021       Episode  }Appt Desk             Treatment Diagnosis:  Low Back Pain (M54.5)  Abnormal posture (R29.3)  Medical/Referring Diagnosis:  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]  History of lumbar fusion [Z98.1]  History of lumbar discectomy [Q94.503]  Referring Physician:  Reuel Boom, MD  MD Orders:  PT Eval and Treat   Date of Onset:  Onset Date: 03/24/20     Allergies:   Hydrocodone-acetaminophen, Losartan, Amlodipine, Rosuvastatin, Atorvastatin, and Ezetimibe  Restrictions/Precautions:  Restrictions/Precautions: None  Required Braces or Orthoses?: No  No data recorded     Interventions Planned (Treatment may consist of any combination of the following):    Current Treatment Recommendations: Strengthening; ROM; Balance training; Functional mobility training; Transfer training; Gait training; Stair training; Neuromuscular re-education; Manual; Modalities; Home exercise program     Subjective Comments:  Pt reports feeling sore in his muscles with all the exercises last time. Notes soreness in lower back muscles not core.  Initial:}    2/10Post Session:       1/10  Medications Last Reviewed:  04/12/2021  Updated Objective Findings:  None Today  Treatment   THERAPEUTIC EXERCISE: (30 minutes):    Exercises per grid below to improve mobility, strength, and balance.  Required moderate visual, verbal, manual, and tactile cues to promote proper body alignment, promote proper body posture, and  promote proper body mechanics.  Progressed resistance, range, and repetitions as indicated.     Date:  03/27/21 Date:   03/29/21 Date:   04/03/21 Date:   04/05/21 Date:  04/10/21 Date:  04/12/21   Activity/Exercise Parameters Parameters Parameters Parameters  Parameters   Nustep  L3 x 10 min  L2.5 x 10 min  L3 x 10 min  L3 x 10 min  L3.5 x 10 min  L 3.5 10 mins   Lumbar flexion stretch Seated forward/lateral 3 x 15''  Seated forward/lateral 3 x 15''  Seated forward/lateral 3 x 15''  Seated forward/lateral 3 x 15''  Seated forward/lateral 3 x 15''  Seated w/ stool forward and lateral x5    Lower trunk rotation 5 ea way 5 ea way  5 ea way  2 ea way  2 ea way     Knee to chest 5 x 15'' holds BLE 5 x 15'' holds BLE 5 x 15'' holds BLE  2 x 15 sec holds     Upper thoracic rotation 2 x 15'' holds         Core strengthening TA contraction with isometric hip abduction in hook-lying x 10  Ppt x 15   Ppt w/ supine marching x 10 BLE   TA contraction with isometric hip abduction in hook-lying x 10  Ppt x 15   Ppt w/ supine bridge x 10   Ppt x 15   Ppt w/ supine marching x  10 BLE   TA contraction with isometric hip abduction in hook-lying x 10  PPT x15  PPT x15 w/ ball squeeze and hands to knees     QL stretch      5x L seated  5x L standing w/ door frame   Rhomboid stretch   Arms crossed x 5   Arms crossed x 5    Education    Discussed w/ patient importance of improving overall stride length to increase hip mobility      Hip flexor stretch Standing x 5   Side-lying w/ therapist assistance x 30 sec  Standing x 5   Side-lying w/ therapist assistance x 30 sec  Standing x 5   Side-lying w/ therapist assistance x 30 sec  Standing x 5   Side-lying w/ therapist assistance x 30 sec  Standing x 5       MANUAL THERAPY: (15 minutes):   Joint mobilization, Soft tissue mobilization, and Manipulation was utilized and necessary because of the patient's restricted joint motion, painful spasm, loss of articular motion, and restricted motion of soft  tissue.     STM/MFR to mid-thoracic paraspinals and rhomboids in prone tool-assisted   Iliacus release in supine to bilateral hips NOT DONE TODAY    MODALITIES: (5 min no charge)  Cold pack to LB in sit for analgesic response     Treatment/Session Summary:    Treatment Assessment:  Pt had improved pain levels w/ STM, ice post session, and stretches. Overall, he is very stiff and tight and will benefit from further stretching and strengthening of LEs and trunk.  Communication/Consultation:  None today  Equipment provided today:  None  Recommendations/Intent for next treatment session: Next visit will focus on decreasing tone of mid-thoracic paraspinals and bilateral rhomboids.    Total Treatment Billable Duration:  30 therex minutes, 15 manual therapy mintues  Time In: 0900  Time Out: 0955    Melburn Popper, PT       Charge Capture  }Post Session Pain  PT Visit Info  MedBridge Portal  MD Guidelines  Scanned Media  Benefits  MyChart    Future Appointments   Date Time Provider Department Center   04/13/2021  8:00 AM [name], MD [facility]   04/17/2021  8:45 AM Marcelyn Ditty, PT [facility]   04/19/2021  9:00 AM Marcelyn Ditty, PT [facility]   04/24/2021  8:45 AM Marcelyn Ditty, PT [facility]   04/26/2021  8:45 AM Marcelyn Ditty, PT [facility]   05/01/2021  8:45 AM Marcelyn Ditty, PT [facility]   05/03/2021  8:45 AM Marcelyn Ditty, PT [facility]   05/21/2021  8:30 AM Cameron Proud, MD [facility]   09/06/2021  8:00 AM [facility]   09/06/2021  8:30 AM Hector Shade, MD [facility]

## 2021-04-13 ENCOUNTER — Ambulatory Visit: Admit: 2021-04-13 | Discharge: 2021-04-13 | Payer: MEDICARE | Attending: Family Medicine | Primary: Family Medicine

## 2021-04-13 DIAGNOSIS — I82401 Acute embolism and thrombosis of unspecified deep veins of right lower extremity: Secondary | ICD-10-CM

## 2021-04-13 LAB — AMB POC RAPID INFLUENZA TEST
Influenza A Antigen, POC: NEGATIVE
Influenza B Antigen, POC: NEGATIVE

## 2021-04-13 MED ORDER — COLCHICINE 0.6 MG PO CAPS
0.6 MG | ORAL_CAPSULE | ORAL | 2 refills | Status: DC | PRN
Start: 2021-04-13 — End: 2021-04-26

## 2021-04-13 MED ORDER — AZITHROMYCIN 250 MG PO TABS
250 MG | ORAL_TABLET | ORAL | 1 refills | Status: AC
Start: 2021-04-13 — End: 2021-04-18

## 2021-04-13 NOTE — Progress Notes (Signed)
Chad Rosario  January 24, 1956 is a 66 y.o. male ,Established patient, here for evaluation of the following chief complaint(s):   Chief Complaint   Patient presents with    URI     Congestion,sore throat,ears full started last night- has some patches on his left foot he would like for you to take a look. Pt is fasting    Medication Refill          1. Acute embolism and thrombosis of unspecified deep veins of right lower extremity (HCC)  -     CBC with Auto Differential; Future  -     Comprehensive Metabolic Panel; Future  2. Thrombocytopenia, unspecified (HCC)  3. Chronic gout without tophus, unspecified cause, unspecified site  -     colchicine (MITIGARE) 0.6 MG capsule; Take 1 capsule by mouth as needed for Pain, Disp-90 capsule, R-2Normal  -     Uric Acid; Future  4. Severe obesity (BMI 35.0-39.9) with comorbidity (HCC)-I have reviewed/discussed the above normal BMI with the patient.  I have recommended the following interventions: dietary management education, guidance, and counseling . Marland Kitchen    5. Abnormal glucose  -     Hemoglobin A1C; Future  6. Iron deficiency anemia secondary to inadequate dietary iron intake  -     Iron; Future  -     Ferritin; Future  7. Acute bacterial sinusitis  8. Acute pharyngitis due to other specified organisms  -     azithromycin (ZITHROMAX) 250 MG tablet; Take 1 tablet by mouth See Admin Instructions for 5 days 500mg  on day 1 followed by 250mg  on days 2 - 5, Disp-6 tablet, R-1Normal  9. Cough in adult  -     AMB POC RAPID INFLUENZA TEST--flu test negative        Return in about 6 months (around 10/11/2021).        Subjective   SUBJECTIVE/OBJECTIVE:  URI   This is a new problem. The current episode started yesterday. The problem has been unchanged. There has been no fever. The fever has been present for Less than 1 day. Associated symptoms include congestion, coughing and a sore throat. Pertinent negatives include no abdominal pain, chest pain, dysuria, ear pain, headaches, nausea or rash.    Medication Refill  Associated symptoms include congestion, coughing and a sore throat. Pertinent negatives include no abdominal pain, chest pain, chills, fever, headaches, joint swelling, myalgias, nausea, rash or weakness.   Gout  This is a chronic problem. The current episode started more than 1 year ago. The problem occurs rarely. The problem has been resolved. Associated symptoms include congestion, coughing and a sore throat. Pertinent negatives include no abdominal pain, chest pain, chills, fever, headaches, joint swelling, myalgias, nausea, rash or weakness.   Hypertension  This is a chronic problem. The current episode started more than 1 year ago. The problem is unchanged. The problem is resistant. Pertinent negatives include no anxiety, blurred vision, chest pain, headaches, palpitations, peripheral edema, PND, shortness of breath or sweats.     Review of Systems   Constitutional:  Negative for chills and fever.   HENT:  Positive for congestion and sore throat. Negative for ear pain and hearing loss.    Eyes:  Negative for blurred vision, photophobia and pain.   Respiratory:  Positive for cough. Negative for shortness of breath.    Cardiovascular:  Negative for chest pain, palpitations, leg swelling and PND.   Gastrointestinal:  Negative for abdominal distention, abdominal pain, blood in stool  and nausea.   Genitourinary:  Negative for dysuria and urgency.   Musculoskeletal:  Positive for gout. Negative for joint swelling and myalgias.   Skin:  Negative for color change, pallor and rash.   Neurological:  Negative for speech difficulty, weakness, light-headedness and headaches.   Hematological:  Negative for adenopathy.   Psychiatric/Behavioral:  Negative for agitation, confusion, hallucinations, self-injury and suicidal ideas.         Objective   Physical Exam  Constitutional:       Appearance: Normal appearance.   HENT:      Head: Normocephalic and atraumatic.      Nose: Nose normal.   Eyes:       Extraocular Movements: Extraocular movements intact.      Conjunctiva/sclera: Conjunctivae normal.      Pupils: Pupils are equal, round, and reactive to light.   Cardiovascular:      Rate and Rhythm: Normal rate and regular rhythm.      Pulses: Normal pulses.      Heart sounds: Normal heart sounds.   Pulmonary:      Effort: Pulmonary effort is normal.      Breath sounds: Normal breath sounds.   Abdominal:      General: Abdomen is flat. Bowel sounds are normal.      Palpations: Abdomen is soft.   Skin:     General: Skin is warm and dry.   Neurological:      General: No focal deficit present.      Mental Status: He is alert and oriented to person, place, and time.   Psychiatric:         Mood and Affect: Mood normal.                 An electronic signature was used to authenticate this note.    --[name], MD

## 2021-04-14 LAB — CBC WITH AUTO DIFFERENTIAL
Absolute Eos #: 0.9 10*3/uL — ABNORMAL HIGH (ref 0.0–0.8)
Absolute Immature Granulocyte: 0.1 10*3/uL (ref 0.0–0.5)
Absolute Lymph #: 1.3 10*3/uL (ref 0.5–4.6)
Absolute Mono #: 0.9 10*3/uL (ref 0.1–1.3)
Basophils Absolute: 0.1 10*3/uL (ref 0.0–0.2)
Basophils: 1 % (ref 0.0–2.0)
Eosinophils %: 9 % — ABNORMAL HIGH (ref 0.5–7.8)
Hematocrit: 46.9 % (ref 41.1–50.3)
Hemoglobin: 15.8 g/dL (ref 13.6–17.2)
Immature Granulocytes: 1 % (ref 0.0–5.0)
Lymphocytes: 13 % (ref 13–44)
MCH: 28.9 PG (ref 26.1–32.9)
MCHC: 33.7 g/dL (ref 31.4–35.0)
MCV: 85.9 FL (ref 82–102)
MPV: 13 FL — ABNORMAL HIGH (ref 9.4–12.3)
Monocytes: 10 % (ref 4.0–12.0)
Platelets: 71 10*3/uL — AB (ref 150–450)
RBC: 5.46 M/uL (ref 4.23–5.6)
RDW: 16.3 % — ABNORMAL HIGH (ref 11.9–14.6)
Seg Neutrophils: 66 % (ref 43–78)
Segs Absolute: 6.3 10*3/uL (ref 1.7–8.2)
WBC: 9.6 10*3/uL (ref 4.3–11.1)
nRBC: 0 10*3/uL (ref 0.0–0.2)

## 2021-04-14 LAB — COMPREHENSIVE METABOLIC PANEL
ALT: 41 U/L (ref 12–65)
AST: 30 U/L (ref 15–37)
Albumin/Globulin Ratio: 1.1 (ref 0.4–1.6)
Albumin: 3.9 g/dL (ref 3.2–4.6)
Alk Phosphatase: 62 U/L (ref 50–136)
Anion Gap: 9 mmol/L (ref 2–11)
BUN: 19 MG/DL (ref 8–23)
CO2: 24 mmol/L (ref 21–32)
Calcium: 9.9 MG/DL (ref 8.3–10.4)
Chloride: 106 mmol/L (ref 101–110)
Creatinine: 1.2 MG/DL (ref 0.8–1.5)
Est, Glom Filt Rate: >60 mL/min/{1.73_m2} (ref >60)
Globulin: 3.4 g/dL (ref 2.8–4.5)
Glucose: 111 mg/dL — ABNORMAL HIGH (ref 65–100)
Potassium: 4.6 mmol/L (ref 3.5–5.1)
Sodium: 139 mmol/L (ref 133–143)
Total Bilirubin: 1.2 MG/DL — ABNORMAL HIGH (ref 0.2–1.1)
Total Protein: 7.3 g/dL (ref 6.3–8.2)

## 2021-04-14 LAB — IRON: Iron: 97 ug/dL (ref 35–150)

## 2021-04-14 LAB — URIC ACID: Uric Acid: 5.8 MG/DL (ref 2.6–6.0)

## 2021-04-14 LAB — HEMOGLOBIN A1C
Hemoglobin A1C: 5.1 % (ref 4.8–5.6)
eAG: 100 mg/dL

## 2021-04-14 LAB — FERRITIN: Ferritin: 21 NG/ML (ref 8–388)

## 2021-04-17 ENCOUNTER — Inpatient Hospital Stay: Admit: 2021-04-17 | Payer: MEDICARE | Primary: Family Medicine

## 2021-04-17 NOTE — Progress Notes (Signed)
Chad Rosario  DOB: 05-27-1955  Primary: Medicare Part A And B (Medicare)  Secondary: [facility] MEDICARE SUPP [facility]  [address]  [address]   [place] [address]  Phone: [phone]  Fax: [phone] Plan Frequency: 2 times a week    Plan of Care/Certification Expiration Date: 06/12/21      PT Visit Info:  Plan Frequency: 2 times a week  Plan of Care/Certification Expiration Date: 06/12/21  Total # of Visits to Date: 10     Visit Count:  10   OUTPATIENT PHYSICAL THERAPY:OP NOTE TYPE: Treatment Note 04/17/2021       Episode  }Appt Desk             Treatment Diagnosis:  Low Back Pain (M54.5)  Abnormal posture (R29.3)  Medical/Referring Diagnosis:  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]  History of lumbar fusion [Z98.1]  History of lumbar discectomy [Q49.201]  Referring Physician:  Reuel Boom, MD  MD Orders:  PT Eval and Treat   Date of Onset:  Onset Date: 03/24/20     Allergies:   Hydrocodone-acetaminophen, Losartan, Amlodipine, Rosuvastatin, Atorvastatin, and Ezetimibe  Restrictions/Precautions:  Restrictions/Precautions: None  Required Braces or Orthoses?: No  No data recorded     Interventions Planned (Treatment may consist of any combination of the following):    Current Treatment Recommendations: Strengthening; ROM; Balance training; Functional mobility training; Transfer training; Gait training; Stair training; Neuromuscular re-education; Manual; Modalities; Home exercise program     Subjective Comments:  patient reports that he has been sick this past weekend so he has been doing a lot of sitting which in result has increased his overall low back soreness.  Initial:}    5/10Post Session:       4/10  Medications Last Reviewed:  04/17/2021  Updated Objective Findings:  None Today  Treatment   THERAPEUTIC EXERCISE: (30 minutes):    Exercises per grid below to improve mobility, strength, and balance.  Required moderate visual, verbal, manual, and tactile cues to promote proper body  alignment, promote proper body posture, and promote proper body mechanics.  Progressed resistance, range, and repetitions as indicated.     Date:  03/27/21 Date:   03/29/21 Date:   04/03/21 Date:   04/05/21 Date:  04/10/21 Date:  04/12/21 Date:   04/17/21   Activity/Exercise Parameters Parameters Parameters Parameters  Parameters    Nustep  L3 x 10 min  L2.5 x 10 min  L3 x 10 min  L3 x 10 min  L3.5 x 10 min  L 3.5 10 mins L4 x 8 min    Lumbar flexion stretch Seated forward/lateral 3 x 15''  Seated forward/lateral 3 x 15''  Seated forward/lateral 3 x 15''  Seated forward/lateral 3 x 15''  Seated forward/lateral 3 x 15''  Seated w/ stool forward and lateral x5  Seated forward/lateral 2 x 15 sec holds    Lower trunk rotation 5 ea way 5 ea way  5 ea way  2 ea way  2 ea way   2 ea way    Knee to chest 5 x 15'' holds BLE 5 x 15'' holds BLE 5 x 15'' holds BLE  2 x 15 sec holds   X 30 sec ea LE    Upper thoracic rotation 2 x 15'' holds          Core strengthening TA contraction with isometric hip abduction in hook-lying x 10  Ppt x 15   Ppt w/ supine marching x 10  BLE   TA contraction with isometric hip abduction in hook-lying x 10  Ppt x 15   Ppt w/ supine bridge x 10   Ppt x 15   Ppt w/ supine marching x 10 BLE   TA contraction with isometric hip abduction in hook-lying x 10  PPT x15  PPT x15 w/ ball squeeze and hands to knees   PPT x 15    QL stretch      5x L seated  5x L standing w/ door frame 5x L seated   Rhomboid stretch   Arms crossed x 5   Arms crossed x 5     Education    Discussed w/ patient importance of improving overall stride length to increase hip mobility       Hip flexor stretch Standing x 5   Side-lying w/ therapist assistance x 30 sec  Standing x 5   Side-lying w/ therapist assistance x 30 sec  Standing x 5   Side-lying w/ therapist assistance x 30 sec  Standing x 5   Side-lying w/ therapist assistance x 30 sec  Standing x 5   Standing x 5   Side-lying w/ therapist assistance x 30 sec    Childs pose        Gentle, small range x 15 sec holds      MANUAL THERAPY: (15 minutes):   Joint mobilization, Soft tissue mobilization, and Manipulation was utilized and necessary because of the patient's restricted joint motion, painful spasm, loss of articular motion, and restricted motion of soft tissue.     STM/MFR to mid-thoracic paraspinals and rhomboids in prone tool-assisted   Iliacus release in supine to bilateral hips NOT DONE TODAY  STM/MFR to bilateral QL and L1-L5 paraspinals in prone    MODALITIES: (5 min no charge)  Cold pack to LB in sit for analgesic response     Treatment/Session Summary:    Treatment Assessment:  patient demonstrated improved mobility at end of session when compared to start of session. Was able to get in and out of childs pose position which surprised patient because he hasn't been able to do anything similar to that since surgery.  Communication/Consultation:  None today  Equipment provided today:  None  Recommendations/Intent for next treatment session: Next visit will focus on decreasing tone of mid-thoracic paraspinals and bilateral rhomboids.    Total Treatment Billable Duration:  30 therex minutes, 15 manual therapy mintues  Time In: 0845  Time Out: 0930    Marcelyn Ditty, PT       Charge Capture  }Post Session Pain  PT Visit Info  MedBridge Portal  MD Guidelines  Scanned Media  Benefits  MyChart    Future Appointments   Date Time Provider Department Center   04/19/2021  9:00 AM Marcelyn Ditty, PT [facility]   04/24/2021  8:45 AM Marcelyn Ditty, PT [facility]   04/26/2021  8:45 AM Marcelyn Ditty, PT [facility]   05/01/2021  8:45 AM Marcelyn Ditty, PT [facility]   05/03/2021  8:45 AM Marcelyn Ditty, PT [facility]   05/21/2021  8:30 AM Cameron Proud, MD [facility]   09/06/2021  8:00 AM [facility]   09/06/2021  8:30 AM Hector Shade, MD [facility]

## 2021-04-19 ENCOUNTER — Inpatient Hospital Stay: Admit: 2021-04-19 | Payer: MEDICARE | Primary: Family Medicine

## 2021-04-19 NOTE — Progress Notes (Signed)
Chad Rosario  DOB: 10/07/1955  Primary: Medicare Part A And B (Medicare)  [facility]  [address]  [address]  [place] [id number]  Phone: [phone]  Fax: [phone] Plan Frequency: 2 times a week    Plan of Care/Certification Expiration Date: 06/12/21      PT Visit Info:  Plan Frequency: 2 times a week  Plan of Care/Certification Expiration Date: 06/12/21  Total # of Visits to Date: 11     Visit Count:  11   OUTPATIENT PHYSICAL THERAPY:OP NOTE TYPE: Treatment Note 04/19/2021       Episode  }Appt Desk             Treatment Diagnosis:  Low Back Pain (M54.5)  Abnormal posture (R29.3)  Medical/Referring Diagnosis:  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]  History of lumbar fusion [Z98.1]  History of lumbar discectomy [I69.629][Z98.890]  Referring Physician:  Reuel Rosario, Adam, MD  MD Orders:  PT Eval and Treat   Date of Onset:  Onset Date: 03/24/20     Allergies:   Hydrocodone-acetaminophen, Losartan, Amlodipine, Rosuvastatin, Atorvastatin, and Ezetimibe  Restrictions/Precautions:  Restrictions/Precautions: None  Required Braces or Orthoses?: No  No data recorded     Interventions Planned (Treatment may consist of any combination of the following):    Current Treatment Recommendations: Strengthening; ROM; Balance training; Functional mobility training; Transfer training; Gait training; Stair training; Neuromuscular re-education; Manual; Modalities; Home exercise program     Subjective Comments:  reports that he definitely feels like he has been improving, states that getting out of bed is much easier as well as changing positions.  Initial:}    2/10Post Session:       2/10  Medications Last Reviewed:  04/19/2021  Updated Objective Findings:  None Today  Treatment   THERAPEUTIC EXERCISE: (35 minutes):    Exercises per grid below to improve mobility, strength, and balance.  Required moderate visual, verbal, manual, and tactile cues to promote proper body alignment, promote  proper body posture, and promote proper body mechanics.  Progressed resistance, range, and repetitions as indicated.     Date:  03/27/21 Date:   03/29/21 Date:   04/03/21 Date:   04/05/21 Date:  04/10/21 Date:  04/12/21 Date:   04/17/21 Date:  04/19/21   Activity/Exercise Parameters Parameters Parameters Parameters  Parameters     Nustep  L3 x 10 min  L2.5 x 10 min  L3 x 10 min  L3 x 10 min  L3.5 x 10 min  L 3.5 10 mins L4 x 8 min  L4 x 8 min    Lumbar flexion stretch Seated forward/lateral 3 x 15''  Seated forward/lateral 3 x 15''  Seated forward/lateral 3 x 15''  Seated forward/lateral 3 x 15''  Seated forward/lateral 3 x 15''  Seated w/ stool forward and lateral x5  Seated forward/lateral 2 x 15 sec holds  Seated forward/lateral 2 x 15 sec holds    Lower trunk rotation 5 ea way 5 ea way  5 ea way  2 ea way  2 ea way   2 ea way  2 ea way    Knee to chest 5 x 15'' holds BLE 5 x 15'' holds BLE 5 x 15'' holds BLE  2 x 15 sec holds   X 30 sec ea LE  X 30 sec ea LE   Upper thoracic rotation 2 x 15'' holds           Core strengthening  TA contraction with isometric hip abduction in hook-lying x 10  Ppt x 15   Ppt w/ supine marching x 10 BLE   TA contraction with isometric hip abduction in hook-lying x 10  Ppt x 15   Ppt w/ supine bridge x 10   Ppt x 15   Ppt w/ supine marching x 10 BLE   TA contraction with isometric hip abduction in hook-lying x 10  PPT x15  PPT x15 w/ ball squeeze and hands to knees   PPT x 15  Seated PPT x 15   Seated ppt x 10 w/ marching, progressed to bilateral marching    QL stretch      5x L seated  5x L standing w/ door frame 5x L seated    Rhomboid stretch   Arms crossed x 5   Arms crossed x 5      Education    Discussed w/ patient importance of improving overall stride length to increase hip mobility        Hip flexor stretch Standing x 5   Side-lying w/ therapist assistance x 30 sec  Standing x 5   Side-lying w/ therapist assistance x 30 sec  Standing x 5   Side-lying w/ therapist assistance x 30 sec   Standing x 5   Side-lying w/ therapist assistance x 30 sec  Standing x 5   Standing x 5   Side-lying w/ therapist assistance x 30 sec  Standing x 5   Side-lying w/ therapist assistance x 30 sec    Childs pose       Gentle, small range x 15 sec holds  Gentle, small range x 15 sec holds    Quadruped position         Upper thoracic rotation x 15 sec      MANUAL THERAPY: (10 minutes):   Joint mobilization, Soft tissue mobilization, and Manipulation was utilized and necessary because of the patient's restricted joint motion, painful spasm, loss of articular motion, and restricted motion of soft tissue.     STM/MFR to mid-thoracic paraspinals and rhomboids in prone tool-assisted   Iliacus release in supine to bilateral hips NOT DONE TODAY  STM/MFR to bilateral QL and L1-L5 paraspinals in prone    MODALITIES: (5 min no charge)  Cold pack to LB in sit for analgesic response     Treatment/Session Summary:    Treatment Assessment:  Chad Rosario did well able to increase his overall hip mobility especially in quadruped position.  Communication/Consultation:  None today  Equipment provided today:  None  Recommendations/Intent for next treatment session: Next visit will focus on decreasing tone of mid-thoracic paraspinals and bilateral rhomboids.    Total Treatment Billable Duration:  35 therex minutes, 10 manual therapy mintues  Time In: 0900  Time Out: 0945    Chad Rosario, PT       Charge Capture  }Post Session Pain  PT Visit Info  MedBridge Portal  MD Guidelines  Scanned Media  Benefits  MyChart    Future Appointments   Date Time Provider Department Center   04/24/2021  8:45 AM Chad Rosario, PT [facility]   04/26/2021  8:45 AM Chad Rosario, PT [facility]   05/01/2021  8:45 AM Chad Rosario, PT [facility]   05/03/2021  8:45 AM Chad Rosario, PT [facility]   05/21/2021  8:30 AM Cameron Proud, MD [facility]   09/06/2021  8:00 AM [facility]URANCE [facility]   09/06/2021  8:30 AM Hector Shade, MD [facility]

## 2021-04-23 ENCOUNTER — Encounter

## 2021-04-23 NOTE — Telephone Encounter (Signed)
NEEDS VIRTUAL OR IN OFFICE VISIT FOR EVALUATION

## 2021-04-24 ENCOUNTER — Inpatient Hospital Stay: Admit: 2021-04-24 | Payer: MEDICARE | Primary: Family Medicine

## 2021-04-24 NOTE — Progress Notes (Signed)
Chad Rosario  DOB: 1955-08-18  Primary: Medicare Part A And B (Medicare)  Secondary: [facility] MEDICARE SUPP [employer]  [address]  [address]  [place] [address]  Phone: [phone]  Fax: [phone] Plan Frequency: 2 times a week    Plan of Care/Certification Expiration Date: 06/12/21      PT Visit Info:  Plan Frequency: 2 times a week  Plan of Care/Certification Expiration Date: 06/12/21  Total # of Visits to Date: 12     Visit Count:  12   OUTPATIENT PHYSICAL THERAPY:OP NOTE TYPE: Treatment Note 04/24/2021       Episode  }Appt Desk             Treatment Diagnosis:  Low Back Pain (M54.5)  Abnormal posture (R29.3)  Medical/Referring Diagnosis:  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]  History of lumbar fusion [Z98.1]  History of lumbar discectomy [D40.814]  Referring Physician:  Reuel Boom, MD  MD Orders:  PT Eval and Treat   Date of Onset:  Onset Date: 03/24/20     Allergies:   Hydrocodone-acetaminophen, Losartan, Amlodipine, Rosuvastatin, Atorvastatin, and Ezetimibe  Restrictions/Precautions:  Restrictions/Precautions: None  Required Braces or Orthoses?: No  No data recorded     Interventions Planned (Treatment may consist of any combination of the following):    Current Treatment Recommendations: Strengthening; ROM; Balance training; Functional mobility training; Transfer training; Gait training; Stair training; Neuromuscular re-education; Manual; Modalities; Home exercise program     Subjective Comments:  patient reports that he went on a walk for the first time in a while yesterday and that he has increased soreness following the walk.  Initial:}    2/10Post Session:       2/10  Medications Last Reviewed:  04/24/2021  Updated Objective Findings:  None Today  Treatment   THERAPEUTIC EXERCISE: (35 minutes):    Exercises per grid below to improve mobility, strength, and balance.  Required moderate visual, verbal, manual, and tactile cues to promote proper body alignment, promote proper  body posture, and promote proper body mechanics.  Progressed resistance, range, and repetitions as indicated.     Date:   04/03/21 Date:   04/05/21 Date:  04/10/21 Date:  04/12/21 Date:   04/17/21 Date:  04/19/21 Date:   04/24/21   Activity/Exercise Parameters Parameters  Parameters      Nustep  L3 x 10 min  L3 x 10 min  L3.5 x 10 min  L 3.5 10 mins L4 x 8 min  L4 x 8 min  L4 x 10 min   Lumbar flexion stretch Seated forward/lateral 3 x 15''  Seated forward/lateral 3 x 15''  Seated forward/lateral 3 x 15''  Seated w/ stool forward and lateral x5  Seated forward/lateral 2 x 15 sec holds  Seated forward/lateral 2 x 15 sec holds  Seated forward/lateral 2 x 15 sec holds    Lower trunk rotation 5 ea way  2 ea way  2 ea way   2 ea way  2 ea way  2 ea way    Knee to chest 5 x 15'' holds BLE  2 x 15 sec holds   X 30 sec ea LE  X 30 sec ea LE X 30 sec ea LE    Upper thoracic rotation          Core strengthening Ppt x 15   Ppt w/ supine bridge x 10   Ppt x 15   Ppt w/ supine marching x 10 BLE  TA contraction with isometric hip abduction in hook-lying x 10  PPT x15  PPT x15 w/ ball squeeze and hands to knees   PPT x 15  Seated PPT x 15   Seated ppt x 10 w/ marching, progressed to bilateral marching  Ppt x 15    QL stretch    5x L seated  5x L standing w/ door frame 5x L seated     Rhomboid stretch Arms crossed x 5   Arms crossed x 5       Education  Discussed w/ patient importance of improving overall stride length to increase hip mobility         Hip flexor stretch Standing x 5   Side-lying w/ therapist assistance x 30 sec  Standing x 5   Side-lying w/ therapist assistance x 30 sec  Standing x 5   Standing x 5   Side-lying w/ therapist assistance x 30 sec  Standing x 5   Side-lying w/ therapist assistance x 30 sec  Side-lying w/ therapist assistance x 30 sec    Childs pose     Gentle, small range x 15 sec holds  Gentle, small range x 15 sec holds  Gentle, small range x 15 sec holds    Quadruped position       Upper thoracic rotation  x 15 sec       MANUAL THERAPY: (10 minutes):   Joint mobilization, Soft tissue mobilization, and Manipulation was utilized and necessary because of the patient's restricted joint motion, painful spasm, loss of articular motion, and restricted motion of soft tissue.     STM/MFR to mid-thoracic paraspinals and rhomboids in prone tool-assisted   Iliacus release in supine to bilateral hips NOT DONE TODAY  STM/MFR to bilateral QL and L1-L5 paraspinals in prone    MODALITIES: (5 min no charge)  Cold pack to LB in sit for analgesic response     Treatment/Session Summary:    Treatment Assessment:  progress report for [name] was completed today, patient has improved both in a subjective and objective manner.  Communication/Consultation:  None today  Equipment provided today:  None  Recommendations/Intent for next treatment session: Next visit will focus on decreasing tone of mid-thoracic paraspinals and bilateral rhomboids.    Total Treatment Billable Duration:  35 therex minutes, 10 manual therapy mintues  Time In: 0845  Time Out: 0930    Marcelyn Ditty, PT       Charge Capture  }Post Session Pain  PT Visit Info  MedBridge Portal  MD Guidelines  Scanned Media  Benefits  MyChart    Future Appointments   Date Time Provider Department Center   05/01/2021  8:45 AM Marcelyn Ditty, PT [facility]   05/03/2021  8:45 AM Marcelyn Ditty, PT [facility]   05/21/2021  8:30 AM Cameron Proud, MD [facility]   09/06/2021  8:00 AM [facility]   09/06/2021  8:30 AM Hector Shade, MD [facility]

## 2021-04-24 NOTE — Progress Notes (Signed)
Chad Rosario  DOB: 17-Jun-1955  Primary: Medicare Part A And B (Medicare)  Secondary: [place] [facility] [employer]  [address]  [place] [address]  Phone: [phone]  Fax: [phone] Plan Frequency: 2 times a week  Plan of Care/Certification Expiration Date: 06/12/21      PT Visit Info:    Plan Frequency: 2 times a week  Plan of Care/Certification Expiration Date: 06/12/21  Total # of Visits to Date: 12      Visit Count:  12                OUTPATIENT PHYSICAL THERAPY:             OP NOTE TYPE: Progress Report 04/24/2021               Episode  Appt Desk         Treatment Diagnosis:  Low Back Pain (M54.5)  Abnormal posture (R29.3)  Medical/Referring Diagnosis:  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]  History of lumbar fusion [Z98.1]  History of lumbar discectomy [W40.973]  Referring Physician:  Lurene Shadow, MD  MD Orders:  PT Eval and Treat   Return MD Appt:  NA  Date of Onset:  Onset Date: 03/24/20      Allergies:  Hydrocodone-acetaminophen, Losartan, Amlodipine, Rosuvastatin, Atorvastatin, and Ezetimibe  Restrictions/Precautions:    Restrictions/Precautions: None  Required Braces or Orthoses?: No      Medications Last Reviewed:  04/24/2021     SUBJECTIVE   History of Injury/Illness (Reason for Referral):     Patient has had a complicated history of multiple lumbar spine surgeries including one in 2003, 2015, and most recently in December 2021.  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  Patient reports having significant pain in between his shoulder blades as well as below them.  He reports that the pain doesn't neccesarily affect his sleep however he does feel increased stiffness and pain whenever he transferring from a static position that he held for a prolonged period of time.      Initial:     2/10 Post Session:     2/10  Past Medical  History/Comorbidities:   Chad Rosario  has a past medical history of CAD (coronary artery disease), Hypertension, Neuropathy, and Sleep apnea.  Chad Rosario  has a past surgical history that includes Cholecystectomy; Cardiac surgery; Total hip arthroplasty (Left); eye surgery (Left); fracture surgery (Left); back surgery (2021); and Colonoscopy (N/A, 11/29/2020).  Social History/Living Environment:   Lives With: Spouse  Type of Home: Condo  Home Layout: Two level     Prior Level of Function/Work/Activity:   Prior level of function: Independent  Prior level of function: Independent  Current level of function: Independent  Occupation: Retired           Immunologist:   Does the patient/guardian have any barriers to learning?: No barriers  Will there be a co-learner?: No  What is the preferred language of the patient/guardian?: English  Is an interpreter required?: No  How does the patient/guardian prefer to learn new concepts?: Listening; Reading; Demonstration; Pictures/Videos     Fall Risk Scale:   Morse Total Score: 0  Morse Fall Risk: Low (0-24)     Dominant Side:  right handed      OBJECTIVE   Observation/Orthostatic Postural Assessment:    Demonstrates increased rounded  shoulders w/ forward head posture.  Increased kyphosis in mid-thoracic spine as well as significant tone present in scapular musculature.    Palpation:    Significant tenderness to palpation bilateral rhomboids, thoracic/lumbar paraspinals and throughout the medial border of the scapulae.   Gait:   Demonstrates a decreased stride length with wide BOS as well as no heel strike and flexed trunk posture.    Special tests:  - SLR test      Joint/Muscle ROM Strength Updates 04/24/21   Lumbar flexion Limited with significant pain   able to reach to suprapatellar region with discomfort   Lumbar extension Limited with significant pain      Hip flexion 100 degrees on RLE, 105 degrees on LLE 4/5 BLE   110 deg BLE,  4+/5 BLE   Knee extension WNL  4/5 BLE   4+/5    Knee flexion WNL 4/5 BLE   4+/5   Hip abduction Not assessed today   4/5 BLE    Hip extension Not assessed today.                    Lumbar Quadrant Test   Movement  Updates: 04/24/21   Forward flexion/right side bend Increased pain on L side, compressive sensation Mild discomfort with stretch    Forward flexion/left side bend Pain w/ slight stretch stretch   Extension/right side bend Unable due to pain aggravating   Extension/left side bend Unable due to pain aggravating          ASSESSMENT   Initial Assessment:  Chad Rosario presents to PT with c/o of chronic mid-thoracic/lumbar pain that has been persistent since his last fusion surgery performed on December 31st 2021.    Pt demonstrated significant tenderness and tightness to palpation to the bilateral rhomboids, thoracic/lumbar paraspinals and throughout the medial border of the scapulae.   With testing, pt displayed decreased core strength, overall mobility, and poor scapular activation due to the increased tone seen in his mid-thoracic spine. Additionally, he scored a 14/50 on the modified Oswestry. Chad Rosario will benefit from continued skilled PT services to improve deficits listed below and to progress towards his PLOF.     Updated Assessment:     As of 04/24/21, Chad Rosario has attended 12 scheduled visits, with 1 cancellation and 0 no shows.  Since starting PT, patient has shown improvement with overall movement especially while completing transfers. He is currently demonstrating decreased tenderness/tightness to bilateral rhomboids, thoracic/lumbar paraspinals, as well the medial border of the scapulae.   Additonally patient's modified oswestry score improved to 10/50 and he has met 5/8 PT goals.  Pt will benefit from continued skilled PT services to improve deficits and meet the rest of her goals.    Problem List: (Impacting functional limitations):    Body Structures, Functions, Activity Limitations Requiring Skilled Therapeutic Intervention:  Decreased functional mobility ; Decreased ADL status; Decreased ROM; Decreased body mechanics; Decreased tolerance to work activity; Decreased strength; Decreased endurance; Decreased posture; Increased pain     Therapy Prognosis:   Therapy Prognosis: Good     Initial Assessment Complexity:   Decision Making: Medium Complexity    PLAN   Effective Dates: 03/14/21 TO Plan of Care/Certification Expiration Date: 06/12/21  07/25/21    Frequency/Duration: Plan Frequency: 2 times a week     Interventions Planned (Treatment may consist of any combination of the following):    Current Treatment Recommendations: Strengthening; ROM; Balance training; Functional mobility training; Transfer training; Gait training;  Stair training; Neuromuscular re-education; Manual; Modalities; Home exercise program     GOALS: (Goals have been discussed and agreed upon with patient.)  Short Term Goals 4 weeks   Eon Zunker will be independent with HEP to promote self-management of symptoms. MET  Issak Goley will participate in LE strengthening program with weights as appropriate to help with gait and elevations. MET  Derren Suydam will participate in static and dynamic balance activities to decrease the risk for falls and improve overall QOL. MET  Jeter Tomey will tolerate manual therapy/joint mobilizations/soft tissue to increase ROM and decrease pain to improve functional mobility during ADLs. MET    Discharge Goals 12 weeks  Jamason Peckham will demonstrate an 10 point improvement on the Oswestry to show improvement in function. ONGOING  Thuan Tippett will demonstrate >=4+/5 LE strength on manual muscle testing to improve functional mobility. ONGOING  Tayten Bergdoll will be able to perform SLS >10 seconds bilaterally to help with gait and improve balance. ONGOING  Byran Bilotti will be able to demonstrate safe lifting and transfer mechanics without cueing for improved safety with home, childcare, and community activities. MET           Outcome Measure:   Tool Used: Modified Oswestry Low Back Pain Questionnaire  Score:  Initial: 14/50  Most Recent: 10/50 (Date: 04/24/21)   Interpretation of Score: Each section is scored on a 0-5 scale, 5 representing the greatest disability.  The scores of each section are added together for a total score of 50.      Medical Necessity:   > Patient is expected to demonstrate progress in strength, range of motion, and balance to increase independence with ADLs.  > Skilled intervention continues to be required due to impairments listed above.  Reason For Services/Other Comments:  > Patient continues to demonstrate capacity to improve rom, strength, overall mobility, and activity tolerance which will increase independence.  > Patient continues to require skilled intervention due to impairments listed above.  Total Duration:  Time In: 0845  Time Out: 0930    Regarding [place] therapy, I certify that the treatment plan above will be carried out by a therapist or under their direction.  Thank you for this referral,  Joneen Caraway, PT     Referring Physician Signature: Chad Shadow, MD No Signature is Required for this note.        Post Session Pain  Charge Capture  PT Visit Info MD Guidelines  MyChart

## 2021-04-26 ENCOUNTER — Ambulatory Visit: Admit: 2021-04-26 | Discharge: 2021-04-26 | Payer: MEDICARE | Attending: Medical | Primary: Family Medicine

## 2021-04-26 ENCOUNTER — Inpatient Hospital Stay: Admit: 2021-04-26 | Payer: MEDICARE | Primary: Family Medicine

## 2021-04-26 DIAGNOSIS — B9689 Other specified bacterial agents as the cause of diseases classified elsewhere: Secondary | ICD-10-CM

## 2021-04-26 DIAGNOSIS — Z981 Arthrodesis status: Secondary | ICD-10-CM

## 2021-04-26 DIAGNOSIS — M1A9XX0 Chronic gout, unspecified, without tophus (tophi): Secondary | ICD-10-CM

## 2021-04-26 MED ORDER — PREDNISONE 10 MG PO TABS
10 MG | ORAL_TABLET | ORAL | 0 refills | Status: DC
Start: 2021-04-26 — End: 2021-07-20

## 2021-04-26 MED ORDER — AMOXICILLIN-POT CLAVULANATE 875-125 MG PO TABS
875-125 MG | ORAL_TABLET | Freq: Two times a day (BID) | ORAL | 0 refills | Status: AC
Start: 2021-04-26 — End: 2021-05-06

## 2021-04-26 MED ORDER — COLCHICINE 0.6 MG PO CAPS
0.6 | ORAL_CAPSULE | ORAL | 3 refills | Status: DC | PRN
Start: 2021-04-26 — End: 2024-03-20

## 2021-04-26 NOTE — Progress Notes (Signed)
Chad Rosario  DOB: 30-Jun-1955  Primary: Medicare Part A And B (Medicare)  Secondary: [facility] MEDICARE SUPP [facility] [employer]  [address]  [address]  [place] [address]  Phone: [phone]  Fax: [phone] Plan Frequency: 2 times a week    Plan of Care/Certification Expiration Date: 06/12/21      PT Visit Info:  Plan Frequency: 2 times a week  Plan of Care/Certification Expiration Date: 06/12/21  Total # of Visits to Date: 13     Visit Count:  13   OUTPATIENT PHYSICAL THERAPY:OP NOTE TYPE: Treatment Note 04/26/2021       Episode  }Appt Desk             Treatment Diagnosis:  Low Back Pain (M54.5)  Abnormal posture (R29.3)  Medical/Referring Diagnosis:  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]  History of lumbar fusion [Z98.1]  History of lumbar discectomy [H06.237]  Referring Physician:  Reuel Boom, MD  MD Orders:  PT Eval and Treat   Date of Onset:  Onset Date: 03/24/20     Allergies:   Hydrocodone-acetaminophen, Losartan, Amlodipine, Rosuvastatin, Atorvastatin, and Ezetimibe  Restrictions/Precautions:  Restrictions/Precautions: None  Required Braces or Orthoses?: No  No data recorded     Interventions Planned (Treatment may consist of any combination of the following):    Current Treatment Recommendations: Strengthening; ROM; Balance training; Functional mobility training; Transfer training; Gait training; Stair training; Neuromuscular re-education; Manual; Modalities; Home exercise program     Subjective Comments:  patient reports that he has increased soreness in his lower back today, states that he can't recall the reason to why his lower back has been so sore recently.  Initial:}    5/10Post Session:       4/10  Medications Last Reviewed:  04/26/2021  Updated Objective Findings:  None Today  Treatment   THERAPEUTIC EXERCISE: (35 minutes):    Exercises per grid below to improve mobility, strength, and balance.  Required moderate visual, verbal, manual, and tactile cues to promote proper body  alignment, promote proper body posture, and promote proper body mechanics.  Progressed resistance, range, and repetitions as indicated.     Date:   04/03/21 Date:   04/05/21 Date:  04/10/21 Date:  04/12/21 Date:   04/17/21 Date:  04/19/21 Date:   04/24/21 Date:   04/26/21   Activity/Exercise Parameters Parameters  Parameters       Nustep  L3 x 10 min  L3 x 10 min  L3.5 x 10 min  L 3.5 10 mins L4 x 8 min  L4 x 8 min  L4 x 10 min L4 x 8 min    Lumbar flexion stretch Seated forward/lateral 3 x 15''  Seated forward/lateral 3 x 15''  Seated forward/lateral 3 x 15''  Seated w/ stool forward and lateral x5  Seated forward/lateral 2 x 15 sec holds  Seated forward/lateral 2 x 15 sec holds  Seated forward/lateral 2 x 15 sec holds     Lower trunk rotation 5 ea way  2 ea way  2 ea way   2 ea way  2 ea way  2 ea way  2 ea way    Knee to chest 5 x 15'' holds BLE  2 x 15 sec holds   X 30 sec ea LE  X 30 sec ea LE X 30 sec ea LE  X 30 sec ea LE    Upper thoracic rotation           Core  strengthening Ppt x 15   Ppt w/ supine bridge x 10   Ppt x 15   Ppt w/ supine marching x 10 BLE   TA contraction with isometric hip abduction in hook-lying x 10  PPT x15  PPT x15 w/ ball squeeze and hands to knees   PPT x 15  Seated PPT x 15   Seated ppt x 10 w/ marching, progressed to bilateral marching  Ppt x 15  Ppt x 15    QL stretch    5x L seated  5x L standing w/ door frame 5x L seated   X 5 L seated    Rhomboid stretch Arms crossed x 5   Arms crossed x 5        Education  Discussed w/ patient importance of improving overall stride length to increase hip mobility          Hip flexor stretch Standing x 5   Side-lying w/ therapist assistance x 30 sec  Standing x 5   Side-lying w/ therapist assistance x 30 sec  Standing x 5   Standing x 5   Side-lying w/ therapist assistance x 30 sec  Standing x 5   Side-lying w/ therapist assistance x 30 sec  Side-lying w/ therapist assistance x 30 sec  Side-lying w/ therapist assistance x 30 sec    Childs pose      Gentle, small range x 15 sec holds  Gentle, small range x 15 sec holds  Gentle, small range x 15 sec holds  Gentle, small range x 15 sec holds    Quadruped position       Upper thoracic rotation x 15 sec   Upper thoracic rotation x 15 sec      MANUAL THERAPY: (10 minutes):   Joint mobilization, Soft tissue mobilization, and Manipulation was utilized and necessary because of the patient's restricted joint motion, painful spasm, loss of articular motion, and restricted motion of soft tissue.     STM/MFR to mid-thoracic paraspinals and rhomboids in prone tool-assisted   Iliacus release in supine to bilateral hips NOT DONE TODAY  STM/MFR to bilateral QL and L1-L5 paraspinals in prone    MODALITIES: (5 min no charge)  Cold pack to LB in sit for analgesic response     Treatment/Session Summary:    Treatment Assessment:  Chad Rosario did well today, demonstrated improved mobility getting into and out of quadruped position to allow stretching of lower back.  Communication/Consultation:  None today  Equipment provided today:  None  Recommendations/Intent for next treatment session: Next visit will focus on decreasing tone of mid-thoracic paraspinals and bilateral rhomboids.    Total Treatment Billable Duration:  35 therex minutes, 10 manual therapy mintues  Time In: 0845  Time Out: 0930    Marcelyn Ditty, PT       Charge Capture  }Post Session Pain  PT Visit Info  MedBridge Portal  MD Guidelines  Scanned Media  Benefits  MyChart    Future Appointments   Date Time Provider Department Center   05/01/2021  8:45 AM Marcelyn Ditty, PT [facility]   05/03/2021  8:45 AM Marcelyn Ditty, PT [facility]   05/21/2021  8:30 AM Cameron Proud, MD [facility]   09/06/2021  8:00 AM [facility]   09/06/2021  8:30 AM Hector Shade, MD [facility]

## 2021-04-26 NOTE — Progress Notes (Signed)
[facility]  [address]  [place] [id number]  Phone [phone]  Fax [phone]  Milford CageKarla Reizel Calzada PA    Patient: Chad Boxeraul  Rosario  Date of Birth: 07/16/1955  Patient Age 66 y.o.  Patient sex: male  MEDICAL RECORD NUMBER [id number]  Visit Date:04/26/2021   Author:  Paula ComptonKarla L. Fermin SchwabLester, PA-C    Family Practice Clinic Note     Chief complaint  Chad Boxeraul  Miyasato  is a 66 y.o. male who was seen on 04/29/21  4:16 PM  for the following reasons:    Chief Complaint   Patient presents with    Congestion     Since 1/20--took ABX with no improvement    Ear Fullness       Current Medical problems address  Sinusitis --reated with zpack for sinus infections and it hasn't resolved and still having ear fullness and nasal congestion  and not resolving.  No associated fever or chills    Gout has not had any flairs but needs to put     ASSESSMENT AND PLAN    ICD-10-CM    1. Acute bacterial sinusitis  J01.90     B96.89       2. Chronic gout without tophus, unspecified cause, unspecified site  [id number] colchicine (MITIGARE) 0.6 MG capsule         Renae Fickleaul was seen today for congestion and ear fullness.    Diagnoses and all orders for this visit:    Acute bacterial sinusitis    Chronic gout without tophus, unspecified cause, unspecified site  -     colchicine (MITIGARE) 0.6 MG capsule; Take 1 capsule by mouth as needed for Pain    Other orders  -     amoxicillin-clavulanate (AUGMENTIN) 875-125 MG per tablet; Take 1 tablet by mouth 2 times daily for 10 days  -     predniSONE (DELTASONE) 10 MG tablet; Take 3 tablets daily x 2 days then 2 tablets daily x 2 days then 1 tablet daily x 2 days    Plan includes the following:  Plan  Advised to start antibiotics and return if not improving in 5-7 days or sooner if symptoms worsen.     No follow-up provider specified.  No orders of the defined types were placed in this encounter.      Past Medical History:  Allergies:  Allergies   Allergen Reactions    Hydrocodone-Acetaminophen Anxiety and Itching     Losartan Shortness Of Breath    Amlodipine Other (See Comments)     Other reaction(s): Other (See Comments)  Fatigue   Fatigue       Rosuvastatin Other (See Comments)     Other reaction(s): Constipation  Fatigue, constipation  Other reaction(s): Fatigue    Atorvastatin Diarrhea    Ezetimibe      Other reaction(s): Constipation, Other (See Comments)  Constipation and decreased urine output  Constipation and decreased urine output  Other reaction(s): Constipation, Urine output low         Current Medications:   Medications marked "taking" at this time:  Current Outpatient Medications   Medication Sig Dispense Refill    colchicine (MITIGARE) 0.6 MG capsule Take 1 capsule by mouth as needed for Pain 30 capsule 3    amoxicillin-clavulanate (AUGMENTIN) 875-125 MG per tablet Take 1 tablet by mouth 2 times daily for 10 days 20 tablet 0    predniSONE (DELTASONE) 10 MG tablet Take 3 tablets daily x 2 days then 2 tablets  daily x 2 days then 1 tablet daily x 2 days 12 tablet 0    Acetaminophen 500 MG CAPS Take 1,000 mg by mouth as needed      allopurinol (ZYLOPRIM) 300 MG tablet TAKE 1 TABLET BY MOUTH  DAILY      Alpha-Lipoic Acid 200 MG CAPS Take 300 mg by mouth      amitriptyline (ELAVIL) 10 MG tablet nightly as needed      aspirin 81 MG EC tablet Take 81 mg by mouth in the morning.      azelastine (ASTELIN) 0.1 % nasal spray 1 spray by Nasal route as needed      fexofenadine (ALLEGRA) 180 MG tablet Take 180 mg by mouth in the morning.      gabapentin (NEURONTIN) 300 MG capsule Take 300 capsules by mouth in the morning, at noon, and at bedtime. Take 2 capsules 3 times a day      lisinopril (PRINIVIL;ZESTRIL) 20 MG tablet Take by mouth daily      rivaroxaban (XARELTO) 10 MG TABS tablet Take 10 mg by mouth in the morning.      zolpidem (AMBIEN) 10 MG tablet TAKE 1 TABLET BY MOUTH  DAILY AS NEEDED      doxycycline hyclate (VIBRA-TABS) 100 MG tablet Take 1 tablet by mouth 2 times daily for 7 days 14 tablet 0     No current  facility-administered medications for this visit.        Current Problem List:   Patient Active Problem List   Diagnosis    Obesity (BMI 30-39.9)    History of DVT (deep vein thrombosis)    Hx of CABG    Hypertension    Hyperlipidemia    Chronic dyspnea    Thrombocytopenia, unspecified    Acute embolism and thrombosis of unspecified deep veins of right lower extremity (HCC)       Social History:   Social History     Tobacco Use    Smoking status: Never    Smokeless tobacco: Never   Substance Use Topics    Alcohol use: Yes     Alcohol/week: 12.0 standard drinks     Types: 6 Cans of beer, 6 Shots of liquor per week     Comment: socially       Family History:   Family History   Problem Relation Age of Onset    Cancer Mother         Lymp    Stroke Father     Cancer Sister         Breast       Surgical History:  Past Surgical History:   Procedure Laterality Date    BACK SURGERY  2021    has had 3 different ones last was in 2021    CARDIAC SURGERY      CABG    CHOLECYSTECTOMY      COLONOSCOPY N/A 11/29/2020    COLONOSCOPY POLYPECTOMY HOT SNARE performed by Drucie Opitz, MD at [facility] ENDOSCOPY    EYE SURGERY Left     cataract    FRACTURE SURGERY Left     tib-fib fracture    TOTAL HIP ARTHROPLASTY Left        ROS  Review of Systems   Constitutional:  Negative for fever.   HENT:  Positive for rhinorrhea, sinus pressure and sinus pain.    Eyes:  Negative for visual disturbance.   Respiratory:  Negative for cough and shortness of breath.  Cardiovascular:  Negative for chest pain.   Gastrointestinal:  Negative for blood in stool.   Musculoskeletal:  Negative for myalgias.   Skin:  Negative for rash.   Neurological:  Negative for syncope and weakness.   Psychiatric/Behavioral:  Negative for dysphoric mood.      BP (!) 140/80    Pulse 58    Temp 98.1 ??F (36.7 ??C)    Wt 286 lb 6.4 oz (129.9 kg)    SpO2 98%    BMI 37.79 kg/m??   Body mass index is 37.79 kg/m??.     Physical Exam    Physical Exam  Vitals and nursing note reviewed.    Constitutional:       Appearance: Normal appearance.   Eyes:      Conjunctiva/sclera: Conjunctivae normal.   Cardiovascular:      Rate and Rhythm: Normal rate and regular rhythm.   Pulmonary:      Effort: Pulmonary effort is normal.      Breath sounds: Normal breath sounds.   Musculoskeletal:      Cervical back: Neck supple.      Right lower leg: No edema.      Left lower leg: No edema.   Neurological:      Mental Status: He is alert.   Psychiatric:         Mood and Affect: Mood normal.        I have reviewed the patient's past medical history, social history and family history and vitals.  We have discussed treatment plan and follow up and given patient instructions.  Patient's questions are answered and we will follow up as indicated.  Return in about 3 months (around 07/24/2021).     Jhony Antrim L. Konrad Dolores, PA-C  4:16 PM  04/29/2021

## 2021-04-27 MED ORDER — DOXYCYCLINE HYCLATE 100 MG PO TABS
100 MG | ORAL_TABLET | Freq: Two times a day (BID) | ORAL | 0 refills | Status: AC
Start: 2021-04-27 — End: 2021-05-04

## 2021-04-27 NOTE — Telephone Encounter (Signed)
From: [name]  To: Lulu Riding  Sent: 04/27/2021 6:12 AM EST  Subject: Antibiotic    Hi, the antibiotic you prescribed really upset my stomach. Nausea and pain. Please send in another antibiotic to the [facility] in [place]. (Not amoxicillin)    If you have any questions please call me at [phone]     Thank you and have a great weekend.     Baxter Hire

## 2021-05-01 ENCOUNTER — Inpatient Hospital Stay: Admit: 2021-05-01 | Payer: MEDICARE | Primary: Family Medicine

## 2021-05-01 NOTE — Progress Notes (Signed)
Chad Rosario  DOB: 11/22/1955  Primary: Medicare Part A And B (Medicare)  Secondary: [facility] MEDICARE SUPP [employer]  [address]  [address]  [place] [address]  Phone: [phone]  Fax: [phone] Plan Frequency: 2 times a week    Plan of Care/Certification Expiration Date: 06/12/21      PT Visit Info:  Plan Frequency: 2 times a week  Plan of Care/Certification Expiration Date: 06/12/21  Total # of Visits to Date: 14     Visit Count:  14   OUTPATIENT PHYSICAL THERAPY:OP NOTE TYPE: Treatment Note 05/01/2021       Episode  }Appt Desk             Treatment Diagnosis:  Low Back Pain (M54.5)  Abnormal posture (R29.3)  Medical/Referring Diagnosis:  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]  History of lumbar fusion [Z98.1]  History of lumbar discectomy [J62.836]  Referring Physician:  Reuel Boom, MD  MD Orders:  PT Eval and Treat   Date of Onset:  Onset Date: 03/24/20     Allergies:   Hydrocodone-acetaminophen, Losartan, Amlodipine, Rosuvastatin, Atorvastatin, and Ezetimibe  Restrictions/Precautions:  Restrictions/Precautions: None  Required Braces or Orthoses?: No  No data recorded     Interventions Planned (Treatment may consist of any combination of the following):    Current Treatment Recommendations: Strengthening; ROM; Balance training; Functional mobility training; Transfer training; Gait training; Stair training; Neuromuscular re-education; Manual; Modalities; Home exercise program     Subjective Comments:  mr. Askin reports that he is doing good, states that he feels like he might have strained a muscle in his left lower back either at the job or exerting himself at therapy.  Initial:}    4/10Post Session:       3/10  Medications Last Reviewed:  05/01/2021  Updated Objective Findings:  None Today  Treatment   THERAPEUTIC EXERCISE: (35 minutes):    Exercises per grid below to improve mobility, strength, and balance.  Required moderate visual, verbal, manual, and tactile cues to promote  proper body alignment, promote proper body posture, and promote proper body mechanics.  Progressed resistance, range, and repetitions as indicated.     Date:  04/10/21 Date:  04/12/21 Date:   04/17/21 Date:  04/19/21 Date:   04/24/21 Date:   04/26/21 Date:   05/01/21   Activity/Exercise  Parameters        Nustep  L3.5 x 10 min  L 3.5 10 mins L4 x 8 min  L4 x 8 min  L4 x 10 min L4 x 8 min  L4 x 8 min    Lumbar flexion stretch Seated forward/lateral 3 x 15''  Seated w/ stool forward and lateral x5  Seated forward/lateral 2 x 15 sec holds  Seated forward/lateral 2 x 15 sec holds  Seated forward/lateral 2 x 15 sec holds   Seated forward/lateral 2 x 15 sec holds    Lower trunk rotation 2 ea way   2 ea way  2 ea way  2 ea way  2 ea way  2 ea way    Knee to chest 2 x 15 sec holds   X 30 sec ea LE  X 30 sec ea LE X 30 sec ea LE  X 30 sec ea LE  X 30 sec ea LE    Upper thoracic rotation          Core strengthening Ppt x 15   Ppt w/ supine marching x 10 BLE  TA contraction with isometric hip abduction in hook-lying x 10  PPT x15  PPT x15 w/ ball squeeze and hands to knees   PPT x 15  Seated PPT x 15   Seated ppt x 10 w/ marching, progressed to bilateral marching  Ppt x 15  Ppt x 15  Seated PPT x 15    QL stretch  5x L seated  5x L standing w/ door frame 5x L seated   X 5 L seated  X 5 L seated    Rhomboid stretch Arms crossed x 5         Education          Hip flexor stretch Standing x 5   Standing x 5   Side-lying w/ therapist assistance x 30 sec  Standing x 5   Side-lying w/ therapist assistance x 30 sec  Side-lying w/ therapist assistance x 30 sec  Side-lying w/ therapist assistance x 30 sec     Childs pose   Gentle, small range x 15 sec holds  Gentle, small range x 15 sec holds  Gentle, small range x 15 sec holds  Gentle, small range x 15 sec holds  Gentle, small range x 15 sec holds    Quadruped position     Upper thoracic rotation x 15 sec   Upper thoracic rotation x 15 sec  Upper thoracic rotation x 15 sec      MANUAL THERAPY:  (10 minutes):   Joint mobilization, Soft tissue mobilization, and Manipulation was utilized and necessary because of the patient's restricted joint motion, painful spasm, loss of articular motion, and restricted motion of soft tissue.     STM/MFR to mid-thoracic paraspinals and rhomboids in prone tool-assisted   Iliacus release in supine to bilateral hips NOT DONE TODAY  STM/MFR to bilateral QL and L1-L5 paraspinals in prone    MODALITIES: (5 min no charge)  Cold pack to LB in sit for analgesic response     Treatment/Session Summary:    Treatment Assessment:  patient did well throughout session, didn't report any increase in pain throughout session.  Demonstrating improved efficiency with quadruped position as well as performing a sit to stand.  Communication/Consultation:  None today  Equipment provided today:  None  Recommendations/Intent for next treatment session: Next visit will focus on decreasing tone of mid-thoracic paraspinals and bilateral rhomboids.    Total Treatment Billable Duration:  35 therex minutes, 10 manual therapy mintues  Time In: 0845  Time Out: 0930    Marcelyn Ditty, PT       Charge Capture  }Post Session Pain  PT Visit Info  MedBridge Portal  MD Guidelines  Scanned Media  Benefits  MyChart    Future Appointments   Date Time Provider Department Center   05/03/2021  8:45 AM Marcelyn Ditty, PT [facility]   05/18/2021 10:15 AM Marcelyn Ditty, PT [facility]   05/21/2021  8:30 AM Cameron Proud, MD [facility]   09/06/2021  8:00 AM [facility]   09/06/2021  8:30 AM Hector Shade, MD [facility]

## 2021-05-02 ENCOUNTER — Encounter

## 2021-05-02 ENCOUNTER — Inpatient Hospital Stay: Admit: 2021-05-02 | Payer: MEDICARE | Primary: Family Medicine

## 2021-05-02 ENCOUNTER — Telehealth

## 2021-05-02 DIAGNOSIS — Z86718 Personal history of other venous thrombosis and embolism: Secondary | ICD-10-CM

## 2021-05-02 MED ORDER — RIVAROXABAN 20 MG PO TABS
20 MG | ORAL_TABLET | Freq: Every day | ORAL | 1 refills | Status: DC
Start: 2021-05-02 — End: 2021-07-19

## 2021-05-02 NOTE — Telephone Encounter (Addendum)
Pt notified of [place] results and recommendation to increase Xarelto to full 20mg  dose.   Pt verbalized understanding.  Pt would still like to see vein specialists regarding pain in leg - referral entered for [facility]. Msg sent to scheduler [facility] to fax.     ----- Message from Angelica Chessman, MD sent at 05/02/2021  1:22 PM EST -----  Increase xarelto to 20 mg PO daily (full dose) as 06/30/2021 shows RLE DVT.

## 2021-05-02 NOTE — Addendum Note (Signed)
Addended by: Orbie Pyo A on: 05/02/2021 08:55 AM     Modules accepted: Orders

## 2021-05-02 NOTE — Telephone Encounter (Signed)
Message received that [place] department had been trying to reach the office in regards to a result of a doppler study but couldn't get through. Chart reviewed and noted report was in Epic. Called back to [place] anyway to let them know we got the message. I have notified Dr. Karie Mainland that the report was in the chart. He will review.

## 2021-05-03 ENCOUNTER — Inpatient Hospital Stay: Admit: 2021-05-03 | Payer: MEDICARE | Primary: Family Medicine

## 2021-05-03 ENCOUNTER — Encounter

## 2021-05-03 NOTE — Telephone Encounter (Signed)
[facility] office called in regards to a referral we sent them/states they do not treat acute DVT/wanted to know if there was something else the PT was being treated for that warranted the referral/

## 2021-05-03 NOTE — Progress Notes (Signed)
Chad Rosario  DOB: 02/19/1956  Primary: Medicare Part A [place] B (Medicare)  Secondary: [facility] MEDICARE SUPP [employer]  [address]  [address]  [place] [address]  Phone: [phone]  Fax: [phone] Plan Frequency: 2 times a week    Plan of Care/Certification Expiration Date: 06/12/21      PT Visit Info:  Plan Frequency: 2 times a week  Plan of Care/Certification Expiration Date: 06/12/21  Total # of Visits to Date: 15     Visit Count:  15   OUTPATIENT PHYSICAL THERAPY:OP NOTE TYPE: Treatment Note 05/03/2021       Episode  }Appt Desk             Treatment Diagnosis:  Low Back Pain (M54.5)  Abnormal posture (R29.3)  Medical/Referring Diagnosis:  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]  History of lumbar fusion [Z98.1]  History of lumbar discectomy [Z00.923]  Referring Physician:  Reuel Boom, MD  MD Orders:  PT Eval and Treat   Date of Onset:  Onset Date: 03/24/20     Allergies:   Hydrocodone-acetaminophen, Losartan, Amlodipine, Rosuvastatin, Atorvastatin, and Ezetimibe  Restrictions/Precautions:  Restrictions/Precautions: None  Required Braces or Orthoses?: No  No data recorded     Interventions Planned (Treatment may consist of any combination of the following):    Current Treatment Recommendations: Strengthening; ROM; Balance training; Functional mobility training; Transfer training; Gait training; Stair training; Neuromuscular re-education; Manual; Modalities; Home exercise program     Subjective Comments:  [name] reports that he doesn't have as much soreness as he did the previous session.  Initial:}    2/10Post Session:       1/10  Medications Last Reviewed:  05/03/2021  Updated Objective Findings:  None Today  Treatment   THERAPEUTIC EXERCISE: (35 minutes):    Exercises per grid below to improve mobility, strength, and balance.  Required moderate visual, verbal, manual, and tactile cues to promote proper body alignment, promote proper body posture, and promote proper body  mechanics.  Progressed resistance, range, and repetitions as indicated.     Date:  04/10/21 Date:  04/12/21 Date:   04/17/21 Date:  04/19/21 Date:   04/24/21 Date:   04/26/21 Date:   05/01/21 Date:   05/03/21   Activity/Exercise  Parameters         Nustep  L3.5 x 10 min  L 3.5 10 mins L4 x 8 min  L4 x 8 min  L4 x 10 min L4 x 8 min  L4 x 8 min  L3.5 x 8 min   Lumbar flexion stretch Seated forward/lateral 3 x 15''  Seated w/ stool forward and lateral x5  Seated forward/lateral 2 x 15 sec holds  Seated forward/lateral 2 x 15 sec holds  Seated forward/lateral 2 x 15 sec holds   Seated forward/lateral 2 x 15 sec holds  Seated forward/lateral 2 x 15 sec holds   Lower trunk rotation 2 ea way   2 ea way  2 ea way  2 ea way  2 ea way  2 ea way  2 ea way    Knee to chest 2 x 15 sec holds   X 30 sec ea LE  X 30 sec ea LE X 30 sec ea LE  X 30 sec ea LE  X 30 sec ea LE     Upper thoracic rotation           Core strengthening Ppt x 15   Ppt w/ supine marching  x 10 BLE   TA contraction with isometric hip abduction in hook-lying x 10  PPT x15  PPT x15 w/ ball squeeze and hands to knees   PPT x 15  Seated PPT x 15   Seated ppt x 10 w/ marching, progressed to bilateral marching  Ppt x 15  Ppt x 15  Seated PPT x 15  Seated PPT x 15   Seated PPT w/ marching x 10   Holding 12 lbs med ball performing PNF flexion pattern w/ core activation x 10    QL stretch  5x L seated  5x L standing w/ door frame 5x L seated   X 5 L seated  X 5 L seated  X 5 L seated    Rhomboid stretch Arms crossed x 5          Education           Hip flexor stretch Standing x 5   Standing x 5   Side-lying w/ therapist assistance x 30 sec  Standing x 5   Side-lying w/ therapist assistance x 30 sec  Side-lying w/ therapist assistance x 30 sec  Side-lying w/ therapist assistance x 30 sec   Standing x 5    Childs pose   Gentle, small range x 15 sec holds  Gentle, small range x 15 sec holds  Gentle, small range x 15 sec holds  Gentle, small range x 15 sec holds  Gentle, small range  x 15 sec holds     Quadruped position     Upper thoracic rotation x 15 sec   Upper thoracic rotation x 15 sec  Upper thoracic rotation x 15 sec     Cable walkouts         W/ 7 lbs x 8 ea way      MANUAL THERAPY: (10 minutes):   Joint mobilization, Soft tissue mobilization, and Manipulation was utilized and necessary because of the patient's restricted joint motion, painful spasm, loss of articular motion, and restricted motion of soft tissue.     STM/MFR to mid-thoracic paraspinals and rhomboids in prone tool-assisted   Iliacus release in supine to bilateral hips NOT DONE TODAY  STM/MFR to bilateral QL and L1-L5 paraspinals in prone    MODALITIES: (5 min no charge)  Cold pack to LB in sit for analgesic response     Treatment/Session Summary:    Treatment Assessment:  [name] did well throughout todays session, demonstrated great core activation while performing diagonal movements in a seated position with med ball.  Communication/Consultation:  None today  Equipment provided today:  None  Recommendations/Intent for next treatment session: Next visit will focus on decreasing tone of mid-thoracic paraspinals and bilateral rhomboids.    Total Treatment Billable Duration:  35 therex minutes, 10 manual therapy mintues  Time In: 0845  Time Out: 0930    Marcelyn Ditty, PT       Charge Capture  }Post Session Pain  PT Visit Info  MedBridge Portal  MD Guidelines  Scanned Media  Benefits  MyChart    Future Appointments   Date Time Provider Department Center   05/18/2021 10:15 AM Marcelyn Ditty, PT [facility]   05/21/2021  8:30 AM Cameron Proud, MD [facility]   09/06/2021  8:00 AM [facility] INSURANCE [facility]   09/06/2021  8:30 AM Hector Shade, MD [facility]

## 2021-05-04 ENCOUNTER — Encounter

## 2021-05-17 ENCOUNTER — Encounter

## 2021-05-18 NOTE — Progress Notes (Signed)
Chad Rosario  DOB: 07-23-1955  Primary: Medicare Part A And B  Secondary: [employer] MEDICARE SUPP [facility]  [address]  [address]  [place] [address]  Phone: [phone]  Fax: [phone]    PT Visit Info:    Total # of Visits to Date: 15     OT Visit Info:  No data recorded    OUTPATIENT THERAPY:OP NOTE TYPE: Progress Report 05/18/2021               Episode  Appt Desk           Chad Rosario cancelled his appointment for today due to  feeling better and no longer needing therapy .  Will plan to follow up next during next appointment.  Thank you,  Marcelyn Ditty, PT    Future Appointments   Date Time Provider Department Center   05/18/2021  7:00 PM Marcelyn Ditty, PT [facility]   05/21/2021  8:30 AM Cameron Proud, MD [facility]   06/07/2021  8:00 AM [facility]   09/06/2021  8:00 AM [facility]   09/06/2021  8:30 AM Hector Shade, MD [facility]

## 2021-05-19 ENCOUNTER — Inpatient Hospital Stay: Payer: MEDICARE | Primary: Family Medicine

## 2021-05-21 ENCOUNTER — Ambulatory Visit: Admit: 2021-05-21 | Discharge: 2021-05-21 | Payer: MEDICARE | Attending: Neurology | Primary: Family Medicine

## 2021-05-21 DIAGNOSIS — R269 Unspecified abnormalities of gait and mobility: Secondary | ICD-10-CM

## 2021-05-21 NOTE — Progress Notes (Deleted)
.  bt

## 2021-05-21 NOTE — Progress Notes (Signed)
05/21/2021  Chad BoxerPaul Rosario     Patient is referred by the following provider for consultation regarding as below:    History of polyneuropathy.  Possibly idiopathic.  Several other disorders as we discussed with him at length today.    Dear      [name], [name]                                    NEUROLOGY     CONSULTATION                   Chief Complaint:  Chief Complaint   Patient presents with    New Patient     Neuropathy, sleep apnea       Unaccompanied::        66 year old married right-handed white male with many problems.  He is sent for gait disorder weakness of his legs and feet bilateral foot drop polyneuropathy.  He was seen over the years and finally was [facility] neuropathy evalu[name].    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    He notes that his dad actually had weakness onset his last 2 years of life in his 7170s mid 5570s to late 2770s of severe weakness at his feet to the point of needing a wheelchair.  His parents are succumbed by this time and there is no other family history of such.  Apparently his dad had pes planus.    Patient states that he has more of a pes cavus.  Idiopathic polyneuropathy = = with stumbling and falling he does have a placard for his car he does have a cane that he did not bring with him as he comes unaccompanied today.  He notes that he always has to have the lights on at night has a moderate degree to a severe degree of gait disturbance and falling but no incontinence.                     right handed 66 y.o.    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      * I reviewed the available and pertinent records - including eHR and Care Everywhere - notes of PMHx, PSHx, Fam Hx, and  and have  examined patient with the following findings: As reviewed.      IMAGING REVIEW:  I REVIEWED PERTINENT  IMAGES AND REPORTS WITH THE PATIENT PERSONALLY, DIRECTLY AND FULLY.  25 extra  MINUTES.     Past Medical History:  Past Medical History:   Diagnosis Date    CAD (coronary artery disease) 2015    Hypertension     controlled with meds    Neuropathy May 2019    Sleep apnea     cpap at night   OSA -- not eval     Past Surgical History:  Past Surgical History:   Procedure Laterality Date    BACK SURGERY  2021    has had 3 different ones last was in 2021    CARDIAC SURGERY      CABG    CHOLECYSTECTOMY      COLONOSCOPY N/A 11/29/2020    COLONOSCOPY POLYPECTOMY HOT SNARE performed by Drucie Opitzhody Fawaz, MD at  [facility]    EYE SURGERY Left     cataract    FRACTURE SURGERY Left     tib-fib fracture    TOTAL HIP ARTHROPLASTY Left    CABG 2015...  Foot drop R  2007 p fx fib-tib and fusion 2017      Social History:  Social History     Socioeconomic History    Marital status: Married     Spouse name: Not on file    Number of children: Not on file    Years of education: Not on file    Highest education level: Not on file   Occupational History    Not on file   Tobacco Use    Smoking status: Never    Smokeless tobacco: Never   Vaping Use    Vaping Use: Never used   Substance and Sexual Activity    Alcohol use: Yes     Alcohol/week: 12.0 standard drinks     Types: 6 Cans of beer, 6 Shots of liquor per week     Comment: socially    Drug use: Never    Sexual activity: Not Currently     Partners: Female   Other Topics Concern    Not on file   Social History Narrative    Not on file     Social Determinants of Health     Financial Resource Strain: Low Risk     Difficulty of Paying Living Expenses: Not hard at all   Food Insecurity: No Food Insecurity    Worried About Programme researcher, broadcasting/film/video in the Last Year: Never true    Ran Out of Food in the Last Year: Never true   Transportation Needs: Unknown    Lack of Transportation (Medical): Not on file     Lack of Transportation (Non-Medical): No   Physical Activity: Not on file   Stress: Not on file   Social Connections: Not on file   Intimate Partner Violence: Not on file   Housing Stability: Unknown    Unable to Pay for Housing in the Last Year: Not on file    Number of Places Lived in the Last Year: Not on file    Unstable Housing in the Last Year: No       Family History:   Family History   Problem Relation Age of Onset    Cancer Mother         Lymp    Stroke Father     Cancer Sister         Breast       Medications:      Current Outpatient Medications:     rivaroxaban (XARELTO) 20 MG TABS tablet, Take 1 tablet by mouth daily (with breakfast), Disp: 90 tablet, Rfl: 1    Acetaminophen 500 MG CAPS, Take 1,000 mg by mouth as needed, Disp: , Rfl:     allopurinol (ZYLOPRIM) 300 MG tablet, TAKE 1 TABLET BY MOUTH  DAILY, Disp: , Rfl:     Alpha-Lipoic Acid 200 MG CAPS, Take 300 mg by mouth, Disp: , Rfl:     amitriptyline (ELAVIL) 10 MG tablet, nightly as needed, Disp: , Rfl:     aspirin 81 MG EC tablet, Take 81 mg by mouth in the morning., Disp: , Rfl:     azelastine (ASTELIN) 0.1 % nasal spray, 1 spray by Nasal route as needed, Disp: , Rfl:     fexofenadine (ALLEGRA) 180 MG tablet, Take 180 mg by mouth  in the morning., Disp: , Rfl:     gabapentin (NEURONTIN) 300 MG capsule, Take 300 capsules by mouth in the morning, at noon, and at bedtime. Take 2 capsules 3 times a day, Disp: , Rfl:     lisinopril (PRINIVIL;ZESTRIL) 20 MG tablet, Take by mouth daily, Disp: , Rfl:     zolpidem (AMBIEN) 10 MG tablet, TAKE 1 TABLET BY MOUTH  DAILY AS NEEDED, Disp: , Rfl:     ELIQUIS 5 MG TABS tablet, Starting in a week, Disp: , Rfl:     colchicine (MITIGARE) 0.6 MG capsule, Take 1 capsule by mouth as needed for Pain (Patient not taking: Reported on 05/21/2021), Disp: 30 capsule, Rfl: 3    predniSONE (DELTASONE) 10 MG tablet, Take 3 tablets daily x 2 days then 2 tablets daily x 2 days then 1 tablet daily x 2 days (Patient not taking:  Reported on 05/21/2021), Disp: 12 tablet, Rfl: 0      Allergies   Allergen Reactions    Hydrocodone-Acetaminophen Anxiety and Itching    Losartan Shortness Of Breath    Amlodipine Other (See Comments)     Other reaction(s): Other (See Comments)  Fatigue   Fatigue       Rosuvastatin Other (See Comments)     Other reaction(s): Constipation  Fatigue, constipation  Other reaction(s): Fatigue    Atorvastatin Diarrhea    Ezetimibe      Other reaction(s): Constipation, Other (See Comments)  Constipation and decreased urine output  Constipation and decreased urine output  Other reaction(s): Constipation, Urine output low         Review of Systems:  Review of Systems   Constitutional: Negative.    HENT:  Positive for tinnitus (always). Negative for hearing loss.    Eyes:  Negative for blurred vision, double vision and photophobia.        Patient notes that he has strabismus and amblyopia right eye.   Respiratory:          He has not seen anybody for his OSA since about 2005.  Still remains on CPAP.  I am not sure how it is working since he has not had it reviewed.    He has cardiac illness CAD and is followed by Dr. Freddi Starr.   Cardiovascular:         S/p CABG CAD and vascular problems.  He has a vascular surgeon he says wanted him to get on Eliquis which is 3 times more expensive per month then Xarelto.  (We discussed the ins and outs and why this would be the best choice.)   Gastrointestinal:  Negative for constipation.   Musculoskeletal:  Positive for back pain, falls (balance) and joint pain. Negative for myalgias and neck pain.        Back fusion 14 months = which increases leg problems.  He also had a marked injury to his left tibia the fibula with compartment syndrome many years ago from an injury.   Neurological:  Positive for dizziness (Gait imbalance.  Wide-based.  Etc. etc. with history of falling.), tingling, sensory change, focal weakness and weakness (legs). Negative for tremors, speech change, seizures, loss of  consciousness and headaches.   Psychiatric/Behavioral:  Negative for depression, hallucinations, memory loss, substance abuse and suicidal ideas. The patient is not nervous/anxious and does not have insomnia.         He states that he takes the amitriptyline a very low dose for helping him sleep.   All other systems  reviewed and are negative.       Extended / Orthostatic Vitals:    Vitals:    05/21/21 0830   BP: 130/72   Pulse: 61   SpO2: 98%   Weight: 285 lb 9.6 oz (129.5 kg)        Physical Exam  Vitals reviewed.   Constitutional:       General: He is awake. He is not in acute distress.     Appearance: He is well-developed and well-groomed. He is obese. He is not ill-appearing, toxic-appearing or diaphoretic.   HENT:      Head: Normocephalic and atraumatic. No raccoon eyes, abrasion, contusion, right periorbital erythema, left periorbital erythema or laceration.      Right Ear: Hearing normal.      Left Ear: Hearing normal.   Eyes:      General: Lids are normal. Vision grossly intact. No visual field deficit or scleral icterus.     Extraocular Movements:      Right eye: Abnormal extraocular motion and nystagmus present.      Left eye: Abnormal extraocular motion and nystagmus present.      Conjunctiva/sclera: Conjunctivae normal.      Right eye: Right conjunctiva is not injected.      Left eye: Left conjunctiva is not injected.      Pupils: Pupils are equal, round, and reactive to light.      Comments: Decreased acuity right eye.  Strabismus right.  Mallampati score appears to be 2.   Neck:      Trachea: Phonation normal.   Pulmonary:      Effort: Pulmonary effort is normal. No respiratory distress.      Breath sounds: No wheezing.   Musculoskeletal:         General: Deformity (Bilateral foot drop but is well left mid tibial area with old injury etc.) and signs of injury (Chronic/old and recovered.) present.      Cervical back: Normal range of motion. No signs of trauma, rigidity or torticollis. Normal range of  motion.      Right lower leg: No edema.      Left lower leg: No edema.   Skin:     General: Skin is warm and dry.      Capillary Refill: Capillary refill takes less than 2 seconds.      Coloration: Skin is not cyanotic, jaundiced or pale.      Nails: There is no clubbing.   Neurological:      Mental Status: He is alert and easily aroused. Mental status is at baseline.      Cranial Nerves: No cranial nerve deficit, dysarthria or facial asymmetry.      Sensory: Sensory deficit present.      Motor: Weakness and abnormal muscle tone (Generally decreased.) present. No tremor, atrophy, seizure activity or pronator drift.      Coordination: Coordination abnormal. Romberg Test abnormal.      Gait: Gait abnormal and tandem walk abnormal.      Deep Tendon Reflexes: Reflexes abnormal.      Reflex Scores:       Bicep reflexes are 1+ on the right side and 1+ on the left side.       Brachioradialis reflexes are 1+ on the right side and 1+ on the left side.       Patellar reflexes are 0 on the right side and 0 on the left side.       Achilles reflexes are 0 on  the right side and 0 on the left side.     Comments: Left carpal tunnel release scar.   Psychiatric:         Attention and Perception: Attention normal.         Mood and Affect: Mood normal.         Speech: Speech normal.         Behavior: Behavior normal. Behavior is cooperative.         Cognition and Memory: Cognition and memory normal.        Neurologic Exam     Mental Status   Attention: normal. Concentration: normal.   Speech: speech is normal   Level of consciousness: alert  Knowledge: good and consistent with education. Able to perform simple calculations.   Normal comprehension.     Cranial Nerves     CN III, IV, VI   Pupils are equal, round, and reactive to light.    Gait, Coordination, and Reflexes     Gait  Gait: wide-based (Very wide base with obvious stumbling and he has to hold on just to walk.  He does not have a cane with him today.)    Coordination    Romberg: positive  Tandem walking coordination: abnormal    Tremor   Resting tremor: absent  Intention tremor: absent  Action tremor: absent    Reflexes   Right brachioradialis: 1+  Left brachioradialis: 1+  Right biceps: 1+  Left biceps: 1+  Right patellar: 0  Left patellar: 0  Right achilles: 0  Left achilles: 0  Right Hoffman: absent  Left Hoffman: absent  Right ankle clonus: absent  Left ankle clonus: absentBilateral foot drop.  No Horner syndrome.  PERRLA.  No Hoffmann.  No Gerstmann.  No Lhermitte sign.  No Spurling sign.  No abnormal movements.    No cerebellar ataxia.  There is sensory ataxia bilateral with loss of pinprick and vibration in the lower extremities below the knees.      There is no tic, twitch, tonic or clonic activity noted.         No dyskinesia.        Assessment   Assessment / Plan:    Chad Rosario was seen today for new patient.    Diagnoses and all orders for this visit:    Gait disorder  -     Ambulatory referral to Physical Therapy    Polyneuropathy    Fall, sequela    Postural imbalance    Encounter for medication management  1.  He had a previous work-up last year by [name] with final impression of polyneuropathy-probably idiopathic.  2.  He has some worsening as would be likely expected.  3.  Diagnostic challenges would include whether or not there is actually any hereditary possibility superimposed.  4.  He is already taking alpha lipoic acid and gabapentin treatments.  Both of these seem to be optimized.  5.  We did a discussion about the benefits more of Xarelto versus Eliquis.  Defer to vascular and cardiology.  6.  He needs a cane wherever he goes as well as a flashlight.  His risk is high.  7.  It has been 15 or more years since he has been evaluated for his OSA/CPAP and certainly I would get another evaluation of this so I will defer that over to his PCP but I will go ahead and put in a referral to sleep apnea people.  Sleep center.  8.  Certainly with a very high  BMI of 37.68 nutrition etc. and weight loss program/goal without seeing specially since this increases gait problem etc.    Refer for gait evaluation / PT and any training that might be helpful.    The Diagnosis and differential diagnostic considerations, and Rx Tx were reviewed with the patient at length.         Idiopathic/possibly with a component of inherited neuropathy but either way it seems to be progressive a bit.  We discussed the differential at length.        I have spent greater than 50% of visit discussing and counseling of patient 71+ min visit for treatment and diagnostic plan review.           More than 50% of this visit  time was spent in counseling and care coordination.               The above time includes pre-  and post- face-face time in records review, and preparation including available pertinent images and reports.      Notes: Patient is to continue all medications as directed by prescribing physicians. Continuations on today's visit are made based on the patient's report of current medications.   Patient acknowledges the above examination and reviews.          Current Meds Verified: Current meds/immunizations reviewed, including purpose with pt. Med Recon list given to pt/family. Pt advised to discard old med lists and provide all providers with current list at each visit and carry list with them in case of emergency.        [ *NOTE:  parts or all of this consultation are produced using artificial voice recognition software.  Some speech errors are inherent in such software and may be included in the produced record. ]                Cameron Proud MD  Consultative Neurology, Neurodiagnostics   [facility]    [address],   [address]  [place], [place] [address]  Phone:  [phone]  Fax:   [phone]

## 2021-05-23 LAB — BASIC METABOLIC PANEL
Anion Gap: 7 mmol/L (ref 2–11)
BUN: 21 MG/DL (ref 8–23)
CO2: 21 mmol/L (ref 21–32)
Calcium: 9 MG/DL (ref 8.3–10.4)
Chloride: 109 mmol/L (ref 101–110)
Creatinine: 1.1 MG/DL (ref 0.8–1.5)
Est, Glom Filt Rate: >60 mL/min/{1.73_m2} (ref >60)
Glucose: 101 mg/dL — ABNORMAL HIGH (ref 65–100)
Potassium: 4.5 mmol/L (ref 3.5–5.1)
Sodium: 137 mmol/L (ref 133–143)

## 2021-05-23 LAB — CBC
Hematocrit: 46.6 % (ref 41.1–50.3)
Hemoglobin: 16 g/dL (ref 13.6–17.2)
MCH: 29.4 PG (ref 26.1–32.9)
MCHC: 34.3 g/dL (ref 31.4–35.0)
MCV: 85.5 FL (ref 82–102)
MPV: UNDETERMINED FL (ref 9.4–12.3)
Platelets: 88 10*3/uL — ABNORMAL LOW (ref 150–450)
RBC: 5.45 M/uL (ref 4.23–5.6)
RDW: 16.7 % — ABNORMAL HIGH (ref 11.9–14.6)
WBC: 7.6 10*3/uL (ref 4.3–11.1)
nRBC: 0 10*3/uL (ref 0.0–0.2)

## 2021-05-23 LAB — PROTIME-INR
INR: 1.1
Protime: 14.3 s (ref 12.6–14.3)

## 2021-05-28 ENCOUNTER — Encounter

## 2021-05-28 NOTE — Telephone Encounter (Signed)
From: [name]  To: Dr. Reuel Boom  Sent: 05/28/2021 1:15 PM EST  Subject: Back    I think I broke some hardware in my back fusion. Bent over and heard a loud pop. Having some discomfort now. I would like to get it checked out and see Dr Tania Ade. Would need X-ray. Please advise. Thank you.

## 2021-05-30 ENCOUNTER — Telehealth

## 2021-05-30 ENCOUNTER — Inpatient Hospital Stay: Admit: 2021-05-30 | Payer: MEDICARE | Primary: Family Medicine

## 2021-05-30 DIAGNOSIS — Z981 Arthrodesis status: Secondary | ICD-10-CM

## 2021-05-30 NOTE — Telephone Encounter (Signed)
Patient called back- aware of xrays results and screws are backing out of top of lumbar spine construct- patient wishes referral to Dr. Merleen Nicely- only recent films -T/L on PACS from 03-07

## 2021-05-30 NOTE — Telephone Encounter (Signed)
Vm left at 1:32 for patient to call- per Dr. Tania Ade- screws are backing out of top of construct- will need a scoliosis expert Dr. Merleen Nicely or Dr. Luisa Hart- or perhaps surgeons in [facility]

## 2021-05-30 NOTE — Telephone Encounter (Signed)
URGENT Referral to Dr. Ardyth Gal [facility] [phone]

## 2021-06-04 NOTE — Telephone Encounter (Signed)
Pt is scheduled to see Dr. Merleen Nicely on 3.22.2023 at 2pm.

## 2021-06-07 ENCOUNTER — Inpatient Hospital Stay: Admit: 2021-06-07 | Payer: MEDICARE | Attending: Anesthesiology | Primary: Family Medicine

## 2021-06-07 DIAGNOSIS — I82401 Acute embolism and thrombosis of unspecified deep veins of right lower extremity: Secondary | ICD-10-CM

## 2021-06-07 MED ORDER — HALOPERIDOL LACTATE 5 MG/ML IJ SOLN
5 MG/ML | Freq: Once | INTRAMUSCULAR | Status: AC | PRN
Start: 2021-06-07 — End: 2021-06-08

## 2021-06-07 MED ORDER — IOPAMIDOL 61 % IV SOLN
61 % | INTRAVENOUS | Status: AC
Start: 2021-06-07 — End: ?

## 2021-06-07 MED ORDER — SUCCINYLCHOLINE CHLORIDE 20 MG/ML IJ SOLN
20 MG/ML | INTRAMUSCULAR | Status: DC | PRN
Start: 2021-06-07 — End: 2021-06-07
  Administered 2021-06-07: 13:00:00 200 via INTRAVENOUS

## 2021-06-07 MED ORDER — LIDOCAINE HCL 1 % IJ SOLN
1 % | Freq: Once | INTRAMUSCULAR | Status: AC | PRN
Start: 2021-06-07 — End: 2021-06-08

## 2021-06-07 MED ORDER — CEFAZOLIN SODIUM 1 G IJ SOLR
1 g | INTRAMUSCULAR | Status: DC | PRN
Start: 2021-06-07 — End: 2021-06-07
  Administered 2021-06-07: 13:00:00 3 via INTRAVENOUS

## 2021-06-07 MED ORDER — GLUCOSE 4 G PO CHEW
4 g | ORAL | Status: DC | PRN
Start: 2021-06-07 — End: 2021-06-11

## 2021-06-07 MED ORDER — HEPARIN SODIUM (PORCINE) 1000 UNIT/ML IJ SOLN
1000 UNIT/ML | INTRAMUSCULAR | Status: DC | PRN
Start: 2021-06-07 — End: 2021-06-07
  Administered 2021-06-07: 14:00:00 5000 via INTRAVENOUS
  Administered 2021-06-07: 15:00:00 3000 via INTRAVENOUS

## 2021-06-07 MED ORDER — NORMAL SALINE FLUSH 0.9 % IV SOLN
0.9 % | Freq: Two times a day (BID) | INTRAVENOUS | Status: DC
Start: 2021-06-07 — End: 2021-06-11

## 2021-06-07 MED ORDER — NORMAL SALINE FLUSH 0.9 % IV SOLN
0.9 % | INTRAVENOUS | Status: DC | PRN
Start: 2021-06-07 — End: 2021-06-11

## 2021-06-07 MED ORDER — DEXTROSE 10 % IV BOLUS
INTRAVENOUS | Status: DC | PRN
Start: 2021-06-07 — End: 2021-06-11

## 2021-06-07 MED ORDER — GLUCAGON HCL RDNA (DIAGNOSTIC) 1 MG IJ SOLR
1 MG | INTRAMUSCULAR | Status: DC | PRN
Start: 2021-06-07 — End: 2021-06-11

## 2021-06-07 MED ORDER — LIDOCAINE HCL 2 % IJ SOLN
2 % | INTRAMUSCULAR | Status: AC
Start: 2021-06-07 — End: ?

## 2021-06-07 MED ORDER — HYDROMORPHONE HCL 2 MG/ML IJ SOLN
2 MG/ML | INTRAMUSCULAR | Status: DC | PRN
Start: 2021-06-07 — End: 2021-06-07
  Administered 2021-06-07: 15:00:00 .4 via INTRAVENOUS

## 2021-06-07 MED ORDER — HEPARIN (PORCINE) IN NACL 2000-0.9 UNIT/L-% IV SOLN
INTRAVENOUS | Status: AC
Start: 2021-06-07 — End: ?

## 2021-06-07 MED ORDER — LACTATED RINGERS IV SOLN
INTRAVENOUS | Status: DC
Start: 2021-06-07 — End: 2021-06-11

## 2021-06-07 MED ORDER — ONDANSETRON HCL 4 MG/2ML IJ SOLN
42 MG/2ML | INTRAMUSCULAR | Status: DC | PRN
Start: 2021-06-07 — End: 2021-06-07
  Administered 2021-06-07: 16:00:00 4 via INTRAVENOUS

## 2021-06-07 MED ORDER — HEPARIN SODIUM (PORCINE) 1000 UNIT/ML IJ SOLN
1000 UNIT/ML | INTRAMUSCULAR | Status: AC
Start: 2021-06-07 — End: ?

## 2021-06-07 MED ORDER — ESMOLOL HCL-SODIUM CHLORIDE 2500 MG/250ML IV SOLN
2500250 MG/250ML | INTRAVENOUS | Status: DC | PRN
Start: 2021-06-07 — End: 2021-06-07
  Administered 2021-06-07 (×2): 20 via INTRAVENOUS

## 2021-06-07 MED ORDER — CEFAZOLIN 2000 MG IN 20 ML SWFI IV SYRINGE (PREMIX)
Status: AC
Start: 2021-06-07 — End: ?

## 2021-06-07 MED ORDER — SODIUM CHLORIDE 0.9 % IV SOLN
0.9 % | INTRAVENOUS | Status: DC | PRN
Start: 2021-06-07 — End: 2021-06-11

## 2021-06-07 MED ORDER — PROPOFOL 200 MG/20ML IV EMUL
20020 MG/20ML | INTRAVENOUS | Status: DC | PRN
Start: 2021-06-07 — End: 2021-06-07
  Administered 2021-06-07: 13:00:00 50 via INTRAVENOUS
  Administered 2021-06-07: 13:00:00 200 via INTRAVENOUS
  Administered 2021-06-07: 13:00:00 50 via INTRAVENOUS
  Administered 2021-06-07: 16:00:00 40 via INTRAVENOUS

## 2021-06-07 MED ORDER — ROCURONIUM BROMIDE 50 MG/5ML IV SOLN
505 MG/5ML | INTRAVENOUS | Status: DC | PRN
Start: 2021-06-07 — End: 2021-06-07
  Administered 2021-06-07: 13:00:00 5 via INTRAVENOUS
  Administered 2021-06-07: 13:00:00 30 via INTRAVENOUS
  Administered 2021-06-07 (×2): 20 via INTRAVENOUS
  Administered 2021-06-07: 13:00:00 15 via INTRAVENOUS
  Administered 2021-06-07: 15:00:00 10 via INTRAVENOUS

## 2021-06-07 MED ORDER — IOPAMIDOL 61 % IV SOLN
61 % | Freq: Once | INTRAVENOUS | Status: AC | PRN
Start: 2021-06-07 — End: 2021-06-07
  Administered 2021-06-07: 16:00:00 130

## 2021-06-07 MED ORDER — LIDOCAINE HCL (PF) 2 % IJ SOLN
2 % | INTRAMUSCULAR | Status: DC | PRN
Start: 2021-06-07 — End: 2021-06-07
  Administered 2021-06-07: 13:00:00 100 via INTRAVENOUS

## 2021-06-07 MED ORDER — ONDANSETRON HCL 4 MG/2ML IJ SOLN
42 MG/2ML | Freq: Once | INTRAMUSCULAR | Status: AC | PRN
Start: 2021-06-07 — End: 2021-06-08

## 2021-06-07 MED ORDER — LACTATED RINGERS IV SOLN
INTRAVENOUS | Status: DC
Start: 2021-06-07 — End: 2021-06-11
  Administered 2021-06-07 (×2): via INTRAVENOUS

## 2021-06-07 MED ORDER — HYDROMORPHONE HCL PF 2 MG/ML IJ SOLN
2 MG/ML | INTRAMUSCULAR | Status: DC | PRN
Start: 2021-06-07 — End: 2021-06-11

## 2021-06-07 MED ORDER — OXYCODONE HCL 5 MG PO TABS
5 MG | Freq: Once | ORAL | Status: AC | PRN
Start: 2021-06-07 — End: 2021-06-08

## 2021-06-07 MED ORDER — DEXAMETHASONE 4 MG/ML IJ SOLN (MIXTURES ONLY)
4 MG/ML | INTRAMUSCULAR | Status: DC | PRN
Start: 2021-06-07 — End: 2021-06-07
  Administered 2021-06-07: 13:00:00 10 via INTRAVENOUS

## 2021-06-07 MED ORDER — HYDROMORPHONE HCL 2 MG/ML IJ SOLN
2 MG/ML | INTRAMUSCULAR | Status: AC
Start: 2021-06-07 — End: ?

## 2021-06-07 MED ORDER — LIDOCAINE HCL 2 % IJ SOLN
2 % | Freq: Once | INTRAMUSCULAR | Status: AC | PRN
Start: 2021-06-07 — End: 2021-06-07
  Administered 2021-06-07: 15:00:00 10 via INTRADERMAL

## 2021-06-07 MED ORDER — NEOSTIGMINE METHYLSULFATE 3 MG/3ML IV SOSY
3 MG/ML | INTRAVENOUS | Status: DC | PRN
Start: 2021-06-07 — End: 2021-06-07
  Administered 2021-06-07: 16:00:00 5 via INTRAVENOUS

## 2021-06-07 MED ORDER — HEPARIN (PORCINE) IN NACL 2000-0.9 UNIT/L-% IV SOLN
2000-0.9- UNIT/L-% | INTRAVENOUS | Status: AC | PRN
Start: 2021-06-07 — End: 2021-06-07
  Administered 2021-06-07 (×3): 1000

## 2021-06-07 MED ORDER — DEXTROSE 10 % IV SOLN
10 % | INTRAVENOUS | Status: DC | PRN
Start: 2021-06-07 — End: 2021-06-11

## 2021-06-07 MED ORDER — SUGAMMADEX SODIUM 200 MG/2ML IV SOLN
2002 MG/2ML | INTRAVENOUS | Status: DC | PRN
Start: 2021-06-07 — End: 2021-06-07
  Administered 2021-06-07: 16:00:00 400 via INTRAVENOUS

## 2021-06-07 MED ORDER — SUGAMMADEX SODIUM 200 MG/2ML IV SOLN
200 MG/2ML | INTRAVENOUS | Status: AC
Start: 2021-06-07 — End: ?

## 2021-06-07 MED ORDER — GLYCOPYRROLATE 0.4 MG/2ML IJ SOLN
0.4 MG/2ML | INTRAMUSCULAR | Status: DC | PRN
Start: 2021-06-07 — End: 2021-06-07
  Administered 2021-06-07: 16:00:00 .8 via INTRAVENOUS

## 2021-06-07 MED FILL — HYDROMORPHONE HCL 2 MG/ML IJ SOLN: 2 MG/ML | INTRAMUSCULAR | Qty: 1

## 2021-06-07 MED FILL — HEPARIN (PORCINE) IN NACL 2000-0.9 UNIT/L-% IV SOLN: INTRAVENOUS | Qty: 3000

## 2021-06-07 MED FILL — BRIDION 200 MG/2ML IV SOLN: 200 MG/2ML | INTRAVENOUS | Qty: 4

## 2021-06-07 MED FILL — XYLOCAINE 2 % IJ SOLN: 2 % | INTRAMUSCULAR | Qty: 20

## 2021-06-07 MED FILL — CEFAZOLIN 2000 MG IN 20 ML SWFI IV SYRINGE (PREMIX): Qty: 2000

## 2021-06-07 MED FILL — HEPARIN SODIUM (PORCINE) 1000 UNIT/ML IJ SOLN: 1000 UNIT/ML | INTRAMUSCULAR | Qty: 10

## 2021-06-07 MED FILL — ISOVUE-300 61 % IV SOLN: 61 % | INTRAVENOUS | Qty: 100

## 2021-06-07 NOTE — OR Nursing (Signed)
Prep complete. Patient ready for procedure

## 2021-06-07 NOTE — Brief Op Note (Signed)
Department of Interventional Radiology  [phone]        Interventional Radiology Brief Procedure Note    Patient: Chad Rosario MRN: [id number]  SSN: [id number]    Date of Birth: 01-10-1956  Age: 66 y.o.  Sex: male      Date of Procedure: 06/07/2021    Pre-Procedure Diagnosis: Chronic RLE venous occlusion, pain, swelling.    Post-Procedure Diagnosis: SAME    Procedure(s):  Venogram    Brief Description of Procedure: PTV/PopV recanalization w PA.     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      Complications: None    Recommendations: 1 hour bedrest.  Continue Eliquis, Compression Hose, Exercise.       Follow Up: Office in 2-3 weeks.      Signed By: Lottie Dawson, MD     June 07, 2021

## 2021-06-07 NOTE — Other (Signed)
TRANSFER - OUT REPORT:    Verbal report given to [facility] on Chad Rosario  being transferred to IR room 5 for routine post-op       Report consisted of patient???s Situation, Background, Assessment and   Recommendations(SBAR).     Information from the following report(s) Adult Overview, Surgery Report, Intake/Output, MAR, Recent Results, Med Rec Status, and Cardiac Rhythm NS  was reviewed with the receiving nurse.    Lines:   Peripheral IV 06/07/21 Left Antecubital (Active)   Site Assessment Clean, dry & intact 06/07/21 1204   Line Status Infusing 06/07/21 1204   Phlebitis Assessment No symptoms 06/07/21 1204   Infiltration Assessment 0 06/07/21 1204   Dressing Status Clean, dry & intact 06/07/21 1204   Dressing Type Transparent 06/07/21 1204       Peripheral IV 06/07/21 Right;Posterior Hand (Active)   Site Assessment Clean, dry & intact 06/07/21 1204   Line Status Infusing 06/07/21 1204   Phlebitis Assessment No symptoms 06/07/21 1204   Infiltration Assessment 0 06/07/21 1204   Dressing Status Clean, dry & intact 06/07/21 1204   Dressing Type Transparent 06/07/21 1204        Opportunity for questions and clarification was provided.      Patient transported with:   O2 @ 0 liters    VTE prophylaxis orders have been written for [name].    Patient and family given floor number and nurses name.  Family updated re: pt status after security code verified.

## 2021-06-07 NOTE — Anesthesia Post-Procedure Evaluation (Signed)
Department of Anesthesiology  Postprocedure Note    Patient: Chad Rosario  MRN: [id number]  Birthdate: 03/02/1956  Date of evaluation: 06/07/2021      Procedure Summary     Date: 06/07/21 Room / Location: [facility]    Anesthesia Start: [phone] Anesthesia Stop: 1205    Procedure: IR VENOGRAM LOWER EXTREMITY BILATERAL Diagnosis: Acute deep vein thrombosis (DVT) of right lower extremity, unspecified vein (HCC)    Scheduled Providers: Vanetta Shawl, APRN - CRNA; Lyna Poser, MD Responsible Provider: Lyna Poser, MD    Anesthesia Type: general ASA Status: 3          Anesthesia Type: No value filed.    Aldrete Phase I: Aldrete Score: 10    Aldrete Phase II:        Anesthesia Post Evaluation    Patient location during evaluation: PACU  Patient participation: complete - patient participated  Level of consciousness: awake and alert  Airway patency: patent  Nausea & Vomiting: no nausea and no vomiting  Complications: no  Cardiovascular status: hemodynamically stable  Respiratory status: acceptable, nonlabored ventilation and spontaneous ventilation  Hydration status: euvolemic  Comments: BP (!) 192/92    Pulse 88    Temp 98.6 ??F (37 ??C) (Temporal)    Resp 16    SpO2 95%     Multimodal analgesia pain management approach

## 2021-06-07 NOTE — Anesthesia Procedure Notes (Signed)
Airway  Date/Time: 06/07/2021 8:34 AM  Urgency: elective    Airway not difficult    General Information and Staff    Patient location during procedure: OR  Performed: resident/CRNA     Indications and Patient Condition  Indications for airway management: anesthesia  Spontaneous Ventilation: absent  Sedation level: deep  Preoxygenated: yes  Patient position: sniffing  MILS not maintained throughout  Mask difficulty assessment: vent by bag mask + OA or adjuvant +/- NMBA    Final Airway Details  Final airway type: endotracheal airway      Successful airway: ETT  Cuffed: yes   Successful intubation technique: direct laryngoscopy  Facilitating devices/methods: intubating stylet  Endotracheal tube insertion site: oral  Blade: Miller  Blade size: #4  ETT size (mm): 8.0  Cormack-Lehane Classification: grade I - full view of glottis  Placement verified by: chest auscultation and capnometry   Measured from: lips  ETT to lips (cm): 23  Number of attempts at approach: 1  Ventilation between attempts: bag mask

## 2021-06-07 NOTE — OR Nursing (Signed)
Recovery period without difficulty. Pt alert and oriented and denies pain. Dressing is clean, dry, and intact. Reviewed discharge instructions with patient and spouse, both verbalized understanding. Pt escorted to lobby discharge area via wheelchair. Vital signs and Aldrete score completed.

## 2021-06-07 NOTE — Other (Signed)
12:29 PM  Report to [facility], Charity fundraiser.

## 2021-06-07 NOTE — H&P (Signed)
Department of Interventional Radiology  [phone]    History and Physical    Patient:  Chad Rosario MRN:  [id number]  SSN:  [id number]    Date of Birth:  Dec 18, 1955  Age:  66 y.o.  Sex:  male      Primary Care Provider:  [name], MD  Referring Physician:  Lottie Dawson, MD    Subjective:     Chief Complaint: Chronic RLE DVT    History of the Present Illness:  The patient is a 66 y.o. male who presents for venogram of right lower extremity, thrombectomy, and possible stent in with general anesthesia.  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  He is followed by Dr Karie Mainland and also has a history of thrombocytopenia.    Most recent ultrasound was on 05-02-2021 which showed nonocclusive thrombus involving the right femoral and popliteal vein.  Patient has chronic discomfort and venous stasis skin changes.  No significant swelling.  He is n.p.o. today and held his Eliquis only this morning.        Past Medical History:   Diagnosis Date    CAD (coronary artery disease) 2015    Hypertension     controlled with meds    Neuropathy May 2019    Sleep apnea     cpap at night     Past Surgical History:   Procedure Laterality Date    BACK SURGERY  2021    has had 3 different ones last was in 2021    CARDIAC SURGERY      CABG    CHOLECYSTECTOMY      COLONOSCOPY N/A 11/29/2020    COLONOSCOPY POLYPECTOMY HOT SNARE performed by Drucie Opitz, MD at [facility] ENDOSCOPY    EYE SURGERY Left     cataract    FRACTURE SURGERY Left     tib-fib fracture    TOTAL HIP ARTHROPLASTY Left         Review of Systems:    Pertinent items are noted in HPI.    Prior to Admission medications    Medication Sig Start Date End Date Taking? Authorizing Provider   ELIQUIS 5 MG TABS tablet Starting in a week 05/18/21   Historical Provider, MD   rivaroxaban (XARELTO) 20 MG TABS tablet Take 1 tablet by mouth daily (with breakfast)  Patient not taking: Reported on 06/07/2021 05/02/21   Hector Shade, MD   colchicine  (MITIGARE) 0.6 MG capsule Take 1 capsule by mouth as needed for Pain  Patient not taking: No sig reported 04/26/21   Lulu Riding, PA-C   predniSONE (DELTASONE) 10 MG tablet Take 3 tablets daily x 2 days then 2 tablets daily x 2 days then 1 tablet daily x 2 days  Patient not taking: No sig reported 04/26/21   Lulu Riding, PA-C   Acetaminophen 500 MG CAPS Take 1,000 mg by mouth as needed  Patient not taking: Reported on 06/07/2021 04/04/20   Historical Provider, MD   allopurinol (ZYLOPRIM) 300 MG tablet TAKE 1 TABLET BY MOUTH  DAILY 12/27/19   Historical Provider, MD   Alpha-Lipoic Acid 200 MG CAPS Take 300 mg by mouth    Historical Provider, MD   amitriptyline (ELAVIL) 10 MG tablet nightly as needed 08/02/20   Historical Provider, MD   aspirin 81 MG EC tablet Take 81 mg by mouth in the morning. 06/13/20   Historical Provider, MD   azelastine (ASTELIN) 0.1 % nasal  spray 1 spray by Nasal route as needed 01/21/20   Historical Provider, MD   fexofenadine (ALLEGRA) 180 MG tablet Take 180 mg by mouth in the morning.    Historical Provider, MD   gabapentin (NEURONTIN) 300 MG capsule Take 300 capsules by mouth in the morning, at noon, and at bedtime. Take 2 capsules 3 times a day 06/29/20   Historical Provider, MD   lisinopril (PRINIVIL;ZESTRIL) 20 MG tablet Take by mouth daily 06/13/20   Historical Provider, MD   zolpidem (AMBIEN) 10 MG tablet TAKE 1 TABLET BY MOUTH  DAILY AS NEEDED 07/24/20   Historical Provider, MD        Allergies   Allergen Reactions    Hydrocodone-Acetaminophen Anxiety and Itching    Losartan Shortness Of Breath    Amlodipine Other (See Comments)     Other reaction(s): Other (See Comments)  Fatigue   Fatigue       Rosuvastatin Other (See Comments)     Other reaction(s): Constipation  Fatigue, constipation  Other reaction(s): Fatigue    Atorvastatin Diarrhea    Ezetimibe      Other reaction(s): Constipation, Other (See Comments)  Constipation and decreased urine output  Constipation and decreased urine  output  Other reaction(s): Constipation, Urine output low         Family History   Problem Relation Age of Onset    Cancer Mother         Lymp    Stroke Father     Cancer Sister         Breast     Social History     Tobacco Use    Smoking status: Never    Smokeless tobacco: Never   Substance Use Topics    Alcohol use: Yes     Alcohol/week: 12.0 standard drinks     Types: 6 Cans of beer, 6 Shots of liquor per week     Comment: socially        Not in a hospital admission.    Objective:       Physical Examination:    Vitals:    06/07/21 0714   BP: (!) 147/76   SpO2: 95%       Pain Assessment  Pain Level: 4  Pain Location: Leg  Pain Orientation: Right, Left          Pain Level: 4       Pain Location: Leg   Pain Orientation: Right, Left   Pain Descriptors: Aching                       Constitutional: Patient well-appearing in no apparent distress  Cardiovascular: Regular rate and rhythm with no murmur or gallop  Respiratory: Lungs are clear to auscultation with nonlabored breathing  Abdomen: Benign  Extremities: Venous stasis skin changes noted to lower legs with mild erythema.  Palpable dorsalis pedis pulses.  No significant swelling noted in the right leg.  Minimal calf tenderness to palpation    Laboratory:     Lab Results   Component Value Date/Time    NA 137 05/23/2021 10:01 AM    NA 139 04/13/2021 08:27 AM    K 4.5 05/23/2021 10:01 AM    K 4.6 04/13/2021 08:27 AM    CL 109 05/23/2021 10:01 AM    CL 106 04/13/2021 08:27 AM    CO2 21 05/23/2021 10:01 AM    CO2 24 04/13/2021 08:27 AM    BUN 21 05/23/2021 10:01  AM    BUN 19 04/13/2021 08:27 AM    GFRAA >60 12/18/2020 09:12 AM    GFRAA >60 11/02/2020 08:16 AM    GLOB 3.4 04/13/2021 08:27 AM    GLOB 3.4 02/27/2021 08:45 AM    ALT 41 04/13/2021 08:27 AM    ALT 40 02/27/2021 08:45 AM     Lab Results   Component Value Date/Time    WBC 7.6 05/23/2021 10:01 AM    WBC 9.6 04/13/2021 08:27 AM    HGB 16.0 05/23/2021 10:01 AM    HGB 15.8 04/13/2021 08:27 AM    HCT 46.6 05/23/2021  10:01 AM    HCT 46.9 04/13/2021 08:27 AM    PLT 88 05/23/2021 10:01 AM    PLT 71 04/13/2021 08:27 AM     Lab Results   Component Value Date/Time    INR 1.1 05/23/2021 10:01 AM       Assessment:     66 year old male with history of thrombocytopenia and chronic right lower extremity DVT        Plan:     Planned Procedure: Venogram of lower extremities, angioplasty, thrombectomy, and possible stenting with general anesthesia    Risks, benefits, and alternatives reviewed with patient and he agrees to proceed with the procedure.      Signed By: Johnnette Gourd, PA     June 07, 2021

## 2021-06-07 NOTE — Discharge Instructions (Addendum)
[place]  [facility]   Department of Interventional Radiology   [facility]   [phone] Office   [phone] Fax                 ARTERIOVENOUS FISTULA, GRAFT ACCESS, REVISION, OR DECLOT           ACTIVITY: Limited activity today.  Keep access arm straight and raised above the level of your heart for 24 hours or until the swelling goes away.           DIET: May have your usual food, unless told otherwise by your doctor.           PAIN: If you have pain, you can take whatever you have at home including acetaminophen (Tylenol) or Ibuprofren (Advil) as needed. Ice packs are also recommended. If this does not control your pain, call your doctor. DO NOT TAKE ASPIRIN OR MEDICINE WITH ASPIRIN IN IT, unless your doctor says you may.           DRESSING CARE:  Keep your dressing clean and dry. Do not use alcohol, antibiotic ointment or hydrogen peroxide on the area. Leave your dressing on until the Dialysis Nurse or your doctor takes it off.           BLEEDING: If you have any bleeding put pressure over the bleeding area and call your doctor.           CARE OF YOUR ACCESS ARM:     You may have some bruising, swelling, and discoloration for several weeks. This is normal after an invasive procedure. After the swelling has gone down, you will be able to see the outline of the graft. By placing your fingertips over the graft you will be able to feel a vibration (thrill) that means blood is flowing and the graft is working.           DO:     1. Make sure your arm is washed with soap and water every day.     2. Feel for the ???thrill??? at area marked in the picture.     3. Call your Kidney doctor if you do not feel the ???thrill,??? or for any problems like swelling, redness, tingling, or numbness in access arm, pus, or fever over 101.           DO NOT:     1. Wear tight sleeves, watches, belts, or bracelets over the graft.     2. Carry heavy bags across the graft or fistula.     3. Sleep on  your graft side.     4. Let your blood pressure be taken in your access arm.     5. Let blood be drawn from your access arm.           For the next 24 hours, DO NOT Drive, Drink Alcoholic Beverages, or Make IMPORTANT Decisions.           Follow-Up Instructions:  Please see your ordering doctor as he/she has requested.           To Reach Us:   We can be reached at [phone], between 8 am and 5 pm Monday through Friday. After hours call the answering service at [phone] ask for the radiologist to be paged. If you have a medical emergency, go to the nearest emergency room, or call 911.

## 2021-06-07 NOTE — Anesthesia Pre-Procedure Evaluation (Addendum)
Department of Anesthesiology  Preprocedure Note       Name:  Chad Rosario   Age:  66 y.o.  DOB:  1955-10-03                                          MRN:  [id number]         Date:  06/07/2021      Surgeon: * No surgeons listed *    Procedure: * No procedures listed *    Medications prior to admission:   Prior to Admission medications    Medication Sig Start Date End Date Taking? Authorizing Provider   ELIQUIS 5 MG TABS tablet Starting in a week 05/18/21   Historical Provider, MD   rivaroxaban (XARELTO) 20 MG TABS tablet Take 1 tablet by mouth daily (with breakfast)  Patient not taking: Reported on 06/07/2021 05/02/21   Hector Shade, MD   colchicine (MITIGARE) 0.6 MG capsule Take 1 capsule by mouth as needed for Pain  Patient not taking: No sig reported 04/26/21   Lulu Riding, PA-C   predniSONE (DELTASONE) 10 MG tablet Take 3 tablets daily x 2 days then 2 tablets daily x 2 days then 1 tablet daily x 2 days  Patient not taking: No sig reported 04/26/21   Lulu Riding, PA-C   Acetaminophen 500 MG CAPS Take 1,000 mg by mouth as needed  Patient not taking: Reported on 06/07/2021 04/04/20   Historical Provider, MD   allopurinol (ZYLOPRIM) 300 MG tablet TAKE 1 TABLET BY MOUTH  DAILY 12/27/19   Historical Provider, MD   Alpha-Lipoic Acid 200 MG CAPS Take 300 mg by mouth    Historical Provider, MD   amitriptyline (ELAVIL) 10 MG tablet nightly as needed 08/02/20   Historical Provider, MD   aspirin 81 MG EC tablet Take 81 mg by mouth in the morning. 06/13/20   Historical Provider, MD   azelastine (ASTELIN) 0.1 % nasal spray 1 spray by Nasal route as needed 01/21/20   Historical Provider, MD   fexofenadine (ALLEGRA) 180 MG tablet Take 180 mg by mouth in the morning.    Historical Provider, MD   gabapentin (NEURONTIN) 300 MG capsule Take 300 capsules by mouth in the morning, at noon, and at bedtime. Take 2 capsules 3 times a day 06/29/20   Historical Provider, MD   lisinopril (PRINIVIL;ZESTRIL) 20 MG tablet Take by mouth daily 06/13/20    Historical Provider, MD   zolpidem (AMBIEN) 10 MG tablet TAKE 1 TABLET BY MOUTH  DAILY AS NEEDED 07/24/20   Historical Provider, MD       Current medications:    Current Outpatient Medications   Medication Sig Dispense Refill   ??? ELIQUIS 5 MG TABS tablet Starting in a week     ??? rivaroxaban (XARELTO) 20 MG TABS tablet Take 1 tablet by mouth daily (with breakfast) (Patient not taking: Reported on 06/07/2021) 90 tablet 1   ??? colchicine (MITIGARE) 0.6 MG capsule Take 1 capsule by mouth as needed for Pain (Patient not taking: No sig reported) 30 capsule 3   ??? predniSONE (DELTASONE) 10 MG tablet Take 3 tablets daily x 2 days then 2 tablets daily x 2 days then 1 tablet daily x 2 days (Patient not taking: No sig reported) 12 tablet 0   ??? Acetaminophen 500 MG CAPS Take 1,000 mg by mouth as needed (Patient not  taking: Reported on 06/07/2021)     ??? allopurinol (ZYLOPRIM) 300 MG tablet TAKE 1 TABLET BY MOUTH  DAILY     ??? Alpha-Lipoic Acid 200 MG CAPS Take 300 mg by mouth     ??? amitriptyline (ELAVIL) 10 MG tablet nightly as needed     ??? aspirin 81 MG EC tablet Take 81 mg by mouth in the morning.     ??? azelastine (ASTELIN) 0.1 % nasal spray 1 spray by Nasal route as needed     ??? fexofenadine (ALLEGRA) 180 MG tablet Take 180 mg by mouth in the morning.     ??? gabapentin (NEURONTIN) 300 MG capsule Take 300 capsules by mouth in the morning, at noon, and at bedtime. Take 2 capsules 3 times a day     ??? lisinopril (PRINIVIL;ZESTRIL) 20 MG tablet Take by mouth daily     ??? zolpidem (AMBIEN) 10 MG tablet TAKE 1 TABLET BY MOUTH  DAILY AS NEEDED       No current facility-administered medications for this visit.     Facility-Administered Medications Ordered in Other Visits   Medication Dose Route Frequency Provider Last Rate Last Admin   ??? lidocaine 1 % injection 1 mL  1 mL IntraDERmal Once PRN [place], MD       ??? lactated ringers IV soln infusion   IntraVENous Continuous [place], MD       ??? sodium chloride flush 0.9 % injection  5-40 mL  5-40 mL IntraVENous 2 times per day [place], MD       ??? sodium chloride flush 0.9 % injection 5-40 mL  5-40 mL IntraVENous PRN [place], MD       ??? 0.9 % sodium chloride infusion   IntraVENous PRN [place], MD           Allergies:    Allergies   Allergen Reactions   ??? Hydrocodone-Acetaminophen Anxiety and Itching   ??? Losartan Shortness Of Breath   ??? Amlodipine Other (See Comments)     Other reaction(s): Other (See Comments)  Fatigue   Fatigue      ??? Rosuvastatin Other (See Comments)     Other reaction(s): Constipation  Fatigue, constipation  Other reaction(s): Fatigue   ??? Atorvastatin Diarrhea   ??? Ezetimibe      Other reaction(s): Constipation, Other (See Comments)  Constipation and decreased urine output  Constipation and decreased urine output  Other reaction(s): Constipation, Urine output low         Problem List:    Patient Active Problem List   Diagnosis Code   ??? Obesity (BMI 30-39.9) E66.9   ??? History of DVT (deep vein thrombosis) Z86.718   ??? 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 Hypertension I10   ??? Hyperlipidemia E78.5   ??? Chronic dyspnea R06.09   ??? Thrombocytopenia, unspecified D69.6   ??? Acute embolism and thrombosis of unspecified deep veins of right lower extremity (HCC) I82.401       Past Medical History:        Diagnosis Date   ??? CAD (coronary artery disease) 2015   ??? Hypertension     controlled with meds   ??? Neuropathy May 2019   ??? Sleep apnea     cpap at night       Past Surgical History:        Procedure Laterality Date   ??? BACK SURGERY  2021    has had 3 different  ones last was in 2021   ??? CARDIAC SURGERY      CABG   ??? CHOLECYSTECTOMY     ??? COLONOSCOPY N/A 11/29/2020    COLONOSCOPY POLYPECTOMY HOT SNARE performed by Drucie Opitzhody Fawaz, [facility] at [facility]   ??? EYE SURGERY Left     cataract   ??? FRACTURE SURGERY Left     tib-fib fracture   ??? TOTAL HIP ARTHROPLASTY Left        Social History:    Social History     Tobacco Use   ??? Smoking status: Never   ??? Smokeless tobacco: Never   Substance Use  Topics   ??? Alcohol use: Yes     Alcohol/week: 12.0 standard drinks     Types: 6 Cans of beer, 6 Shots of liquor per week     Comment: socially                                Counseling given: Not Answered      Vital Signs (Current):   There were no vitals filed for this visit.                                           BP Readings from Last 3 Encounters:   06/07/21 (!) 147/76   05/21/21 130/72   04/26/21 (!) 140/80       NPO Status:                                                                                 BMI:   Wt Readings from Last 3 Encounters:   05/21/21 285 lb 9.6 oz (129.5 kg)   04/26/21 286 lb 6.4 oz (129.9 kg)   04/13/21 282 lb (127.9 kg)     There is no height or weight on file to calculate BMI.    CBC:   Lab Results   Component Value Date/Time    WBC 7.6 05/23/2021 10:01 AM    RBC 5.45 05/23/2021 10:01 AM    HGB 16.0 05/23/2021 10:01 AM    HCT 46.6 05/23/2021 10:01 AM    MCV 85.5 05/23/2021 10:01 AM    RDW 16.7 05/23/2021 10:01 AM    PLT 88 05/23/2021 10:01 AM       CMP:   Lab Results   Component Value Date/Time    NA 137 05/23/2021 10:01 AM    K 4.5 05/23/2021 10:01 AM    CL 109 05/23/2021 10:01 AM    CO2 21 05/23/2021 10:01 AM    BUN 21 05/23/2021 10:01 AM    CREATININE 1.10 05/23/2021 10:01 AM    GFRAA >60 12/18/2020 09:12 AM    LABGLOM >60 05/23/2021 10:01 AM    GLUCOSE 101 05/23/2021 10:01 AM    PROT 7.3 04/13/2021 08:27 AM    CALCIUM 9.0 05/23/2021 10:01 AM    BILITOT 1.2 04/13/2021 08:27 AM    ALKPHOS 62 04/13/2021 08:27 AM    AST 30 04/13/2021 08:27 AM    ALT 41  04/13/2021 08:27 AM       POC Tests: No results for input(s): POCGLU, POCNA, POCK, POCCL, POCBUN, POCHEMO, POCHCT in the last 72 hours.    Coags:   Lab Results   Component Value Date/Time    PROTIME 14.3 05/23/2021 10:01 AM    INR 1.1 05/23/2021 10:01 AM       HCG (If Applicable): No results found for: PREGTESTUR, PREGSERUM, HCG, HCGQUANT     ABGs: No results found for: PHART, PO2ART, PCO2ART, HCO3ART, BEART, O2SATART     Type & Screen  (If Applicable):  No results found for: LABABO, LABRH    Drug/Infectious Status (If Applicable):  No results found for: HIV, HEPCAB    COVID-19 Screening (If Applicable): No results found for: COVID19        Anesthesia Evaluation  Patient summary reviewed and Nursing notes reviewed no history of anesthetic complications:   Airway: Mallampati: I  TM distance: >3 FB   Neck ROM: full  Mouth opening: > = 3 FB   Dental:      Comment: Fillings    Pulmonary: breath sounds clear to auscultation  (+) shortness of breath:  sleep apnea: on CPAP,                             Cardiovascular:  Exercise tolerance: good (>4 METS),   (+) hypertension:, CAD:, CABG/stent (2vCABG):, hyperlipidemia        Rhythm: regular  Rate: normal                    Neuro/Psych:                ROS comment: Neuropathy, BLE weakness    Has hardware in back, currently with broken screw GI/Hepatic/Renal:             Endo/Other:    (+) blood dyscrasia: thrombocytopenia and anticoagulation therapy, arthritis (gout):., .                 Abdominal:             Vascular:   + DVT, .       Other Findings:             Anesthesia Plan      general     ASA 3     (GETA    Last dose Eliquis yesterday)  Induction: intravenous.    MIPS: Postoperative opioids intended.  Anesthetic plan and risks discussed with patient and spouse.                        [place], MD   06/07/2021

## 2021-06-21 MED ORDER — GABAPENTIN 300 MG PO CAPS
300 MG | ORAL_CAPSULE | Freq: Three times a day (TID) | ORAL | 3 refills | Status: AC
Start: 2021-06-21 — End: 2022-06-16

## 2021-06-21 MED ORDER — LISINOPRIL 20 MG PO TABS
20 MG | ORAL_TABLET | Freq: Every day | ORAL | 3 refills | Status: AC
Start: 2021-06-21 — End: 2022-06-04

## 2021-06-22 MED ORDER — APIXABAN 5 MG PO TABS
5 MG | ORAL_TABLET | Freq: Two times a day (BID) | ORAL | 0 refills | Status: DC
Start: 2021-06-22 — End: 2021-09-19

## 2021-06-22 NOTE — Telephone Encounter (Signed)
Patient called stating he needed a refill on his Eliquis.  He prefers to get a 90-day supply as it is less expensive for him.  I told him I can put in a refill for him but long-term it would be best if his hematologist Dr. Karie Mainland managed his anticoagulation.

## 2021-07-06 ENCOUNTER — Encounter

## 2021-07-10 ENCOUNTER — Encounter

## 2021-07-10 LAB — CBC WITH AUTO DIFFERENTIAL
Absolute Immature Granulocyte: 0 10*3/uL (ref 0.0–0.5)
Basophils %: 1 % (ref 0.0–2.0)
Basophils Absolute: 0.1 10*3/uL (ref 0.0–0.2)
Eosinophils %: 13 % — ABNORMAL HIGH (ref 0.5–7.8)
Eosinophils Absolute: 1 10*3/uL — ABNORMAL HIGH (ref 0.0–0.8)
Hematocrit: 47.5 % (ref 41.1–50.3)
Hemoglobin: 16.7 g/dL (ref 13.6–17.2)
Immature Granulocytes: 0 % (ref 0.0–5.0)
Lymphocytes %: 20 % (ref 13–44)
Lymphocytes Absolute: 1.5 10*3/uL (ref 0.5–4.6)
MCH: 30.3 PG (ref 26.1–32.9)
MCHC: 35.2 g/dL — ABNORMAL HIGH (ref 31.4–35.0)
MCV: 86.2 FL (ref 82–102)
MPV: 12.2 FL (ref 9.4–12.3)
Monocytes %: 10 % (ref 4.0–12.0)
Monocytes Absolute: 0.8 10*3/uL (ref 0.1–1.3)
Neutrophils %: 57 % (ref 43–78)
Neutrophils Absolute: 4.4 10*3/uL (ref 1.7–8.2)
Platelets: 69 10*3/uL — AB (ref 150–450)
RBC: 5.51 M/uL (ref 4.23–5.6)
RDW: 16.6 % — ABNORMAL HIGH (ref 11.9–14.6)
WBC: 7.7 10*3/uL (ref 4.3–11.1)
nRBC: 0 10*3/uL (ref 0.0–0.2)

## 2021-07-11 LAB — IRON: Iron: 97 ug/dL (ref 35–150)

## 2021-07-11 LAB — LIPID PANEL
Chol/HDL Ratio: 4.4
Cholesterol, Total: 185 MG/DL (ref <200)
HDL: 42 MG/DL (ref 40–60)
LDL Calculated: 90.6 MG/DL (ref <100)
Triglycerides: 262 MG/DL — ABNORMAL HIGH (ref 35–150)
VLDL Cholesterol Calculated: 52.4 MG/DL — ABNORMAL HIGH (ref 6.0–23.0)

## 2021-07-11 LAB — COMPREHENSIVE METABOLIC PANEL
ALT: 52 U/L (ref 12–65)
AST: 44 U/L — ABNORMAL HIGH (ref 15–37)
Albumin/Globulin Ratio: 1.1 (ref 0.4–1.6)
Albumin: 3.9 g/dL (ref 3.2–4.6)
Alk Phosphatase: 68 U/L (ref 50–136)
Anion Gap: 3 mmol/L (ref 2–11)
BUN: 20 MG/DL (ref 8–23)
CO2: 25 mmol/L (ref 21–32)
Calcium: 9.1 MG/DL (ref 8.3–10.4)
Chloride: 107 mmol/L (ref 101–110)
Creatinine: 1.1 MG/DL (ref 0.8–1.5)
Est, Glom Filt Rate: >60 mL/min/{1.73_m2} (ref >60)
Globulin: 3.6 g/dL (ref 2.8–4.5)
Glucose: 103 mg/dL — ABNORMAL HIGH (ref 65–100)
Potassium: 4.4 mmol/L (ref 3.5–5.1)
Sodium: 135 mmol/L (ref 133–143)
Total Bilirubin: 1 MG/DL (ref 0.2–1.1)
Total Protein: 7.5 g/dL (ref 6.3–8.2)

## 2021-07-11 LAB — URIC ACID: Uric Acid: 5.3 MG/DL (ref 2.6–6.0)

## 2021-07-11 LAB — FERRITIN: Ferritin: 27 NG/ML (ref 8–388)

## 2021-07-19 ENCOUNTER — Ambulatory Visit: Admit: 2021-07-19 | Discharge: 2021-07-19 | Payer: MEDICARE | Attending: Family Medicine | Primary: Family Medicine

## 2021-07-19 DIAGNOSIS — G63 Polyneuropathy in diseases classified elsewhere: Secondary | ICD-10-CM

## 2021-07-20 ENCOUNTER — Encounter: Admit: 2021-07-20 | Discharge: 2021-07-20 | Payer: MEDICARE | Attending: Family | Primary: Family Medicine

## 2021-07-20 DIAGNOSIS — G4733 Obstructive sleep apnea (adult) (pediatric): Secondary | ICD-10-CM

## 2021-07-20 MED ORDER — ZOLPIDEM TARTRATE 10 MG PO TABS
10 MG | ORAL_TABLET | Freq: Every evening | ORAL | 0 refills | Status: AC | PRN
Start: 2021-07-20 — End: 2022-01-16

## 2021-07-20 NOTE — Progress Notes (Signed)
[facility]  [address]  [place], [place] [address]  [phone]    Patient Name:  Chad Rosario  Date of Birth:  1955/04/16      Office Visit 07/20/2021    CHIEF COMPLAINT:    Chief Complaint   Patient presents with    New Patient       HISTORY OF PRESENT ILLNESS:      The patient presents in outpatient consultation at the request of Dr. Sol Blazing for management of obstructive sleep apnea. Past medical history includes hypertension, DVT, obesity, history of CABG, hyperlipidemia, thrombocytopenia. Patient was diagnosed with sleep apnea in 2005. He was a patient at Dr. Larose Kells, neurology, in [facility]. Patient states that he was referred for a sleep study at that time due to snoring, witnessed apneas and non-restorative sleep. He was started on Pap therapy and noticed an improvement in feeling more rested and energy level. He remains on the same pressure of CPAP of 11 cm. He reports a 25 pound weight gain since that time.    Download on his machine reveals 100% compliance over the last 90 days with average nightly use six hours and 45 minutes. AHI is 1.7 events per hour. He uses a nasal mask with a chinstrap and is happy with it. He was getting his supplies from apnea. He feels rested but does take an occasional afternoon nap. Epworth scores 8/24. He wakes once in the night. He usually goes to bed between 10 PM and wakes in the mornings between 4 and 5 AM. He does feel rested. He attributes his daytime sleepiness to gabapentin for which she takes for neuropathy. He takes gabapentin 300 mg three times a day.    He drinks 3 cups of coffee in the mornings and one soda during the day. He denies any tobacco use. He drinks alcohol on average 2 to 3 times per week but doesn't drink alcohol after 7 PM.    He has difficulty falling to sleep one to two times per week and will taken Ambien 10 mg at that time. He states that a 90 day prescription will last almost a year for him. He needs a  refill on this today.      Patient has two machines one that he uses at home and one with travel. He needs a refill on his supplies today.    We do not have a copy of his sleep study but are attempting to obtain this from his neurologist. His DME company did not have a copy. If we are unable to obtain a copy of his study, he will need to have a home sleep test.    Sleepiness Scale:   Sleep Medicine 07/20/2021   Sitting and reading 3   Watching TV 0   Sitting, inactive in a public place (e.g. a theatre or a meeting) 0   As a passenger in a car for an hour without a break 0   Lying down to rest in the afternoon when circumstances permit 3   Sitting and talking to someone 0   Sitting quietly after a lunch without alcohol 3   In a car, while stopped for a few minutes in traffic 0   Epworth Sleepiness Score 9        Past Medical History:   Diagnosis Date    CAD (coronary artery disease) 2015    Hypertension     controlled with meds    Neuropathy  May 2019    Sleep apnea     cpap at night         Patient Active Problem List   Diagnosis    Obesity (BMI 30-39.9)    History of DVT (deep vein thrombosis)    Hx of CABG    Hypertension    Hyperlipidemia    Chronic dyspnea    Thrombocytopenia, unspecified    Acute embolism and thrombosis of unspecified deep veins of right lower extremity [facility])           Past Surgical History:   Procedure Laterality Date    BACK SURGERY  2021    has had 3 different ones last was in 2021    CARDIAC SURGERY      CABG    CHOLECYSTECTOMY      COLONOSCOPY N/A 11/29/2020    COLONOSCOPY POLYPECTOMY HOT SNARE performed by Drucie Opitz, MD at [facility] ENDOSCOPY    EYE SURGERY Left     cataract    FRACTURE SURGERY Left     tib-fib fracture    TOTAL HIP ARTHROPLASTY Left          Social History     Socioeconomic History    Marital status: Married     Spouse name: Not on file    Number of children: Not on file    Years of education: Not on file    Highest education level: Not on file   Occupational History    Not on  file   Tobacco Use    Smoking status: Never    Smokeless tobacco: Never   Vaping Use    Vaping Use: Never used   Substance and Sexual Activity    Alcohol use: Yes     Alcohol/week: 12.0 standard drinks     Types: 6 Cans of beer, 6 Shots of liquor per week     Comment: socially    Drug use: Never    Sexual activity: Not Currently     Partners: Female   Other Topics Concern    Not on file   Social History Narrative    Not on file     Social Determinants of Health     Financial Resource Strain: Low Risk     Difficulty of Paying Living Expenses: Not hard at all   Food Insecurity: No Food Insecurity    Worried About Programme researcher, broadcasting/film/video in the Last Year: Never true    Ran Out of Food in the Last Year: Never true   Transportation Needs: Unknown    Lack of Transportation (Medical): Not on file    Lack of Transportation (Non-Medical): No   Physical Activity: Insufficiently Active    Days of Exercise per Week: 3 days    Minutes of Exercise per Session: 30 min   Stress: Not on file   Social Connections: Not on file   Intimate Partner Violence: Not on file   Housing Stability: Unknown    Unable to Pay for Housing in the Last Year: Not on file    Number of Places Lived in the Last Year: Not on file    Unstable Housing in the Last Year: No         Family History   Problem Relation Age of Onset    Cancer Mother         Lymp    Stroke Father     Cancer Sister         Breast  Allergies   Allergen Reactions    Hydrocodone-Acetaminophen Anxiety and Itching    Losartan Shortness Of Breath    Amlodipine Other (See Comments)     Other reaction(s): Other (See Comments)  Fatigue   Fatigue       Rosuvastatin Other (See Comments)     Other reaction(s): Constipation  Fatigue, constipation  Other reaction(s): Fatigue    Atorvastatin Diarrhea    Ezetimibe      Other reaction(s): Constipation, Other (See Comments)  Constipation and decreased urine output  Constipation and decreased urine output  Other reaction(s): Constipation, Urine  output low           Current Outpatient Medications   Medication Sig    zolpidem (AMBIEN) 10 MG tablet Take 1 tablet by mouth nightly as needed for Sleep for up to 180 days. Max Daily Amount: 10 mg    apixaban (ELIQUIS) 5 MG TABS tablet Take 1 tablet by mouth 2 times daily    gabapentin (NEURONTIN) 300 MG capsule Take 1 capsule by mouth in the morning, at noon, and at bedtime for 360 days.    lisinopril (PRINIVIL;ZESTRIL) 20 MG tablet Take 1 tablet by mouth daily    colchicine (MITIGARE) 0.6 MG capsule Take 1 capsule by mouth as needed for Pain    Acetaminophen 500 MG CAPS Take 2 capsules by mouth as needed    allopurinol (ZYLOPRIM) 300 MG tablet TAKE 1 TABLET BY MOUTH  DAILY    Alpha-Lipoic Acid 200 MG CAPS Take 300 mg by mouth    amitriptyline (ELAVIL) 10 MG tablet nightly as needed    aspirin 81 MG EC tablet Take 1 tablet by mouth daily    azelastine (ASTELIN) 0.1 % nasal spray 1 spray by Nasal route as needed    fexofenadine (ALLEGRA) 180 MG tablet Take 1 tablet by mouth daily     No current facility-administered medications for this visit.           REVIEW OF SYSTEMS:   CONSTITUTIONAL: weight gain   There is no history of fever, chills, night sweats, weight loss,  persistent fatigue, or lethargy/hypersomnolence.   EYES:   Denies problems with eye pain, erythema, blurred vision, or visual field loss.   ENTM:   Denies history of tinnitus, epistaxis, sore throat, hoarseness, or dysphonia.   LYMPH:   Denies swollen glands.   CARDIAC:   No chest pain, pressure, discomfort, palpitations, orthopnea, murmurs, or edema.   GI:   No dysphagia, heartburn reflux, nausea/vomiting, diarrhea, abdominal pain, or bleeding.   GU:   Denies history of dysuria, hematuria, polyuria, or decreased urine output.   MS:   No history of myalgias, arthralgias, bone pain, or muscle cramps.   SKIN:   No history of rashes, jaundice, cyanosis, nodules, or ulcers.   ENDO:   Negative for heat or cold intolerance.  No history of  DM.   PSYCH:   Negative for anxiety, depression, insomnia, hallucinations.   NEURO:   There is no history of AMS, persistent headache, decreased level of consciousness, seizures, or motor or sensory deficits.      PHYSICAL EXAM:    Vitals:    07/20/21 0843   BP: (!) 140/78   Pulse: 78   Resp: 16   Temp: 97 F (36.1 C)   SpO2: 98%   Weight: 287 lb (130.2 kg)   Height: 6\' 1"  (1.854 m)     Body mass index is 37.87 kg/m.  GENERAL APPEARANCE:   The patient is obese and in no respiratory distress.   HEENT:   PERRL.  Conjunctivae unremarkable.   Nasal mucosa is without epistaxis, exudate, or polyps.  Gums and dentition are unremarkable.  Mallampati 1    NECK/LYMPHATIC:   Symmetrical with no elevation of jugular venous pulsation.  Trachea midline. No thyroid enlargement.  No cervical adenopathy.   LUNGS:   Normal respiratory effort with symmetrical lung expansion.   Breath sounds clear bilaterally.   HEART:   There is a regular rate and rhythm.  No murmur, rub, or gallop.  There is + edema in the lower extremities.   ABDOMEN:   Soft and non-tender.  No hepatosplenomegaly.  Bowel sounds are normal.     SKIN:   There are no rashes, cyanosis, jaundice, or ecchymosis present.   EXTREMITIES:   The extremities are unremarkable without clubbing, cyanosis, joint inflammation, degenerative, or ischemic change.   MUSCULOSKELETAL:   There is no abnormal tone, muscle atrophy, or abnormal movement present.   NEURO:   The patient is alert and oriented to person, place, and time.  Memory appears intact and mood is normal.  No gross sensorimotor deficits are present.      DIAGNOSTIC TESTS:  None today     ASSESSMENT:  (Medical Decision Making)        Diagnosis Orders   1. OSA (obstructive sleep apnea)  DME - DURABLE MEDICAL EQUIPMENT   patient demonstrates excellent compliance on therapy. AHI is 1.7 events per hour. Will renew supplies. He is interested in a travel CPAP and the order will be provided for him.    We do not have a  copy of his sleep study but working to obtain this. If unable to do so, proceed with home sleep test as diagnostic study    The pathophysiology of obstructive sleep apnea was reviewed with the patient.  It's potential to promote severe neurologic, cardiac, pulmonary, and gastrointestinal problems was discussed. Specifically, the increased incidence of hypertension, coronary artery disease, congestive heart failure, pulmonary hypertension, gastroesophageal reflux, pathologic hypersomnolence, memory loss, and glucose intolerance was related to the consequences of hypoxemia, hypercapnia, airway obstruction, and sympathetic overdrive.  We also discussed the ability of nasal CPAP to correct these abnormalities through maintenance of a patent airway.  Therapeutic options including surgery, oral appliances, and weight loss were also reviewed.   Ambulatory Referral to Sleep Studies    DME - DURABLE MEDICAL EQUIPMENT      2. Persistent disorder of initiating or maintaining sleep  zolpidem (AMBIEN) 10 MG tablet   Patient takes [place] 10mg  1-2 x per week. Will give rx. He states 90 tablets will last almost a year   Ambulatory Referral to Sleep Studies    DME - DURABLE MEDICAL EQUIPMENT      3. Nocturnal hypoxemia  Ambulatory Referral to Sleep Studies   Will ensure this is adequate given LE edema. No evidence of polycythemia on labs to suggest chronic hypoxemia.  DME - DURABLE MEDICAL EQUIPMENT             PLAN:  Continue CPAP 11 cm  Renew supplies. RX provided for mini cpap.  Continue nightly compliance  HST if unable to obtain diagnostic study    ONO on cpap 11 cm    Ambien refilled    Follow up will be in 1 year or sooner if needed          Orders Placed This Encounter   Procedures  Ambulatory Referral to Sleep Studies     Referral Priority:   Routine     Referral Type:   Consult for Advice and Opinion     Referral Reason:   Specialty Services Required     Number of Visits Requested:   1    DME - DURABLE MEDICAL EQUIPMENT      [facility]  Phone: [address]  [place] [id number]  Dept: [phone]      Patient Name: Charlann Boxeraul Mccroskey  DOB: 12/27/1955  Gender: male  Address: [address]  [place] [id number]  Patient phone number: [phone] (home)       Primary Insurance: Payor: MEDICARE / Plan: MEDICARE PART A AND B / Product Type: *No Product type* /   Subscriber ID: [id number] - (Medicare)      AMB Supply Order  Order Details     DME Location:    Order Date: 07/20/2021   The encounter diagnosis was Persistent disorder of initiating or maintaining sleep.          (  X   )New Set-Up     Mini travel cpap machine   (     )E0601 CPAP Unit  (   x  )[id number] Auto CPAP Unit, mini travel   (     )E0470 BiLevel Unit  (     )E0470 Auto BiLevel Unit  (     )E0471 ASV   (     )E0471 Bilevel ST    (     )E1390 Oxygen Concentrator         Length of need: 12 months    Pressure: 11  cmH20  EPR:      Starting Ramp Pressure:   cm H20  Ramp Time: min      Patient had a diagnostic Apnea Hypopnea Index (AHI) of :      *SUPPLIES* Replace all as needed, or per coverage guidelines     Masks Type:    (     ) A7030-Full Face Mask (1 per 3 mon)  (     ) A7031-Full Mask (1 per month) Interface/Cushion      (   x  ) A7034-Nasal Mask (1 per 3 mon)  (   x  ) [id number]- Nasal Mask (1 per month) Interface/Cushion  (   x  ) A7033-Pillow (2 per mon)  (   x  ) A7036-Chinstrap (1 per 6 mon)      _________________________________________________________________          Other Supplies:    (  X   )A7035-Headgear (1 per 6 mon)  ( X    )A4604-Heated Tubing (1 per 3 mon)  (  X   )[id number]- Disposable Filter (2 per mon)  (   X  )E0562-Heated Humidifier (1 per year)     (  x   )A7036-Chinstrap (sometimes used with Full Face Mask) (1 per 6 mos)  (     )A7037-Tubing-without heat (1 per 3 mos)  ( X   )A7039-Non-Disposable Filter (1 per 6 mos)  (   x  )A7046-Water Chamber (1 per 6 mos)  (     )E0561-Humidifier non-heated (1 per 5 yrs)      Signed  Date: 07/20/2021  Electronically Signed By: Richardson LandryLacey A Hagen Bohorquez, APRN - CNP    DME - DURABLE MEDICAL EQUIPMENT     [facility]  Phone: [address]  [place] [address]  Dept: [phone]      Patient Name: Jameek Bruntz  DOB: 1955/09/26  Gender: male  Address: [address]  [place] [address]   Patient phone: [phone] (home)       Primary Insurance: Payor: MEDICARE / Plan: MEDICARE PART A AND B / Product Type: *No Product type* /   Subscriber ID: [id number] - (Medicare)      AMB Supply Order  Order Details     DME Location:   Order Date: 07/20/2021   The primary encounter diagnosis was OSA (obstructive sleep apnea). Diagnoses of Persistent disorder of initiating or maintaining sleep and Nocturnal hypoxemia were also pertinent to this visit.             (  X   )Supplies Needed       cpap Machine   (     [phone] CPAP Unit  (     [phone] Auto CPAP Unit  (     )E0470 BiLevel Unit  (     )E0470 Auto BiLevel Unit  (     )E0471 ASV        (     )E0471 Bilevel ST      Length of need: 12 months    Pressure:  11 cmH20  EPR:     Starting Ramp Pressure:   cm H20  Ramp Time: min        Patient had a diagnostic Apnea Hypopnea Index (AHI) of :    *SUPPLIES* Replace all as needed, or per coverage guidelines     Masks Type:  (    ) A7030-Full Face Mask (1 per 3 mon)  (    ) A7031-Full Mask (1 per month) Interface/Cushion      ( x ) A7034-Nasal Mask (1 per 3 mon)  ( x  ) D6644- Nasal Mask (1 per month) Interface/Cushion  (  x   ) A7033-Pillow (2 per mon)  (     ) A7036-Chinstrap (1 per 6 mon)            Other Supplies:    (   X  )A7035-Headgear (1 per 6 mon)  (   X  )A4604-Heated Tubing (1 per 3 mon)  (   X  )I3474- Disposable Filter (2 per mon)  (   x  )E0562-Heated Humidifier (1 per year)     (  x   )A7036-Chinstrap (sometimes used with Full Face Mask) (1 per 6 mos)  (    )A7037-Tubing-without heat (1 per 3 mos)  (     )A7039-Non-Disposable Filter (1 per 6 mos)  (  x   )A7046-Water  Chamber (1 per 6 mos)  (     )E0561-Humidifier non-heated (1 per 5 yrs)      Signed Date: 07/20/2021  Electronically Signed By: Richardson Landry, APRN - CNP  Electronically Dated:  07/20/2021     Orders Placed This Encounter   Medications    zolpidem (AMBIEN) 10 MG tablet     Sig: Take 1 tablet by mouth nightly as needed for Sleep for up to 180 days. Max Daily Amount: 10 mg     Dispense:  90 tablet     Refill:  0          Collaborating Physician: Dr. Lona Kettle     Over 50% of today's office visit was spent in  face to face time reviewing test results, prognosis, importance of compliance, education about disease process, benefits of medications, instructions for management of acute flare-ups, and follow up plans.  Total face to face time spent with patient was 35 minutes.    Maryfrances Bunnell, APRN - CNP  Electronically signed

## 2021-07-20 NOTE — Patient Instructions (Addendum)
Continue CPAP 11 cm  Renew supplies to [facility]  Check overnight oximetry on pap therapy    Will work to obtain copies of study from Dr. Dorna Bloom office. If not able to do so, proceed with home sleep test.     Refill ambien     Follow up will be in 1 year or sooner if needed     The company who will be taking care of your CPAP supplies is:     Address: [address] [place], [place] [address]  Phone: [phone]  Fax: [phone]        You will get a call from [facility] to schedule your at-home sleep study.     The day of your sleep study, you will go to [employer] [facility] ([address]rd floor, [address]). There you will be given the device and taught how to use the device.   You will go home, sleep that night with the home sleep test, and bring the device back the next day.    A physician will read the study and you will receive a call from our office with results or to schedule a follow-up appointment with the Sleep Center to discuss treatment options.     Overnight Oximetry is a device with a watch and a finger probe that you wear when you sleep. It measures your heart rate and the amount of oxygen in your blood. This test records the data, return the device back to your medical equipment company, and the data is sent to our office for review. You will be called with the results.

## 2021-07-20 NOTE — Progress Notes (Signed)
Medicare Annual Wellness Visit    Chad Rosario is here for Follow-up (Coming in to go over lab results, would like to talk to you about his legs and feet to see what your thoughts are.) and Medicare AWV    Assessment & Plan   Polyneuropathy associated with underlying disease (HCC)  -     [facility] (NCS/Nerve Conduction)  Back pain with history of spinal surgery  -     [facility] (NCS/Nerve Conduction)  Thrombocytopenia (HCC)  -     US ABDOMEN COMPLETE; Future  -     CBC with Auto Differential; Future  Splenic sequestration  -     US ABDOMEN COMPLETE; Future  -     CBC with Auto Differential; Future  Abnormal glucose  -     Hemoglobin A1C; Future  Elevated AST (SGOT)  -     AST; Future    Recommendations for Preventive Services Due: see orders and patient instructions/AVS.  Recommended screening schedule for the next 5-10 years is provided to the patient in written form: see Patient Instructions/AVS.     No follow-ups on file.     Subjective   The following acute and/or chronic problems were also addressed today:  BACK PAIN ABNORMAL LABS    Patient's complete Health Risk Assessment and screening values have been reviewed and are found in Flowsheets. The following problems were reviewed today and where indicated follow up appointments were made and/or referrals ordered.    Positive Risk Factor Screenings with Interventions:    Fall Risk:  Do you feel unsteady or are you worried about falling? : (!) yes  2 or more falls in past year?: no  Fall with injury in past year?: no     Interventions:    Patient declines any further evaluation or treatment      Alcohol Screening:  Alcohol Use: Heavy Drinker    Frequency of Alcohol Consumption: 2-3 times a week    Average Number of Drinks: 3 or 4    Frequency of Binge Drinking: Less than monthly      AUDIT-C Score: 5   AUDIT Total Score: 5    Interpretation of AUDIT-C score:   3-7 indicates potential alcohol  risk.    8 or more is associated with harmful or hazardous drinking.   13 or more in women, and 15 or more in men, is likely to indicate alcohol dependence.    Interventions:  Patient declined any further intervention or treatment             General HRA Questions:  Select all that apply: (!) New or Increased Pain, New or Increased Fatigue    Pain Interventions:  Patient declined any further interventions or treatment    Fatigue Interventions:  Patient declined any further interventions or treatment       Weight and Activity:  Physical Activity: Insufficiently Active    Days of Exercise per Week: 3 days    Minutes of Exercise per Session: 30 min     On average, how many days per week do you engage in moderate to strenuous exercise (like a brisk walk)?: 3 days  Have you lost any weight without trying in the past 3 months?: No  Body mass index is 37.87 kg/m. (!) Abnormal    Obesity Interventions:  low carbohydrate diet            Vision Screen:  Do you have  difficulty driving, watching TV, or doing any of your daily activities because of your eyesight?: No  Have you had an eye exam within the past year?: (!) No  No results found.    Interventions:    PATIENT TO SET UP EYE APPT                      Objective   Vitals:    07/19/21 1119   BP: 120/76   Site: Left Upper Arm   Position: Sitting   Cuff Size: Large Adult   Pulse: 51   Temp: 98.2 F (36.8 C)   TempSrc: Skin   SpO2: 98%   Weight: 287 lb (130.2 kg)   Height: 6\' 1"  (1.854 m)      Body mass index is 37.87 kg/m.             Allergies   Allergen Reactions    Hydrocodone-Acetaminophen Anxiety and Itching    Losartan Shortness Of Breath    Amlodipine Other (See Comments)     Other reaction(s): Other (See Comments)  Fatigue   Fatigue       Rosuvastatin Other (See Comments)     Other reaction(s): Constipation  Fatigue, constipation  Other reaction(s): Fatigue    Atorvastatin Diarrhea    Ezetimibe      Other reaction(s): Constipation, Other (See  Comments)  Constipation and decreased urine output  Constipation and decreased urine output  Other reaction(s): Constipation, Urine output low       Prior to Visit Medications    Medication Sig Taking? Authorizing Provider   apixaban (ELIQUIS) 5 MG TABS tablet Take 1 tablet by mouth 2 times daily Yes , PA   gabapentin (NEURONTIN) 300 MG capsule Take 1 capsule by mouth in the morning, at noon, and at bedtime for 360 days. Yes Johnnette Gourd, MD   lisinopril (PRINIVIL;ZESTRIL) 20 MG tablet Take 1 tablet by mouth daily Yes [name], MD   colchicine (MITIGARE) 0.6 MG capsule Take 1 capsule by mouth as needed for Pain Yes [name], PA-C   Acetaminophen 500 MG CAPS Take 2 capsules by mouth as needed Yes Historical Provider, MD   allopurinol (ZYLOPRIM) 300 MG tablet TAKE 1 TABLET BY MOUTH  DAILY Yes Historical Provider, MD   Alpha-Lipoic Acid 200 MG CAPS Take 300 mg by mouth Yes Historical Provider, MD   amitriptyline (ELAVIL) 10 MG tablet nightly as needed Yes Historical Provider, MD   aspirin 81 MG EC tablet Take 1 tablet by mouth daily Yes Historical Provider, MD   azelastine (ASTELIN) 0.1 % nasal spray 1 spray by Nasal route as needed Yes Historical Provider, MD   fexofenadine (ALLEGRA) 180 MG tablet Take 1 tablet by mouth daily Yes Historical Provider, MD   zolpidem (AMBIEN) 10 MG tablet Take 1 tablet by mouth nightly as needed for Sleep for up to 180 days. Max Daily Amount: 10 mg  Lulu Riding, APRN - CNP       CareTeam (Including outside providers/suppliers regularly involved in providing care):   Patient Care Team:  Lance Sell, MD as PCP - General (Family Medicine)  [name], MD as PCP - Empaneled Provider  [name], MD as Hematology/Oncology (Hematology and Oncology)     Reviewed and updated this visit:  Tobacco  Allergies  Meds  Med Hx  Surg Hx  Soc Hx  Fam Hx  Alvino Blood, MD

## 2021-07-24 ENCOUNTER — Inpatient Hospital Stay: Admit: 2021-07-24 | Payer: MEDICARE | Primary: Family Medicine

## 2021-07-24 DIAGNOSIS — R162 Hepatomegaly with splenomegaly, not elsewhere classified: Secondary | ICD-10-CM

## 2021-07-24 DIAGNOSIS — D696 Thrombocytopenia, unspecified: Secondary | ICD-10-CM

## 2021-07-25 ENCOUNTER — Encounter

## 2021-07-27 NOTE — Telephone Encounter (Signed)
PT cx his appt for Monday 5/8.  Need to rs

## 2021-07-30 ENCOUNTER — Encounter: Payer: MEDICARE | Attending: Internal Medicine | Primary: Family Medicine

## 2021-07-30 ENCOUNTER — Other Ambulatory Visit: Payer: MEDICARE | Primary: Family Medicine

## 2021-08-02 ENCOUNTER — Ambulatory Visit: Admit: 2021-08-02 | Discharge: 2021-08-02 | Payer: MEDICARE | Primary: Family Medicine

## 2021-08-02 DIAGNOSIS — G629 Polyneuropathy, unspecified: Secondary | ICD-10-CM

## 2021-08-02 NOTE — Progress Notes (Incomplete)
[facility]  [id number]  [place] [id number]  Dept: [phone]        Physical Therapy Initial Assessment     Referring MD: Cameron Proudhompson, Brian M, MD  Diagnosis: No diagnosis found.   Therapy precautions:{precautions:43047}  Co-morbidities affecting plan of care: ***  Total Timed Procedure Codes: *** min, Total Time: *** min      CLINICAL DECISION MAKING/ASSESSMENT     Personal Factors/co-morbidities affecting POC (1-2 Medium/3+High): {personal factors:43038}   Problem List: (1-2 Low/ 3 Medium/ 4+ High) {Problem list:41879}    Clinical decision making: {clinical decision making:43040}  Prognosis: {rehab potential:43102}   Benefits and precautions of treatment explained to patient.    Charlann Boxeraul Mouser is a 66 y.o. male who presents to therapy today with ***.    PLAN OF CARE     Effective Dates: 08/02/2021 TO {POC END MVHQ:46962}ATE:43162}.    Frequency/Duration: {frequency:43050} for {POA Days:44360::"90"} Day(s)  Interventions  may include but are not limited to: {interventions:44570}    The referring physician has reviewed and approved this evaluation and plan of care as noted by the electronic signature attached to note.      GOALS      Long-term Goals:    ***      PERTINENT MEDICAL HISTORY     Past medical and surgical history:   Past Medical History:   Diagnosis Date    CAD (coronary artery disease) 2015    Hypertension     controlled with meds    Neuropathy May 2019    Sleep apnea     cpap at night      Past Surgical History:   Procedure Laterality Date    BACK SURGERY  2021    has had 3 different ones last was in 2021    CARDIAC SURGERY      CABG    CHOLECYSTECTOMY      COLONOSCOPY N/A 11/29/2020    COLONOSCOPY POLYPECTOMY HOT SNARE performed by Drucie Opitzhody Fawaz[facility]    EYE SURGERY Left     cataract    FRACTURE SURGERY Left     tib-fib fracture    TOTAL HIP ARTHROPLASTY Left      Medications: reviewed in chart   Allergies:   Allergies   Allergen Reactions     Hydrocodone-Acetaminophen Anxiety and Itching    Losartan Shortness Of Breath    Amlodipine Other (See Comments)     Other reaction(s): Other (See Comments)  Fatigue   Fatigue       Rosuvastatin Other (See Comments)     Other reaction(s): Constipation  Fatigue, constipation  Other reaction(s): Fatigue    Atorvastatin Diarrhea    Ezetimibe      Other reaction(s): Constipation, Other (See Comments)  Constipation and decreased urine output  Constipation and decreased urine output  Other reaction(s): Constipation, Urine output low          SUBJECTIVE     Chief complaints/history of injury: Pt reporting L lower leg fracture in 2007 that resulted in compartment syndrome.  Had foot drop afterwards.  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    Had back surgery in 2015, fusion(L3-5).  Noticed bilateral neuropathy in feet.    Had larger spinal fusion in 2021 due to returning pain, stenosis, arthritic changes and stenosis.  Has noticed increased neuropathy afterward and limited flexibility.  Says he broke some of the hardware and had to have revision.  Pt reports he has built a new house and was fixing some hardware on the cabinets and felt a pop when bending forward.  Had some pain initially and had f/u with ortho that revealed he broke a screw but pain has resolved and surgery is not recommended unless pain returns.    Says she participated in PT earlier this year for subscapular shoulder pain but this has improved some.    Pain Assessment:  Denies any pain    Social/Functional Hx:  How would you rate your overall health? good  Pt lives with independent spouse in a(n) 1 story house with  1-step to enter with wider doors, walk-in shower .   Current DME: straight cane and reports having extensive list of other DME following spinal surgeries  Work Status: Retired   Sleep: normal    CLOF PLOF   ADLs: increased difficulty with self-care after toileting d/t spinal flexibility ADLs: --   Gait: says he can walk without a.d. but  says uneven terrain is difficult; uses SPC for community ambulation; no a.d. used at home; reports L foot drop and R foot is getting lazy; reports posterior imbalance with reaching overhead Gait: --   Bed mobility/Transfer: indep with transfers, limited by spinal inflexibility Bed Mobility/Transfers: --   Exercise: performs UE strengthening 2x/week; walks 1-2x/week Exercise: --   Occupation / Hobbies: currently spending a lot of time working on new house; watching television; has not played golf in >2 years; reports not good QOL Occupation / Hobbies: --       Patient Stated Goals: Be able to walk in the grass and perhaps be able to return to playing golf    OBJECTIVE   HAND/SIDE DOMINANCE: right handed    SENSATION:    Light touch:   Right Left   Diminished below knees Diminished below knees      Proprioception:  Right Left   intact Diminished into end range PF         SKIN INTEGRITY: wears compression socks    NEUROMUSCULAR/MOTOR:  MUSCLE TONE: Not assessed    REFLEXES: {TJG_reflexes:44218}    COORDINATION:    Right Left   Heel to shin test     Finger-to-nose test     Unanchored heel-toe test     LE Square tracing     Finger tapping (MDS-UPDRS)     Hand movements (MDS-UPDRS)      Toe tapping (MDS-UPDRS)     Leg agility (MDS-UPDRS)     Comments:        VISION:   Pursuits: appropriate  Saccades: appropriate  VOR: appropriate    POSTURE: Forward Head Rounded Shoulders      EXTREMITIES STRENGTH / ROM:  LE STRENGTH/ROM:    Strength PROM (deg)   Action Right Left Right Left   Hip Flex 5 5     Hip Ext       Hip Abd 5 5     Hip Add 5 5     Knee Flexion 5 5     Knee Ext. 5 5     Ankle DF 2+ 2+ (-12/-2) (-15/0)   Ankle PF 3 3     Ankle Inv 3 3+     Ankle Ev 3 3+     **Ankle DF ROM represented as knee (extended/flexed)    UE STRENGTH/ROM:   {TJG_UE strength:44220}      TRANSFERS AND BED MOBILITY:      Functional Status     Independent  Modified Indepen- dent   Stand-by Assist   Contact Guard   Minimal Assist   Moderate  Assist   Maximum Assist   Total Assist   Comments   Supine > L S/L []   []   []   []   []   []   []   []      Supine >R S/L []   []   []   []     []     []     []   []      Supine<>prone []   []   []   []   []   []   []   []      Supine > sit []   []   []   []   []   []   []   []      Sit > supine []   []   []   []   []   []   []   []      Sit > stand []   []   []   []   []   []   []   []      Stand > sit []   []   []   []   []   []   []   []      Bed > chair transfers []   []   []   []   []   []   []   []        Standardized Functional Mobility Assessments:  1. Five Time Sit to Stand Test:    Time Interpretation   31.86 seconds Cut off score of 16 seconds discriminates fallers from non-fallers  Estimated values for normal performance in community dwelling older adults ([place], 2006)  19-49 years: 4.1-11.5 seconds  50-59 years: 4.4-9.1 seconds  60-69 years: 4.0-15.1 seconds   70-79 years: 4.5-15.5 seconds  80-89 years: 7.8-16.0 seconds       BALANCE:       Balance   Good   Fair   Poor   Unable   Comments   Sitting static []   []   []   []      Sitting dynamic []   []   []   []      Standing static []   []   []   []      Standing dynamic []   []   []   []      Reaction Time []   []   []   []        Standardized Balance Assessments:  1. Mini-Balance Evaluations System Test (Mini-BESTest): {TJG_MiniBESTest:44223}  2. Activities-specific Balance Confidence(ABC) scale: {TJG_ABC:44234}  3. Modified Clinical Test of Sensory Interaction in Balance(CTSIB-M): {TJG_CTSIB-M:44224}  4. Berg Balance Scale(BBS): {TJG_BBS:44244}    GAIT /  MOBILITY:  Gait: ***    -Walking While Talking Test: *** with *** errors.    Stairs:  {stairs:40697}      Standardized Gait Assessments:  1. 10 Meter Walk Test ("gait speed"):   Self- selected Backward Fast Interpretation   *** m/sec *** m/sec *** m/sec Normal Values:       Self-Selected Velocity       Men Women      20s 1.36 1.34      30s 1.43 1.34      40s 1.43 1.39      50s 1.43 1.31      60s 1.33 1.24      70s 1.26 1.13      80s 0.97 0.94   **Parkinson's Disease MDC = SS -  0.18 m/sec, F - 0.25 m/sec. and Seney, 2008)  **Geriatric MCID = SS - 0.13 m/sec et al., 2006)  **Stroke MCID = SS - 0.14 m/sec et al., 2006)  **SCI MCID = SS -  0.15 m/sec, F - 0.25 m/sec (vanLoo and [place], 2004)  2. Timed Up and Go(TUG) test:   Basic - 14.89 seconds (Greater than 11.5 seconds associated with increased risk for falling in people living with PD - Nocera et al., 2011) (**Parkinson's Disease MDC = 4.85 points; Adelene Idler et al., 2011, PD)  Cognitive - *** seconds (Greater than 15 seconds associated with increased risk for falls in older adults - Shumway-Cook et al., 2000)  Manual -*** seconds (Greater than a 4.5 second difference between TUG manual and TUG basic indicates an increased risk for falls - Maranhao et al, 2011)  3. 6 minute walk test: {TJG_10 meter walk:44227}  4. Functional Gait Assessment: {TJG_FGA:44228}        PARKINSON'S SPECIFIC STANDARDIZED ASSESSMENTS:  1. Parkinson's Disease Questionnaire - 39 (PDQ-39): {TJG_PDQ39:44229}  2. Parkinson's Fatigue Scale - 16 (PFS-16): {TJG_PFS16:44231} (Higher scores associated with greater impact of fatigue on daily activities.)  3. Stages of Readiness for Change: {TJG_Physical Activity Stages of Change Questionnaire:44230}      Treatment provided today consisted of initial evaluation followed by:  Therapeutic exercise (97110) x *** min to address ROM/strength deficits and to develop an initial HEP as noted below.      COMMUNICATION/EDUCATION:   Educated *** on the roles/goals of outpatient neurological physical therapy with both verbalizing understanding and agreeable to proceeding.

## 2021-08-13 ENCOUNTER — Ambulatory Visit: Admit: 2021-08-13 | Discharge: 2021-08-13 | Payer: MEDICARE | Primary: Family Medicine

## 2021-08-13 DIAGNOSIS — G629 Polyneuropathy, unspecified: Secondary | ICD-10-CM

## 2021-08-15 ENCOUNTER — Other Ambulatory Visit: Payer: MEDICARE | Primary: Family Medicine

## 2021-08-15 ENCOUNTER — Ambulatory Visit: Admit: 2021-08-15 | Discharge: 2021-08-15 | Payer: MEDICARE | Attending: Internal Medicine | Primary: Family Medicine

## 2021-08-15 DIAGNOSIS — I82401 Acute embolism and thrombosis of unspecified deep veins of right lower extremity: Secondary | ICD-10-CM

## 2021-08-15 DIAGNOSIS — D696 Thrombocytopenia, unspecified: Secondary | ICD-10-CM

## 2021-08-15 LAB — CBC WITH AUTO DIFFERENTIAL
Absolute Immature Granulocyte: 0 10*3/uL (ref 0.0–0.5)
Basophils %: 1 % (ref 0.0–2.0)
Basophils Absolute: 0 10*3/uL (ref 0.0–0.2)
Eosinophils %: 10 % — ABNORMAL HIGH (ref 0.5–7.8)
Eosinophils Absolute: 0.9 10*3/uL — ABNORMAL HIGH (ref 0.0–0.8)
Hematocrit: 49.5 % (ref 41.1–50.3)
Hemoglobin: 17.1 g/dL (ref 13.6–17.2)
Immature Granulocytes: 0 % (ref 0.0–5.0)
Lymphocytes %: 17 % (ref 13–44)
Lymphocytes Absolute: 1.4 10*3/uL (ref 0.5–4.6)
MCH: 30.2 PG (ref 26.1–32.9)
MCHC: 34.5 g/dL (ref 31.4–35.0)
MCV: 87.5 FL (ref 82.0–102.0)
MPV: 13 FL — ABNORMAL HIGH (ref 9.4–12.3)
Monocytes %: 8 % (ref 4.0–12.0)
Monocytes Absolute: 0.6 10*3/uL (ref 0.1–1.3)
Neutrophils %: 64 % (ref 43–78)
Neutrophils Absolute: 5.2 10*3/uL (ref 1.7–8.2)
Platelets: 61 10*3/uL — ABNORMAL LOW (ref 150–450)
RBC: 5.66 M/uL — ABNORMAL HIGH (ref 4.23–5.6)
RDW: 16.1 % — ABNORMAL HIGH (ref 11.9–14.6)
WBC: 8.2 10*3/uL (ref 4.3–11.1)
nRBC: 0 10*3/uL (ref 0.0–0.2)

## 2021-08-15 LAB — COMPREHENSIVE METABOLIC PANEL
ALT: 49 U/L (ref 12–65)
AST: 32 U/L (ref 15–37)
Albumin/Globulin Ratio: 1.1 (ref 0.4–1.6)
Albumin: 3.9 g/dL (ref 3.2–4.6)
Alk Phosphatase: 61 U/L (ref 50–136)
Anion Gap: 4 mmol/L (ref 2–11)
BUN: 17 MG/DL (ref 8–23)
CO2: 29 mmol/L (ref 21–32)
Calcium: 9.1 MG/DL (ref 8.3–10.4)
Chloride: 105 mmol/L (ref 101–110)
Creatinine: 1.2 MG/DL (ref 0.8–1.5)
Est, Glom Filt Rate: >60 mL/min/{1.73_m2} (ref >60)
Globulin: 3.7 g/dL (ref 2.8–4.5)
Glucose: 116 mg/dL — ABNORMAL HIGH (ref 65–100)
Potassium: 4.6 mmol/L (ref 3.5–5.1)
Sodium: 138 mmol/L (ref 133–143)
Total Bilirubin: 0.9 MG/DL (ref 0.2–1.1)
Total Protein: 7.6 g/dL (ref 6.3–8.2)

## 2021-08-15 NOTE — Patient Instructions (Addendum)
Patient Instructions from Today's Visit    Reason for Visit:  Follow up-DVT    Diagnosis Information:  https://www.cancer.net/about-us/asco-answers-patient-education-materials/asco-answers-fact-sheets      Plan:  Your clots have been controlled at this point.  Your platelet count has been about the same and we can continue to watch this.  We do want to check your labs every 6 weeks  We will refer you to a gastroenterologist.  We do not need to do a bone marrow biopsy at this time.  Follow Up:  Dr Deatra Canter in 3 months    Recent Lab Results:  Hospital Outpatient Visit on 08/15/2021   Component Date Value Ref Range Status    WBC 08/15/2021 8.2  4.3 - 11.1 K/uL Final    RESULTS CHECKED X 2    RBC 08/15/2021 5.66 (H)  4.23 - 5.6 M/uL Final    Hemoglobin 08/15/2021 17.1  13.6 - 17.2 g/dL Final    Hematocrit 08/15/2021 49.5  41.1 - 50.3 % Final    MCV 08/15/2021 87.5  82.0 - 102.0 FL Final    MCH 08/15/2021 30.2  26.1 - 32.9 PG Final    MCHC 08/15/2021 34.5  31.4 - 35.0 g/dL Final    RDW 08/15/2021 16.1 (H)  11.9 - 14.6 % Final    Platelets 08/15/2021 61 (L)  150 - 450 K/uL Final    MPV 08/15/2021 13.0 (H)  9.4 - 12.3 FL Final    nRBC 08/15/2021 0.00  0.0 - 0.2 K/uL Final    **Note: Absolute NRBC parameter is now reported with Hemogram**    Differential Type 08/15/2021 AUTOMATED    Final    Neutrophils % 08/15/2021 64  43 - 78 % Final    Lymphocytes % 08/15/2021 17  13 - 44 % Final    Monocytes % 08/15/2021 8  4.0 - 12.0 % Final    Eosinophils % 08/15/2021 10 (H)  0.5 - 7.8 % Final    Basophils % 08/15/2021 1  0.0 - 2.0 % Final    Immature Granulocytes 08/15/2021 0  0.0 - 5.0 % Final    Neutrophils Absolute 08/15/2021 5.2  1.7 - 8.2 K/UL Final    Lymphocytes Absolute 08/15/2021 1.4  0.5 - 4.6 K/UL Final    Monocytes Absolute 08/15/2021 0.6  0.1 - 1.3 K/UL Final    Eosinophils Absolute 08/15/2021 0.9 (H)  0.0 - 0.8 K/UL Final    Basophils Absolute 08/15/2021 0.0  0.0 - 0.2 K/UL Final    Absolute Immature Granulocyte 08/15/2021  0.0  0.0 - 0.5 K/UL Final         Treatment Summary has been discussed and given to patient: n/a        -------------------------------------------------------------------------------------------------------------------  Please call our office at [phone] if you have any  of the following symptoms:   Fever of 100.5 or greater  Chills  Shortness of breath  Swelling or pain in one leg    After office hours an answering service is available and will contact a provider for emergencies or if you are experiencing any of the above symptoms.    Patient does express an interest in My Chart.  My Chart log in information explained on the after visit summary printout at the [place] desk.    Larita Fife, MA

## 2021-08-15 NOTE — Progress Notes (Unsigned)
[facility]: Office Visit Established Patient    Reason for follow up:    H/o DVT  thrombocytopenia      Oncologic overview: per [facility] visit dated 07/07/19:    Secondary erythrocytosis   Hx of hemoglobin as high as 19   JAK2 negative; splenomegaly - mild   Bone marrow consistent with secondary erythrocytosis 7/16; JAK2, CALr, MPL on marrow negative    Splenomegaly with thrombocytopenia   Negative bone marrow 8/16   Hepatitis panel pending 12/16    DVT RLE 7/17   There was not enough blood to do the complete antiphospholipid testing. The lupus anticoagulant was positive which may be artifactual related to the presence of xarelto. Both factor V Leiden and the prothrombin gene mutation were absent        DIAGNOSIS     "BONE MARROW CORE BIOPSY, CLOT SECTION, ASPIRATE AND PERIPHERAL BLOOD   SMEAR:   - MILDLY HYPERCELLULAR MARROW WITH ERYTHROID HYPERPLASIA; SEE NOTE   - NO SIGNIFICANT MARROW FIBROSIS IDENTIFIED ON RETICULIN STAIN     NOTE: LABORATORY STUDIES ARE NOTABLE FOR AN ELEVATED ERYTHROPOIETIN   (28.2 MIU/ML, REF 2.6-18.5), AND NEGATIVE FOR MUTATIONS OF JAK2 V617F   ([facility]), JAK2 EXONS 12-14, CALRETICULIN, AND MPL. OVERALL, THE   FEATURES ARE MOST IN KEEPING WITH SECONDARY ERYTHROCYTOSIS.     THE CASE WAS DISCUSSED WITH DR. HINES AT 9:56 AM, 11/29/2014. 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     In June 2022, patient relocated to [place] from [place] to be closer to her children.  He has establish care with PCP and cardiology already.  He screening colonoscopy was  completed by Dr. Lajean Saver on 11/29/2020 with history of colon polyps.     Patient's presents today to discuss management of chronic anticoagulation.  He has been on anticoagulation for right lower extremity DVT diagnosed in July 2017.  He continues to take Xarelto 10 mg daily.  Based on history of coronary artery disease, he also takes aspirin 81 mg oral daily.     On evaluation today, he denies lower extremity pain or persistent swelling.  Right leg swells after a long drive.  He denies chest pain, unusual shortness of breath, nausea, vomiting, gum bleeds/nosebleeds/bloody stool/hematuria, abdominal pain, diarrhea or dysuria.  Denies fevers, drenching night sweats, early satiety, unintentional weight loss.  No falls, head trauma.     C/o mild macular rash on the dorsal aspect of left foot.  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        Feels well overall  Donated blood yesterday.  No new symptoms  Has gained weight  Denies unusual bleeding or bruising      Review of Systems:  14 point ROS was negative except as per HPI      ECOG PERFORMANCE STATUS - 0-Fully active, able to carry on all pre-disease performance without restriction.    Pain - /10. None/Minimal pain - not affecting QOL  Fatigue - No flowsheet data found.  Distress - No flowsheet data found.         Reviewed and updated this visit by provider:          Current Outpatient Medications   Medication Sig Dispense Refill    zolpidem (AMBIEN) 10 MG tablet Take 1 tablet by mouth nightly as needed for Sleep for up to 180 days. Max Daily Amount: 10 mg 90 tablet 0    apixaban (ELIQUIS) 5 MG TABS tablet Take 1 tablet by mouth 2 times daily 180 tablet 0    gabapentin (NEURONTIN) 300 MG capsule Take 1 capsule by mouth in the morning, at noon, and at bedtime for 360 days. 270 capsule 3    lisinopril (PRINIVIL;ZESTRIL) 20 MG tablet  Take 1 tablet by mouth daily 90 tablet 3    colchicine (MITIGARE) 0.6 MG capsule Take 1 capsule by mouth as needed for Pain 30 capsule 3    Acetaminophen 500 MG CAPS Take 2 capsules by mouth as needed      allopurinol (ZYLOPRIM) 300 MG tablet TAKE 1 TABLET BY MOUTH  DAILY      Alpha-Lipoic Acid 200 MG CAPS Take 300 mg by mouth      amitriptyline (ELAVIL) 10 MG tablet nightly as needed      aspirin 81 MG EC tablet Take 1 tablet by mouth daily      azelastine (ASTELIN) 0.1 % nasal spray 1 spray by Nasal route as needed      fexofenadine (ALLEGRA) 180 MG tablet Take 1 tablet by mouth daily       No current facility-administered medications for this visit.        OBJECTIVE:  There were no vitals taken for this visit.  Wt Readings from Last 3 Encounters:   07/20/21 287 lb (130.2 kg)   07/19/21 287 lb (130.2 kg)   05/21/21 285 lb 9.6 oz (129.5 kg)       Physical Exam:  Patient alert and oriented x 3, in no acute distress  Integumentary: No Pallor, no icterus  HEENT: moist mucous membranes, normal oropharynx  Lymph nodes: no cervical or axillary LAD  Cardiovascular:RRR, S1, S2 present, no m/r/g   Lungs: Clear to auscultation, no rales or wheezing, no accessory muscle use  Abdomen: Soft, and non-tender on palpation, no organomegaly, bowel sounds audible  Extremities: No peripheral edema, no joint swelling  Neurological: patient can follow commands and move all extremities    Labs:  Lab Results   Component Value Date    WBC 7.7 07/10/2021    HGB 16.7 07/10/2021    HCT 47.5 07/10/2021    MCV 86.2 07/10/2021    PLT 69 (A) 07/10/2021     Lab Results   Component Value Date    NEUTROABS 4.4 07/10/2021    LYMPHOPCT 20 07/10/2021    LYMPHSABS 1.5 07/10/2021    MONOPCT 10 07/10/2021    MONOSABS 0.8 07/10/2021    EOSABS 1.0 (H) 07/10/2021    BASOPCT 1 07/10/2021     Lab Results   Component Value Date    NA 135 07/10/2021    K 4.4 07/10/2021    CL 107 07/10/2021    CO2 25 07/10/2021    BUN 20 07/10/2021    CREATININE 1.10 07/10/2021     GLUCOSE 103 (H) 07/10/2021    CALCIUM 9.1 07/10/2021    PROT 7.5 07/10/2021    LABALBU 3.9 07/10/2021    BILITOT 1.0 07/10/2021  ALKPHOS 68 07/10/2021    AST 44 (H) 07/10/2021    ALT 52 07/10/2021    LABGLOM >60 07/10/2021    GFRAA >60 12/18/2020    GLOB 3.6 07/10/2021     Date Obtained:   11/29/2020   DIAGNOSIS        A:  "ASCENDING COLON POLYPS":  FRAGMENTS OF MIXED TUBULOVILLOUS   ADENOMA.        B:  "TRANSVERSE COLON POLYP":  FRAGMENTS OF MIXED TUBULOVILLOUS   ADENOMA.           sms/12/01/2020     Sign Out Date: 12/01/2020  J. Tory Emerald., M.D.             Imaging:  XR THORACIC SPINE (3 VIEWS)    Result Date: 01/31/2021  1. Extensive postsurgical change without definite acute abnormality. 2. Fractured sternal wires seen within the upper sternum.    XR LUMBAR SPINE (MIN 4 VIEWS)    Result Date: 01/31/2021  1. Extensive postsurgical change without definite acute abnormality. 2. Fractured sternal wires seen within the upper sternum.    12/25/20: IMPRESSION:  No evidence of deep venous thrombosis in either lower extremity      07/24/21:IMPRESSION: US abdomen  -Mild hepatomegaly with diffusely increased hepatic echogenicity, likely  reflecting hepatic steatosis.  -Splenomegaly. Calcifications within the spleen, possibly reflecting sequelae of  prior granulomatous disease.    Problems:  1. History of DVT (deep vein thrombosis), right lower extremity   -Long-term anticoagulation with Xarelto  -Chronic mild thrombocytopenia, without evidence of excessive bleeding-this has been worked up in the past.  He has had a bone marrow biopsy in 2017.  Historically, platelet counts have remained relatively stable and above 100.  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  -Seasonal allergies  -Hypertension  -Sleep disturbance    Drop in platelets below baseline on today's labs likely 2/2 donating blood.     PLAN:  Repeat CBC in one week.  Follow labs.   Repeat PSA.   Medications  reviewed  There is no current indication for bone marrow biopsy and we will likely proceed with observation at this time.  No intervention is needed as long as counts remain over 30,000. Reviewed indications to seek emergency medical care. Hold xarelto if plt count drops below 50,000 or abnormal bleeding occurs.    He will continue taking Xarelto 10 mg PO daily for h/o unprovoked RLE DVT. Needs close monitoring for bleeding complications in the setting of thrombocytopenia.    He will continue taking lisinopril, Allegra, and Ambien (as needed).      ROV with labs as scheduled.   All questions were answered to the best of my abilities.          Hector Shade, MD  [facility]  [address]  [place], [place] [address]  Office : [phone]  Fax : [phone]    Elements of this note have been dictated using speech recognition software. As a result, errors of speech recognition may have occurred.         OBJECTIVE:  There were no vitals taken for this visit.  Wt Readings from Last 3 Encounters:   07/20/21 287 lb (130.2 kg)   07/19/21 287 lb (130.2 kg)   05/21/21 285 lb 9.6 oz (129.5 kg)       Physical Exam:  Patient alert and oriented x 3, in no acute distress  Integumentary: No Pallor, no icterus  HEENT: *** mucous membranes, normal oropharynx  Lymph nodes: ***  Cardiovascular:RRR, S1, S2 present, no m/r/g   Lungs: Clear to auscultation, no rales or wheezing, no accessory muscle use  Abdomen: Soft, and non-tender on palpation, no organomegaly, bowel sounds audible  Extremities: No peripheral edema, no joint swelling  Neurological: patient can follow commands and move all extremities    Labs:  Lab Results   Component Value Date    WBC 7.7 07/10/2021    HGB 16.7 07/10/2021    HCT 47.5 07/10/2021    MCV 86.2 07/10/2021    PLT 69 (A) 07/10/2021     Lab Results   Component Value Date    NEUTROABS 4.4 07/10/2021    LYMPHOPCT 20 07/10/2021    LYMPHSABS 1.5 07/10/2021    MONOPCT 10 07/10/2021     MONOSABS 0.8 07/10/2021    EOSABS 1.0 (H) 07/10/2021    BASOPCT 1 07/10/2021     Lab Results   Component Value Date    NA 135 07/10/2021    K 4.4 07/10/2021    CL 107 07/10/2021    CO2 25 07/10/2021    BUN 20 07/10/2021    CREATININE 1.10 07/10/2021    GLUCOSE 103 (H) 07/10/2021    CALCIUM 9.1 07/10/2021    PROT 7.5 07/10/2021    LABALBU 3.9 07/10/2021    BILITOT 1.0 07/10/2021    ALKPHOS 68 07/10/2021    AST 44 (H) 07/10/2021    ALT 52 07/10/2021    LABGLOM >60 07/10/2021    GFRAA >60 12/18/2020    GLOB 3.6 07/10/2021               Imaging:  US ABDOMEN COMPLETE    Result Date: 07/24/2021  -Mild hepatomegaly with diffusely increased hepatic echogenicity, likely reflecting hepatic steatosis. -Splenomegaly. Calcifications within the spleen, possibly reflecting sequelae of prior granulomatous disease.         Problems:  No diagnosis found.    ***        PLAN:  ***           Hector Shade, MD  [facility]  [address]  [place], [place] [address]  Office : [phone]  Fax : [phone]    Elements of this note have been dictated using speech recognition software. As a result, errors of speech recognition may have occurred.

## 2021-08-17 NOTE — Progress Notes (Signed)
Physical Therapy Daily Note     Referring MD: Cameron Proud, MD  Diagnosis:     ICD-10-CM    1. 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 Gait abnormality  R26.9       3. At high risk for falls  Z91.81       4. Generalized weakness  R53.1       5. Unsteadiness on feet  R26.81       6. Impaired transfers  Z74.09          Therapy precautions:Fall risk and Gait belt for dynamic standing activity  Co-morbidities affecting plan of care: h/o back surgery  Total Timed Procedure Codes: 25 min, Total Time: 55 min  Visit Count: 2                                          Visits since last evaluative note: 1  POC Expiration: 10/07/2021    ASSESSMENT:     First half of visit spent establish HEP for gastroc stretching, core stability training and standing balance training to improve hip strategy over impaired ankle strategies. Overall he did well however he did have difficulty isolating his hip strategy with tandem and semi-tandem stance activities and this may warrant revisiting it. An orthotist from [facility] was present at vist to discuss AFO options for his foot drop and slap.  Able to decide upon bilateral carbon fiber AFOs with anterior shell.  Will likely take several weeks before pt is able to obtain this and at that point it would be good to return to PT for additional balance training but also gait training with AFOs.     PLAN OF CARE:   PLAN FOR FUTURE VISITS:   Assess fit and function of new bilateral AFO's once received and provide gait training to ensure safety in ambulation when using them.     SUBJECTIVE:     Pt reporting 3/10 lower back pain at start of visit.  Denies any falls.  Purchased resting night splint however says because of the limited ROM in his spine, he had quite a bit of difficulty donning/doffing it therefore he returned it.     OBJECTIVE:     THERAPEUTIC EXERCISE: (25 minutes):    Standing unilateral gastroc stretch on 3" step, 2 x 1:00 each LE  Core stability training:  Hooklying TAC with 3  count hold x 10 reps  Hooklying TAC with alternating LE marching, 2 x 10 reps each LE  Hooklying TAC with alternating LE marching from 90/90 position, 2 x 12 reps.  Standing tandem balance x multiple attempts each LE advanced -->semi-tandem stance, 2 x 1:00 each LE.     **Last 30 minutes of visit spent with orthotist discussing bracing options and assisting pt in deciding upon bilateral AFOs.    PATIENT EDUCATION:     Issued above exercises as a HEP and discussed with pt to return to PT once he acquires new AFOs to provided gait training and ensure appropriate fit.

## 2021-08-21 ENCOUNTER — Telehealth

## 2021-08-21 NOTE — Telephone Encounter (Signed)
BILAFOs

## 2021-08-22 ENCOUNTER — Ambulatory Visit: Admit: 2021-08-22 | Discharge: 2021-08-22 | Payer: MEDICARE | Attending: Family Medicine | Primary: Family Medicine

## 2021-08-22 DIAGNOSIS — T753XXA Motion sickness, initial encounter: Secondary | ICD-10-CM

## 2021-08-22 LAB — AMB POC HEMOGLOBIN A1C: Hemoglobin A1C, POC: 5.2 %

## 2021-08-22 MED ORDER — SCOPOLAMINE 1 MG/3DAYS TD PT72
1 MG/3DAYS | MEDICATED_PATCH | TRANSDERMAL | 1 refills | Status: DC
Start: 2021-08-22 — End: 2021-10-18

## 2021-08-23 NOTE — Progress Notes (Signed)
Chad Rosario  02/01/1956 is a 66 y.o. male ,Established patient, here for evaluation of the following chief complaint(s):   Chief Complaint   Patient presents with    1 Month Follow-Up     Coming in to follow up on platelets- would like to discuss his weight gain.           1. Sea sickness, initial encounter  -     scopolamine (TRANSDERM-SCOP) transdermal patch; Place 1 patch onto the skin every 72 hours, Disp-10 patch, R-1Normal  2. Abnormal glucose  -     AMB POC HEMOGLOBIN A1C    AIC 5.2    THROMBOCYTOPENIA----FOLLOWED PER HEMATOLOGY ; 61 K stable    Return in about 3 months (around 11/22/2021).        Subjective   SUBJECTIVE/OBJECTIVE:  Patient going on cruise---needs patches    Ultrasound---fatty liver ; splenic calcifications.    Vascular to do procedure to open arteries.    He has seen GI and Hematology regarding platelets.    Hypertension  This is a chronic problem. The current episode started more than 1 year ago. The problem has been rapidly improving since onset. The problem is controlled. Pertinent negatives include no anxiety, blurred vision, chest pain, headaches, palpitations, peripheral edema, PND, shortness of breath or sweats.   Diabetes  He presents for his follow-up diabetic visit. He has type 2 diabetes mellitus. His disease course has been improving. Pertinent negatives for hypoglycemia include no confusion, headaches, pallor, speech difficulty or sweats. Pertinent negatives for diabetes include no blurred vision, no chest pain and no weakness.     Review of Systems   Constitutional:  Negative for chills and fever.   HENT:  Negative for ear pain, hearing loss and sore throat.    Eyes:  Negative for blurred vision, photophobia and pain.   Respiratory:  Negative for cough and shortness of breath.    Cardiovascular:  Negative for chest pain, palpitations, leg swelling and PND.   Gastrointestinal:  Negative for abdominal distention, abdominal pain, blood in stool and nausea.   Genitourinary:   Negative for dysuria and urgency.   Musculoskeletal:  Negative for joint swelling and myalgias.   Skin:  Negative for color change, pallor and rash.   Neurological:  Negative for speech difficulty, weakness, light-headedness and headaches.   Hematological:  Negative for adenopathy.   Psychiatric/Behavioral:  Negative for agitation, confusion, hallucinations, self-injury and suicidal ideas.         Objective   Physical Exam  Constitutional:       Appearance: Normal appearance.   HENT:      Head: Normocephalic and atraumatic.      Nose: Nose normal.   Eyes:      Extraocular Movements: Extraocular movements intact.      Conjunctiva/sclera: Conjunctivae normal.      Pupils: Pupils are equal, round, and reactive to light.   Cardiovascular:      Rate and Rhythm: Normal rate and regular rhythm.      Pulses: Normal pulses.      Heart sounds: Normal heart sounds.   Pulmonary:      Effort: Pulmonary effort is normal.      Breath sounds: Normal breath sounds.   Abdominal:      General: Abdomen is flat. Bowel sounds are normal.      Palpations: Abdomen is soft.   Skin:     General: Skin is warm and dry.   Neurological:  General: No focal deficit present.      Mental Status: He is alert and oriented to person, place, and time.   Psychiatric:         Mood and Affect: Mood normal.                 An electronic signature was used to authenticate this note.    --Alvino Blood, MD

## 2021-08-30 ENCOUNTER — Encounter: Payer: MEDICARE | Attending: Internal Medicine | Primary: Family Medicine

## 2021-08-30 ENCOUNTER — Other Ambulatory Visit: Payer: MEDICARE | Primary: Family Medicine

## 2021-09-06 ENCOUNTER — Encounter: Payer: MEDICARE | Primary: Family Medicine

## 2021-09-06 ENCOUNTER — Encounter: Payer: MEDICARE | Attending: Internal Medicine | Primary: Family Medicine

## 2021-09-19 MED ORDER — APIXABAN 5 MG PO TABS
5 MG | ORAL_TABLET | Freq: Two times a day (BID) | ORAL | 1 refills | Status: AC
Start: 2021-09-19 — End: 2022-03-18

## 2021-09-19 NOTE — Telephone Encounter (Signed)
This has been sent to [facility]

## 2021-09-19 NOTE — Telephone Encounter (Signed)
From: [name]  To: Dr. [name]  Sent: 09/19/2021 11:38 AM EDT  Subject: Marijean Heath     My vascular surgeon ( not in [facility]) prescribed me Eliquest 5mg  2 times daily. Replacing Xeralto.     I need a refill and they asked me to reach out to my primary physician.     Please send to [facility].     If you can't help or need additional information please let me know.

## 2021-09-20 ENCOUNTER — Ambulatory Visit: Admit: 2021-09-20 | Discharge: 2021-09-20 | Payer: MEDICARE | Attending: Rheumatology | Primary: Family Medicine

## 2021-09-20 ENCOUNTER — Encounter: Payer: MEDICARE | Attending: Rheumatology | Primary: Family Medicine

## 2021-09-20 DIAGNOSIS — I10 Essential (primary) hypertension: Secondary | ICD-10-CM

## 2021-09-20 MED ORDER — PREDNISONE 10 MG PO TABS
10 MG | ORAL_TABLET | ORAL | 0 refills | Status: AC
Start: 2021-09-20 — End: 2022-05-03

## 2021-09-20 NOTE — Progress Notes (Addendum)
[facility]  C. Sheilah Pigeon, M.D.     Phone: [phone]   Fax: [phone]    09/20/2021 11:23 AM      SUBJECTIVE: Chad Rosario is a 66 y.o. male who is referred for gout: He has had a history to 10 years of fairly typical podagra apparently controlled on various medications including colchicine and has been on allopurinol for some time and last several uric acid's have been therapeutic below 6 mg percent.  I do not know what his uric acid was at its worst.  But overall the gout is well controlled at this point.    He has also had some right knee trouble that they thought was gout he did see an orthopedic surgeon and I think had an aspiration certainly had an injection would help for week but he did not have uric acid crystals to look for in the fluid.  He is scheduled to see another orthopedic surgeon next month.  And does cause him significant difficulty.  The orthopedic surgeon did a CT of the knee and he does have tricompartmental degenerative arthritic changes.    LAB:  08/15/2021: Glucose 116 otherwise unremarkable CMP.  CBC shows platelet count low at 61,000 and has been several previous low platelet count.  07/10/2021: Uric acid 5.3.  Platelet count is 69,000 otherwise fairly unremarkable CBC except for 13% eosinophils.  05/23/2021: Platelet count 88,000  04/13/2021 uric acid 5.8  11/02/2020 uric acid 6.1    XRAYS:      DXA:                       Patient Active Problem List   Diagnosis    Obesity (BMI 30-39.9)    History of DVT (deep vein thrombosis)    Hx of CABG    Hypertension    Hyperlipidemia    Chronic dyspnea    Thrombocytopenia, unspecified    Acute embolism and thrombosis of unspecified deep veins of right lower extremity (HCC)     Current Outpatient Medications   Medication Sig Dispense Refill    apixaban (ELIQUIS) 5 MG TABS tablet Take 1 tablet by mouth 2 times daily 180 tablet 1    celecoxib (CELEBREX) 200 MG capsule       scopolamine (TRANSDERM-SCOP) transdermal patch Place 1  patch onto the skin every 72 hours (Patient taking differently: Place 1 patch onto the skin every 72 hours JUST FOR CRUISE IN AUGUST) 10 patch 1    zolpidem (AMBIEN) 10 MG tablet Take 1 tablet by mouth nightly as needed for Sleep for up to 180 days. Max Daily Amount: 10 mg 90 tablet 0    gabapentin (NEURONTIN) 300 MG capsule Take 1 capsule by mouth in the morning, at noon, and at bedtime for 360 days. 270 capsule 3    lisinopril (PRINIVIL;ZESTRIL) 20 MG tablet Take 1 tablet by mouth daily 90 tablet 3    colchicine (MITIGARE) 0.6 MG capsule Take 1 capsule by mouth as needed for Pain 30 capsule 3    Acetaminophen 500 MG CAPS Take 2 capsules by mouth as needed      allopurinol (ZYLOPRIM) 300 MG tablet TAKE 1 TABLET BY MOUTH  DAILY      Alpha-Lipoic Acid 200 MG CAPS Take 300 mg by mouth      amitriptyline (ELAVIL) 10 MG tablet nightly as needed      aspirin 81 MG EC tablet Take 1 tablet by mouth daily  azelastine (ASTELIN) 0.1 % nasal spray 1 spray by Nasal route as needed      fexofenadine (ALLEGRA) 180 MG tablet Take 1 tablet by mouth daily       No current facility-administered medications for this visit.     Allergies   Allergen Reactions    Hydrocodone-Acetaminophen Anxiety and Itching    Losartan Shortness Of Breath    Amlodipine Other (See Comments)     Other reaction(s): Other (See Comments)  Fatigue   Fatigue       Rosuvastatin Other (See Comments)     Other reaction(s): Constipation  Fatigue, constipation  Other reaction(s): Fatigue    Atorvastatin Diarrhea    Ezetimibe      Other reaction(s): Constipation, Other (See Comments)  Constipation and decreased urine output  Constipation and decreased urine output  Other reaction(s): Constipation, Urine output low       Social History     Tobacco Use    Smoking status: Never    Smokeless tobacco: Never   Vaping Use    Vaping Use: Never used   Substance Use Topics    Alcohol use: Yes     Alcohol/week: 12.0 standard drinks     Types: 6 Cans of beer, 6 Shots of  liquor per week     Comment: socially    Drug use: Never     [phone] (home)   Past Surgical History:   Procedure Laterality Date    BACK SURGERY  2021    has had 3 different ones last was in 2021    CARDIAC SURGERY      CABG    CHOLECYSTECTOMY      COLONOSCOPY N/A 11/29/2020    COLONOSCOPY POLYPECTOMY HOT SNARE performed by Drucie Opitz, MD at [facility]    EYE SURGERY Left     cataract    FRACTURE SURGERY Left     tib-fib fracture    TOTAL HIP ARTHROPLASTY Left          ROS:    Gen.: no fever or significant change in appetite or weight    ENT: No ocular redness, ocular pain, photophobia or oral ulcerations. No unusual HA's/LOC.  No history of uveitis and no sicca symptoms    CV: Positive for coronary disease    PUL: No cough or shortness of breath or history of COPD.  No history of pleurisy    GI: No unusual dysphagia or dyspepsia.    GU: No history of nephrolithiasis or recurring UTIs    Mucocutaneous: no recurring rash, photosen., or oral ulcerations.  No Raynaud's or photosensitivity.  No personal or family history of psoriasis    Neuro:no paresthesias or hx of PN, seizures, TIA's or CVA's.    Heme./Oncology:  no hx anemia, thrombocytopenia, or other blood dyscrias. No hx cancer    Endo: No diabetes    Psy:    MSK: see PI      OBJECTIVE:    Ht 6\' 1"  (1.854 m)   Wt 288 lb (130.6 kg)   BMI 38.00 kg/m     Gen.: oriented, normal  affect, no distress, well kept, appropriately dressed.    ENT: Pupils equal.  No oral lesions and mucous membrane moisture content appears to be normal.  EOMs intact    Neck: No obvious thyromegaly felt to be present.  No carotid bruits noted.    Pulmonary: Lung fields clear with normal chest excursions.    Cardiovascular: Heart rhythm is regular with good  distal pulses.  No HJR or significant peripheral edema.  No gallop noted.    Abdominal: Soft with no palpable organs or masses and no tenderness.  No abdominal bruit noted.  Bowel sounds normal.    Lymphatics: No cervical,  supraclavicular, axillary epitrochlear adenopathy.  No significant inguinal adenopathy.      Mucocutaneous: No unusual oral lesions noted.  No unusual cutaneous findings in the upper or lower extremities or the trunk.  No history of Raynaud's or photosensitivity    MSK:    Upper Extremities: Shoulders elbows wrist and hands unremarkable    Lower extremities: Lower extremities other than the right knee unremarkable at this time the right knee is somewhat warm with a small effusion and limited motion          ASSESSMENT:        Well-controlled gout with uric acid below 6  Osteoarthritis of the right knee and scheduled for follow-up with a different orthopedic surgeon next month  No history of diabetes but is a history of coronary disease          PLAN:      No specific recommendation as his gout is well controlled.  I did call him in some prednisone to use as he is going on a cruise and was worried about his gout flaring.  He will follow-up with Dr. Oletha Blend in the future for his gout                No follow-up provider specified.      Williemae Natter, M.D.  Rheumatology - [facility]

## 2021-09-26 ENCOUNTER — Inpatient Hospital Stay: Admit: 2021-09-26 | Payer: MEDICARE | Primary: Family Medicine

## 2021-09-26 DIAGNOSIS — I82401 Acute embolism and thrombosis of unspecified deep veins of right lower extremity: Secondary | ICD-10-CM

## 2021-09-26 LAB — CBC WITH AUTO DIFFERENTIAL
Absolute Immature Granulocyte: 0 10*3/uL (ref 0.0–0.5)
Basophils %: 1 % (ref 0.0–2.0)
Basophils Absolute: 0.1 10*3/uL (ref 0.0–0.2)
Eosinophils %: 10 % — ABNORMAL HIGH (ref 0.5–7.8)
Eosinophils Absolute: 0.7 10*3/uL (ref 0.0–0.8)
Hematocrit: 49.2 % (ref 41.1–50.3)
Hemoglobin: 17.2 g/dL (ref 13.6–17.2)
Immature Granulocytes: 0 % (ref 0.0–5.0)
Lymphocytes %: 22 % (ref 13–44)
Lymphocytes Absolute: 1.5 10*3/uL (ref 0.5–4.6)
MCH: 30.8 PG (ref 26.1–32.9)
MCHC: 35 g/dL (ref 31.4–35.0)
MCV: 88 FL (ref 82.0–102.0)
MPV: 12.4 FL — ABNORMAL HIGH (ref 9.4–12.3)
Monocytes %: 9 % (ref 4.0–12.0)
Monocytes Absolute: 0.7 10*3/uL (ref 0.1–1.3)
Neutrophils %: 58 % (ref 43–78)
Neutrophils Absolute: 4.1 10*3/uL (ref 1.7–8.2)
Platelets: 89 10*3/uL — ABNORMAL LOW (ref 150–450)
RBC: 5.59 M/uL (ref 4.23–5.6)
RDW: 15.9 % — ABNORMAL HIGH (ref 11.9–14.6)
WBC: 7 10*3/uL (ref 4.3–11.1)
nRBC: 0 10*3/uL (ref 0.0–0.2)

## 2021-09-27 ENCOUNTER — Encounter: Payer: MEDICARE | Attending: Gastroenterology | Primary: Family Medicine

## 2021-09-27 NOTE — Progress Notes (Unsigned)
Chad Rosario (DOB:  [birth year]) is a 66 y.o. male,established patient here for evaluation of the following chief complaint(s):  No chief complaint on file.         ASSESSMENT/PLAN:  {There are no diagnoses linked to this encounter. (Refresh or delete this SmartLink)}         Subjective   SUBJECTIVE/OBJECTIVE  Per the patient work-up for thrombocytopenia he had an ultrasound of the abdomen showed hepatomegaly and increased steatosis.  His liver enzymes and synthetic function are within normal: ALT 49 alkaline phosphatase 61 total bilirubin 0.9 albumin 3.9.  His iron studies are normal.  INR is normal      Review of Systems  ***         Objective     Physical Exam         No follow-ups on file.      {Time Documentation Optional:[id number]}      An electronic signature was used to authenticate this note.    --Drucie Opitz, MD

## 2021-09-27 NOTE — Telephone Encounter (Signed)
6 week CBC reviewed, platelets stable at 89k. Okay to continue blood thinner. Patient notified. He has GI consult scheduled for tomorrow.

## 2021-10-09 ENCOUNTER — Inpatient Hospital Stay: Admit: 2021-10-09 | Payer: MEDICARE | Primary: Family Medicine

## 2021-10-09 DIAGNOSIS — G4733 Obstructive sleep apnea (adult) (pediatric): Secondary | ICD-10-CM

## 2021-10-18 ENCOUNTER — Ambulatory Visit: Admit: 2021-10-18 | Discharge: 2021-10-18 | Payer: MEDICARE | Attending: Neurology | Primary: Family Medicine

## 2021-10-18 DIAGNOSIS — R202 Paresthesia of skin: Secondary | ICD-10-CM

## 2021-10-18 NOTE — Progress Notes (Signed)
NEUROLOGY  RETURN  OFFICE VISIT ::                     10/18/2021  Chad Rosario is a 66 y.o. male here for :: Polyneuropathy/neuropathy lower and upper extremity diffuse.  Neuropathic pain.      Referred by   [name], MD     Chief Complaint:   Paresthesia of his limbs worse in the lowers.  For weakness.  Generalized.  Neuro pathic.  Bilateral foot drop.  History of radiculitis and multiple back surgeries x20 years.         66 year old right-handed white male relatively large with a BMI of 36.94.    He is on gabapentin at 1800 mg/day.  He had trouble previously with side effects from Lyrica/pregabalin.  Also side effects from Cymbalta.  Has not had a trial of baclofen.    Duloxetine and pregabalin side effects.    Active Problems:    * No active hospital problems. *  Resolved Problems:    * No resolved hospital problems. *    Past Medical History:   Diagnosis Date    CAD (coronary artery disease) 2015    Gout     Hypertension     controlled with meds    Neuropathy May 2019    Sleep apnea     cpap at night     Past Surgical History:   Procedure Laterality Date    BACK SURGERY  2021    has had 3 different ones last was in 2021    CARDIAC SURGERY      CABG    CHOLECYSTECTOMY      COLONOSCOPY N/A 11/29/2020    COLONOSCOPY POLYPECTOMY HOT SNARE performed by Drucie Opitz, MD at [facility] ENDOSCOPY    EYE SURGERY Left     cataract    FRACTURE SURGERY Left     tib-fib fracture    TOTAL HIP ARTHROPLASTY Left       Family History   Problem Relation Age of Onset    Cancer Mother         Lymp    Stroke Father     Cancer Sister         Breast     Social History     Socioeconomic History    Marital status: Married     Spouse name: None    Number of children: None    Years of education: None    Highest education level: None   Tobacco Use    Smoking status: Never    Smokeless tobacco: Never   Vaping Use    Vaping Use: Never used   Substance and Sexual Activity    Alcohol use: Yes     Alcohol/week: 12.0 standard drinks      Types: 6 Cans of beer, 6 Shots of liquor per week     Comment: socially    Drug use: Never    Sexual activity: Not Currently     Partners: Female     Social Determinants of Health     Financial Resource Strain: Low Risk     Difficulty of Paying Living Expenses: Not hard at all   Food Insecurity: No Food Insecurity    Worried About Programme researcher, broadcasting/film/video in the Last Year: Never true    Ran Out of Food in the Last Year: Never true   Transportation Needs: Unknown    Lack of Transportation (Non-Medical): No  Physical Activity: Insufficiently Active    Days of Exercise per Week: 3 days    Minutes of Exercise per Session: 30 min   Housing Stability: Unknown    Unstable Housing in the Last Year: No        Current Outpatient Medications   Medication Sig Dispense Refill    Multiple Vitamins-Minerals (THERAPEUTIC MULTIVITAMIN-MINERALS) tablet Take 1 tablet by mouth daily      vitamin D3 (CHOLECALCIFEROL) 10 MCG (400 UNIT) TABS tablet Take 2 tablets by mouth daily      CPAP Machine MISC by Does not apply route      predniSONE (DELTASONE) 10 MG tablet 2 tablets twice daily for 1 week, 1 tablet twice daily for 1 week, 1 tablet each morning for 1 week, 1/2 tablet each morning for 1 week then discontinue treatment 80 tablet 0    apixaban (ELIQUIS) 5 MG TABS tablet Take 1 tablet by mouth 2 times daily 180 tablet 1    celecoxib (CELEBREX) 200 MG capsule       zolpidem (AMBIEN) 10 MG tablet Take 1 tablet by mouth nightly as needed for Sleep for up to 180 days. Max Daily Amount: 10 mg 90 tablet 0    gabapentin (NEURONTIN) 300 MG capsule Take 1 capsule by mouth in the morning, at noon, and at bedtime for 360 days. 270 capsule 3    lisinopril (PRINIVIL;ZESTRIL) 20 MG tablet Take 1 tablet by mouth daily 90 tablet 3    colchicine (MITIGARE) 0.6 MG capsule Take 1 capsule by mouth as needed for Pain 30 capsule 3    Acetaminophen 500 MG CAPS Take 2 capsules by mouth as needed      allopurinol (ZYLOPRIM) 300 MG tablet TAKE 1 TABLET BY MOUTH   DAILY      Alpha-Lipoic Acid 200 MG CAPS Take 300 mg by mouth      amitriptyline (ELAVIL) 10 MG tablet nightly as needed      aspirin 81 MG EC tablet Take 1 tablet by mouth daily      azelastine (ASTELIN) 0.1 % nasal spray 1 spray by Nasal route as needed      fexofenadine (ALLEGRA) 180 MG tablet Take 1 tablet by mouth daily       No current facility-administered medications for this visit.         Allergies   Allergen Reactions    Hydrocodone-Acetaminophen Anxiety and Itching    Losartan Shortness Of Breath    Amlodipine Other (See Comments)     Other reaction(s): Other (See Comments)  Fatigue   Fatigue       Rosuvastatin Other (See Comments)     Other reaction(s): Constipation  Fatigue, constipation  Other reaction(s): Fatigue    Atorvastatin Diarrhea    Ezetimibe      Other reaction(s): Constipation, Other (See Comments)  Constipation and decreased urine output  Constipation and decreased urine output  Other reaction(s): Constipation, Urine output low         REVIEW OTHER RECORDS:    Review of Systems   Constitutional: Negative.    HENT: Negative.     Eyes: Negative.    Respiratory: Negative.     Cardiovascular: Negative.    Musculoskeletal:  Positive for myalgias (he frequently gets calf cramps - not really on banana or such. not doing stretch exercises.).   Skin: Negative.    Neurological:  Positive for dizziness (using cane.), tingling, sensory change, focal weakness and weakness (bilat foot drops.  distal weakness.).  Negative for tremors, speech change, seizures, loss of consciousness and headaches.   Psychiatric/Behavioral: Negative.     All other systems reviewed and are negative.           REVIEW IMAGING:     Objective:     Vitals:    10/18/21 0902   Pulse: 80   SpO2: 97%   Weight: 280 lb (127 kg)        Physical Exam  Vitals reviewed.   Constitutional:       General: He is awake. He is not in acute distress.     Appearance: He is well-developed and well-groomed. He is obese. He is not ill-appearing,  toxic-appearing or diaphoretic.   HENT:      Head: Normocephalic and atraumatic. No raccoon eyes, abrasion, contusion, right periorbital erythema, left periorbital erythema or laceration.      Right Ear: Hearing normal.      Left Ear: Hearing normal.   Eyes:      General: Lids are normal. Vision grossly intact. No scleral icterus.        Right eye: No discharge.         Left eye: No discharge.      Extraocular Movements: Extraocular movements intact.      Conjunctiva/sclera: Conjunctivae normal.      Pupils: Pupils are equal, round, and reactive to light.   Neck:      Trachea: Phonation normal.   Pulmonary:      Effort: Pulmonary effort is normal. No respiratory distress.      Breath sounds: No wheezing.   Musculoskeletal:         General: Deformity (left lateral leg.) present. No swelling or tenderness.      Cervical back: Normal range of motion. No rigidity or torticollis.   Skin:     General: Skin is warm and dry.      Capillary Refill: Capillary refill takes less than 2 seconds.      Coloration: Skin is not ashen, cyanotic, jaundiced or pale.      Nails: There is no clubbing.   Neurological:      Mental Status: He is alert and easily aroused. Mental status is at baseline.      Cranial Nerves: Cranial nerves 2-12 are intact. No cranial nerve deficit, dysarthria or facial asymmetry.      Sensory: Sensory deficit present.      Motor: Weakness, atrophy and abnormal muscle tone present. No tremor or seizure activity.      Coordination: Coordination abnormal (gait).      Gait: Gait abnormal.      Deep Tendon Reflexes: Reflexes abnormal. Babinski sign absent on the right side. Babinski sign absent on the left side.      Reflex Scores:       Bicep reflexes are 1+ on the right side and 1+ on the left side.       Brachioradialis reflexes are 1+ on the right side and 1+ on the left side.       Patellar reflexes are 0 on the right side and 0 on the left side.       Achilles reflexes are 0 on the right side and 0 on the left  side.  Psychiatric:         Attention and Perception: Attention normal.         Mood and Affect: Mood normal.         Speech: Speech normal.  Behavior: Behavior normal. Behavior is cooperative.         Cognition and Memory: Cognition normal.        Neurologic Exam     Mental Status   Speech: speech is normal   Level of consciousness: alert  Knowledge: good and consistent with education.   Normal comprehension.     Cranial Nerves   Cranial nerves II through XII intact.     CN III, IV, VI   Pupils are equal, round, and reactive to light.    Gait, Coordination, and Reflexes     Gait  Gait: wide-based    Tremor   Resting tremor: absent  Intention tremor: absent  Action tremor: absent    Reflexes   Right brachioradialis: 1+  Left brachioradialis: 1+  Right biceps: 1+  Left biceps: 1+  Right patellar: 0  Left patellar: 0  Right achilles: 0  Left achilles: 0  There is no tic, twitch, tonic or clonic activity noted.      No dyskinesia.     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  See today's EMG.   Positive exam correlates with EMG.     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   Breakthrough neuropathic pain  at gabapentin 1800 mg daily.      As discussed at length a review to get to higher dose gabapentin -- such as titrate up by 300 mg weekly to 2400 mg :: ( 2100 mg daily x7;  2400 mg daily  --  simlar steps later if going to 3600 mg daily as max therapeutic === for 600 mg tabs = such as 1200 mg TID).  Another option after optimized gabapentin ==  add second adjunctive med -- such as baclofen starting a titration.  He could not tolerate pregabalin or duloxetine.     I fully discussed these options with him and he desires to return to Dr Ardell Isaacs for  cont'd care.          I can see PRN.       All drugs and rx reviewed fully with patient and acknowledged specific to neurology as well.            Differential diagnostic decisions and thoughts reviewed and discussed.  Updating to patient identification, coordinate neurology visits, reasons for follow, review neurologic problems.          Extensive time::  Total time  25 minutes -  extra       *Extra to EMG Time*  See accompanying consultation.     Separate and Extra E/M Time to EMG Time*  ==         Patient is referred for consultation regarding as noted above:      [[Please note that this consultation is a separate note/E-M from the EMG procedure. ]]          More than 50% of this visit was spent in counseling and care coordination.         Treatment options fully reviewed with patient.  Time includes pre-  and post- face-face time in records review, and preparation including available pertinent images and reports.  Acknowledgements obtained where needed.   [ *NOTE: parts of this note were produced using artificial speech recognition software, which may have inadvertent errors in the produced wordings. ]          Cameron Proud MD  Consultative Neurology, Neurodiagnostics   [facility]    [address]   [address]  [place], [place] [address]  Phone:  [phone]  Fax:   [phone]        Signed By: Cameron Proud, MD     October 18, 2021

## 2021-10-18 NOTE — Addendum Note (Signed)
Addended by: Ky Barban on: 10/18/2021 11:03 AM     Modules accepted: Orders

## 2021-10-18 NOTE — Progress Notes (Signed)
EMG/Nerve Conduction Study Procedure Note  [address] [facility] [address]    [address]  [place], [place]  [address]   [phone]      Hx:    Exam:     66 y.o.  RH M W  male with long hx back problems, operations, LLE compartment syndrome leg; neuropathy/polyneuropathy; which continues.  Dad had IDDM and feet problems (no other fam hx).  No definite pes cavus or stork legs; on gabapentin - but breakthrough pains neuralgia still w bilat foot drop (hx exam likely L5's and neurop combo); on Eliguis; large man.  EMG ==   Ref::   PCP Dr Scherrie Gerlach  Tech:: Greta  Hgt::  [phone]"          Summary              needle EMG selected lower extremity muscles with conduction velocity testing right-sided lower right-sided upper testing.       Controlled environmental factors / EMG lab.  Temperature.   NCV : sensory segments:    Abnormal = absent right median and right ulnar SNAP.  There is marked slowing of the right radial SCV.  Right sural left sural both of these are absent/no response.  NCV transcarpal sensory segments:     Abnormal = = no response/absent right median SNAP with significantly slowed right ulnar transcarpal SCV.  NCV sensory test plantar::    Deferred.      NCV Motor MCV segments:     Markedly abnormal = severe = the right peroneal and tibial MCV including proximal peroneal to the tibialis anterior all of these with absent CMAP/no response.  Upper extremity at the right median is prolonged terminal latency at 6.00 ms (upper limit at 4.15 ms) with significant to severe attenuation of the CMAP and slowing of the proximal segment = and the right ulnar with slowing of the proximal segments and mildly reduced CMAP.  F-wave studies:         Markedly abnormal = markedly prolonged right median and right ulnar.  H-REFLEX Studies:    Markedly abnormal H-reflexes = = bilateral = absent/no response   NEEDLE EMG:   Tested muscles::    Tested right TA MG VL lower extremity and right upper extremity FDI EDC deltoid = the right TA no  activity with 3+ 4+ 4+ IA fib PSW no MUPs so there is no giant MUP.  The right gastrocnemius +1 IA with PE/RIP/cramp.  Normal right vastus lateralis.  Right FDI EDC deltoid = normal.      INTERPRETATION:   THESE FINDINGS ARE ELECTROPHYSIOLOGIC EVIDENCE OF SEVERE GENERALIZED AXONAL-DEMYELINATING SENSORIMOTOR POLYNEUROPATHY THAT IS QUITE SEVERE WIDESPREAD.  NO MYOPATHY MYOTONIA OR FASCICULATION.  Some of the features are compatible with generalized sensorimotor polyradiculoneuropathy apparently unknown origin.              CONCLUSION:      Compatible with a sensorimotor significant severe widespread axonal-demyelinating syndrome of polyneuropathy with compatible features of polyradiculoneuropathy.      Procedure Details:       Correlates with his history of long-term polyradiculoneuropathy, multiple back operations, previous radiculitis, bilateral foot drop superimposed, distal neuropathy length-dependent type, with several possible etiologies.  From patient's history and examinations over the past 15 years and multiple operations, certainly an underlying polyneuritis/polyneuropathy such as hereditary motor or sensory or idiopathic origin is compatible    Patient does not have a family history of such from review of his history with him.    He  has pain syndromes over the clinical correlation is recommended to continue.  He is on gabapentin with some breakthrough pains.                  Please Note::     Data and waveforms * filed under Procedure category [facility].    See Procedure Files for complete data pages.       *Extra to EMG Time*  See accompanying consultation.     Separate and Extra E/M Time to EMG Time*  ==         Patient is referred for consultation regarding as noted above:      [[Please note that this consultation is a separate note/E-M from the EMG procedure. ]]              Cameron Proud MD  Consultative Neurology, Neurodiagnostics   [facility]    [address]   [address]  [place], [place] [address]  Phone:  [phone]  Fax:   [phone]          + + +   Glossary:   MUP: motor unit potential;  SNAP: sensory nerve action potential; Fibs:  Fibrillations;  Fascic: fasciculations; IA: insertional activity;  IP: interference pattern;  SCV :  Sensory conduction velocity;  MCV: motor conduction velocity; NOTE:: muscles are abbreviated latin initials.     [place] raw datafile::   * filed at Procedure or [employer]. *

## 2021-11-02 NOTE — Telephone Encounter (Signed)
Patient is aware of his recent sleep study results. He will continue cpap therapy.

## 2021-11-07 ENCOUNTER — Encounter: Payer: MEDICARE | Primary: Family Medicine

## 2021-11-15 ENCOUNTER — Encounter: Payer: MEDICARE | Attending: Internal Medicine | Primary: Family Medicine

## 2021-11-15 ENCOUNTER — Encounter: Payer: MEDICARE | Primary: Family Medicine

## 2021-11-19 ENCOUNTER — Ambulatory Visit: Admit: 2021-11-19 | Discharge: 2021-11-19 | Payer: MEDICARE | Attending: Gastroenterology | Primary: Family Medicine

## 2021-11-19 DIAGNOSIS — R160 Hepatomegaly, not elsewhere classified: Secondary | ICD-10-CM

## 2021-11-19 MED ORDER — OMEPRAZOLE 20 MG PO CPDR
20 MG | ORAL_CAPSULE | Freq: Every day | ORAL | 3 refills | Status: AC
Start: 2021-11-19 — End: 2022-10-16

## 2021-11-19 NOTE — Progress Notes (Addendum)
Chad Rosario (DOB:  1955/03/29) is a 66 y.o. male,established patient here for evaluation of the following chief complaint(s):  Follow-up         ASSESSMENT/PLAN:  1. Hepatomegaly  -     Hepatitis Panel, Acute; Future  -     Miscellaneous Sendout; Future  2. Epigastric abdominal pain  -     omeprazole (PRILOSEC) 20 MG delayed release capsule; Take 1 capsule by mouth every morning (before breakfast), Disp-90 capsule, R-3Normal      Viral profile/ NASH score, I doubt the patient has full-blown cirrhosis and portal hypertension.  His liver synthetic function is still normal.   Prilosec started, if no help will schedule EGD       Subjective   SUBJECTIVE/OBJECTIVE  Patient is known to me from history of colon polyp colonoscopy performed September 2022 was noted to have multiple polyps is due for repeat in September 2024.  He presents today with a new finding of low platelet count of 80,000.  His hemoglobin is normal.  His liver enzymes are normal.  His albumin is normal.  Ultrasound shows diffuse hepatomegaly and splenomegaly, with calcification in the spleen possibly a granulomatous disease as per radiology.  His AST has been very mild elevated.  His ALT alkaline phosphatase is always been normal.  Most likely etiology could be fatty liver and Nash.  However there was no varices or reversal of blood flow noted on ultrasound.  Patient has also been complaining of postprandial epigastric left upper quadrant discomfort try to take Pepcid with no help denied dysphagia vomiting      Review of Systems   Gastrointestinal:  Positive for abdominal pain.            Objective     Physical Exam  HENT:      Head: Normocephalic.      Mouth/Throat:      Mouth: Mucous membranes are moist.   Eyes:      General: No scleral icterus.  Cardiovascular:      Rate and Rhythm: Normal rate and regular rhythm.   Pulmonary:      Effort: No respiratory distress.   Abdominal:      General: There is no distension.      Tenderness: There is no  abdominal tenderness. There is no rebound.   Lymphadenopathy:      Cervical: No cervical adenopathy.   Skin:     Coloration: Skin is not jaundiced.      Findings: No bruising.   Neurological:      General: No focal deficit present.      Mental Status: He is alert.            Return in about 3 weeks (around 12/10/2021).            An electronic signature was used to authenticate this note.    --Drucie Opitz, MD

## 2021-11-21 ENCOUNTER — Encounter

## 2021-11-21 LAB — HEPATITIS PANEL, ACUTE
Hep A IgM: NONREACTIVE
Hep B Core Ab, IgM: NONREACTIVE
Hepatitis B Surface Ag: NONREACTIVE
Hepatitis C Ab: NONREACTIVE

## 2021-11-23 ENCOUNTER — Ambulatory Visit: Admit: 2021-11-23 | Discharge: 2021-11-23 | Payer: MEDICARE | Attending: Family Medicine | Primary: Family Medicine

## 2021-11-23 DIAGNOSIS — E782 Mixed hyperlipidemia: Secondary | ICD-10-CM

## 2021-11-23 MED ORDER — ALIGN 4 MG PO CAPS
4 MG | ORAL_CAPSULE | Freq: Every day | ORAL | 2 refills | Status: AC
Start: 2021-11-23 — End: 2022-03-26

## 2021-11-24 NOTE — Progress Notes (Signed)
Chad Rosario  Jan 05, 1956 is a 66 y.o. male ,Established patient, here for evaluation of the following chief complaint(s):   Chief Complaint   Patient presents with    3 Month Follow-Up     Pt is not fasting-           1. Mixed hyperlipidemia  -     Lipid Panel; Future  2. Dyspepsia  -     Probiotic Product (ALIGN) 4 MG CAPS; Take 4 mg by mouth daily, Disp-30 capsule, R-2Normal        Return in about 4 months (around 03/25/2022).        Subjective   SUBJECTIVE/OBJECTIVE:  He has been to see Dr. Lajean Saver and found to have HEPATOMEGALY.    ORTHOPEDICS--will be working on knee---MENISCAL TEAR.    NERVE CONDUCTION STUDIES--neuropathy---gabapentin increased.    Hyperlipidemia  This is a chronic problem. The current episode started more than 1 year ago. The problem is resistant. Recent lipid tests were reviewed and are variable. Pertinent negatives include no chest pain, myalgias or shortness of breath.   Hypertension  This is a chronic problem. The current episode started more than 1 year ago. The problem has been gradually improving since onset. The problem is controlled. Pertinent negatives include no anxiety, blurred vision, chest pain, headaches, palpitations, peripheral edema, PND, shortness of breath or sweats.     Review of Systems   Constitutional:  Negative for chills and fever.   HENT:  Negative for ear pain, hearing loss and sore throat.    Eyes:  Negative for blurred vision, photophobia and pain.   Respiratory:  Negative for cough and shortness of breath.    Cardiovascular:  Negative for chest pain, palpitations, leg swelling and PND.   Gastrointestinal:  Negative for abdominal distention, abdominal pain, blood in stool and nausea.   Genitourinary:  Negative for dysuria and urgency.   Musculoskeletal:  Negative for joint swelling and myalgias.   Skin:  Negative for color change, pallor and rash.   Neurological:  Negative for speech difficulty, weakness, light-headedness and headaches.   Hematological:  Negative  for adenopathy.   Psychiatric/Behavioral:  Negative for agitation, confusion, hallucinations, self-injury and suicidal ideas.         Objective   Physical Exam  Constitutional:       Appearance: Normal appearance.   HENT:      Head: Normocephalic and atraumatic.      Nose: Nose normal.   Eyes:      Extraocular Movements: Extraocular movements intact.      Conjunctiva/sclera: Conjunctivae normal.      Pupils: Pupils are equal, round, and reactive to light.   Cardiovascular:      Rate and Rhythm: Normal rate and regular rhythm.      Pulses: Normal pulses.      Heart sounds: Normal heart sounds.   Pulmonary:      Effort: Pulmonary effort is normal.      Breath sounds: Normal breath sounds.   Abdominal:      General: Abdomen is flat. Bowel sounds are normal.      Palpations: Abdomen is soft.   Skin:     General: Skin is warm and dry.   Neurological:      General: No focal deficit present.      Mental Status: He is alert and oriented to person, place, and time.   Psychiatric:         Mood and Affect: Mood normal.  An electronic signature was used to authenticate this note.    --Alvino Blood, MD

## 2021-11-27 ENCOUNTER — Encounter: Payer: MEDICARE | Primary: Family Medicine

## 2021-11-27 ENCOUNTER — Ambulatory Visit: Admit: 2021-11-27 | Discharge: 2021-11-27 | Payer: MEDICARE | Attending: Internal Medicine | Primary: Family Medicine

## 2021-11-27 DIAGNOSIS — I82401 Acute embolism and thrombosis of unspecified deep veins of right lower extremity: Secondary | ICD-10-CM

## 2021-11-27 DIAGNOSIS — D696 Thrombocytopenia, unspecified: Secondary | ICD-10-CM

## 2021-11-27 LAB — CBC WITH AUTO DIFFERENTIAL
Absolute Immature Granulocyte: 0 10*3/uL (ref 0.0–0.5)
Basophils %: 1 % (ref 0.0–2.0)
Basophils Absolute: 0.1 10*3/uL (ref 0.0–0.2)
Eosinophils %: 9 % — ABNORMAL HIGH (ref 0.5–7.8)
Eosinophils Absolute: 0.6 10*3/uL (ref 0.0–0.8)
Hematocrit: 49.3 % (ref 41.1–50.3)
Hemoglobin: 17.7 g/dL — ABNORMAL HIGH (ref 13.6–17.2)
Immature Granulocytes: 0 % (ref 0.0–5.0)
Lymphocytes %: 22 % (ref 13–44)
Lymphocytes Absolute: 1.6 10*3/uL (ref 0.5–4.6)
MCH: 32.3 PG (ref 26.1–32.9)
MCHC: 35.9 g/dL — ABNORMAL HIGH (ref 31.4–35.0)
MCV: 90 FL (ref 82.0–102.0)
MPV: 12.1 FL (ref 9.4–12.3)
Monocytes %: 10 % (ref 4.0–12.0)
Monocytes Absolute: 0.7 10*3/uL (ref 0.1–1.3)
Neutrophils %: 59 % (ref 43–78)
Neutrophils Absolute: 4.2 10*3/uL (ref 1.7–8.2)
Platelets: 72 10*3/uL — ABNORMAL LOW (ref 150–450)
RBC: 5.48 M/uL (ref 4.23–5.6)
RDW: 14.9 % — ABNORMAL HIGH (ref 11.9–14.6)
WBC: 7.2 10*3/uL (ref 4.3–11.1)
nRBC: 0 10*3/uL (ref 0.0–0.2)

## 2021-11-27 LAB — COMPREHENSIVE METABOLIC PANEL
ALT: 44 U/L (ref 12–65)
AST: 31 U/L (ref 15–37)
Albumin/Globulin Ratio: 1 (ref 0.4–1.6)
Albumin: 3.6 g/dL (ref 3.2–4.6)
Alk Phosphatase: 55 U/L (ref 50–136)
Anion Gap: 6 mmol/L (ref 2–11)
BUN: 20 MG/DL (ref 8–23)
CO2: 25 mmol/L (ref 21–32)
Calcium: 9.1 MG/DL (ref 8.3–10.4)
Chloride: 108 mmol/L (ref 101–110)
Creatinine: 1.2 MG/DL (ref 0.8–1.5)
Est, Glom Filt Rate: >60 mL/min/{1.73_m2} (ref >60)
Globulin: 3.6 g/dL (ref 2.8–4.5)
Glucose: 125 mg/dL — ABNORMAL HIGH (ref 65–100)
Potassium: 4.6 mmol/L (ref 3.5–5.1)
Sodium: 139 mmol/L (ref 133–143)
Total Bilirubin: 0.8 MG/DL (ref 0.2–1.1)
Total Protein: 7.2 g/dL (ref 6.3–8.2)

## 2021-11-27 NOTE — Patient Instructions (Addendum)
Patient Instructions from Today's Visit    Reason for Visit:  Follow up    Diagnosis Information:  https://www.cancer.net/about-us/asco-answers-patient-education-materials/asco-answers-fact-sheets     Plan:  -Your platelet count is holding steady.  -Your legs do not show any concern to Dr. Karie Mainland from your last visit regarding swelling.  -The discoloration can be due to the clots and the blood vessels reacting to working around them. Unfortunately there isn't much to do about this.  -Follow up with your PCP as scheduled.  -If your platelets drop below 50,000 then you will need to hold your Eliquis as this increases your risk of bleeding.  -If you lose weight, then the liver could improve which would also reduce the inflammation within your body.  -If you need to see [place] or have concerns prior to your next visit, then give our office a call or send a MyChart message.    Follow Up:  Dr. Karie Mainland again in 6 months.    Recent Lab Results:  Hospital Outpatient Visit on 11/27/2021   Component Date Value Ref Range Status    WBC 11/27/2021 7.2  4.3 - 11.1 K/uL Final    RBC 11/27/2021 5.48  4.23 - 5.6 M/uL Final    Hemoglobin 11/27/2021 17.7 (H)  13.6 - 17.2 g/dL Final    Hematocrit 62/13/0865 49.3  41.1 - 50.3 % Final    MCV 11/27/2021 90.0  82.0 - 102.0 FL Final    MCH 11/27/2021 32.3  26.1 - 32.9 PG Final    MCHC 11/27/2021 35.9 (H)  31.4 - 35.0 g/dL Final    RDW 78/46/9629 14.9 (H)  11.9 - 14.6 % Final    Platelets 11/27/2021 72 (L)  150 - 450 K/uL Final    MPV 11/27/2021 12.1  9.4 - 12.3 FL Final    nRBC 11/27/2021 0.00  0.0 - 0.2 K/uL Final    **Note: Absolute NRBC parameter is now reported with Hemogram**    Differential Type 11/27/2021 AUTOMATED    Final    Neutrophils % 11/27/2021 59  43 - 78 % Final    Lymphocytes % 11/27/2021 22  13 - 44 % Final    Monocytes % 11/27/2021 10  4.0 - 12.0 % Final    Eosinophils % 11/27/2021 9 (H)  0.5 - 7.8 % Final    Basophils % 11/27/2021 1  0.0 - 2.0 % Final    Immature Granulocytes  11/27/2021 0  0.0 - 5.0 % Final    Neutrophils Absolute 11/27/2021 4.2  1.7 - 8.2 K/UL Final    Lymphocytes Absolute 11/27/2021 1.6  0.5 - 4.6 K/UL Final    Monocytes Absolute 11/27/2021 0.7  0.1 - 1.3 K/UL Final    Eosinophils Absolute 11/27/2021 0.6  0.0 - 0.8 K/UL Final    Basophils Absolute 11/27/2021 0.1  0.0 - 0.2 K/UL Final    Absolute Immature Granulocyte 11/27/2021 0.0  0.0 - 0.5 K/UL Final         Treatment Summary has been discussed and given to patient: N/A        -------------------------------------------------------------------------------------------------------------------  Please call our office at [phone] if you have any  of the following symptoms:   Fever of 100.5 or greater  Chills  Shortness of breath  Swelling or pain in one leg    After office hours an answering service is available and will contact a provider for emergencies or if you are experiencing any of the above symptoms.    Patient does express an interest in  My Chart.  My Chart log in information explained on the after visit summary printout at the check-out desk.    Thomasene Lot, MA

## 2021-11-27 NOTE — Progress Notes (Signed)
[facility]: Office Visit Established Patient    Reason for follow up:    H/o DVT  thrombocytopenia      Oncologic overview: per [place] visit dated 07/07/19:    Secondary erythrocytosis   Hx of hemoglobin as high as 19   JAK2 negative; splenomegaly - mild   Bone marrow consistent with secondary erythrocytosis 7/16; JAK2, CALr, MPL on marrow negative    Splenomegaly with thrombocytopenia   Negative bone marrow 8/16   Hepatitis panel pending 12/16    DVT RLE 7/17   There was not enough blood to do the complete antiphospholipid testing. The lupus anticoagulant was positive which may be artifactual related to the presence of xarelto. Both factor V Leiden and the prothrombin gene mutation were absent        DIAGNOSIS     "BONE MARROW CORE BIOPSY, CLOT SECTION, ASPIRATE AND PERIPHERAL BLOOD   SMEAR:   - MILDLY HYPERCELLULAR MARROW WITH ERYTHROID HYPERPLASIA; SEE NOTE   - NO SIGNIFICANT MARROW FIBROSIS IDENTIFIED ON RETICULIN STAIN     NOTE: LABORATORY STUDIES ARE NOTABLE FOR AN ELEVATED ERYTHROPOIETIN   (28.2 MIU/ML, REF 2.6-18.5), AND NEGATIVE FOR MUTATIONS OF JAK2 V617F   ([facility]), JAK2 EXONS 12-14, CALRETICULIN, AND MPL. OVERALL, THE   FEATURES ARE MOST IN KEEPING WITH SECONDARY ERYTHROCYTOSIS.     THE CASE WAS DISCUSSED WITH DR. HINES AT 9:56 AM, 11/29/2014. 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     In June 2022, patient relocated to [place] from [place] to be closer to her children.  He has establish care with PCP and cardiology already.  He screening colonoscopy was  completed by Dr. Marcelene Butte on 11/29/2020 with history of colon polyps.     Patient's presents today to discuss management of chronic anticoagulation.  He has been on anticoagulation for right lower extremity DVT diagnosed in July 2017.  He continues to take Xarelto 10 mg daily.  Based on history of coronary artery disease, he also takes aspirin 81 mg oral daily.     On evaluation today, he denies lower extremity pain or persistent swelling.  Right leg swells after a long drive.  He denies chest pain, unusual shortness of breath, nausea, vomiting, gum bleeds/nosebleeds/bloody stool/hematuria, abdominal pain, diarrhea or dysuria.  Denies fevers, drenching night sweats, early satiety, unintentional weight loss.  No falls, head trauma.     C/o mild macular rash on the dorsal aspect of left foot.  Also has chronic neuropathy involving bilateral feet-unchanged"      On 06/07/2021 he underwent PTV/PopV recanalization with PA (Dr. Judithann Sheen).     Interval history:  Patient returns for follow-up evaluation and review of labs and imaging.  He reports improvement in leg swelling and pain.  He denies chest pain, dizziness, nausea, vomiting, fever, black or bloody stools, spontaneous bleeding.  Mild bruising more prominent in bilateral forearms has remained unchanged.  He has been tolerating Eliquis without any major problems.Ultrasound abdomen on 07/24/2021 showed mild hepatomegaly and hepatic steatosis.  16.3 cm splenomegaly was also noted.  Patient is unable to get MRI due to incompatible metallic implants.  Denies any urinary complaints.  He is established with Dr. Marcelene Butte for management of fatty liver/NASH      Review of Systems:  14 point ROS was negative except as per HPI      ECOG PERFORMANCE STATUS - 0-Fully active, able to carry on all pre-disease performance without restriction.    Pain - /10. None/Minimal pain - not affecting QOL     Fatigue - No flowsheet data found.  Distress - No flowsheet data found.         Reviewed and  updated this visit by provider:  Tobacco  Allergies  Meds  Problems  Med Hx  Surg Hx  Fam Hx          Current Outpatient Medications   Medication Sig Dispense Refill    omeprazole (PRILOSEC) 20 MG delayed release capsule Take 1 capsule by mouth every morning (before breakfast) 90 capsule 3    Multiple Vitamins-Minerals (THERAPEUTIC MULTIVITAMIN-MINERALS) tablet Take 1 tablet by mouth daily      vitamin D3 (CHOLECALCIFEROL) 10 MCG (400 UNIT) TABS tablet Take 2 tablets by mouth daily      CPAP Machine MISC by Does not apply route      apixaban (ELIQUIS) 5 MG TABS tablet Take 1 tablet by mouth 2 times daily 180 tablet 1    zolpidem (AMBIEN) 10 MG tablet Take 1 tablet by mouth nightly as needed for Sleep for up to 180 days. Max Daily Amount: 10 mg 90 tablet 0    gabapentin (NEURONTIN) 300 MG capsule Take 1 capsule by mouth in the morning, at noon, and at bedtime for 360 days. 270 capsule 3    lisinopril (PRINIVIL;ZESTRIL) 20 MG tablet Take 1 tablet by mouth daily 90 tablet 3    colchicine (MITIGARE) 0.6 MG capsule Take 1 capsule by mouth as needed for Pain 30 capsule 3    Acetaminophen 500 MG CAPS Take 2 capsules by mouth as needed      allopurinol (ZYLOPRIM) 300 MG tablet TAKE 1 TABLET BY MOUTH  DAILY      Alpha-Lipoic Acid 200 MG CAPS Take 300 mg by mouth      amitriptyline (ELAVIL) 10 MG tablet nightly as needed      aspirin 81 MG EC tablet Take 1 tablet by mouth daily      azelastine (ASTELIN) 0.1 % nasal spray 1 spray by Nasal route as needed      fexofenadine (ALLEGRA) 180 MG tablet Take 1 tablet by mouth daily      Probiotic Product (ALIGN) 4 MG CAPS Take 4 mg by mouth daily (Patient not taking: Reported on 11/27/2021) 30 capsule 2    predniSONE (DELTASONE) 10 MG tablet 2 tablets twice daily for 1 week, 1 tablet twice daily for 1 week, 1 tablet each morning for 1 week, 1/2 tablet each morning for 1 week then discontinue treatment (Patient not taking: Reported on 11/27/2021) 80 tablet 0    celecoxib (CELEBREX)  200 MG capsule  (Patient not taking: Reported on 11/27/2021)       No current facility-administered medications for this visit.        OBJECTIVE:  BP (!) 147/92 (Site: Right Upper Arm, Position: Sitting, Cuff Size: Large Adult)   Pulse 61   Temp 98.2 F (36.8 C) (Oral)   Resp 18   Ht '6\' 1"'  (1.854 m)   Wt 288 lb 11.2 oz (131 kg)   SpO2 95%   BMI 38.09 kg/m   Wt Readings from  Last 3 Encounters:   11/27/21 288 lb 11.2 oz (131 kg)   11/23/21 288 lb 3.2 oz (130.7 kg)   11/19/21 286 lb (129.7 kg)       Physical Exam:  Patient alert and oriented x 3, in no acute distress  Integumentary: No Pallor, no icterus  HEENT: moist mucous membranes, normal oropharynx  Lymph nodes: no cervical or axillary LAD  Cardiovascular:RRR, S1, S2 present, no m/r/g   Lungs: Clear to auscultation, no rales or wheezing, no accessory muscle use  Abdomen: Soft, and non-tender on palpation, no organomegaly, bowel sounds audible  Extremities: Mild bilateral leg edema is noted.  No calf tenderness, Homans negative.  Neurological: patient can follow commands and move all extremities    Labs:  Lab Results   Component Value Date    WBC 7.2 11/27/2021    HGB 17.7 (H) 11/27/2021    HCT 49.3 11/27/2021    MCV 90.0 11/27/2021    PLT 72 (L) 11/27/2021     Lab Results   Component Value Date    NEUTROABS 4.2 11/27/2021    LYMPHOPCT 22 11/27/2021    LYMPHSABS 1.6 11/27/2021    MONOPCT 10 11/27/2021    MONOSABS 0.7 11/27/2021    EOSABS 0.6 11/27/2021    BASOPCT 1 11/27/2021     Lab Results   Component Value Date    NA 138 08/15/2021    K 4.6 08/15/2021    CL 105 08/15/2021    CO2 29 08/15/2021    BUN 17 08/15/2021    CREATININE 1.20 08/15/2021    GLUCOSE 127 (H) 11/21/2021    CALCIUM 9.1 08/15/2021    PROT 7.6 08/15/2021    LABALBU 3.9 08/15/2021    BILITOT 0.8 11/21/2021    ALKPHOS 61 08/15/2021    AST 32 08/15/2021    ALT 51 11/21/2021    LABGLOM >60 08/15/2021    GFRAA >60 12/18/2020    GLOB 3.7 08/15/2021     Date Obtained:   11/29/2020   DIAGNOSIS         A:  "ASCENDING COLON POLYPS":  FRAGMENTS OF MIXED TUBULOVILLOUS   ADENOMA.        B:  "TRANSVERSE COLON POLYP":  FRAGMENTS OF MIXED TUBULOVILLOUS   ADENOMA.           sms/12/01/2020     Sign Out Date: 12/01/2020  Chad Rosario., M.D.             Imaging:  XR THORACIC SPINE (3 VIEWS)    Result Date: 01/31/2021  1. Extensive postsurgical change without definite acute abnormality. 2. Fractured sternal wires seen within the upper sternum.    XR LUMBAR SPINE (MIN 4 VIEWS)    Result Date: 01/31/2021  1. Extensive postsurgical change without definite acute abnormality. 2. Fractured sternal wires seen within the upper sternum.    12/25/20: IMPRESSION:  No evidence of deep venous thrombosis in either lower extremity      07/24/21:IMPRESSION: US abdomen  -Mild hepatomegaly with diffusely increased hepatic echogenicity, likely  reflecting hepatic steatosis.  -Splenomegaly. Calcifications within the spleen, possibly reflecting sequelae of  prior granulomatous disease.    Problems:  1. History of DVT (deep vein thrombosis), right lower extremity   -Long-term anticoagulation with Xarelto  -Chronic mild thrombocytopenia, without evidence of excessive bleeding-this has been worked up in the past.  He has had a bone marrow biopsy in 2017.    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  -  Seasonal allergies  -Hypertension  -Sleep disturbance    PLAN:  I reviewed the results of most recent labs and imaging with the patient.  Platelet count is 61,000 and he has been switched to Eliquis 5 mg p.o. twice daily.  He is scheduled to undergo another procedure for management of superficial venous insufficiency of lower extremity in July.  Today again, I reviewed various possible causes of thrombocytopenia including but not limited to nutritional deficiencies, primary bone marrow conditions, ITP and chronic liver disease etc.  He will be referred to gastroenterology for evaluation of chronic liver disease as a  possible contributor to thrombocytopenia.  His platelet counts should be closely monitored while on anticoagulation.  I have therefore ordered CBC to be checked every 6 weeks.  Anticoagulation should be held for platelet count less than 50,000 or evidence of active bleeding.  I shall plan to see Chad Rosario back in about 3 months.  I have discussed the role of repeat bone marrow biopsy and further work-up should his platelet count decline further/other cytopenias develop.  -11/27/21: I reviewed the results of most recent labs with the patient.  Erythrocytosis is again noted.  Platelet count is 72,000.  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  He will continue taking aspirin 81 mg EC daily and Eliquis 5 mg p.o. twice daily.  Follow-up with [facility] as scheduled.  Encouraged weight loss.  I shall plan to see him back in 6 months or sooner if needed.        Chad Ill, MD  [facility] [place] [address]  Office : [phone]  Fax : [phone]    Elements of this note have been dictated using speech recognition software. As a result, errors of speech recognition may have occurred.

## 2021-11-29 LAB — NASH FIBROSURE PLUS
ALT: 51 IU/L (ref 0–55)
AST (SGOT)   (NASH), 13987: 40 IU/L (ref 0–40)
Alpha 2-Macroglobulins, Qn: 261 mg/dL (ref 110–276)
Apolipoprotein A-1: 144 mg/dL (ref 101–178)
Cholesterol: 165 mg/dL (ref 100–199)
Fibrosis Score: 0.59 — ABNORMAL HIGH (ref 0.00–0.21)
GGT: 30 IU/L (ref 0–65)
Glucose: 127 mg/dL — ABNORMAL HIGH (ref 70–99)
Haptoglobin: 87 mg/dL (ref 32–363)
Height (NASH): 73 in
NASH - Steatosis Score: 0.56 — ABNORMAL HIGH (ref 0.00–0.40)
NASH Score: 0.81 — ABNORMAL HIGH (ref 0.00–0.25)
Total Bilirubin: 0.8 mg/dL (ref 0.0–1.2)
Triglycerides: 153 mg/dL — ABNORMAL HIGH (ref 0–149)
Weight (NASH): 286 [lb_av]

## 2021-12-10 NOTE — Progress Notes (Signed)
Formatting of this note is different from the original.  GYF:[id number] [name], MD    Patient ID: Chad Rosario is a 66 y.o. male.  The patient was identified using two red rule identifiers - Clerance Lav    Chief Complaint:  Chief Complaint   Patient presents with    Right Knee - Follow-up     HISTORY OF PRESENT ILLNESS     Chad Rosario is a 66 y.o.  who presents today for who presents today for preoperative examination. He is seen by Dr. Carlynn Spry for a medial meniscus tear of the right knee. He is scheduled for a right knee arthroscopy with medial menisectomy on 01/03/22. Overall, he is doing well. He still continues to have pain on a regular basis with intermittent good days and bad days. His pain increases with activity, especially exacerbated by going up and down steps, squatting, twisting, pivoting and getting in and out of a chair. She has tried conservative measures, Anti-Inflammatories, activity modification, and cortisone injections with minimal relief. He denies any new injuries or worsening injuries, denies any new complaints or concerns. He denies any numbness, tingling, fever, chills, chest pain, shortness of breath or calf pain.     Pain Assessment  Pain Assessment: 0-10  Numerical Rating Scale (0-10) Score: 3  Pain Location: Knee  Pain Orientation: Right  Pain Descriptors: Aching;Discomfort;Sore;Tightness;Throbbing;Stabbing  Pain Frequency: Intermittent  Pain Onset: Gradual  Clinical Progression: Not changed  Aggravating Factors: Standing;Walking;Stairs;Sit to Stand  Result of Injury: No  Work-Related Injury: No  Patient's Stated Pain Goal: No pain    Medications:  Current Outpatient Medications on File Prior to Visit   Medication Sig Dispense Refill    allopurinoL (use for ZYLOPRIM) 300 mg tablet Take 300 mg by mouth      alpha lipoic acid 200 mg capsule Take 300 mg by mouth      amitriptyline (use for ELAVIL) 10 mg tablet Take 1 tablet by mouth nightly      apixaban (use for ELIQUIS) 5 mg tablet  Take 1 tablet by mouth in the morning and 1 tablet in the evening.      Bifidobacterium infantis (Align) 4 mg capsule Take 4 mg by mouth in the morning.      celecoxib (use for CeleBREX) 200 mg capsule       colchicine 0.6 mg capsule capsule Take 0.6 mg by mouth      fexofenadine (use for ALLEGRA) 180 mg tablet Take 1 tablet by mouth in the morning.      gabapentin (use for NEURONTIN) 300 mg capsule Take 300 mg by mouth      lisinopriL (use for ZESTRIL) 20 mg tablet Take 1 tablet by mouth in the morning.      omeprazole (use for PRILOSEC) 20 mg capsule Take 20 mg by mouth      scopolamine (use for TRANSDERM SCOP) patch 3 day Place 1 patch on the skin      zolpidem (use for AMBIEN) 10 mg tablet Take 10 mg by mouth       No current facility-administered medications on file prior to visit.       Allergies:  Allergies   Allergen Reactions    Hydrocodone-Acetaminophen Anxiety and Itching    Losartan Shortness of breath    Amlodipine Other (see comments)     Fatigue    Other reaction(s): Other (See Comments)   Fatigue    Fatigue     Fatigue     Rosuvastatin  GI intolerance and Other (see comments)     Other reaction(s): Constipation   Fatigue, constipation   Other reaction(s): Fatigue   Fatigue, constipation   Other reaction(s): Fatigue    Other reaction(s): Constipation   Fatigue, constipation   Other reaction(s): Fatigue    Fatigue, constipation   Other reaction(s): Fatigue    Atorvastatin Diarrhea    Ezetimibe GI intolerance and Other (see comments)     Other reaction(s): Constipation, Other (See Comments)   Constipation and decreased urine output   Constipation and decreased urine output   Other reaction(s): Constipation, Urine output low   Constipation and decreased urine output   Constipation and decreased urine output   Other reaction(s): Constipation, Urine output low    Other reaction(s): Constipation, Other (See Comments)   Constipation and decreased urine output   Constipation and decreased urine output   Other  reaction(s): Constipation, Urine output low    Constipation and decreased urine output    Constipation and decreased urine output   Other reaction(s): Constipation, Urine output low     PAST MEDICAL AND SOCIAL HISTORY      Past Medical History:   Diagnosis Date    Chronic dyspnea     Coronary artery disease 2015    CTS (carpal tunnel syndrome) 2019    Deep vein thrombosis (CMS/HCC) 2017    Fractures 2007    Hyperlipidemia     Hypertension 2010    Neuropathy     Scoliosis 2015    Sleep apnea     Thrombocytopenia (CMS/HCC)      Past Surgical History:   Procedure Laterality Date    BACK SURGERY  02/2019    CARDIAC SURGERY      CARPAL TUNNEL RELEASE  2019    CATARACT EXTRACTION Left     CHOLECYSTECTOMY      CORONARY ARTERY BYPASS GRAFT      FEMUR FRACTURE SURGERY  2007    FRACTURE SURGERY Left     LEFT TIB-FIB    JOINT REPLACEMENT  2018    Left Hip    KNEE ARTHROSCOPY  1999    LAMINECTOMY  2003 & 2015    SHOULDER ARTHROSCOPY  2000    SPINAL FUSION  2020     Family History   Problem Relation Age of Onset    Diabetes Father      Social History     Occupational History    Not on file   Tobacco Use    Smoking status: Never    Smokeless tobacco: Never   Substance and Sexual Activity    Alcohol use: Yes     Types: 2 Glasses of wine, 4 Cans of beer, 6 Standard drinks or equivalent per week    Drug use: Never    Sexual activity: Not Currently     Partners: Female     Birth control/protection: None     REVIEW OF SYSTEMS     Review of Systems  Constitutional: he is oriented to person, place, and time and well-developed, well-nourished, and in no distress.   Head: Normocephalic and atraumatic.   Eyes: EOM are normal.   Neck: Neck supple.   Cardiovascular: Intact distal pulses.    Pulmonary/Chest: Effort normal. No respiratory distress.   Neurological: he is alert and oriented to person, place, and time.   Skin: Skin is dry. No rash noted. No erythema.   Psychiatric: Affect normal.     PHYSICAL EXAM     Vital Signs:  Visit  Vitals  Ht 6\' 1"  (1.854 m)   Wt 127 kg (280 lb)   BMI 36.94 kg/m       Orthopedic Exam:    There are no changes in the orthopedic exam.    IMAGING RESULTS     No new images were obtained at today's office visit.    IMPRESSION       ICD-10-CM ICD-9-CM    1. Complex tear of medial meniscus of right knee as current injury, subsequent encounter  S83.231D V58.89        PLAN     [place] has failed conservative measures of Anti-Inflammatories, activity modification, and cortisone injections and they would like to proceed with surgical intervention.  He has elected to proceed with a right Knee Arthroscopy.   Plan for a partial medial meniscectomy versus repair and procedures as indicated. Risks of the surgery were reviewed, including but not limited to:  risks of infection, pain, bleeding, blood clots, damage to nerves and blood vessels, stiffness, numbness, swelling, and wound healing complications.  We discussed the use of antibiotics to prevent infection. We also discussed the medical necessity of a physician assistant intraoperatively. All of his questions are answered and we will schedule this at his convenience.     Return for Surgery.    The office note today was created using speech recognition software; as a result, errors in speech recognition may have occurred.     SCRIBE ATTESTATION     By signing my name below, I, Don Perking, attest that this documentation has been prepared under the direction and in the presence of Alfonzo Feller, MD.  Electronically signed: Don Perking, 12/10/2021, 11:27 AM    I, Alfonzo Feller, MD, personally performed the services described in this documentation. All medical record entries made by the scribe were at my direction and in my presence. I have reviewed the chart and agree that the record reflects my personal performance and is accurate and complete.    Electronically signed: Alfonzo Feller, MD  12/10/2021  12:27 PM       Electronically signed by Sharmon Leyden, MD at  12/10/2021 12:27 PM EDT

## 2021-12-24 ENCOUNTER — Encounter: Payer: MEDICARE | Attending: Gastroenterology | Primary: Family Medicine

## 2021-12-31 NOTE — Progress Notes (Signed)
Formatting of this note is different from the original.  SEG:[id number] Elenore Rota, MD  Referring provider:Chappell, Herbert Moors*    Patient ID: Chad Rosario is a 66 y.o. male.  The patient was identified using two red rule identifiers - Francia Greaves, RT    Chief Complaint:  Chief Complaint   Patient presents with    Right Shoulder - Pain     HISTORY OF PRESENT ILLNESS     Chad Rosario is a 66 y.o.  who presents today for right shoulder pain. He states his pain has been present for a few years with no known injury or trauma. He has a history of a right shoulder arthroscopy years ago. He describes his pain as intermittent shooting and sharp with a severity of 7/10. His right shoulder pain wakes him from sleep and is worsened with reaching, lifting, and overhead work. He takes Ibuprofen for his shoulder pain. He denies any neck discomfort. He denies any numbness or tingling. He is right handed.     Pain Assessment  Pain Assessment: 0-10  Numerical Rating Scale (0-10) Score: 7  Pain Location: Shoulder  Pain Orientation: Right  Pain Descriptors: Lambert Mody;Shooting  Pain Frequency: Intermittent  Pain Onset: Awakened from sleep  Clinical Progression: Not changed  Aggravating Factors: Reaching;Lifting;Overhead work  Result of Injury: No  Work-Related Injury: No  Patient's Stated Pain Goal: No pain  Pain Interventions: Home medication;Other (Comment) (ibuprofen)    Medications:  Current Outpatient Medications on File Prior to Visit   Medication Sig Dispense Refill    allopurinoL (use for ZYLOPRIM) 300 mg tablet Take 300 mg by mouth      alpha lipoic acid 200 mg capsule Take 300 mg by mouth      amitriptyline (use for ELAVIL) 10 mg tablet Take 1 tablet by mouth nightly      apixaban (use for ELIQUIS) 5 mg tablet Take 1 tablet by mouth in the morning and 1 tablet in the evening.      Bifidobacterium infantis (Align) 4 mg capsule Take 4 mg by mouth in the morning.      celecoxib (use for CeleBREX) 200 mg capsule        colchicine 0.6 mg capsule capsule Take 0.6 mg by mouth      fexofenadine (use for ALLEGRA) 180 mg tablet Take 1 tablet by mouth in the morning.      gabapentin (use for NEURONTIN) 300 mg capsule Take 300 mg by mouth      lisinopriL (use for ZESTRIL) 20 mg tablet Take 1 tablet by mouth in the morning.      omeprazole (use for PRILOSEC) 20 mg capsule Take 20 mg by mouth      scopolamine (use for TRANSDERM SCOP) patch 3 day Place 1 patch on the skin      zolpidem (use for AMBIEN) 10 mg tablet Take 10 mg by mouth       No current facility-administered medications on file prior to visit.       Allergies:  Allergies   Allergen Reactions    Hydrocodone-Acetaminophen Anxiety and Itching    Losartan Shortness of breath    Amlodipine Other (see comments)     Fatigue    Other reaction(s): Other (See Comments)   Fatigue    Fatigue     Fatigue     Rosuvastatin GI intolerance and Other (see comments)     Other reaction(s): Constipation   Fatigue, constipation   Other reaction(s): Fatigue   Fatigue,  constipation   Other reaction(s): Fatigue    Other reaction(s): Constipation   Fatigue, constipation   Other reaction(s): Fatigue    Fatigue, constipation   Other reaction(s): Fatigue    Atorvastatin Diarrhea    Ezetimibe GI intolerance and Other (see comments)     Other reaction(s): Constipation, Other (See Comments)   Constipation and decreased urine output   Constipation and decreased urine output   Other reaction(s): Constipation, Urine output low   Constipation and decreased urine output   Constipation and decreased urine output   Other reaction(s): Constipation, Urine output low    Other reaction(s): Constipation, Other (See Comments)   Constipation and decreased urine output   Constipation and decreased urine output   Other reaction(s): Constipation, Urine output low    Constipation and decreased urine output    Constipation and decreased urine output   Other reaction(s): Constipation, Urine output low     PAST MEDICAL AND  SOCIAL HISTORY      Past Medical History:   Diagnosis Date    Chronic dyspnea     Coronary artery disease 2015    CTS (carpal tunnel syndrome) 2019    Deep vein thrombosis (CMS/HCC) 2017    Fractures 2007    Hyperlipidemia     Hypertension 2010    Neuropathy     Scoliosis 2015    Sleep apnea     Thrombocytopenia (CMS/HCC)      Past Surgical History:   Procedure Laterality Date    BACK SURGERY  02/2019    CARDIAC SURGERY      CARPAL TUNNEL RELEASE  2019    CATARACT EXTRACTION Left     CHOLECYSTECTOMY      CORONARY ARTERY BYPASS GRAFT      FEMUR FRACTURE SURGERY  2007    FRACTURE SURGERY Left     LEFT TIB-FIB    JOINT REPLACEMENT  2018    Left Hip    KNEE ARTHROSCOPY  1999    LAMINECTOMY  2003 & 2015    SHOULDER ARTHROSCOPY  2000    SPINAL FUSION  2020     Family History   Problem Relation Age of Onset    Diabetes Father      Social History     Occupational History    Not on file   Tobacco Use    Smoking status: Never    Smokeless tobacco: Never   Substance and Sexual Activity    Alcohol use: Yes     Types: 2 Glasses of wine, 4 Cans of beer, 6 Standard drinks or equivalent per week    Drug use: Never    Sexual activity: Not Currently     Partners: Female     Birth control/protection: None     REVIEW OF SYSTEMS     Review of Systems  Constitutional: he is oriented to person, place, and time and well-developed, well-nourished, and in no distress.   Head: Normocephalic and atraumatic.   Eyes: EOM are normal.    Neck: Neck supple.   Cardiovascular: Intact distal pulses.    Pulmonary/Chest: Effort normal. No respiratory distress.   Neurological: he is alert and oriented to person, place, and time.   Skin: Skin is dry. No rash noted. No erythema.   Psychiatric: Affect normal.     PHYSICAL EXAM     Vital Signs:  Visit Vitals  Ht 6\' 1"  (1.854 m)   Wt 127 kg (280 lb)   BMI 36.94 kg/m  Orthopedic Exam:    Right SHOULDER    Inspection:  No deformity, swelling or muscle wasting  No scapular  winging/dyskinesia    Palpation:  Tender to palpation over none    ROM:  90 abduction   85 ER  50 IR    Strength:   Supraspinatus: normal  Infraspinatus/Teres Minor: normal  Subscapularis: normal    Special Tests:  Positive Neer/Hawkins  Negative Sulcus Sign  Negative Lift Off test with  Positive Crossover test  Negative Empty Can test  Negative drop arm test  Negative Speeds Test  Negative Apprehension test    Neurovascular:   Neurovascularly intact distally    Cervical Spine Reveals normal ROM with no symptoms reproduced on Spurlings    IMAGING RESULTS     XR SHOULDER MINIMAL 2 VW RIGHT    Result Date: 12/31/2021  3 Views AP Axillary, and Grashey of the right shoulder are reviewed. There is moderate glenohumeral joint osteoarthritis. There is a Type 2 acromion. There is osteoarthritis of the [facility] Joint. No Loose bodies, no fractures, or other soft tissue pathology noted.  There is evidence of a prior subacromial decompression.  There is a large osteophyte off the inferior aspect of the humeral head and the smaller one off the inferior aspect of the glenoid Impression: Glenohumeral joint osteoarthritis right shoulder     IMPRESSION       ICD-10-CM ICD-9-CM    1. Primary osteoarthritis of right shoulder  M19.011 715.11 Ambulatory referral to Sports Medicine       PLAN     I have had a long discussion with Chad Rosario about treatment options. The potential risks and complications were discussed of each. Patient will be sent to Dr. Samuel Bouche for an injection under ultrasound guidance. All questions were answered, and he is comfortable with this treatment plan. Follow up PRN    Return if symptoms worsen or fail to improve.    The office note today was created using speech recognition software; as a result, errors in speech recognition may have occurred.     SCRIBE ATTESTATION     By signing my name below, I, Marcelino Freestone, attest that this documentation has been prepared under the direction and in the presence of Lajoyce Corners, MD.  Electronically signed: Marcelino Freestone, 12/31/2021, 10:14 AM    I, Lajoyce Corners, MD, personally performed the services described in this documentation. All medical record entries made by the scribe were at my direction and in my presence. I have reviewed the chart and agree that the record reflects my personal performance and is accurate and complete.    Electronically signed: Lajoyce Corners, MD  12/31/2021  12:50 PM       Electronically signed by Lorrin Goodell, MD at 12/31/2021 12:50 PM EDT

## 2022-01-09 NOTE — Progress Notes (Signed)
Formatting of this note is different from the original.  [id number] [name], MD    Patient ID: Chad Rosario is a 66 y.o. male.  The patient was identified using two red rule identifiers - CAROLINE ROBBINS, AT-C    Chief Complaint:  Chief Complaint   Patient presents with    Right Knee - Post-op     HISTORY OF PRESENT ILLNESS     Chad Rosario is a 66 y.o.  who presents today for postop Right Knee Arthroscopy partial Medial and Lateral menisectomy, Chondroplasty PF, MFC, Lat Comp and removal of loose bodies SX 01/03/22.  He comes in today ambulating with a cane.  He denies any drainage from his incisions or any other postoperative complications.  He states last night he had an episode where he was walking in the garage and just had a sharp pain, so has been less than 12 hours but he is having some sharp pain in the knee.  He states he has been compliant with his postoperative instructions and uses ice and elevation to help with his discomfort.    Pain Assessment  Pain Assessment: 0-10  Numerical Rating Scale (0-10) Score: 8  Pain Location: Knee  Pain Orientation: Right  Pain Descriptors: Discomfort;Tightness;Sharp  Pain Frequency: Intermittent  Pain Onset: Awakened from sleep  Clinical Progression: Gradually worsening  Aggravating Factors: Exercise;Sit to Stand;Walking;Standing;Stairs  Result of Injury: No  Work-Related Injury: No  Patient's Stated Pain Goal: No pain  Pain Interventions: Medication (See MAR)    Medications:  Current Outpatient Medications on File Prior to Visit   Medication Sig Dispense Refill    allopurinoL (use for ZYLOPRIM) 300 mg tablet Take 300 mg by mouth      alpha lipoic acid 200 mg capsule Take 300 mg by mouth      amitriptyline (use for ELAVIL) 10 mg tablet Take 1 tablet by mouth nightly      apixaban (use for ELIQUIS) 5 mg tablet Take 1 tablet by mouth in the morning and 1 tablet in the evening.      Bifidobacterium infantis (Align) 4 mg capsule Take 4 mg by mouth in the morning.       celecoxib (use for CeleBREX) 200 mg capsule       colchicine 0.6 mg capsule capsule Take 0.6 mg by mouth      fexofenadine (use for ALLEGRA) 180 mg tablet Take 1 tablet by mouth in the morning.      gabapentin (use for NEURONTIN) 300 mg capsule Take 300 mg by mouth      HYDROcodone-acetaminophen (use for NORCO 10-325 mg) 10-325 mg per tablet Take 1 tablet by mouth every 4 (four) hours as needed for moderate pain (score 5-7) Indications: Major Surgery  Take 1-2 tabs po every 4-6 hours prn pain 42 tablet 0    lisinopriL (use for ZESTRIL) 20 mg tablet Take 1 tablet by mouth in the morning.      omeprazole (use for PRILOSEC) 20 mg capsule Take 20 mg by mouth      scopolamine (use for TRANSDERM SCOP) patch 3 day Place 1 patch on the skin      traMADoL (use for ULTRAM) 50 mg tablet Take 1 tablet (50 mg total) by mouth every 4 (four) hours as needed for moderate pain (score 5-7) Indications: Major Surgery 40 tablet 0    zolpidem (use for AMBIEN) 10 mg tablet Take 10 mg by mouth       No current facility-administered medications on  file prior to visit.       Allergies:  Allergies   Allergen Reactions    Hydrocodone-Acetaminophen Anxiety and Itching    Losartan Shortness of breath    Amlodipine Other (see comments)     Fatigue    Other reaction(s): Other (See Comments)   Fatigue    Fatigue     Fatigue     Rosuvastatin GI intolerance and Other (see comments)     Other reaction(s): Constipation   Fatigue, constipation   Other reaction(s): Fatigue   Fatigue, constipation   Other reaction(s): Fatigue    Other reaction(s): Constipation   Fatigue, constipation   Other reaction(s): Fatigue    Fatigue, constipation   Other reaction(s): Fatigue    Atorvastatin Diarrhea    Ezetimibe GI intolerance and Other (see comments)     Other reaction(s): Constipation, Other (See Comments)   Constipation and decreased urine output   Constipation and decreased urine output   Other reaction(s): Constipation, Urine output low   Constipation and  decreased urine output   Constipation and decreased urine output   Other reaction(s): Constipation, Urine output low    Other reaction(s): Constipation, Other (See Comments)   Constipation and decreased urine output   Constipation and decreased urine output   Other reaction(s): Constipation, Urine output low    Constipation and decreased urine output    Constipation and decreased urine output   Other reaction(s): Constipation, Urine output low     PAST MEDICAL AND SOCIAL HISTORY      Past Medical History:   Diagnosis Date    Chronic dyspnea     Coronary artery disease 2015    CTS (carpal tunnel syndrome) 2019    Deep vein thrombosis (CMS/HCC) 2017    Fractures 2007    Hyperlipidemia     Hypertension 2010    Neuropathy     Scoliosis 2015    Sleep apnea     Thrombocytopenia (CMS/HCC)      Past Surgical History:   Procedure Laterality Date    BACK SURGERY  02/2019    CARDIAC SURGERY      CARPAL TUNNEL RELEASE  2019    CATARACT EXTRACTION Left     CHOLECYSTECTOMY      CORONARY ARTERY BYPASS GRAFT      FEMUR FRACTURE SURGERY  2007    FRACTURE SURGERY Left     LEFT TIB-FIB    JOINT REPLACEMENT  2018    Left Hip    KNEE ARTHROSCOPY  1999    LAMINECTOMY  2003 & 2015    MENISCECTOMY Right 01/03/2022    MM/LM RES, C P, MFC, LTP, REM [facility]    SHOULDER ARTHROSCOPY  2000    SPINAL FUSION  2020     Family History   Problem Relation Age of Onset    Diabetes Father      Social History     Occupational History    Not on file   Tobacco Use    Smoking status: Never    Smokeless tobacco: Never   Substance and Sexual Activity    Alcohol use: Yes     Types: 2 Glasses of wine, 4 Cans of beer, 6 Standard drinks or equivalent per week    Drug use: Never    Sexual activity: Not Currently     Partners: Female     Birth control/protection: None     REVIEW OF SYSTEMS     Review of Systems  Constitutional: he is oriented  to person, place, and time and well-developed, well-nourished, and in no distress.   Head: Normocephalic and atraumatic.    Eyes: EOM are normal.   Neck: Neck supple.   Cardiovascular: Intact distal pulses.    Pulmonary/Chest: Effort normal. No respiratory distress.   Neurological: he is alert and oriented to person, place, and time.   Skin: Skin is dry. No rash noted. No erythema.   Psychiatric: Affect normal.     PHYSICAL EXAM     Vital Signs:  Visit Vitals  Ht 6\' 1"  (1.854 m)   Wt 127 kg (280 lb)   BMI 36.94 kg/m       Orthopedic Exam:    Examination surgical incision is healing and consistent with normal anticipate wound healing. Incision is clean, dry, intact and edges well approximated.  There is no drainage present and no signs of infection and no redness.  Sutures are intact and are removed today, and Steri-Strips are applied.  There is no calf tenderness, and a negative homans sign. Normal sensation.      IMAGING RESULTS     IMPRESSION       ICD-10-CM ICD-9-CM    1. Complex tear of medial meniscus of right knee as current injury, subsequent encounter  S83.231D V58.89      2. Complex tear of lateral meniscus of right knee as current injury, initial encounter  S83.271A 836.1      3. Chondromalacia of right knee  M94.261 717.7      4. Loose body of right knee  M23.41 717.6        PLAN     His pictures are reviewed with him.  He is starting physical therapy tomorrow, we will continue to make sure this pain improves. All of his questions are answered and he is comfortable with this plan. He will follow-up in the office with Dr. in 3 weeks, or contact the office before if he has any complications.      Return in about 3 weeks (around 01/30/2022).    The office note today was created using speech recognition software; as a result, errors in speech recognition may have occurred.     Examined and scribed by 13/10/2021, OPA-C, ATC for Dr. Kem Parkinson. Roxanne Dingman, OPA-C, ATC attest that this documentation accurately reflects the work and decisions made by and in the presence of Lowell Bouton, MD for this patient.        Electronically signed by Lajoyce Corners, MD at 01/14/2022  8:20 AM EDT

## 2022-01-22 NOTE — Progress Notes (Signed)
Formatting of this note is different from the original.  Chad Rosario is a 66 y.o.  male who presents today for RT GH  injection at the request of Dr. Lorel Monaco.  Pain Scale Today: 7/10  Previous Injections: no  Helpful: N/A     PRE-PROCEDURE DIAGNOSIS: RT Glenohumeral Joint Arthritis    PROCEDURE DETAILS: Verbal consent was obtained for the procedure after discussion of risks including infection, bleeding, damage to surrounding structures, allergic reaction (steroid flare), pigment change, and fat atrophy. The correct shoulder and appropriate anatomic landmarks were identified under ultrasound. The skin was prepped with Chloraprep and a 22 gauge needle was then inserted into the glenohumeral joint under guidance from an posterior approach. 34ml of lidocaine without epinephrine and 36ml of Kenalog 40 mg was then injected. The needle was removed and the area cleansed and dressed.     ULTRASOUND GUIDANCE JUSTIFICATION: Ultrasound guidance was used given small anatomic window.     COMPLICATIONS: None. The patient tolerated the procedure well with minimal discomfort.    POST CARE INSTRUCTIONS: Post injections instructions given to patient including icing, activity modification, and signs/symptoms of infection to watch for.       Ultrasound guidance has been permanently recorded, and the report is included in the patient?s medical record.   Electronically signed by Sara Chu, MD at 01/22/2022 12:00 PM EDT

## 2022-01-28 NOTE — Telephone Encounter (Signed)
From: Susy Frizzle  To: Dr. Alvino Blood  Sent: 01/28/2022 11:03 AM EST  Subject: Med Refill    Greetings!    I need refills for:  Allopurinol 300mg  / 1 daily  Eliquis 5mg  / 2 times daily    Please send to [facility]  These are daily meds, so please order accordingly.    Thank You!

## 2022-01-31 MED ORDER — APIXABAN 5 MG PO TABS
5 MG | ORAL_TABLET | Freq: Two times a day (BID) | ORAL | 3 refills | Status: DC
Start: 2022-01-31 — End: 2022-07-17

## 2022-01-31 MED ORDER — ALLOPURINOL 300 MG PO TABS
300 MG | ORAL_TABLET | Freq: Every day | ORAL | 3 refills | Status: AC
Start: 2022-01-31 — End: ?

## 2022-02-07 ENCOUNTER — Encounter: Admit: 2022-02-07 | Discharge: 2022-02-07 | Payer: MEDICARE | Primary: Family Medicine

## 2022-02-07 DIAGNOSIS — Z23 Encounter for immunization: Secondary | ICD-10-CM

## 2022-02-07 DIAGNOSIS — R7401 Elevation of levels of liver transaminase levels: Secondary | ICD-10-CM

## 2022-02-07 LAB — CBC WITH AUTO DIFFERENTIAL
Absolute Immature Granulocyte: 0 10*3/uL (ref 0.0–0.5)
Basophils %: 1 % (ref 0.0–2.0)
Basophils Absolute: 0.1 10*3/uL (ref 0.0–0.2)
Eosinophils %: 4 % (ref 0.5–7.8)
Eosinophils Absolute: 0.4 10*3/uL (ref 0.0–0.8)
Hematocrit: 52 % — ABNORMAL HIGH (ref 41.1–50.3)
Hemoglobin: 18.6 g/dL — ABNORMAL HIGH (ref 13.6–17.2)
Immature Granulocytes: 1 % (ref 0.0–5.0)
Lymphocytes %: 20 % (ref 13–44)
Lymphocytes Absolute: 1.6 10*3/uL (ref 0.5–4.6)
MCH: 33.3 PG — ABNORMAL HIGH (ref 26.1–32.9)
MCHC: 35.8 g/dL — ABNORMAL HIGH (ref 31.4–35.0)
MCV: 93 FL (ref 82–102)
MPV: 12.9 FL — ABNORMAL HIGH (ref 9.4–12.3)
Monocytes %: 9 % (ref 4.0–12.0)
Monocytes Absolute: 0.7 10*3/uL (ref 0.1–1.3)
Neutrophils %: 66 % (ref 43–78)
Neutrophils Absolute: 5.4 10*3/uL (ref 1.7–8.2)
Platelets: 64 10*3/uL — AB (ref 150–450)
RBC: 5.59 M/uL (ref 4.23–5.6)
RDW: 14.3 % (ref 11.9–14.6)
WBC: 8.2 10*3/uL (ref 4.3–11.1)
nRBC: 0 10*3/uL (ref 0.0–0.2)

## 2022-02-08 LAB — LIPID PANEL
Chol/HDL Ratio: 3.6
Cholesterol, Total: 169 MG/DL (ref <200)
HDL: 47 MG/DL (ref 40–60)
LDL Calculated: 105.2 MG/DL — ABNORMAL HIGH (ref <100)
Triglycerides: 84 MG/DL (ref 35–150)
VLDL Cholesterol Calculated: 16.8 MG/DL (ref 6.0–23.0)

## 2022-02-08 LAB — HEMOGLOBIN A1C
Hemoglobin A1C: 4.9 % (ref 4.8–5.6)
eAG: 94 mg/dL

## 2022-02-08 LAB — AST: AST: 30 U/L (ref 15–37)

## 2022-02-08 NOTE — Progress Notes (Signed)
Associated Order(s): LG Arthrocentesis: L knee  Post-Procedure Diagnose(s): Complex tear of lateral meniscus of left knee as current injury, subsequent encounter; Complex tear of medial meniscus of left knee as current injury, subsequent encounter  Formatting of this note is different from the original.  JJO:ACZYSAY [name], MD    Patient ID: Chad Rosario is a 66 y.o. male.  The patient was identified using two red rule identifiers - CAROLINE ROBBINS, AT-C    Chief Complaint:  Chief Complaint   Patient presents with    Right Knee - Post-op     HISTORY OF PRESENT ILLNESS     Chad Rosario is a 66 y.o.  who presents today for postop Right Knee Arthroscopy partial Medial and Lateral menisectomy, Chondroplasty PF, MFC, Lat Comp and removal of loose bodies SX 01/03/22. He is also recheck left knee medial and lateral meniscal tears with an injection on 01/21/22. Patient presents today with a cane. He states the injection provided therapeutic relief for about 10 days. He would like another injection today. He reports continued improvement in his symptoms following surgery. He describes his pain as intermittent discomfort with a severity of 1/10. His right knee pain is worsened with bending and twisting. He still attends physical therapy and does his at home exercises. He notes some continued swelling in his knee. He is satisfied with his clinical course.     Pain Assessment  Pain Assessment: 0-10  Numerical Rating Scale (0-10) Score: 1  Pain Location: Knee  Pain Orientation: Right  Pain Descriptors: Discomfort  Pain Frequency: Intermittent  Pain Onset: Gradual  Clinical Progression: Gradually improving  Aggravating Factors: Exercise;Other (Comment);Bending (twisting)  Result of Injury: No  Work-Related Injury: No  Patient's Stated Pain Goal: No pain  Pain Interventions: Medication (See MAR)    Medications:  Current Outpatient Medications on File Prior to Visit   Medication Sig Dispense Refill    allopurinoL (use  for ZYLOPRIM) 300 mg tablet Take 300 mg by mouth      alpha lipoic acid 200 mg capsule Take 300 mg by mouth      amitriptyline (use for ELAVIL) 10 mg tablet Take 1 tablet by mouth nightly      apixaban (use for ELIQUIS) 5 mg tablet Take 1 tablet by mouth in the morning and 1 tablet in the evening.      Bifidobacterium infantis (Align) 4 mg capsule Take 4 mg by mouth in the morning.      celecoxib (use for CeleBREX) 200 mg capsule       colchicine 0.6 mg capsule capsule Take 0.6 mg by mouth      fexofenadine (use for ALLEGRA) 180 mg tablet Take 1 tablet by mouth in the morning.      gabapentin (use for NEURONTIN) 300 mg capsule Take 300 mg by mouth      HYDROcodone-acetaminophen (use for NORCO 10-325 mg) 10-325 mg per tablet Take 1 tablet by mouth every 4 (four) hours as needed for moderate pain (score 5-7) Indications: Major Surgery  Take 1-2 tabs po every 4-6 hours prn pain (Patient taking differently: Take 1 tablet by mouth every 4 (four) hours as needed for moderate pain (score 5-7)  Take 1-2 tabs po every 4-6 hours prn pain) 42 tablet 0    lisinopriL (use for ZESTRIL) 20 mg tablet Take 1 tablet by mouth in the morning.      omeprazole (use for PRILOSEC) 20 mg capsule Take 20 mg by mouth  scopolamine (use for TRANSDERM SCOP) patch 3 day Place 1 patch on the skin      traMADoL (use for ULTRAM) 50 mg tablet Take 1 tablet (50 mg total) by mouth every 4 (four) hours as needed for moderate pain (score 5-7) Indications: Major Surgery 40 tablet 0    zolpidem (use for AMBIEN) 10 mg tablet Take 10 mg by mouth       No current facility-administered medications on file prior to visit.       Allergies:  Allergies   Allergen Reactions    Hydrocodone-Acetaminophen Anxiety and Itching    Losartan Shortness of breath    Amlodipine Other (see comments)     Fatigue    Other reaction(s): Other (See Comments)   Fatigue    Fatigue     Fatigue     Rosuvastatin GI intolerance and Other (see comments)     Other reaction(s):  Constipation   Fatigue, constipation   Other reaction(s): Fatigue   Fatigue, constipation   Other reaction(s): Fatigue    Other reaction(s): Constipation   Fatigue, constipation   Other reaction(s): Fatigue    Fatigue, constipation   Other reaction(s): Fatigue    Atorvastatin Diarrhea    Ezetimibe GI intolerance and Other (see comments)     Other reaction(s): Constipation, Other (See Comments)   Constipation and decreased urine output   Constipation and decreased urine output   Other reaction(s): Constipation, Urine output low   Constipation and decreased urine output   Constipation and decreased urine output   Other reaction(s): Constipation, Urine output low    Other reaction(s): Constipation, Other (See Comments)   Constipation and decreased urine output   Constipation and decreased urine output   Other reaction(s): Constipation, Urine output low    Constipation and decreased urine output    Constipation and decreased urine output   Other reaction(s): Constipation, Urine output low     PAST MEDICAL AND SOCIAL HISTORY      Past Medical History:   Diagnosis Date    Chronic dyspnea     Coronary artery disease 2015    CTS (carpal tunnel syndrome) 2019    Deep vein thrombosis (CMS/HCC) 2017    Fractures 2007    Hyperlipidemia     Hypertension 2010    Neuropathy     Scoliosis 2015    Sleep apnea     Thrombocytopenia (CMS/HCC)      Past Surgical History:   Procedure Laterality Date    BACK SURGERY  02/2019    CARDIAC SURGERY      CARPAL TUNNEL RELEASE  2019    CATARACT EXTRACTION Left     CHOLECYSTECTOMY      CORONARY ARTERY BYPASS GRAFT      FEMUR FRACTURE SURGERY  2007    FRACTURE SURGERY Left     LEFT TIB-FIB    JOINT REPLACEMENT  2018    Left Hip    KNEE ARTHROSCOPY  1999    LAMINECTOMY  2003 & 2015    MENISCECTOMY Right 01/03/2022    MM/LM RES, C P, MFC, LTP, REM [facility]    SHOULDER ARTHROSCOPY  2000    SPINAL FUSION  2020     Family History   Problem Relation Age of Onset    Diabetes Father      Social History      Occupational History    Not on file   Tobacco Use    Smoking status: Never    Smokeless tobacco:  Never   Substance and Sexual Activity    Alcohol use: Yes     Types: 2 Glasses of wine, 4 Cans of beer, 6 Standard drinks or equivalent per week    Drug use: Never    Sexual activity: Not Currently     Partners: Female     Birth control/protection: None     REVIEW OF SYSTEMS     Review of Systems  Constitutional: he is oriented to person, place, and time and well-developed, well-nourished, and in no distress.   Head: Normocephalic and atraumatic.   Eyes: EOM are normal.   Neck: Neck supple.   Cardiovascular: Intact distal pulses.    Pulmonary/Chest: Effort normal. No respiratory distress.   Neurological: he is alert and oriented to person, place, and time.   Skin: Skin is dry. No rash noted. No erythema.   Psychiatric: Affect normal.     PHYSICAL EXAM     Vital Signs:  Visit Vitals  Ht 6' (1.829 m)   Wt 127 kg (280 lb)   BMI 37.97 kg/m       Orthopedic Exam:    Right Knee    INSPECTION  There is 1+ effusion   Incisions well healed     PALPATION  Nontender to palpation    ROM  0-95  No pain with Hip IR/ER    SPECIAL TESTS  Negative McMurray  Negative Anterior Drawer  Negative Lachman  Negative Sag Sign  Negative Varus stress 0,30  Negative Valgus stress, 0,30  Negative Pivot Shift  Negative Posterior Drawer  Negative Crepitus Through ROM  Negative Patella Grind  Negative J-Sign    Negative SLR    Left Knee    INSPECTION  There is no swelling     PALPATION  Nontender to palpation    ROM  Pain with flexion   No pain with Hip IR/ER    SPECIAL TESTS  Positive McMurray  Negative Anterior Drawer  Negative Lachman  Negative Sag Sign  Negative Varus stress 0,30  Negative Valgus stress, 0,30  Negative Pivot Shift  Negative Posterior Drawer  Negative Crepitus Through ROM  Negative Patella Grind  Negative J-Sign    Negative SLR    IMAGING RESULTS     No new images were obtained at today's office visit.    IMPRESSION        ICD-10-CM ICD-9-CM    1. Complex tear of lateral meniscus of right knee as current injury, subsequent encounter  S83.271D V58.89      2. Complex tear of medial meniscus of right knee as current injury, subsequent encounter  S83.231D V58.89      3. Complex tear of lateral meniscus of left knee as current injury, subsequent encounter  S83.272D V58.89      4. Complex tear of medial meniscus of left knee as current injury, subsequent encounter  S83.232D V58.89        PLAN     Right knee  May continue outpatient physical therapy 1-2 times a week and continue to do exercises on a daily basis as per the physical therapist.   Continue medications, ice, and compression stockings as needed  Increase activity as tolerated   Follow up in 1 month  All of his questions are answered and they are comfortable with this plan    Left knee  I have had a long discussion with [name] about treatment options. The potential risks and complications were discussed of each. Patient would like to proceed  with cortisone injection. All questions were answered, and he is comfortable with this treatment plan.     Return in about 1 month (around 03/10/2022).    The office note today was created using speech recognition software; as a result, errors in speech recognition may have occurred.     PROCEDURE NOTE      PRE-PROCEDURE DIAGNOSIS:   1. Complex tear of lateral meniscus of right knee as current injury, subsequent encounter    2. Complex tear of medial meniscus of right knee as current injury, subsequent encounter    3. Complex tear of lateral meniscus of left knee as current injury, subsequent encounter    4. Complex tear of medial meniscus of left knee as current injury, subsequent encounter        PROCEDURE DETAILS: Verbal consent was obtained for the procedure after discussion of risks including infection, bleeding, damage to surrounding structures, allergic reaction (steroid flare), pigment change, and fat atrophy. The Left knee was  identified. The knee was prepped with ChloraPrep and a 22 gauge needle was then inserted into the joint from an anteromedial approach. 2 ml of Marcaine and DepoMedrol 40 mg was then injected. The needle was removed and the area cleansed and dressed.     COMPLICATIONS: None. The patient tolerated the procedure well with minimal discomfort.    POST CARE INSTRUCTIONS: Post injections instructions given to patient including icing, activity modification, and signs/symptoms of infection to watch for.      LG Arthrocentesis: L knee on 02/08/2022 10:19 AM  Indications: pain and joint swelling  Details: 22 G needle, anteromedial approach  Medications: 40 mg methylPREDNISolone acetate 40 mg/mL  Outcome: tolerated well, no immediate complications    Bupivacaine 0.75% (7.5 mg/mL)  NDC: 09811-914-78  LOT: [id number]  EXP: 04/24/24    Procedure, treatment alternatives, risks and benefits explained, specific risks discussed. Consent was given by the patient. Immediately prior to procedure a time out was called to verify the correct patient, procedure, equipment, support staff and site/side marked as required. Patient was prepped and draped in the usual sterile fashion.     SCRIBE ATTESTATION     By signing my name below, I, Marcelino Freestone, attest that this documentation has been prepared under the direction and in the presence of Lajoyce Corners, MD.  Electronically signed: Marcelino Freestone, 02/08/2022, 9:58 AM    I, Lajoyce Corners, MD, personally performed the services described in this documentation. All medical record entries made by the scribe were at my direction and in my presence. I have reviewed the chart and agree that the record reflects my personal performance and is accurate and complete.    Electronically signed: Lajoyce Corners, MD  02/09/2022  1:54 PM       Electronically signed by Lorrin Goodell, MD at 02/09/2022  1:54 PM EST

## 2022-03-12 NOTE — ED Triage Notes (Signed)
Formatting of this note might be different from the original.  Patient complaining of lower right sided back pain that started on Friday and progressed. Patient reports numbness in lower extremities and nausea.  Patient denies any loss of control of bowel or bladder. Patient states he was bending over in March and states he broke one of the screws from his previous surgery but was told they would leave it alone unless it caused problems.Patient states he had back fusion surgery 2 years ago.   Electronically signed by Octavia Heir, RN at 03/12/2022  8:17 AM EST

## 2022-03-13 NOTE — Progress Notes (Signed)
Formatting of this note is different from the original.  HCW:CBJSEGB [name], MD    Patient ID: Chad Rosario is a 66 y.o. male.  The patient was identified using two red rule identifiers - Chad Rosario, AT-C    Chief Complaint:  Chief Complaint   Patient presents with    Right Knee - Post-op     HISTORY OF PRESENT ILLNESS     Chad Rosario is a 66 y.o.  who presents today for  postop Right Knee Arthroscopy partial Medial and Lateral menisectomy, Chondroplasty PF, MFC, Lat Comp and removal of loose bodies SX 01/03/22. Patient presents today with a cane. He reports continued improvement in his symptoms since his last visit. He describes his pain as intermittent discomfort. His right knee pain is worsened with exercise. He still attends physical therapy and does his at home exercises. He notes some difficulty with walking on uneven surfaces due to his neuropathy. He is satisfied with his clinical course.     Pain Assessment  Pain Assessment: 0-10  Numerical Rating Scale (0-10) Score: 0 - No pain  Pain Location: Knee  Pain Orientation: Right  Pain Descriptors: Discomfort  Pain Frequency: Intermittent  Pain Onset: Gradual  Clinical Progression: Gradually improving  Aggravating Factors: Exercise  Result of Injury: No  Work-Related Injury: No  Patient's Stated Pain Goal: No pain  Pain Interventions: Medication (See MAR)    Medications:  Current Outpatient Medications on File Prior to Visit   Medication Sig Dispense Refill    allopurinoL (use for ZYLOPRIM) 300 mg tablet Take 300 mg by mouth      alpha lipoic acid 200 mg capsule Take 300 mg by mouth      amitriptyline (use for ELAVIL) 10 mg tablet Take 1 tablet by mouth nightly      apixaban (use for ELIQUIS) 5 mg tablet Take 1 tablet by mouth in the morning and 1 tablet in the evening.      Bifidobacterium infantis (Align) 4 mg capsule Take 4 mg by mouth in the morning.      celecoxib (use for CeleBREX) 200 mg capsule       colchicine 0.6 mg capsule capsule Take  0.6 mg by mouth      fexofenadine (use for ALLEGRA) 180 mg tablet Take 1 tablet by mouth in the morning.      gabapentin (use for NEURONTIN) 300 mg capsule Take 300 mg by mouth      HYDROcodone-acetaminophen (use for NORCO 10-325 mg) 10-325 mg per tablet Take 1 tablet by mouth every 4 (four) hours as needed for moderate pain (score 5-7) Indications: Major Surgery  Take 1-2 tabs po every 4-6 hours prn pain (Patient taking differently: Take 1 tablet by mouth every 4 (four) hours as needed for moderate pain (score 5-7)  Take 1-2 tabs po every 4-6 hours prn pain) 42 tablet 0    lisinopriL (use for ZESTRIL) 20 mg tablet Take 1 tablet by mouth in the morning.      omeprazole (use for PRILOSEC) 20 mg capsule Take 20 mg by mouth      scopolamine (use for TRANSDERM SCOP) patch 3 day Place 1 patch on the skin      traMADoL (use for ULTRAM) 50 mg tablet Take 1 tablet (50 mg total) by mouth every 4 (four) hours as needed for moderate pain (score 5-7) Indications: Major Surgery 40 tablet 0    zolpidem (use for AMBIEN) 10 mg tablet Take 10 mg by mouth  No current facility-administered medications on file prior to visit.       Allergies:  Allergies   Allergen Reactions    Hydrocodone-Acetaminophen Anxiety and Itching    Losartan Shortness of breath    Amlodipine Other (see comments)     Fatigue    Other reaction(s): Other (See Comments)   Fatigue    Fatigue     Fatigue     Rosuvastatin GI intolerance and Other (see comments)     Other reaction(s): Constipation   Fatigue, constipation   Other reaction(s): Fatigue   Fatigue, constipation   Other reaction(s): Fatigue    Other reaction(s): Constipation   Fatigue, constipation   Other reaction(s): Fatigue    Fatigue, constipation   Other reaction(s): Fatigue    Atorvastatin Diarrhea    Ezetimibe GI intolerance and Other (see comments)     Other reaction(s): Constipation, Other (See Comments)   Constipation and decreased urine output   Constipation and decreased urine output    Other reaction(s): Constipation, Urine output low   Constipation and decreased urine output   Constipation and decreased urine output   Other reaction(s): Constipation, Urine output low    Other reaction(s): Constipation, Other (See Comments)   Constipation and decreased urine output   Constipation and decreased urine output   Other reaction(s): Constipation, Urine output low    Constipation and decreased urine output    Constipation and decreased urine output   Other reaction(s): Constipation, Urine output low     PAST MEDICAL AND SOCIAL HISTORY      Past Medical History:   Diagnosis Date    Chronic dyspnea     Coronary artery disease 2015    CTS (carpal tunnel syndrome) 2019    Deep vein thrombosis (CMS/HCC) 2017    Fractures 2007    Hyperlipidemia     Hypertension 2010    Neuropathy     Scoliosis 2015    Sleep apnea     Thrombocytopenia (CMS/HCC)      Past Surgical History:   Procedure Laterality Date    BACK SURGERY  02/2019    CARDIAC SURGERY      CARPAL TUNNEL RELEASE  2019    CATARACT EXTRACTION Left     CHOLECYSTECTOMY      CORONARY ARTERY BYPASS GRAFT      FEMUR FRACTURE SURGERY  2007    FRACTURE SURGERY Left     LEFT TIB-FIB    JOINT REPLACEMENT  2018    Left Hip    KNEE ARTHROSCOPY  1999    LAMINECTOMY  2003 & 2015    MENISCECTOMY Right 01/03/2022    MM/LM RES, C P, MFC, LTP, REM [facility]    SHOULDER ARTHROSCOPY  2000    SPINAL FUSION  2020     Family History   Problem Relation Age of Onset    Diabetes Father      Social History     Occupational History    Not on file   Tobacco Use    Smoking status: Never    Smokeless tobacco: Never   Substance and Sexual Activity    Alcohol use: Yes     Types: 2 Glasses of wine, 4 Cans of beer, 6 Standard drinks or equivalent per week    Drug use: Never    Sexual activity: Not Currently     Partners: Female     Birth control/protection: None     REVIEW OF SYSTEMS     Review of Systems  Constitutional: he is oriented to person, place, and time and well-developed,  well-nourished, and in no distress.   Head: Normocephalic and atraumatic.   Eyes: EOM are normal.   Neck: Neck supple.   Cardiovascular: Intact distal pulses.    Pulmonary/Chest: Effort normal. No respiratory distress.   Neurological: he is alert and oriented to person, place, and time.   Skin: Skin is dry. No rash noted. No erythema.   Psychiatric: Affect normal.     PHYSICAL EXAM     Vital Signs:  Visit Vitals  Ht 6\' 1"  (1.854 m)   Wt 132 kg (290 lb)   BMI 38.26 kg/m       Orthopedic Exam:    Right Knee    INSPECTION  There is no effusion     PALPATION  Nontender to palpation    ROM  0-95  No pain with Hip IR/ER    SPECIAL TESTS  Negative McMurray  Negative Anterior Drawer  Negative Lachman  Negative Sag Sign  Negative Varus stress 0,30  Negative Valgus stress, 0,30  Negative Pivot Shift  Negative Posterior Drawer  Negative Crepitus Through ROM  Negative Patella Grind  Negative J-Sign    Negative SLR    IMAGING RESULTS     No new images were obtained at today's office visit.    IMPRESSION       ICD-10-CM ICD-9-CM    1. Complex tear of lateral meniscus of right knee as current injury, subsequent encounter  S83.271D V58.89      2. Complex tear of medial meniscus of right knee as current injury, subsequent encounter  S83.231D V58.89        PLAN     He would like to continue living with his symptoms for now and see how he does with the Physical Therapy over the next few months.   Return to all normal activities as tolerated.  If pain returns or worsens then we will see him  back and discuss more treatment options. All of his questions are answered and they are comfortable with this treatment plant. Otherwise, follow up as needed.     Return if symptoms worsen or fail to improve.    The office note today was created using speech recognition software; as a result, errors in speech recognition may have occurred.     SCRIBE ATTESTATION     By signing my name below, I, , attest that this documentation has been  prepared under the direction and in the presence of Marcelino Freestone, MD.  Electronically signed: Lajoyce Corners, 03/13/2022, 2:07 PM    I, 03/15/2022, MD, personally performed the services described in this documentation. All medical record entries made by the scribe were at my direction and in my presence. I have reviewed the chart and agree that the record reflects my personal performance and is accurate and complete.    Electronically signed: Lajoyce Corners, MD  03/14/2022  5:49 AM       Electronically signed by 03/16/2022, MD at 03/14/2022  5:49 AM EST

## 2022-03-19 ENCOUNTER — Encounter

## 2022-03-19 MED ORDER — GABAPENTIN 300 MG PO CAPS
300 MG | ORAL_CAPSULE | Freq: Three times a day (TID) | ORAL | 3 refills | Status: AC
Start: 2022-03-19 — End: 2023-04-22

## 2022-03-19 NOTE — Telephone Encounter (Signed)
Please send me a new order for optum as the insurance will not pay for script written on the same day.

## 2022-03-21 ENCOUNTER — Encounter

## 2022-03-21 MED ORDER — GABAPENTIN 300 MG PO CAPS
300 MG | ORAL_CAPSULE | Freq: Three times a day (TID) | ORAL | 0 refills | Status: DC
Start: 2022-03-21 — End: 2022-07-17

## 2022-03-26 ENCOUNTER — Ambulatory Visit: Admit: 2022-03-26 | Discharge: 2022-03-26 | Payer: MEDICARE | Attending: Family Medicine | Primary: Family Medicine

## 2022-03-26 DIAGNOSIS — R051 Acute cough: Secondary | ICD-10-CM

## 2022-03-26 MED ORDER — BENZONATATE 100 MG PO CAPS
100 MG | ORAL_CAPSULE | Freq: Two times a day (BID) | ORAL | 0 refills | Status: AC | PRN
Start: 2022-03-26 — End: 2022-04-02

## 2022-03-26 MED ORDER — AMOXICILLIN-POT CLAVULANATE 875-125 MG PO TABS
875-125 MG | ORAL_TABLET | Freq: Two times a day (BID) | ORAL | 0 refills | Status: AC
Start: 2022-03-26 — End: 2022-04-05

## 2022-03-27 NOTE — Progress Notes (Signed)
Formatting of this note is different from the original.  YIR:SWNIOEV Chad Rota, MD    Patient ID: Chad Rosario is a 67 y.o. male.  The patient was identified using two red rule identifiers - Chad Rosario,Chad Rosario, Chad Rosario    Chief Complaint:  Chief Complaint   Patient presents with    Left Knee - Follow-up     HISTORY OF PRESENT ILLNESS     Chad Rosario is a 67 y.o.  who presents today for preoperative examination. He is seen by Dr. Carlynn Spry for a medial meniscus tear of the left knee. He is scheduled for a left knee arthroscopy with medial menisectomy root repair on 04/04/22. Overall, he is doing well. He still continues to have pain on a regular basis with intermittent good days and bad days. His pain increases with activity, especially exacerbated by going up and down steps, squatting, twisting, pivoting and getting in and out of a chair. She has tried conservative measures, Anti-Inflammatories, activity modification, and cortisone injections with minimal relief. He denies any new injuries or worsening injuries, denies any new complaints or concerns. He denies any numbness, tingling, fever, chills, chest pain, shortness of breath or calf pain.     Pain Assessment  Pain Assessment: 0-10  Numerical Rating Scale (0-10) Score: 7  Pain Location: Knee  Pain Orientation: Left  Pain Descriptors: Discomfort;Sharp;Aching;Throbbing  Pain Frequency: Intermittent  Pain Onset: Gradual  Clinical Progression: Not changed  Aggravating Factors: Standing;Stairs;Walking;Sit to Stand  Result of Injury: No  Work-Related Injury: No  Patient's Stated Pain Goal: No pain    Medications:  Current Outpatient Medications on File Prior to Visit   Medication Sig Dispense Refill    allopurinoL (use for ZYLOPRIM) 300 mg tablet Take 300 mg by mouth      alpha lipoic acid 200 mg capsule Take 300 mg by mouth      amitriptyline (use for ELAVIL) 10 mg tablet Take 1 tablet by mouth nightly      amoxicillin-pot clavulanate (use for AUGMENTIN) 875-125 mg per  tablet Take 1 tablet by mouth      benzonatate (use for TESSALON) 100 mg capsule Take 100 mg by mouth      Bifidobacterium infantis (Align) 4 mg capsule Take 4 mg by mouth in the morning.      celecoxib (use for CeleBREX) 200 mg capsule       colchicine 0.6 mg capsule capsule Take 0.6 mg by mouth      fexofenadine (use for ALLEGRA) 180 mg tablet Take 1 tablet by mouth in the morning.      gabapentin (use for NEURONTIN) 300 mg capsule Take 300 mg by mouth      ketoconazole (use for NIZORAL) 2 % cream APPLY TO RASH ON GROIN TWICE DAILY.      lisinopriL (use for ZESTRIL) 20 mg tablet Take 1 tablet by mouth in the morning.      meloxicam (MOBIC) 15 mg tablet Take 1 tablet (15 mg total) by mouth in the morning. 30 tablet 0    omeprazole (use for PRILOSEC) 20 mg capsule Take 20 mg by mouth      scopolamine (use for TRANSDERM SCOP) patch 3 day Place 1 patch on the skin      tiZANidine (use for ZANAFLEX) 4 mg tablet Take 1 tablet (4 mg total) by mouth in the morning and 1 tablet (4 mg total) at noon and 1 tablet (4 mg total) in the evening. 90 tablet 0  traMADoL (use for ULTRAM) 50 mg tablet Take 1 tablet (50 mg total) by mouth every 4 (four) hours as needed for moderate pain (score 5-7) Indications: Major Surgery 40 tablet 0    apixaban (use for ELIQUIS) 5 mg tablet Take 1 tablet by mouth in the morning and 1 tablet in the evening.      HYDROcodone-acetaminophen (use for NORCO 10-325 mg) 10-325 mg per tablet Take 1 tablet by mouth every 4 (four) hours as needed for moderate pain (score 5-7) Indications: Major Surgery  Take 1-2 tabs po every 4-6 hours prn pain (Patient not taking: Reported on 03/27/2022) 42 tablet 0    zolpidem (use for AMBIEN) 10 mg tablet Take 10 mg by mouth       No current facility-administered medications on file prior to visit.       Allergies:  Allergies   Allergen Reactions    Hydrocodone-Acetaminophen Anxiety and Itching    Losartan Shortness of breath    Amlodipine Other (see comments)      Fatigue    Other reaction(s): Other (See Comments)   Fatigue    Fatigue     Fatigue     Rosuvastatin GI intolerance and Other (see comments)     Other reaction(s): Constipation   Fatigue, constipation   Other reaction(s): Fatigue   Fatigue, constipation   Other reaction(s): Fatigue    Other reaction(s): Constipation   Fatigue, constipation   Other reaction(s): Fatigue    Fatigue, constipation   Other reaction(s): Fatigue    Atorvastatin Diarrhea    Ezetimibe GI intolerance and Other (see comments)     Other reaction(s): Constipation, Other (See Comments)   Constipation and decreased urine output   Constipation and decreased urine output   Other reaction(s): Constipation, Urine output low   Constipation and decreased urine output   Constipation and decreased urine output   Other reaction(s): Constipation, Urine output low    Other reaction(s): Constipation, Other (See Comments)   Constipation and decreased urine output   Constipation and decreased urine output   Other reaction(s): Constipation, Urine output low    Constipation and decreased urine output    Constipation and decreased urine output   Other reaction(s): Constipation, Urine output low     PAST MEDICAL AND SOCIAL HISTORY      Past Medical History:   Diagnosis Date    Chronic dyspnea     Coronary artery disease 2015    CTS (carpal tunnel syndrome) 2019    Deep vein thrombosis (CMS/HCC) 2017    Fractures 2007    Hyperlipidemia     Hypertension 2010    Neuropathy     Scoliosis 2015    Sleep apnea     Thrombocytopenia (CMS/HCC)      Past Surgical History:   Procedure Laterality Date    BACK SURGERY  02/2019    CARDIAC SURGERY      CARPAL TUNNEL RELEASE  2019    CATARACT EXTRACTION Left     CHOLECYSTECTOMY      CORONARY ARTERY BYPASS GRAFT      FEMUR FRACTURE SURGERY  2007    FRACTURE SURGERY Left     LEFT TIB-FIB    JOINT REPLACEMENT  2018    Left Hip    KNEE ARTHROSCOPY  1999    LAMINECTOMY  2003 & 2015    MENISCECTOMY Right 01/03/2022    MM/LM RES, C P, [facility],  LTP, REM [facility]    SHOULDER ARTHROSCOPY  2000  SPINAL FUSION  2020     Family History   Problem Relation Age of Onset    Diabetes Father      Social History     Occupational History    Not on file   Tobacco Use    Smoking status: Never    Smokeless tobacco: Never   Substance and Sexual Activity    Alcohol use: Yes     Types: 2 Glasses of wine, 4 Cans of beer, 6 Standard drinks or equivalent per week    Drug use: Never    Sexual activity: Not Currently     Partners: Female     Birth control/protection: None     REVIEW OF SYSTEMS     Review of Systems  Constitutional: he is oriented to person, place, and time and well-developed, well-nourished, and in no distress.   Head: Normocephalic and atraumatic.   Eyes: EOM are normal.   Neck: Neck supple.   Cardiovascular: Intact distal pulses.    Pulmonary/Chest: Effort normal. No respiratory distress.   Neurological: he is alert and oriented to person, place, and time.   Skin: Skin is dry. No rash noted. No erythema.   Psychiatric: Affect normal.     PHYSICAL EXAM     Vital Signs:  Visit Vitals  Ht 6\' 1"  (1.854 m)   Wt 132 kg (290 lb)   BMI 38.26 kg/m       Orthopedic Exam:    There are no changes in the orthopedic exam.    IMAGING RESULTS     EXAMINATION: MRI KNEE LEFT WITHOUT CONTRAST 01/18/2022 7:45 AM EDT    ACCESSION NUMBER: [id number]    COMPARISON: Radiographs performed January 16, 2022.    INDICATION: Left knee pain x3 weeks    TECHNIQUE: Dedicated MRI of the left knee was performed with multiplanar multiecho technique  Technical note: Evaluation is degraded by patient motion.    FINDINGS:    MEDIAL MENISCUS: High-grade radial type tear of the posterior root attachment with mild extrusion of the body segment.  LATERAL MENISCUS:  Intact.    ACL:  Intact.  PCL:  Intact.  MCL:  Intact.  LATERAL LIGAMENTS AND TENDONS:  Intact.    EXTENSOR MECHANISM:  The quadriceps and patellar tendons are intact.    FAT PADS:   Postsurgical scarring within Hoffa's fat.    CARTILAGE:     Patellofemoral compartment: Mild surface irregularity. Suggestion of partial-thickness chondral fissuring within the inferior aspect of the patellar apex.  Medial compartment: Mild diffuse chondral thinning and surface irregularity. Focus of full-thickness chondral loss at the mid weightbearing surface of the medial femoral condyle with subtle subchondral marrow reactive edema.  Lateral compartment: Intact.    BONE MARROW: Partially imaged intramedullary rod within the tibia with proximal fixation screw. No fracture or marrow replacing process.    OTHER: Small joint effusion. Mild anterior predominant subcutaneous soft tissue edema. Suggestion of mild fatty infiltration of the semimembranosus muscle.      IMPRESSION  IMPRESSION:    1.  High-grade radial type tear of the posterior root attachment medial meniscus with mild extrusion of the body segment.  2.  Mild osteoarthritic changes, as above, with a focus of full-thickness chondral fissuring within the medial femoral condyle.  3.  Cruciate and collateral ligaments are intact.  4.  Small joint effusion.    IMPRESSION       ICD-10-CM ICD-9-CM    1. Complex tear of medial meniscus of right knee as  current injury, subsequent encounter  S83.231D V58.89        PLAN     Tayari has failed conservative measures of Anti-Inflammatories, activity modification, and cortisone injections and they would like to proceed with surgical intervention.  He has elected to proceed with a left Knee Arthroscopy.   Plan for a partial medial menisectomy root repair and procedures as indicated. Risks of the surgery were reviewed, including but not limited to:  risks of infection, pain, bleeding, blood clots, damage to nerves and blood vessels, stiffness, numbness, swelling, and wound healing complications.  We discussed the use of antibiotics to prevent infection. We also discussed the medical necessity of a physician assistant intraoperatively. All of his questions are answered and we will  schedule this at his convenience.     Javarus is having some issues with his spinal fusion and is going to consult with the surgeon that did the fusion at [facility] before proceeded with knee surgery.    Return for Surgery.    The office note today was created using speech recognition software; as a result, errors in speech recognition may have occurred.     SCRIBE ATTESTATION     By signing my name below, I, Marcelino Freestone, attest that this documentation has been prepared under the direction and in the presence of Lajoyce Corners, MD.  Electronically signed: Marcelino Freestone, 03/27/2022, 11:47 AM    I, Lajoyce Corners, MD, personally performed the services described in this documentation. All medical record entries made by the scribe were at my direction and in my presence. I have reviewed the chart and agree that the record reflects my personal performance and is accurate and complete.    Electronically signed: Lajoyce Corners, MD  03/28/2022  5:57 AM       Electronically signed by Lorrin Goodell, MD at 03/28/2022  6:00 AM EST

## 2022-03-27 NOTE — Progress Notes (Signed)
Burns Timson  January 05, 1956 is a 67 y.o. male ,Established patient, here for evaluation of the following chief complaint(s):   Chief Complaint   Patient presents with    Sinus Problem     SINUS PRESSURE, HEADACHE, DRAINAGE, COUGH X 3 WEEKS       Ear Problem     EAR RINGING           1. Acute cough  -     benzonatate (TESSALON) 100 MG capsule; Take 1 capsule by mouth 2 times daily as needed for Cough, Disp-14 capsule, R-0Normal  2. Acute bacterial sinusitis  -     amoxicillin-clavulanate (AUGMENTIN) 875-125 MG per tablet; Take 1 tablet by mouth 2 times daily for 10 days, Disp-20 tablet, R-0Normal        Return if symptoms worsen or fail to improve.        Subjective   SUBJECTIVE/OBJECTIVE:  Sinus Problem  This is a new problem. The current episode started in the past 7 days. The problem is unchanged. There has been no fever. Associated symptoms include congestion and coughing. Pertinent negatives include no chills, ear pain, headaches, shortness of breath or sore throat.   Ear Problem  Associated symptoms include congestion and coughing. Pertinent negatives include no abdominal pain, chest pain, chills, fever, headaches, joint swelling, myalgias, nausea, rash, sore throat or weakness.       Review of Systems   Constitutional:  Negative for chills and fever.   HENT:  Positive for congestion. Negative for ear pain, hearing loss and sore throat.    Eyes:  Negative for photophobia and pain.   Respiratory:  Positive for cough. Negative for shortness of breath.    Cardiovascular:  Negative for chest pain, palpitations and leg swelling.   Gastrointestinal:  Negative for abdominal distention, abdominal pain, blood in stool and nausea.   Genitourinary:  Negative for dysuria and urgency.   Musculoskeletal:  Negative for joint swelling and myalgias.   Skin:  Negative for color change, pallor and rash.   Neurological:  Negative for speech difficulty, weakness, light-headedness and headaches.   Hematological:  Negative for  adenopathy.   Psychiatric/Behavioral:  Negative for agitation, confusion, hallucinations, self-injury and suicidal ideas.           Objective   Physical Exam  Constitutional:       Appearance: Normal appearance.   HENT:      Head: Normocephalic and atraumatic.      Nose: Nose normal.   Eyes:      Extraocular Movements: Extraocular movements intact.      Conjunctiva/sclera: Conjunctivae normal.      Pupils: Pupils are equal, round, and reactive to light.   Cardiovascular:      Rate and Rhythm: Normal rate and regular rhythm.      Pulses: Normal pulses.      Heart sounds: Normal heart sounds.   Pulmonary:      Effort: Pulmonary effort is normal.      Breath sounds: Normal breath sounds.   Abdominal:      General: Abdomen is flat. Bowel sounds are normal.      Palpations: Abdomen is soft.   Skin:     General: Skin is warm and dry.   Neurological:      General: No focal deficit present.      Mental Status: He is alert and oriented to person, place, and time.   Psychiatric:         Mood and Affect:  Mood normal.                   An electronic signature was used to authenticate this note.    --Alvino Blood, MD

## 2022-04-12 NOTE — Progress Notes (Signed)
Formatting of this note might be different from the original.  DATE OF PROCEDURE: 03/24/2020  PROCEDURE PERFORMED:   1.  Open reduction internal fixation S1  2.  T10 to ilium instrumentation  3.  Stereotactic navigation  4.  Pelvic fixation  Drains:    1.  Deep JP coming out to the patient's left  2.  Superficial JP coming out to the patient's right    DATE OF PROCEDURE: 03/07/2020  STAGE I PROCEDURE PERFORMED (ANTERIOR):   1. Anterior lumbar diskectomy and interbody fusion via a retroperitoneal approach at L5-S1  2. Interbody cage at L5-S1    STAGE II PROCEDURE PERFORMED(POSTERIOR):  1. Posterior fusion at T10-S1  2. Posterior instrumentation from T10-S1  3.  Pelvic fixation  4. Stereotactic computer assisted navigation  5.  Morselized allograft  6.  Morselized autograft  7.  Posterior column osteotomies T10-T11, T11-T12, T12-L1, L1-L2, L2-L3  8.  Elevation of soft tissue flaps 30 cm x 4 cm x 2 flaps (240 cm)  9.  Removal of paddle leads from neurostimulator    DATE OF PROCEDURE: 03/06/2020  PROCEDURE PERFORMED:   1.  Removal of hardware with exploration of fusion L2-L5  2.  Removal of spinal cord stimulator pulse generator  3.  Posterior column osteotomies L5-S1  4.  Application of wound VAC 6 cm x 42 cm    SURGEON:  Belva Bertin, MD    Interval History    Very pleasant 67 year old male who comes in today for feeling really bad. He states he feels like he has had the flu for the last 3 weeks. He states he has had numerous surgeries & has never felt this bad. He feels like he is getting worse & feels that something is wrong. He is constantly cold with chills. Denies having a fever. He has difficulty sleeping. He saw PCP yesterday for insomnia. He has a lot of intermittent nausea. No vomiting. He has occasional headaches, but he feels this could be weather related. Denies the headaches being positional. Denies any drainage from incision. He states that he still has the "fluid build up in spine" & is wondering if  this could be making him feel this way. Denies any change in size. He is scheduled for appt next week, but he states he is miserable & concerned something could be wrong. Denies any covid exposure stating he has barely left the house.  He also notes that he recently had his gabapentin changed to Lyrica and has been treated for AOM with amoxicillin and then Bactrim. Ear feels better now.     Upon further review in the office today, he has lost 30 pounds since his surgery. His BP is low today. He is currently taking Lisinopril 10mg  daily. He further states that in the morning when he first wakes, he feels okay, then eats breakfast and takes his medicine and then starts to feel bad.     07/19/20  Patient reports he is doing better compared to his previous visit.  He continues to have burning pain in the bilateral feet.  This is getting better with time.  He also has a moderate sized fluid pocket underneath the lumbar portion of his incision.    His back pain has resolved.  This was quite debilitating for him before surgery and is now doing very well    Patient is planning to move to [place], [place] soon to be closer to his family.     04/12/2022: [name].  Not having any significant pain but his orthopedic surgeon in [place] noticed he had a rod fracture.    Exam    Lower Extremity Sensory Exam:  Right leg: Sensation intact to light touch from L2-S1  Left leg: Sensation intact to light touch from L2-S1    Lower Extremity Motor Exam:                                                      Right        Left  Hip flexion(L2-L3)                       5              5  Knee extension(L3-L4)               5              5  Ankle dorsiflexion (L4)                          4                5  Great toe extension(L5)              5              5  Foot eversion(L5)                        5              5  Ankle plantarflexion(S1)             5              5    All incisions are healing nicely.  They are all dry  and closed.  There is a seroma under caudal end of his lumbar incision which is significantly improved in size since last examination. No erythema, drainage, or other indications of infection.     Imaging    CT and x-rays reviewed.  No change in alignment.    CT Thoracic & Lumbar  1. Postoperative change from posterior lateral fusion at T10 through the sacrum with pedicle screws and rods. The hardware is intact.  2. There is solid posterior arthrodesis at T12-L4 on the left and T12-L3 on the right.  3.There is solid anterior arthrodesis at L2-3, L3-4, L4-5 and L5-S1  4. There is lucency around the pedicle screws bilaterally at T10 and on the left at T11 consistent with hardware loosening.  5. No significant abnormality in the thoracic spine at T1-T9    Possible right lumbosacral rod fracture    Assessment & Plan  67 year old male overall doing well following surgery.  But has loosening of the T10 and T11 screws with fractures of the right T12 and L1 screws.  He would like to avoid surgery if at all possible and given his asymptomatic status I think that that is very reasonable.  I will perform a telehealth visit with him in 3 to 4 months    Patient Counseling  I have spent 27 minutes in evaluation, management, and counseling today. Greater than 50% of the time did relate to counseling regarding their  symptoms, diagnosis, management options, and logistics. Risks, benefits, and alternatives of the medications and treatment plan prescribed today were discussed, and patient expressed understanding. Plan follow-up as discussed or as needed if any worsening symptoms or change in condition.     Electronically signed by Larinda Buttery, MD at 04/12/2022  3:21 PM EST

## 2022-04-29 NOTE — Telephone Encounter (Signed)
See if he has enough eliquis until MARCH 5---MAYBE WE CAN SAMPLE UNTIL I SEE HIM---I cannot find out who dr Isaiah Blakes is

## 2022-05-02 NOTE — Telephone Encounter (Signed)
See how much he has left and GET SAMPLES

## 2022-05-02 NOTE — Telephone Encounter (Signed)
-----   Message from [facility] sent at 05/02/2022  8:22 AM EST -----  Subject: Message to Provider    QUESTIONS  Information for Provider? Patient calling back to tell Dr. Donney Rankins he   does not have enough Eliquis to last until March 5th.   ---------------------------------------------------------------------------  --------------  Rod Can INFO  [phone]; OK to leave message on voicemail,OK to respond with electronic   message via MyChart portal (only for patients who have registered MyChart   account)  ---------------------------------------------------------------------------  --------------  SCRIPT ANSWERS  Relationship to Patient? Self

## 2022-05-03 ENCOUNTER — Ambulatory Visit: Admit: 2022-05-03 | Discharge: 2022-05-03 | Payer: MEDICARE | Attending: Medical | Primary: Family Medicine

## 2022-05-03 ENCOUNTER — Encounter

## 2022-05-03 DIAGNOSIS — I82531 Chronic embolism and thrombosis of right popliteal vein: Secondary | ICD-10-CM

## 2022-05-03 MED ORDER — RIVAROXABAN 20 MG PO TABS
20 | ORAL_TABLET | Freq: Every day | ORAL | 1 refills | Status: DC
Start: 2022-05-03 — End: 2022-05-03

## 2022-05-03 MED ORDER — NYSTATIN 100000 UNIT/GM EX CREA
100000 UNIT/GM | CUTANEOUS | 1 refills | Status: AC
Start: 2022-05-03 — End: 2022-07-17

## 2022-05-03 MED ORDER — GABAPENTIN 600 MG PO TABS
600 | ORAL_TABLET | Freq: Four times a day (QID) | ORAL | 3 refills | Status: DC
Start: 2022-05-03 — End: 2024-02-13

## 2022-05-03 MED ORDER — RIVAROXABAN 20 MG PO TABS
20 MG | ORAL_TABLET | Freq: Every day | ORAL | 1 refills | Status: AC
Start: 2022-05-03 — End: 2022-05-08

## 2022-05-03 NOTE — Progress Notes (Incomplete)
[facility]  [address] [place] [address]  Phone: [phone]  Fax [phone]  Provider : Milford Cage PA-C      Patient: Chad Rosario  Date of Birth: 06/17/1955  Patient Age 67 y.o.  Patient sex: male  MEDICAL RECORD NUMBER [id number]  Visit Date:05/03/2022   Author:  Paula Compton L. Fermin Schwab    Family Practice Clinic Note     Chief complaint  Chad Rosario  is a 67 y.o. male who was seen on 05/03/22  9:50 AM  for the following reasons:    No chief complaint on file.      Current Medical problems addressed  Wound on bottom  ***  Chronic Dvt  since 2017-- patient has been on eliquis but the price has gone up and asking about changing to xeralto  06/07/2021 noted ir venogram   Details    Reading Physician Reading Date Result Priority   Lottie Dawson, MD  [phone] 06/08/2021      Narrative & Impression  PROCEDURE: Diagnostic Venography and venous recanalization with venous  angioplasty     Procedural Personnel  Attending physician(s): Lottie Dawson, M.D.     Pre-procedure diagnosis: Very pleasant 67 year old man with progressively  worsening right lower extremity swelling and pain since January, 2023.  History  significant for right lower extremity (common femoral, femoral, popliteal,  posterior tibial veins) DVT in 10/04/2015, occurring after one of his surgeries.   He has been on chronic anticoagulation with Xarelto 20 mg po q day since 2017.   I asked the patient to convert to Eliquis about one week ago.  He also takes a  baby aspirin daily since CABG in 2015.  He has been intermittently wearing  knee-high compression hose since vein evaluation by Dr. Mitchell Heir earlier  this year.  Ultrasound 05/02/2021 demonstrates nonocclusive, chronic thrombus in  the right femoral and popliteal veins.  Similar nonocclusive thrombus documented  in a report from 03/21/2020, 03/12/2020, 06/17/2019.  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  Complications:  No immediate complications..     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  There are well formed collateral  veins in the proximal calf and distal thigh, some draining into the profunda  femoral vein system.  The popliteal vein is duplicated, the larger diameter  channel is completely occluded, the smaller diameter channel is occluded above  the knee with collateral vein drainage to the profunda femoral vein.  Successful  recanalization from right posterior tibial vein to the small diameter popliteal  vein channel and then to the femoral vein.  Although patent, this circuit  utilizes the smaller diameter popliteal vein system; the larger diameter  popliteal vein could not be completely recanalized today.     Plan:  One hour bedrest.  Follow up:  Office in 2-3 weeks.  Recommendations:   Continue Eliquis.  Continue compression hose use daily.  Exercise daily.     If no improvement in right calf swelling and pain, discuss the possibility of  repeat venography with attempted recanalization of the chronically occluded,  presumably larger diameter, medial moiety of the popliteal vein.     03/21/20 ato [facility]  Impression    IMPRESSION:  Nonocclusive thrombus in the right mid and  distal femoral vein and the right popliteal vein. The thrombus in the right mid femoral vein is similar in appearance to the prior examinations but the popliteal vein thrombus has improved.    Normal left lower extremity deep venous system.    Electronically Signed by: Orlean Patten  TECHNIQUE: The veins of both lower extremities were interrogated from the visible common femoral vein to the distal popliteal vein.  The junction of the greater saphenous with the common femoral vein as well as the posterior tibial veins were evaluated.  Gray  scale, color, spectral, and doppler sonography was utilized and analyzed.   COMPARISON: Venous Doppler 03/12/2020. Venous Doppler 06/17/2019.    INDICATION: evaluation of DVT ; please compare to ultrasound on 03/12/20 and 06/17/19 - Is this chronic DVT on 03/12/20?,     FINDINGS: There is nonocclusive thrombus in the mid right femoral vein extending down into the distal right femoral vein. This appearance is similar to the 2 previous examinations and this may be chronic thrombus. There is also some nonocclusive thrombus in the right popliteal vein. This is demonstrated improvement when compared to the 2 prior examinations. The right common femoral vein and right calf veins are patent.     The left common femoral vein, femoral vein, popliteal vein, and calf veins are patent with normal spontaneous venous flow and good compressibility. No evidence of deep venous thrombosis on the left.     INCIDENTAL FINDINGS:  None   Narrative    ASSESSMENT AND PLAN  No diagnosis found.   There are no diagnoses linked to this encounter.  Plan includes the following:  ***  No follow-up provider specified.  No orders of the defined types were placed in this encounter.      Past Medical History:  Allergies:  Allergies   Allergen Reactions   . Hydrocodone-Acetaminophen Anxiety and Itching   . Losartan Shortness Of Breath   . Amlodipine Other (See Comments)     Other reaction(s): Other (See Comments)  Fatigue   Fatigue      . Rosuvastatin Other (See Comments)     Other reaction(s): Constipation  Fatigue, constipation  Other reaction(s): Fatigue   . Atorvastatin Diarrhea   . Ezetimibe      Other reaction(s): Constipation, Other (See Comments)  Constipation and decreased urine output  Constipation and decreased urine output  Other reaction(s): Constipation, Urine output low         Current Medications:   Medications marked "taking" at this time:  Current Outpatient Medications   Medication Sig Dispense Refill   . gabapentin (NEURONTIN) 300 MG  capsule Take 2 capsules by mouth 3 times daily for 120 days. 180 capsule 0   . apixaban (ELIQUIS) 5 MG TABS tablet Take 1 tablet by mouth 2 times daily 180 tablet 3   . allopurinol (ZYLOPRIM) 300 MG tablet Take 1 tablet by mouth daily 90 tablet 3   . omeprazole (PRILOSEC) 20 MG delayed release capsule Take 1 capsule by mouth every morning (before breakfast) 90 capsule 3   . Multiple Vitamins-Minerals (THERAPEUTIC MULTIVITAMIN-MINERALS) tablet Take 1 tablet by mouth daily     . vitamin D3 (CHOLECALCIFEROL) 10 MCG (400 UNIT) TABS tablet Take 2 tablets by mouth daily     . CPAP Machine MISC by Does not apply route     . predniSONE (DELTASONE) 10 MG tablet 2 tablets twice daily for 1 week, 1 tablet twice daily for 1 week, 1 tablet each morning for 1 week, 1/2  tablet each morning for 1 week then discontinue treatment 80 tablet 0   . celecoxib (CELEBREX) 200 MG capsule      . lisinopril (PRINIVIL;ZESTRIL) 20 MG tablet Take 1 tablet by mouth daily 90 tablet 3   . colchicine (MITIGARE) 0.6 MG capsule Take 1 capsule by mouth as needed for Pain 30 capsule 3   . Acetaminophen 500 MG CAPS Take 2 capsules by mouth as needed     . Alpha-Lipoic Acid 200 MG CAPS Take 300 mg by mouth     . amitriptyline (ELAVIL) 10 MG tablet nightly as needed     . aspirin 81 MG EC tablet Take 1 tablet by mouth daily     . azelastine (ASTELIN) 0.1 % nasal spray 1 spray by Nasal route as needed     . fexofenadine (ALLEGRA) 180 MG tablet Take 1 tablet by mouth daily       No current facility-administered medications for this visit.        Current Problem List:   Patient Active Problem List   Diagnosis   . Obesity (BMI 30-39.9)   . History of DVT (deep vein thrombosis)   . Hx of CABG   . Hypertension   . Hyperlipidemia   . Chronic dyspnea   . Thrombocytopenia, unspecified   . Neuropathic pain   . Paresthesia   . Weakness of lower extremity   . Foot drop, bilateral   . Cramps of lower extremity       Social History:   Social History     Tobacco Use   .  Smoking status: Never   . Smokeless tobacco: Never   Substance Use Topics   . Alcohol use: Yes     Alcohol/week: 12.0 standard drinks of alcohol     Types: 6 Cans of beer, 6 Shots of liquor per week     Comment: socially       Family History:   Family History   Problem Relation Age of Onset   . Cancer Mother         Lymp   . Stroke Father    . Cancer Sister         Breast       Surgical History:  Past Surgical History:   Procedure Laterality Date   . BACK SURGERY  2021    has had 3 different ones last was in 2021   . CARDIAC SURGERY      CABG   . CHOLECYSTECTOMY     . COLONOSCOPY N/A 11/29/2020    COLONOSCOPY POLYPECTOMY HOT SNARE performed by Drucie Opitz, MD at [facility]   . EYE SURGERY Left     cataract   . FRACTURE SURGERY Left     tib-fib fracture   . KNEE ARTHROSCOPY Right    . TOTAL HIP ARTHROPLASTY Left        ROS  Review of Systems    There were no vitals taken for this visit.  There is no height or weight on file to calculate BMI.     Physical Exam    Physical Exam     I have reviewed the patient's past medical history, social history and family history and vitals.  We have discussed treatment plan and follow up and given patient instructions.  Patient's questions are answered and we will follow up as indicated.  No follow-ups on file.     Ugonna Keirsey L. Konrad Dolores, PA-C  9:50 AM  05/03/2022

## 2022-05-03 NOTE — Progress Notes (Signed)
Med rf and adjust to 2400 mg daily gabapentin from 1800 mg daily -     600 mg QID.Marland KitchenMarland Kitchen   90 days [facility]

## 2022-05-06 ENCOUNTER — Encounter

## 2022-05-08 ENCOUNTER — Encounter

## 2022-05-08 MED ORDER — RIVAROXABAN 20 MG PO TABS
20 MG | ORAL_TABLET | Freq: Every day | ORAL | 1 refills | Status: AC
Start: 2022-05-08 — End: 2022-05-16

## 2022-05-08 NOTE — Telephone Encounter (Signed)
Rx resent

## 2022-05-08 NOTE — Telephone Encounter (Signed)
From: Susy Frizzle  To: Sheryle Hail  Sent: 05/06/2022 4:30 PM EST  Subject: Xarelto    Hi, thanks for your help Friday. [facility] tells me there's a problem with the Xarelto order you sent in. They say it concerns dosage instructions, but I can see on my account page that there are instruction. Who knows what happened. Could you call the Dr Assist line and clarify the problem? The number is [phone]    Thank you!

## 2022-05-13 ENCOUNTER — Encounter

## 2022-05-13 ENCOUNTER — Telehealth

## 2022-05-13 NOTE — Telephone Encounter (Signed)
Patient called stating that over the past 1 week he has noticed some increased swelling in the right lower extremity with a more purple-blue appearance to the lower shin and ankle.  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  He has previously had right lower extremity thrombectomy.  He indicates last Wednesday he bumped his shin and now has a small wound over the anterior portion of the shin that is not scabbing over or healing.  He also has chronic right knee problems with a medial meniscal tear.  He does feel that his knee is also more swollen.  We will order follow-up duplex ultrasound and see patient in clinic next week

## 2022-05-15 ENCOUNTER — Inpatient Hospital Stay: Admit: 2022-05-15 | Payer: MEDICARE | Primary: Family Medicine

## 2022-05-15 DIAGNOSIS — I82401 Acute embolism and thrombosis of unspecified deep veins of right lower extremity: Secondary | ICD-10-CM

## 2022-05-16 MED ORDER — RIVAROXABAN 20 MG PO TABS
20 | ORAL_TABLET | Freq: Every day | ORAL | 1 refills | Status: AC
Start: 2022-05-16 — End: ?

## 2022-05-16 NOTE — Telephone Encounter (Signed)
From: Sheryle Hail  To: Susy Frizzle  Sent: 05/08/2022 8:26 AM EST  Subject: rx    I resubmitted it please check back with them about it  [place]. Wynetta Emery, PA-C

## 2022-05-22 NOTE — Telephone Encounter (Signed)
POST OP RX    I have reviewed the [facility] database report for Chad Rosario (12-08-1955) as required for the prescription of controlled substances.  Electronically signed by: Rosine Door, MA 05/22/2022 12:20 PM

## 2022-05-23 NOTE — Unmapped (Signed)
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 male with left knee pain.  He was initially treated with conservative management with no improvement. He had an MRI scan which showed medial meniscal root tear and chondromalacia of the patellofemoral joint and medial femoral condyle left knee. He was given options of conservative management which include anti-inflammatories, cortisone injections and therapy, or the option of surgical intervention.  Due to continued pain and dysfunction he opted for surgical intervention.The risks and potential complications including but not limited to infections, neurovascular injury, and DVT were explained with the patient prior to procedure, and the rehab and recovery after surgery and he is comfortable with proceeding.    OPERATIVE NOTE:  The patient was taken to the operating room suite and placed in the operating room table in a supine position.  General anesthetic was administered.  After adequate anesthesia had been obtained an endotracheal tube was placed without difficulty.  A timeout  was performed the left knee was the correct knee on the permit and was previously marked by myself and the patient in preoperative holding.  He received 2 g of Ancef IV.  He will also be placed on 1 aspirin '325mg'$   twice daily for 3 weeks postoperatively for DVT prophylaxis.  The limb was then suspended and prepped and draped in usual fashion.  Standard inferomedial inferolateral superior medial portals made and the arthroscopic equipment was introduced into the knee.  The knee was examined in a sequential fashion.  There was no pathology noted in the suprapatellar pouch medial lateral gutters.  The quadricep tendon looked normal.  The patellofemoral joint showed grade II chondromalacia of the medial lateral facets.  Through an anterior medial and anterior lateral portal using arthroscopic shaver chondroplasty was performed back to stable cartilage.   There is no tilt or subluxation abnormality noted to the patella.  The arthroscope was then taken into the medial compartment.  There is a grade III-IV chondromalacia of the medial femoral condyle.  Using an arthroscopic shaver through the anterior medial and anterolateral portals a chondroplasty was performed back to stable cartilage.  There was a medial meniscal root equivalent tear.  I felt that this would be amenable to repair.  Using a [employer] meniscal air system we placed 1 anchor in the meniscal root and then went horizontally across the tear and placed the second anchor in the meniscus.  This was tightened down but the anchor in the  meniscal root pulled through the meniscus and did not get significant compression across to it.  We then did 2 more horizontal sutures in a similar fashion pulling the meniscus part into the posterior capsule.  This stabilized the meniscal tear.  The arthroscope was then taken to the intercondylar notch.  The anterior and posterior cruciate ligaments were probed and were intact.  The arthroscope was taken through the intercondylar  notch to view the medial meniscal root which is intact.  The arthroscope was then taken into the lateral compartment.  The lateral meniscus was probed and felt to be intact.  Lateral femoral condyle and lateral aspect of tibial plateau showed no articular damage.  The arthroscopic equipment was then removed from the knee.  The portals were closed with a 3-0 nylon.  A sterile dressing was applied.  General anesthetic was reversed and the patient was extubated. He tolerated the procedure well and there were no complications.    The assistant was utilized in the case with preoperative planning history and physical positioning the patient the table prepping draping the leg holding leg and the arthroscope during the operative portion procedure helping with wound closure application dressing and splint postoperatively.       The note today was created using speech recognition software; as a result, errors in speech recognition may have occurred.     Electronically signed: Alfonzo Feller M.D.  Date: 05/23/2022             Time: 11:14 AM        [place]

## 2022-05-24 ENCOUNTER — Encounter

## 2022-05-28 ENCOUNTER — Ambulatory Visit: Admit: 2022-05-28 | Discharge: 2022-05-28 | Payer: MEDICARE | Attending: Family | Primary: Family Medicine

## 2022-05-28 ENCOUNTER — Inpatient Hospital Stay: Admit: 2022-05-28 | Payer: MEDICARE | Primary: Family Medicine

## 2022-05-28 DIAGNOSIS — D696 Thrombocytopenia, unspecified: Secondary | ICD-10-CM

## 2022-05-28 LAB — COMPREHENSIVE METABOLIC PANEL
ALT: 44 U/L (ref 12–65)
AST: 39 U/L — ABNORMAL HIGH (ref 15–37)
Albumin/Globulin Ratio: 1.1 (ref 0.4–1.6)
Albumin: 3.8 g/dL (ref 3.2–4.6)
Alk Phosphatase: 60 U/L (ref 50–136)
Anion Gap: 5 mmol/L (ref 2–11)
BUN: 23 MG/DL (ref 8–23)
CO2: 26 mmol/L (ref 21–32)
Calcium: 9.1 MG/DL (ref 8.3–10.4)
Chloride: 106 mmol/L (ref 103–113)
Creatinine: 1.2 MG/DL (ref 0.8–1.5)
Est, Glom Filt Rate: >60 mL/min/{1.73_m2} (ref >60)
Globulin: 3.6 g/dL (ref 2.8–4.5)
Glucose: 133 mg/dL — ABNORMAL HIGH (ref 65–100)
Potassium: 4.5 mmol/L (ref 3.5–5.1)
Sodium: 137 mmol/L (ref 136–146)
Total Bilirubin: 1.4 MG/DL — ABNORMAL HIGH (ref 0.2–1.1)
Total Protein: 7.4 g/dL (ref 6.3–8.2)

## 2022-05-28 LAB — CBC WITH AUTO DIFFERENTIAL
Absolute Immature Granulocyte: 0 10*3/uL (ref 0.0–0.5)
Basophils %: 1 % (ref 0.0–2.0)
Basophils Absolute: 0.1 10*3/uL (ref 0.0–0.2)
Eosinophils %: 9 % — ABNORMAL HIGH (ref 0.5–7.8)
Eosinophils Absolute: 0.7 10*3/uL (ref 0.0–0.8)
Hematocrit: 49.3 % (ref 41.1–50.3)
Hemoglobin: 17.9 g/dL — ABNORMAL HIGH (ref 13.6–17.2)
Immature Granulocytes: 1 % (ref 0.0–5.0)
Lymphocytes %: 20 % (ref 13–44)
Lymphocytes Absolute: 1.4 10*3/uL (ref 0.5–4.6)
MCH: 33.4 PG — ABNORMAL HIGH (ref 26.1–32.9)
MCHC: 36.3 g/dL — ABNORMAL HIGH (ref 31.4–35.0)
MCV: 92 FL (ref 82.0–102.0)
MPV: 12.9 FL — ABNORMAL HIGH (ref 9.4–12.3)
Monocytes %: 10 % (ref 4.0–12.0)
Monocytes Absolute: 0.7 10*3/uL (ref 0.1–1.3)
Neutrophils %: 59 % (ref 43–78)
Neutrophils Absolute: 4.2 10*3/uL (ref 1.7–8.2)
Platelets: 64 10*3/uL — ABNORMAL LOW (ref 150–450)
RBC: 5.36 M/uL (ref 4.23–5.6)
RDW: 13.7 % (ref 11.9–14.6)
WBC: 7.1 10*3/uL (ref 4.3–11.1)
nRBC: 0 10*3/uL (ref 0.0–0.2)

## 2022-05-28 NOTE — Progress Notes (Signed)
[facility]: Office Visit Established Patient    Reason for follow up:    H/o DVT  thrombocytopenia      Oncologic overview: per [place] visit dated 07/07/19:    Secondary erythrocytosis   Hx of hemoglobin as high as 19   JAK2 negative; splenomegaly - mild   Bone marrow consistent with secondary erythrocytosis 7/16; JAK2, CALr, MPL on marrow negative    Splenomegaly with thrombocytopenia   Negative bone marrow 8/16   Hepatitis panel pending 12/16    DVT RLE 7/17   There was not enough blood to do the complete antiphospholipid testing. The lupus anticoagulant was positive which may be artifactual related to the presence of xarelto. Both factor V Leiden and the prothrombin gene mutation were absent        DIAGNOSIS     "BONE MARROW CORE BIOPSY, CLOT SECTION, ASPIRATE AND PERIPHERAL BLOOD   SMEAR:   - MILDLY HYPERCELLULAR MARROW WITH ERYTHROID HYPERPLASIA; SEE NOTE   - NO SIGNIFICANT MARROW FIBROSIS IDENTIFIED ON RETICULIN STAIN     NOTE: LABORATORY STUDIES ARE NOTABLE FOR AN ELEVATED ERYTHROPOIETIN   (28.2 MIU/ML, REF 2.6-18.5), AND NEGATIVE FOR MUTATIONS OF JAK2 V617F   ([facility]), JAK2 EXONS 12-14, CALRETICULIN, AND MPL. OVERALL, THE   FEATURES ARE MOST IN KEEPING WITH SECONDARY ERYTHROCYTOSIS.     THE CASE WAS DISCUSSED WITH DR. HINES AT 9:56 AM, 11/29/2014. "     Overview: (copied from prior)  "Mr. 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     In June 2022, patient relocated to [place] from [place] to be closer to her children.  He has establish care with PCP and cardiology already.  He screening colonoscopy was  completed by Dr. Marcelene Butte on 11/29/2020 with history of colon polyps.     Patient's presents today to discuss management of chronic anticoagulation.  He has been on anticoagulation for right lower extremity DVT diagnosed in July 2017.  He continues to take Xarelto 10 mg daily.  Based on history of coronary artery disease, he also takes aspirin 81 mg oral daily.     On evaluation today, he denies lower extremity pain or persistent swelling.  Right leg swells after a long drive.  He denies chest pain, unusual shortness of breath, nausea, vomiting, gum bleeds/nosebleeds/bloody stool/hematuria, abdominal pain, diarrhea or dysuria.  Denies fevers, drenching night sweats, early satiety, unintentional weight loss.  No falls, head trauma.     C/o mild macular rash on the dorsal aspect of left foot.  Also has chronic neuropathy involving bilateral feet-unchanged"      On 06/07/2021 he underwent PTV/PopV recanalization with PA (Dr. Judithann Sheen).     Interval history:  Mr. Friedlander returns today for six month follow up. He has been okay overall. He had recent left knee surgery for which he is recovering. 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 He is seeing vascular soon.  He denies chest pain, dizziness, nausea, vomiting, fever, black or bloody stools, spontaneous bleeding.  He has been tolerating Eliquis 5 mg BID but has become financially toxic so  he will be switching back to Xarelto soon.  He is established with GI for management of fatty liver/NASH.      Review of Systems:  14 point ROS was negative except as per HPI      ECOG PERFORMANCE STATUS - 0-Fully active, able to carry on all pre-disease performance without restriction.    Pain - /10. None/Minimal pain - not affecting QOL     Fatigue - Failed to redirect to the Timeline version of the REVFS SmartLink.  Distress -        No data to display                      Reviewed and updated this visit by provider:  Tobacco  Allergies  Meds  Problems  Med Hx  Surg Hx  Fam Hx          Current Outpatient Medications   Medication Sig Dispense Refill    famotidine (PEPCID) 40 MG tablet Take 1 tablet by mouth      rivaroxaban (XARELTO) 20 MG TABS tablet Take 1 tablet by mouth daily (with breakfast) 90 tablet 1    gabapentin (NEURONTIN) 600 MG tablet Take 1 tablet by mouth in the morning, at noon, in the evening, and at bedtime. 360 tablet 3    nystatin (MYCOSTATIN) 100000 UNIT/GM cream Apply topically 2 times daily. 30 g 1    allopurinol (ZYLOPRIM) 300 MG tablet Take 1 tablet by mouth daily 90 tablet 3    omeprazole (PRILOSEC) 20 MG delayed release capsule Take 1 capsule by mouth every morning (before breakfast) 90 capsule 3    Multiple Vitamins-Minerals (THERAPEUTIC MULTIVITAMIN-MINERALS) tablet Take 1 tablet by mouth daily      vitamin D3 (CHOLECALCIFEROL) 10 MCG (400 UNIT) TABS tablet Take 2 tablets by mouth daily      CPAP Machine MISC by Does not apply route      lisinopril (PRINIVIL;ZESTRIL) 20 MG tablet Take 1 tablet by mouth daily 90 tablet 3    colchicine (MITIGARE) 0.6 MG capsule Take 1 capsule by mouth as needed for Pain 30 capsule 3    Acetaminophen 500 MG CAPS Take 2 capsules by mouth as needed      Alpha-Lipoic Acid 200 MG CAPS Take 300 mg by mouth      amitriptyline (ELAVIL) 10 MG tablet nightly as needed      aspirin 81 MG EC tablet Take 1 tablet by mouth daily      azelastine (ASTELIN) 0.1 % nasal spray 1 spray by Nasal route as needed      fexofenadine (ALLEGRA) 180 MG tablet Take 1 tablet by mouth daily      gabapentin (NEURONTIN) 300 MG capsule Take 2 capsules by mouth 3 times daily for 120 days. (Patient not taking: Reported on 05/28/2022) 180 capsule 0    apixaban (ELIQUIS) 5 MG TABS tablet Take 1 tablet by mouth 2 times daily (Patient not taking: Reported on 05/28/2022) 180 tablet 3     No current facility-administered medications for this visit.         OBJECTIVE:  BP (!) 151/93   Pulse 56   Temp 99.5 F (37.5 C) (Oral) Comment: coffee  Resp 16   Ht 1.854 m ('6\' 1"'$ )   Wt 131.7 kg (290 lb 6.4 oz)   SpO2 96%   BMI 38.31 kg/m   Wt Readings from Last 3 Encounters:   05/28/22 131.7 kg (290 lb 6.4 oz)   05/03/22  133.1 kg (293 lb 6.4 oz)   03/26/22 131.1 kg (289 lb)       Physical Exam:  Patient alert and oriented x 3, in no acute distress  Integumentary: No Pallor, no icterus  HEENT: moist mucous membranes, normal oropharynx  Lymph nodes: no cervical or axillary LAD  Cardiovascular:RRR, S1, S2 present, no m/r/g   Lungs: Clear to auscultation, no rales or wheezing, no accessory muscle use  Abdomen: Soft, and non-tender on palpation, no organomegaly, bowel sounds audible  Extremities: Mild bilateral leg edema is noted.  No calf tenderness, Homans negative.  Neurological: patient can follow commands and move all extremities    Labs:  Lab Results   Component Value Date    WBC 7.1 05/28/2022    HGB 17.9 (H) 05/28/2022    HCT 49.3 05/28/2022    MCV 92.0 05/28/2022    PLT 64 (L) 05/28/2022     Lab Results   Component Value Date    NEUTROABS 4.2 05/28/2022    LYMPHOPCT 20 05/28/2022    LYMPHSABS 1.4 05/28/2022    MONOPCT 10 05/28/2022    MONOSABS 0.7 05/28/2022    EOSABS 0.7 05/28/2022    BASOPCT 1 05/28/2022     Lab Results   Component Value Date    NA 137 05/28/2022    K 4.5 05/28/2022    CL 106 05/28/2022    CO2 26 05/28/2022    BUN 23 05/28/2022    CREATININE 1.20 05/28/2022    GLUCOSE 133 (H) 05/28/2022    CALCIUM 9.1 05/28/2022    PROT 7.4 05/28/2022    LABALBU 3.8 05/28/2022    BILITOT 1.4 (H) 05/28/2022    ALKPHOS 60 05/28/2022    AST 39 (H) 05/28/2022    ALT 44 05/28/2022    LABGLOM >60 05/28/2022    GFRAA >60 12/18/2020    AGRATIO 1.1 05/28/2022    GLOB 3.6 05/28/2022     Date Obtained:   11/29/2020   DIAGNOSIS        A:  "ASCENDING COLON POLYPS":  FRAGMENTS OF MIXED TUBULOVILLOUS   ADENOMA.        B:  "TRANSVERSE COLON POLYP":  FRAGMENTS OF MIXED  TUBULOVILLOUS   ADENOMA.           sms/12/01/2020     Sign Out Date: 12/01/2020  J. Charmaine Downs., M.D.             Imaging:  XR THORACIC SPINE (3 VIEWS)    Result Date: 01/31/2021  1. Extensive postsurgical change without definite acute abnormality. 2. Fractured sternal wires seen within the upper sternum.    XR LUMBAR SPINE (MIN 4 VIEWS)    Result Date: 01/31/2021  1. Extensive postsurgical change without definite acute abnormality. 2. Fractured sternal wires seen within the upper sternum.    12/25/20: IMPRESSION:  No evidence of deep venous thrombosis in either lower extremity      07/24/21:IMPRESSION: US abdomen  -Mild hepatomegaly with diffusely increased hepatic echogenicity, likely  reflecting hepatic steatosis.  -Splenomegaly. Calcifications within the spleen, possibly reflecting sequelae of  prior granulomatous disease.    Problems:  1. History of DVT (deep vein thrombosis), right lower extremity   -Long-term anticoagulation with Xarelto  -Chronic mild thrombocytopenia, without evidence of excessive bleeding-this has been worked up in the past.  He has had a bone marrow biopsy in 2017.    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  -Seasonal allergies  -Hypertension  -Sleep  disturbance    PLAN:  I reviewed the results of most recent labs and imaging with the patient.  Platelet count is 61,000 and he has been switched to Eliquis 5 mg p.o. twice daily.  He is scheduled to undergo another procedure for management of superficial venous insufficiency of lower extremity in July.  Today again, I reviewed various possible causes of thrombocytopenia including but not limited to nutritional deficiencies, primary bone marrow conditions, ITP and chronic liver disease etc.  He will be referred to gastroenterology for evaluation of chronic liver disease as a possible contributor to thrombocytopenia.  His platelet counts should be closely monitored while on anticoagulation.  I have  therefore ordered CBC to be checked every 6 weeks.  Anticoagulation should be held for platelet count less than 50,000 or evidence of active bleeding.  I shall plan to see Mr. Anton back in about 3 months.  I have discussed the role of repeat bone marrow biopsy and further work-up should his platelet count decline further/other cytopenias develop.  -11/27/21: I reviewed the results of most recent labs with the patient.  Erythrocytosis is again noted.  Platelet count is 72,000.  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  He will continue taking aspirin 81 mg EC daily and Eliquis 5 mg p.o. twice daily.  Follow-up with [facility] as scheduled.  Encouraged weight loss.     05/28/22: Mr. Freehill returns today for six month follow up. He has been okay overall. He had recent left knee surgery for which he is recovering. 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 He is seeing vascular soon.  He denies chest pain, dizziness, nausea, vomiting, fever, black or bloody stools, spontaneous bleeding.  He has been tolerating Eliquis 5 mg BID but has become financially toxic so he will be switching back to Xarelto soon.  He is established with GI for management of fatty liver/NASH. Labs reviewed. Hgb remains mildly elevated but stable. Platetlets 64,000 which is low but stable from September/November results. He does see his PCP regularly. We discussed needing to hold his anticoagulation if his platelets drop below 50,000. Most likely cause of thrombocytopenia is liver disease - encouraged weight loss and avoidance of liver toxic substances like Tylenol/alcohol. Follow up in 6 months or sooner if needed.            Moshe Salisbury, NP-C  [facility]  [address] [place] [address]  Office : [phone]  Fax : [phone]

## 2022-06-03 MED ORDER — TRIAMCINOLONE ACETONIDE 0.025 % EX CREA
0.025 % | CUTANEOUS | 1 refills | Status: DC
Start: 2022-06-03 — End: 2022-07-17

## 2022-06-03 NOTE — Telephone Encounter (Signed)
From: Susy Frizzle  To: Sheryle Hail  Sent: 06/03/2022 5:15 AM EDT  Subject: Rash    Hi - the rash at the top of my buttocks is still there. It's not bleeding anymore, but it itches and looks raw.     Can we try another med or do you want to see me?

## 2022-06-04 MED ORDER — LISINOPRIL 20 MG PO TABS
20 MG | ORAL_TABLET | Freq: Every day | ORAL | 3 refills | Status: AC
Start: 2022-06-04 — End: 2022-07-22

## 2022-06-12 ENCOUNTER — Encounter: Admit: 2022-06-12 | Discharge: 2022-06-12 | Payer: MEDICARE | Attending: Diagnostic Radiology | Primary: Family Medicine

## 2022-06-12 DIAGNOSIS — I82501 Chronic embolism and thrombosis of unspecified deep veins of right lower extremity: Secondary | ICD-10-CM

## 2022-06-13 NOTE — Progress Notes (Signed)
[id number]  [place] [address]                            Progress Office Note    Tawhid Corso   February 13, 1956   06/12/22   Referring Provider: Mina Marble PA-C    Subjective:     Chief Complaint   Patient presents with    Other     History of DVT of distal lower extremity. Follow up to review Vascular duplex lower extremity venous right 05/15/22.      History of Present Illness:  67 year old male with history of chronic DVT of right lower extremity since 2017 who is compliant with his Eliquis.  Status post right lower extremity venous recanalization on 06-07-2021.  Patient has common femoral vein stenosis however this location is not ideal for stenting, therefore one was not previously placed.  Patient was seen in office for follow-up on 09-05-2021 and his symptoms had only mildly improved since his procedure.  He complained of a burning pain in both legs but worse along the right shin.  He continued to have mild swelling the right calf but had not been wearing his compression stockings.  We recommended walking daily and to wear compression stockings during the daytime.      He was seen again in our office on 01-16-2022 for follow-up.  Since that time he had seen an orthopedist in [place] and was found to have a right meniscal tear and underwent surgical repair.  He did not develop any worsening swelling after that initial surgery.  At that visit we recommended he see his neurologist for neuropathy as he also has chronic back issues and prior surgery.  Patient did follow-up with his neurologist and has since been placed on the maximum dosage of gabapentin which has helped his lower extremity neuropathy symptoms although he still c/o feeling that both legs are weak.      Patient called and spoke to me on 05-13-2022 stating that over the past week he had noticed some increased swelling in his right lower leg and a somewhat blue-purple appearance near his shin  and ankle.  A follow-up ultrasound was ordered and we are seeing patient today to discuss results.  Since then, patient underwent left knee meniscal repair on 05-23-2022.  He is doing very well postoperatively.  He admits he had not been wearing his compression hose up until recently (over the past 1 week) and has seen significant improvement in swelling in his right leg.  Patient had to hold his Eliquis perioperatively but is now back on his blood thinner.      Objective:     Allergies:    Allergies   Allergen Reactions    Hydrocodone-Acetaminophen Anxiety and Itching    Losartan Shortness Of Breath    Amlodipine Other (See Comments)     Other reaction(s): Other (See Comments)  Fatigue   Fatigue       Rosuvastatin Other (See Comments)     Other reaction(s): Constipation  Fatigue, constipation  Other reaction(s): Fatigue    Atorvastatin Diarrhea    Ezetimibe      Other reaction(s): Constipation, Other (See Comments)  Constipation and decreased urine output  Constipation and decreased urine output  Other reaction(s): Constipation, Urine output low          Medical History:    Past Medical History:   Diagnosis Date  CAD (coronary artery disease) 2015    Gout     Hypertension     controlled with meds    Neuropathy May 2019    Sleep apnea     cpap at night        Family History:    Family History   Problem Relation Age of Onset    Cancer Mother         Lymp    Stroke Father     Cancer Sister         Breast        Surgical History:   Past Surgical History:   Procedure Laterality Date    BACK SURGERY  2021    has had 3 different ones last was in 2021    CARDIAC SURGERY      CABG    CHOLECYSTECTOMY      COLONOSCOPY N/A 11/29/2020    COLONOSCOPY POLYPECTOMY HOT SNARE performed by Concepcion Living, MD at [place] Left     cataract    FRACTURE SURGERY Left     tib-fib fracture    KNEE ARTHROSCOPY Right     TOTAL HIP ARTHROPLASTY Left         Social History:    Social History       Tobacco History       Smoking  Status  Never      Smokeless Tobacco Use  Never              Alcohol History       Alcohol Use Status  Yes Drinks/Week  0 Glasses of wine, 6 Cans of beer, 6 Shots of liquor, 0 Drinks containing 0.5 oz of alcohol per week Amount  12.0 standard drinks of alcohol/wk Comment  socially              Drug Use       Drug Use Status  Never              Sexual Activity       Sexually Active  Not Currently Partners  Male                    Review of Systems:  Review of Systems   Musculoskeletal:         Minimal discomfort in left knee from recent meniscal repair.  Mild chronic swelling in the right lower leg and ankle which is improved from last office visit   All other systems reviewed and are negative.       Current Medications:    Current Outpatient Medications   Medication Sig Dispense Refill    lisinopril (PRINIVIL;ZESTRIL) 20 MG tablet TAKE 1 TABLET BY MOUTH DAILY 90 tablet 3    triamcinolone (KENALOG) 0.025 % cream Apply topically 2 times daily. 15 g 1    famotidine (PEPCID) 40 MG tablet Take 1 tablet by mouth      gabapentin (NEURONTIN) 600 MG tablet Take 1 tablet by mouth in the morning, at noon, in the evening, and at bedtime. 360 tablet 3    gabapentin (NEURONTIN) 300 MG capsule Take 2 capsules by mouth 3 times daily for 120 days. 180 capsule 0    apixaban (ELIQUIS) 5 MG TABS tablet Take 1 tablet by mouth 2 times daily 180 tablet 3    allopurinol (ZYLOPRIM) 300 MG tablet Take 1 tablet by mouth daily 90 tablet 3    omeprazole (PRILOSEC) 20 MG delayed  release capsule Take 1 capsule by mouth every morning (before breakfast) 90 capsule 3    Multiple Vitamins-Minerals (THERAPEUTIC MULTIVITAMIN-MINERALS) tablet Take 1 tablet by mouth daily      vitamin D3 (CHOLECALCIFEROL) 10 MCG (400 UNIT) TABS tablet Take 2 tablets by mouth daily      CPAP Machine MISC by Does not apply route      colchicine (MITIGARE) 0.6 MG capsule Take 1 capsule by mouth as needed for Pain 30 capsule 3    Acetaminophen 500 MG CAPS Take 2 capsules  by mouth as needed      Alpha-Lipoic Acid 200 MG CAPS Take 300 mg by mouth      aspirin 81 MG EC tablet Take 1 tablet by mouth daily      azelastine (ASTELIN) 0.1 % nasal spray 1 spray by Nasal route as needed      fexofenadine (ALLEGRA) 180 MG tablet Take 1 tablet by mouth daily      rivaroxaban (XARELTO) 20 MG TABS tablet Take 1 tablet by mouth daily (with breakfast) (Patient not taking: Reported on 06/12/2022) 90 tablet 1    nystatin (MYCOSTATIN) 100000 UNIT/GM cream Apply topically 2 times daily. (Patient not taking: Reported on 06/12/2022) 30 g 1    amitriptyline (ELAVIL) 10 MG tablet nightly as needed (Patient not taking: Reported on 06/12/2022)       No current facility-administered medications for this visit.        IMAGING: Independently reviewed on PACS.    Vascular US Duplex Lower Extremity Venous Right on 05/15/22:    FINDINGS: Chronic occlusive thrombus in the right femoral vein, as noted on  prior diagnostic venogram.  The right common femoral vein demonstrates normal  flow and compressibility.  There is a small amount of nonocclusive thrombus in  the popliteal vein, likely chronic.  Patent posterior tibial and peroneal veins.     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    Vitals:    06/12/22 1101   BP: (!) 143/88   Pulse: 64   Weight: 133.4 kg (294 lb)   Height: 1.854 m (6\' 1" )        Physical Exam:   CONSTITUTIONAL: well appearing and in NAD.  Uses a cane when ambulating   HEART: regular rate and rhythm, S1, S2 normal, no murmur, click, rub or gallop  LUNG:  clear to auscultation bilaterally  EXTREMITIES: Prior fasciotomy scar noted to left lower extremity.  Patient has minimal swelling noted to the right lower extremity which appears much improved from prior visit.  Mild venous stasis skin changes to the right lower extremity but no open ulcerations.   Skin appears dry and flaky.  Palpable dorsalis pedis pulses bilaterally.  Compartments are soft.  SKIN: No rash noted    Lab/Data Review:  LABORATORY VALUES: CBC with Differential:    Lab Results   Component Value Date/Time    WBC 7.1 05/28/2022 08:01 AM    RBC 5.36 05/28/2022 08:01 AM    HGB 17.9 05/28/2022 08:01 AM    HCT 49.3 05/28/2022 08:01 AM    PLT 64 05/28/2022 08:01 AM    MCV 92.0 05/28/2022 08:01 AM    MCH 33.4 05/28/2022 08:01 AM    MCHC 36.3 05/28/2022 08:01 AM    RDW 13.7 05/28/2022 08:01 AM    NRBC 0.00 05/28/2022 08:01 AM  LYMPHOPCT 20 05/28/2022 08:01 AM    MONOPCT 10 05/28/2022 08:01 AM    BASOPCT 1 05/28/2022 08:01 AM    MONOSABS 0.7 05/28/2022 08:01 AM    LYMPHSABS 1.4 05/28/2022 08:01 AM    EOSABS 0.7 05/28/2022 08:01 AM    BASOSABS 0.1 05/28/2022 08:01 AM    DIFFTYPE AUTOMATED 05/28/2022 08:01 AM     BMP:    Lab Results   Component Value Date/Time    NA 137 05/28/2022 08:01 AM    K 4.5 05/28/2022 08:01 AM    CL 106 05/28/2022 08:01 AM    CO2 26 05/28/2022 08:01 AM    BUN 23 05/28/2022 08:01 AM    LABALBU 3.8 05/28/2022 08:01 AM    CREATININE 1.20 05/28/2022 08:01 AM    CALCIUM 9.1 05/28/2022 08:01 AM    GFRAA >60 12/18/2020 09:12 AM    LABGLOM >60 05/28/2022 08:01 AM    GLUCOSE 133 05/28/2022 08:01 AM    GLUCOSE 127 11/21/2021 09:42 AM        Assessment:   1. Chronic deep vein thrombosis (DVT) of right lower extremity, unspecified vein (HCC)  2. Neuropathy    Pleasant 67 year old male with chronic back pain, peripheral neuropathy and chronic DVT since 2017 of the right lower extremity who underwent venous recanalization on 06-07-2021.  Patient has subsequently had bilateral meniscal repair with the most recent on the left performed 3 weeks ago.      Patient is here for follow-up today to discuss his most recent ultrasound which was obtained on 05-15-2022.  Ultrasound was obtained due to patient's concern of worsening swelling and discoloration to the leg which has now improved with use of  compression stockings.  Ultrasound reveals chronic occlusive thrombus in the right femoral vein which was noted on prior diagnostic venogram.  There is a small amount of nonocclusive thrombus in the popliteal vein, chronic in nature.  Patent posterior tibial and peroneal veins.  Since patient has more recently started wearing compression hose the swelling in his right leg from the knee to ankle looks much improved.  I do not appreciate any concerning blue or purple discoloration to the right leg.        Dr. Judithann Sheen reviewed his ultrasound and appreciates that his femoral vein is now occluded on the right but had been open 1 year ago.  There is concern that this cannot be recanalized due to the fact that he has a chronically occluded right popliteal vein, therefore access to the occluded femoral vein would most likely not be possible.  It was explained to patient that this could be attempted in the future if he developed significantly worsening swelling, pain, and discoloration in the right lower extremity; which he presently does not have.  Patient also understands that stent placement in the common femoral vein is not recommended due to the location and high risk for fracture.    Plan:   -Patient is to continue his anticoagulation and wear compression stockings during the daytime to help reduce ankle swelling in the right lower extremity    -Recommend he follow back up with his neurologist for his concern over bilateral leg weakness which we attribute to his neuropathy and not related to his chronic right lower extremity DVT        Return if symptoms worsen or fail to improve.     PLEASE NOTE:  38 MINUTES OF TIME WAS SPENT IN EVALUATION WITH > 50 % OF THIS TIME SPENT PROVIDING PATIENT  EDUCATION AND COUNSELING, REVIEW OF IMAGES, AND REVIEWING OTHER PROVIDER'S CHART NOTES.      Patient was seen independently by APP.  Case was discussed with Dr. Vita Erm, MD who also reviewed imaging, records and agrees  with management plan.    Signed By: Ernst Bowler, PA     June 13, 2022

## 2022-06-13 NOTE — Patient Instructions (Signed)
Please follow-up with your neurologist for your bilateral leg weakness which I attribute to your neuropathy.  Continue your blood thinner and wear your compression stockings during the daytime.  Contact us sooner if you develop worsening swelling, pain or discoloration in your right leg.

## 2022-07-15 ENCOUNTER — Encounter: Payer: MEDICARE | Primary: Family Medicine

## 2022-07-17 ENCOUNTER — Ambulatory Visit
Admit: 2022-07-17 | Discharge: 2022-07-17 | Payer: MEDICARE | Attending: Cardiovascular Disease | Primary: Family Medicine

## 2022-07-17 DIAGNOSIS — Z951 Presence of aortocoronary bypass graft: Secondary | ICD-10-CM

## 2022-07-17 NOTE — Progress Notes (Unsigned)
[facility] Follow Up                 Reason for Visit: CCD    Subjective:     Patient is a 67 y.o. male with a PMH of CAD status post CABG in 2015, statin intolerance, hypertension, hyperlipidemia, secondary erythrocytosis, chronic venous insufficiency and DVT who presents for follow-up.  The patient was last seen in September 2022.  The patient was referred to hematology to assist the need for continued anticoagulation.  The patient now follows with hematology with a history of DVT, secondary erythrocytosis, and thrombocytopenia.  According to documentation, he was referred to GI to evaluate for chronic liver disease.  However, according to documentation he has "fatty liver/NASH".  According to hematology, "most likely cause of thrombocytopenia is liver disease".    //Discontinue aspirin//    Past Medical History:   Diagnosis Date    CAD (coronary artery disease) 2015    Gout     Hypertension     controlled with meds    Neuropathy May 2019    Sleep apnea     cpap at night      Past Surgical History:   Procedure Laterality Date    BACK SURGERY  2021    has had 3 different ones last was in 2021    CARDIAC SURGERY      CABG    CHOLECYSTECTOMY      COLONOSCOPY N/A 11/29/2020    COLONOSCOPY POLYPECTOMY HOT SNARE performed by Drucie Opitz, MD at [facility] ENDOSCOPY    EYE SURGERY Left     cataract    FRACTURE SURGERY Left     tib-fib fracture    KNEE ARTHROSCOPY Right     TOTAL HIP ARTHROPLASTY Left       Family History   Problem Relation Age of Onset    Cancer Mother         Lymp    Stroke Father     Cancer Sister         Breast      Social History     Tobacco Use    Smoking status: Never    Smokeless tobacco: Never   Substance Use Topics    Alcohol use: Yes     Alcohol/week: 12.0 standard drinks of alcohol     Types: 6 Cans of beer, 6 Shots of liquor per week     Comment: socially      Allergies   Allergen Reactions    Hydrocodone-Acetaminophen Anxiety and Itching    Losartan Shortness Of Breath    Amlodipine Other  (See Comments)     Other reaction(s): Other (See Comments)  Fatigue   Fatigue       Rosuvastatin Other (See Comments)     Other reaction(s): Constipation  Fatigue, constipation  Other reaction(s): Fatigue    Atorvastatin Diarrhea    Ezetimibe      Other reaction(s): Constipation, Other (See Comments)  Constipation and decreased urine output  Constipation and decreased urine output  Other reaction(s): Constipation, Urine output low           ROS:  No obvious pertinent positives on review of systems except for what was outlined above.       Objective:       There were no vitals taken for this visit.    BP Readings from Last 3 Encounters:   06/12/22 (!) 143/88   05/28/22 (!) 151/93   05/03/22 138/76  Wt Readings from Last 3 Encounters:   06/12/22 133.4 kg (294 lb)   05/28/22 131.7 kg (290 lb 6.4 oz)   05/03/22 133.1 kg (293 lb 6.4 oz)       General/Constitutional:   Alert and oriented x 3, no acute distress  HEENT:   normocephalic, atraumatic, moist mucous membranes  Neck:   No JVD or carotid bruits bilaterally  Cardiovascular:   regular rate and rhythm, no rub/gallop appreciated  Pulmonary:   clear to auscultation bilaterally, no respiratory distress  Abdomen:   soft, non-tender, non-distended  Ext:   No sig LE edema bilaterally  Skin:  warm and dry, no obvious rashes seen  Neuro:   no obvious sensory or motor deficits  Psychiatric:   normal mood and affect      ECG: ***    Data Review:   Lab Results   Component Value Date    CHOL 169 02/07/2022    CHOL 165 11/21/2021    CHOL 185 07/10/2021     Lab Results   Component Value Date    TRIG 84 02/07/2022    TRIG 153 (H) 11/21/2021    TRIG 262 (H) 07/10/2021     Lab Results   Component Value Date    HDL 47 02/07/2022    HDL 42 07/10/2021    HDL 41 11/02/2020     Lab Results   Component Value Date    LDLCALC 105.2 (H) 02/07/2022    LDLCALC 90.6 07/10/2021    LDLCALC 95.6 11/02/2020     No results found for: "VLDL"  Lab Results   Component Value Date    CHOLHDLRATIO 3.6  02/07/2022    CHOLHDLRATIO 4.4 07/10/2021    CHOLHDLRATIO 3.8 11/02/2020        Lab Results   Component Value Date/Time    NA 137 05/28/2022 08:01 AM    NA 139 11/27/2021 08:01 AM    NA 138 08/15/2021 08:30 AM    K 4.5 05/28/2022 08:01 AM    K 4.6 11/27/2021 08:01 AM    K 4.6 08/15/2021 08:30 AM    CL 106 05/28/2022 08:01 AM    CL 108 11/27/2021 08:01 AM    CL 105 08/15/2021 08:30 AM    CO2 26 05/28/2022 08:01 AM    CO2 25 11/27/2021 08:01 AM    CO2 29 08/15/2021 08:30 AM    BUN 23 05/28/2022 08:01 AM    BUN 20 11/27/2021 08:01 AM    BUN 17 08/15/2021 08:30 AM    CREATININE 1.20 05/28/2022 08:01 AM    CREATININE 1.20 11/27/2021 08:01 AM    CREATININE 1.20 08/15/2021 08:30 AM    GLUCOSE 133 05/28/2022 08:01 AM    GLUCOSE 125 11/27/2021 08:01 AM    GLUCOSE 127 11/21/2021 09:42 AM    GLUCOSE 116 08/15/2021 08:30 AM    CALCIUM 9.1 05/28/2022 08:01 AM    CALCIUM 9.1 11/27/2021 08:01 AM    CALCIUM 9.1 08/15/2021 08:30 AM         Lab Results   Component Value Date    ALT 44 05/28/2022    ALT 44 11/27/2021    ALT 51 11/21/2021    AST 39 (H) 05/28/2022    AST 30 02/07/2022    AST 31 11/27/2021        Assessment/Plan:   There are no diagnoses linked to this encounter.    F/U: ***    [name], MD

## 2022-07-22 ENCOUNTER — Encounter

## 2022-07-22 ENCOUNTER — Ambulatory Visit: Admit: 2022-07-22 | Discharge: 2022-07-22 | Payer: MEDICARE | Attending: Medical | Primary: Family Medicine

## 2022-07-22 DIAGNOSIS — Z0000 Encounter for general adult medical examination without abnormal findings: Secondary | ICD-10-CM

## 2022-07-22 MED ORDER — LISINOPRIL 30 MG PO TABS
30 MG | ORAL_TABLET | Freq: Every day | ORAL | 1 refills | Status: DC
Start: 2022-07-22 — End: 2023-04-19

## 2022-07-22 MED ORDER — ZOLPIDEM TARTRATE 5 MG PO TABS
5 MG | ORAL_TABLET | Freq: Every evening | ORAL | 1 refills | Status: DC | PRN
Start: 2022-07-22 — End: 2023-01-20

## 2022-07-22 NOTE — Progress Notes (Signed)
[facility]  [address] [place] [address]  Phone: [phone]  Fax [phone]  Provider : Milford Cage PA-C      Patient: Amara Manalang  Date of Birth: 1955-09-24  Patient Age 67 y.o.  Patient sex: male  MEDICAL RECORD NUMBER [id number]  Visit Date:07/22/2022   Author:  Paula Compton L. Fermin Schwab    Family Practice Clinic Note     Chief complaint  Koty Anctil  is a 67 y.o. male who was seen on 07/22/22  9:06 AM  for the following reasons:    Chief Complaint   Patient presents with    Medicare AWV       Current Medical problems addressed  Hypertension elevated today will increase to 30mg  and recheck in 3 weeks as he is going on vacation.    He broke his leg and had compartment syndrome that left his with nerve damage and foot drop years ago . He has foot drop and has neuropathy in the feet for a long time.  He does see neurology and wants to discuss if there is anything that can be done to benefit his balance with therapy.  We discussed neurology has their own PT team or I can refer to our PT group.  He wants to wait and discuss it with neurologist as they are transitioning him.    He has trouble staying asleep and uses ambien from time to time but neurology has written that in the past and can't get into them recently.    He had colonoscopy in the last 6 month  ASSESSMENT AND PLAN    ICD-10-CM    1. Medicare annual wellness visit, subsequent  Z00.00       2. Neuropathy  G62.9       3. Balance disorder  R26.89       4. Obesity, Class III, BMI 40-49.9 (morbid obesity) (HCC)  E66.01       5. Primary insomnia  F51.01       6. Primary hypertension  I10          Milam was seen today for medicare awv.    Diagnoses and all orders for this visit:    Medicare annual wellness visit, subsequent    Neuropathy    Balance disorder    Obesity, Class III, BMI 40-49.9 (morbid obesity) (HCC)    Primary insomnia    Primary hypertension      Plan includes the following:    No follow-up provider specified.  No orders of  the defined types were placed in this encounter.      Past Medical History:  Allergies:  Allergies   Allergen Reactions    Hydrocodone-Acetaminophen Anxiety and Itching    Losartan Shortness Of Breath    Amlodipine Other (See Comments)     Other reaction(s): Other (See Comments)  Fatigue   Fatigue       Rosuvastatin Other (See Comments)     Other reaction(s): Constipation  Fatigue, constipation  Other reaction(s): Fatigue    Atorvastatin Diarrhea    Ezetimibe      Other reaction(s): Constipation, Other (See Comments)  Constipation and decreased urine output  Constipation and decreased urine output  Other reaction(s): Constipation, Urine output low         Current Medications:   Medications marked "taking" at this time:  Current Outpatient Medications   Medication Sig Dispense Refill    lisinopril (PRINIVIL;ZESTRIL) 20 MG tablet TAKE 1  TABLET BY MOUTH DAILY 90 tablet 3    famotidine (PEPCID) 40 MG tablet Take 1 tablet by mouth      rivaroxaban (XARELTO) 20 MG TABS tablet Take 1 tablet by mouth daily (with breakfast) 90 tablet 1    gabapentin (NEURONTIN) 600 MG tablet Take 1 tablet by mouth in the morning, at noon, in the evening, and at bedtime. 360 tablet 3    allopurinol (ZYLOPRIM) 300 MG tablet Take 1 tablet by mouth daily 90 tablet 3    omeprazole (PRILOSEC) 20 MG delayed release capsule Take 1 capsule by mouth every morning (before breakfast) 90 capsule 3    Multiple Vitamins-Minerals (THERAPEUTIC MULTIVITAMIN-MINERALS) tablet Take 1 tablet by mouth daily      vitamin D3 (CHOLECALCIFEROL) 10 MCG (400 UNIT) TABS tablet Take 2 tablets by mouth daily      CPAP Machine MISC by Does not apply route      colchicine (MITIGARE) 0.6 MG capsule Take 1 capsule by mouth as needed for Pain 30 capsule 3    Acetaminophen 500 MG CAPS Take 2 capsules by mouth as needed      Alpha-Lipoic Acid 200 MG CAPS Take 300 mg by mouth      azelastine (ASTELIN) 0.1 % nasal spray 1 spray by Nasal route as needed      fexofenadine (ALLEGRA)  180 MG tablet Take 1 tablet by mouth daily       No current facility-administered medications for this visit.        Current Problem List:   Patient Active Problem List   Diagnosis    Obesity (BMI 30-39.9)    History of DVT (deep vein thrombosis)    Hx of CABG    Hypertension    Hyperlipidemia    Chronic dyspnea    Thrombocytopenia (HCC)    Neuropathic pain    Paresthesia    Weakness of lower extremity    Foot drop, bilateral    Cramps of lower extremity       Social History:   Social History     Tobacco Use    Smoking status: Never    Smokeless tobacco: Never   Substance Use Topics    Alcohol use: Yes     Alcohol/week: 12.0 standard drinks of alcohol     Types: 6 Cans of beer, 6 Shots of liquor per week     Comment: socially       Family History:   Family History   Problem Relation Age of Onset    Cancer Mother         Lymp    Stroke Father     Cancer Sister         Breast    Clotting Disorder Maternal Grandfather        Surgical History:  Past Surgical History:   Procedure Laterality Date    BACK SURGERY  2021    has had 3 different ones last was in 2021    CARDIAC SURGERY      CABG    CHOLECYSTECTOMY      COLONOSCOPY N/A 11/29/2020    COLONOSCOPY POLYPECTOMY HOT SNARE performed by Drucie Opitz, MD at [facility] ENDOSCOPY    EYE SURGERY Left     cataract    FRACTURE SURGERY Left     tib-fib fracture    JOINT REPLACEMENT  Hip - 2018    KNEE ARTHROSCOPY Right     TOTAL HIP ARTHROPLASTY Left  ROS  Review of Systems   Constitutional:  Positive for fatigue. Negative for fever. Diaphoresis: poor endurance and saw cardiology.  Eyes:  Negative for visual disturbance.   Respiratory:  Negative for cough and shortness of breath.    Cardiovascular:  Negative for chest pain.   Gastrointestinal:  Negative for blood in stool.   Musculoskeletal:  Positive for arthralgias (had knee scopes bilatearally in last 6 months so just starting to walk). Negative for myalgias.   Skin:  Negative for rash.   Neurological:  Negative for syncope  and weakness.   Psychiatric/Behavioral:  Negative for dysphoric mood.        BP (!) 158/80   Pulse 60   Temp 98.6 F (37 C)   Ht 1.829 m (6')   Wt 133.8 kg (295 lb)   SpO2 96%   BMI 40.01 kg/m   Body mass index is 40.01 kg/m.     Physical Exam    Physical Exam  Vitals and nursing note reviewed.   Constitutional:       Appearance: Normal appearance.   Eyes:      Conjunctiva/sclera: Conjunctivae normal.   Cardiovascular:      Rate and Rhythm: Normal rate. Rhythm irregular.   Pulmonary:      Effort: Pulmonary effort is normal.      Breath sounds: Normal breath sounds.   Musculoskeletal:      Cervical back: Neck supple.      Right lower leg: No edema.      Left lower leg: No edema.   Neurological:      Mental Status: He is alert.   Psychiatric:         Mood and Affect: Mood normal.          I have reviewed the patient's past medical history, social history and family history and vitals.  We have discussed treatment plan and follow up and given patient instructions.  Patient's questions are answered and we will follow up as indicated.  Return in 1 year (on 07/22/2023).     Karissa Meenan L. Konrad Dolores, PA-C  9:06 AM  4/29/2024Medicare Annual Wellness Visit    Lion Fernandez is here for Medicare AWV    Assessment & Plan   Medicare annual wellness visit, subsequent  Neuropathy  Balance disorder  Obesity, Class III, BMI 40-49.9 (morbid obesity) (HCC)  Primary insomnia  The following orders have not been finalized:  -     zolpidem (AMBIEN) 5 MG tablet  Primary hypertension  The following orders have not been finalized:  -     lisinopril (PRINIVIL;ZESTRIL) 30 MG tablet  Recommendations for Preventive Services Due: see orders and patient instructions/AVS.  Recommended screening schedule for the next 5-10 years is provided to the patient in written form: see Patient Instructions/AVS.     Return in 1 year (on 07/22/2023).     Subjective   The following acute and/or chronic problems were also addressed today:  See note     Patient's  complete Health Risk Assessment and screening values have been reviewed and are found in Flowsheets. The following problems were reviewed today and where indicated follow up appointments were made and/or referrals ordered.    Positive Risk Factor Screenings with Interventions:    Fall Risk:  Do you feel unsteady or are you worried about falling? : (!) yes  2 or more falls in past year?: no  Fall with injury in past year?: no     Interventions:  Reviewed medications, home hazards, visual acuity, and co-morbidities that can increase risk for falls      Alcohol Screening:  Alcohol Use: Heavy Drinker (07/19/2022)    AUDIT-C     Frequency of Alcohol Consumption: 2-3 times a week     Average Number of Drinks: 3 or 4     Frequency of Binge Drinking: Never      AUDIT-C Score: 4   AUDIT Total Score: 5    Interpretation of AUDIT-C score:   3-7 indicates potential alcohol risk.    8 or more is associated with harmful or hazardous drinking.   13 or more in women, and 15 or more in men, is likely to indicate alcohol dependence.  Interventions:  Patient advised to follow up in the office for further evalution and treatment  Alcohol consumption guidelines reviewed. Moderation recommended              Activity, Diet, and Weight:  On average, how many days per week do you engage in moderate to strenuous exercise (like a brisk walk)?: 2 days  On average, how many minutes do you engage in exercise at this level?: 30 min    Do you eat balanced/healthy meals regularly?: Yes    Body mass index is 40.01 kg/m. (!) Abnormal    Obesity Interventions:  exercise for at least 150 minutes/week                        Objective   Vitals:    07/22/22 0842   BP: (!) 158/80   Pulse: 60   Temp: 98.6 F (37 C)   SpO2: 96%   Weight: 133.8 kg (295 lb)   Height: 1.829 m (6')      Body mass index is 40.01 kg/m.              Allergies   Allergen Reactions    Hydrocodone-Acetaminophen Anxiety and Itching    Losartan Shortness Of Breath    Amlodipine  Other (See Comments)     Other reaction(s): Other (See Comments)  Fatigue   Fatigue       Rosuvastatin Other (See Comments)     Other reaction(s): Constipation  Fatigue, constipation  Other reaction(s): Fatigue    Atorvastatin Diarrhea    Ezetimibe      Other reaction(s): Constipation, Other (See Comments)  Constipation and decreased urine output  Constipation and decreased urine output  Other reaction(s): Constipation, Urine output low       Prior to Visit Medications    Medication Sig Taking? Authorizing Provider   lisinopril (PRINIVIL;ZESTRIL) 20 MG tablet TAKE 1 TABLET BY MOUTH DAILY Yes [name], MD   famotidine (PEPCID) 40 MG tablet Take 1 tablet by mouth Yes [provider]   rivaroxaban (XARELTO) 20 MG TABS tablet Take 1 tablet by mouth daily (with breakfast) Yes Milford Cage L, PA-C   gabapentin (NEURONTIN) 600 MG tablet Take 1 tablet by mouth in the morning, at noon, in the evening, and at bedtime. Yes Cameron Proud, MD   allopurinol (ZYLOPRIM) 300 MG tablet Take 1 tablet by mouth daily Yes [name], MD   omeprazole (PRILOSEC) 20 MG delayed release capsule Take 1 capsule by mouth every morning (before breakfast) Yes Drucie Opitz, MD   Multiple Vitamins-Minerals (THERAPEUTIC MULTIVITAMIN-MINERALS) tablet Take 1 tablet by mouth daily Yes [provider]   vitamin D3 (CHOLECALCIFEROL) 10 MCG (400 UNIT) TABS tablet Take 2 tablets by  mouth daily Yes [provider]   CPAP Machine MISC by Does not apply route Yes [provider]   colchicine (MITIGARE) 0.6 MG capsule Take 1 capsule by mouth as needed for Pain Yes Lulu Riding, PA-C   Acetaminophen 500 MG CAPS Take 2 capsules by mouth as needed Yes [provider]   Alpha-Lipoic Acid 200 MG CAPS Take 300 mg by mouth Yes [provider]   azelastine (ASTELIN) 0.1 % nasal spray 1 spray by Nasal route as needed Yes [provider]   fexofenadine (ALLEGRA) 180 MG  tablet Take 1 tablet by mouth daily Yes [provider]       CareTeam (Including outside providers/suppliers regularly involved in providing care):   Patient Care Team:  [name], MD as PCP - General (Family Medicine)  [name], MD as PCP - Empaneled Provider  Hector Shade, MD as Hematology/Oncology (Hematology and Oncology)     Reviewed and updated this visit:  Tobacco  Allergies  Meds  Problems  Med Hx  Surg Hx  Soc Hx  Fam Hx      Patient has no ACP on file but reports they have one at home.  Advised the patient of the importance of bringing in a copy of their current ACP for [place] to to scan into the system.  Patient expresses understanding of this request.

## 2022-07-22 NOTE — Patient Instructions (Signed)
Preventing Falls: Care Instructions  Injuries and health problems such as trouble walking or poor eyesight can increase your risk of falling. So can some medicines. But there are things you can do to help prevent falls. You can exercise to get stronger. You can also arrange your home to make it safer.    Talk to your doctor about the medicines you take. Ask if any of them increase the risk of falls and whether they can be changed or stopped.   Try to exercise regularly. It can help improve your strength and balance. This can help lower your risk of falling.     Practice fall safety and prevention.    Wear low-heeled shoes that fit well and give your feet good support. Talk to your doctor if you have foot problems that make this hard.  Carry a cellphone or wear a medical alert device that you can use to call for help.  Use stepladders instead of chairs to reach high objects. Don't climb if you're at risk for falls. Ask for help, if needed.  Wear the correct eyeglasses, if you need them.    Make your home safer.    Remove rugs, cords, clutter, and furniture from walkways.  Keep your house well lit. Use night-lights in hallways and bathrooms.  Install and use sturdy handrails on stairways.  Wear nonskid footwear, even inside. Don't walk barefoot or in socks without shoes.    Be safe outside.    Use handrails, curb cuts, and ramps whenever possible.  Keep your hands free by using a shoulder bag or backpack.  Try to walk in well-lit areas. Watch out for uneven ground, changes in pavement, and debris.  Be careful in the winter. Walk on the grass or gravel when sidewalks are slippery. Use de-icer on steps and walkways. Add non-slip devices to shoes.    Put grab bars and nonskid mats in your shower or tub and near the toilet. Try to use a shower chair or bath bench when bathing.   Get into a tub or shower by putting in your weaker leg first. Get out with your strong side first. Have a phone or medical alert device in  the bathroom with you.   Where can you learn more?  Go to https://www.bennett.info/ and enter G117 to learn more about "Preventing Falls: Care Instructions."  Current as of: July 17, 2023Content Version: 14.0   2006-2024 Healthwise, Incorporated.   Care instructions adapted under license by [facility]. If you have questions about a medical condition or this instruction, always ask your healthcare professional. [place] any warranty or liability for your use of this information.           9 Ways to Cut Back on Drinking  Maybe you've found yourself drinking more alcohol than you'd prefer. If you want to cut back, here are some ideas to try.    Think before you drink.  Do you really want a drink, or is it just a habit? If you're used to having a drink at a certain time, try doing something else then.     Look for substitutes.  Find some no-alcohol drinks that you enjoy, like flavored seltzer water, tea with honey, or tonic with a slice of lime. Or try alcohol-free beer or "virgin" cocktails (without the alcohol).     Drink more water.  Use water to quench your thirst. Drink a glass of water before you have any alcohol. Have another glass along with  every drink or between drinks.     Shrink your drink.  For example, have a bottle of beer instead of a pint. Use a smaller glass for wine. Choose drinks with lower alcohol content (ABV%). Or use less liquor and more mixer in cocktails.     Slow down.  It's easy to drink quickly and without thinking about it. Pay attention, and make each drink last longer.     Do the math.  Total up how much you spend on alcohol each month. How much is that a year? If you cut back, what could you do with the money you save?     Take a break.  Choose a day or two each week when you won't drink at all. Notice how you feel on those days, physically and emotionally. How did you sleep? Do you feel better? Over time, add more break days.      Count calories.  Would you like to lose some weight? For some people that's a good motivator for cutting back. Figure out how many calories are in each drink. How many does that add up to in a day? In a week? In a month?     Practice saying no.  Be ready when someone offers you a drink. Try: "Thanks, I've had enough." Or "Thanks, but I'm cutting back." Or "No, thanks. I feel better when I drink less."   Current as of: November 15, 2023Content Version: 14.0   2006-2024 Healthwise, Incorporated.   Care instructions adapted under license by [facility]. If you have questions about a medical condition or this instruction, always ask your healthcare professional. [place] any warranty or liability for your use of this information.         Starting a Weight Loss Plan: Care Instructions  Overview     It can be a challenge to lose weight. But your doctor can help you make a weight-loss plan that meets your needs.  You don't have to make a lot of big changes at once. A better idea might be to focus on small changes and stick with them. When those changes become habit, you can add a few more changes.  Some people find it helpful to take an exercise or nutrition class. If you have questions, ask your doctor about seeing a registered dietitian or an exercise specialist. You might also think about joining a weight-loss support group.  If you're not ready to make changes right now, try to pick a date in the future. Then make an appointment with your doctor to talk about when and how you'll get started with a plan.  Follow-up care is a key part of your treatment and safety. Be sure to make and go to all appointments, and call your doctor if you are having problems. It's also a good idea to know your test results and keep a list of the medicines you take.  How can you care for yourself at home?  Set realistic goals. Many people expect to lose much more weight than is likely. A weight loss  of 5% to 10% of your body weight may be enough to improve your health.  Get family and friends involved to provide support. Talk to them about why you are trying to lose weight, and ask them to help. They can help by participating in exercise and having meals with you, even if they may be eating something different.  Find what works best for you. If you do not  have time or do not like to cook, a program that offers meal replacement bars or shakes may be better for you. Or if you like to prepare meals, finding a plan that includes daily menus and recipes may be best.  Ask your doctor about other health professionals who can help you achieve your weight loss goals.  A dietitian can help you make healthy changes in your diet.  An exercise specialist or personal trainer can help you develop a safe and effective exercise program.  A counselor or psychiatrist can help you cope with issues such as depression, anxiety, or family problems that can make it hard to focus on weight loss.  Consider joining a support group for people who are trying to lose weight. Your doctor can suggest groups in your area.  Where can you learn more?  Go to https://www.bennett.info/ and enter U357 to learn more about "Starting a Weight Loss Plan: Care Instructions."  Current as of: September 20, 2023Content Version: 14.0   2006-2024 Healthwise, Incorporated.   Care instructions adapted under license by [facility]. If you have questions about a medical condition or this instruction, always ask your healthcare professional. [place] any warranty or liability for your use of this information.           A Healthy Heart: Care Instructions  Overview     Coronary artery disease, also called heart disease, occurs when a substance called plaque builds up in the vessels that supply oxygen-rich blood to your heart muscle. This can narrow the blood vessels and reduce blood flow. A heart attack happens  when blood flow is completely blocked. A high-fat diet, smoking, and other factors increase the risk of heart disease.  Your doctor has found that you have a chance of having heart disease. A heart-healthy lifestyle can help keep your heart healthy and prevent heart disease. This lifestyle includes eating healthy, being active, staying at a weight that's healthy for you, and not smoking or using tobacco. It also includes taking medicines as directed, managing other health conditions, and trying to get a healthy amount of sleep.  Follow-up care is a key part of your treatment and safety. Be sure to make and go to all appointments, and call your doctor if you are having problems. It's also a good idea to know your test results and keep a list of the medicines you take.  How can you care for yourself at home?  Diet   Use less salt when you cook and eat. This helps lower your blood pressure. Taste food before salting. Add only a little salt when you think you need it. With time, your taste buds will adjust to less salt.    Eat fewer snack items, fast foods, canned soups, and other high-salt, high-fat, processed foods.    Read food labels and try to avoid saturated and trans fats. They increase your risk of heart disease by raising cholesterol levels.    Limit the amount of solid fat--butter, margarine, and shortening--you eat. Use olive, peanut, or canola oil when you cook. Bake, broil, and steam foods instead of frying them.    Eat a variety of fruit and vegetables every day. Dark green, deep orange, red, or yellow fruits and vegetables are especially good for you. Examples include spinach, carrots, peaches, and berries.    Foods high in fiber can reduce your cholesterol and provide important vitamins and minerals. High-fiber foods include whole-grain cereals and breads, oatmeal, beans, brown rice, citrus  fruits, and apples.    Eat lean proteins. Heart-healthy proteins include seafood, lean meats and poultry,  eggs, beans, peas, nuts, seeds, and soy products.    Limit drinks and foods with added sugar. These include candy, desserts, and soda pop.   Heart-healthy lifestyle   If your doctor recommends it, get more exercise. For many people, walking is a good choice. Or you may want to swim, bike, or do other activities. Bit by bit, increase the time you're active every day. Try for at least 30 minutes on most days of the week.    Try to quit or cut back on using tobacco and other nicotine products. This includes smoking and vaping. If you need help quitting, talk to your doctor about stop-smoking programs and medicines. These can increase your chances of quitting for good. Quitting is one of the most important things you can do to protect your heart. It is never too late to quit. Try to avoid secondhand smoke too.    Stay at a weight that's healthy for you. Talk to your doctor if you need help losing weight.    Try to get 7 to 9 hours of sleep each night.    Limit alcohol to 2 drinks a day for men and 1 drink a day for women. Too much alcohol can cause health problems.    Manage other health problems such as diabetes, high blood pressure, and high cholesterol. If you think you may have a problem with alcohol or drug use, talk to your doctor.   Medicines   Take your medicines exactly as prescribed. Call your doctor if you think you are having a problem with your medicine.    If your doctor recommends aspirin, take the amount directed each day. Make sure you take aspirin and not another kind of pain reliever, such as acetaminophen (Tylenol).   When should you call for help?   Call 911 if you have symptoms of a heart attack. These may include:   Chest pain or pressure, or a strange feeling in the chest.    Sweating.    Shortness of breath.    Pain, pressure, or a strange feeling in the back, neck, jaw, or upper belly or in one or both shoulders or arms.    Lightheadedness or sudden weakness.    A fast or  irregular heartbeat.   After you call 911, the operator may tell you to chew 1 adult-strength or 2 to 4 low-dose aspirin. Wait for an ambulance. Do not try to drive yourself.  Watch closely for changes in your health, and be sure to contact your doctor if you have any problems.  Where can you learn more?  Go to [employer] and enter F075 to learn more about "A Healthy Heart: Care Instructions."  Current as of: June 24, 2023Content Version: 14.0   2006-2024 Healthwise, Incorporated.   Care instructions adapted under license by [facility]. If you have questions about a medical condition or this instruction, always ask your healthcare professional. Healthwise, Incorporated disclaims any warranty or liability for your use of this information.      Personalized Preventive Plan for Chad Rosario - 07/22/2022  Medicare offers a range of preventive health benefits. Some of the tests and screenings are paid in full while other may be subject to a deductible, co-insurance, and/or copay.    Some of these benefits include a comprehensive review of your medical history including lifestyle, illnesses that may run in your family, and  various assessments and screenings as appropriate.    After reviewing your medical record and screening and assessments performed today your provider may have ordered immunizations, labs, imaging, and/or referrals for you.  A list of these orders (if applicable) as well as your Preventive Care list are included within your After Visit Summary for your review.    Other Preventive Recommendations:    A preventive eye exam performed by an eye specialist is recommended every 1-2 years to screen for glaucoma; cataracts, macular degeneration, and other eye disorders.  A preventive dental visit is recommended every 6 months.  Try to get at least 150 minutes of exercise per week or 10,000 steps per day on a pedometer .  Order or download the FREE "Exercise & Physical  Activity: Your Everyday Guide" from [employer]. Call 562-031-8000 or search [employer] online.  You need 1200-1500 mg of calcium and 1000-2000 IU of vitamin D per day. It is possible to meet your calcium requirement with diet alone, but a vitamin D supplement is usually necessary to meet this goal.  When exposed to the sun, use a sunscreen that protects against both UVA and UVB radiation with an SPF of 30 or greater. Reapply every 2 to 3 hours or after sweating, drying off with a towel, or swimming.  Always wear a seat belt when traveling in a car. Always wear a helmet when riding a bicycle or motorcycle.

## 2022-07-23 ENCOUNTER — Encounter: Payer: MEDICARE | Attending: Family | Primary: Family Medicine

## 2022-08-05 ENCOUNTER — Encounter: Payer: MEDICARE | Primary: Family Medicine

## 2022-08-07 ENCOUNTER — Encounter: Admit: 2022-08-07 | Discharge: 2022-08-07 | Payer: MEDICARE | Primary: Family Medicine

## 2022-08-07 DIAGNOSIS — I10 Essential (primary) hypertension: Secondary | ICD-10-CM

## 2022-08-07 LAB — LIPID PANEL
Chol/HDL Ratio: 4 (ref 0.0–5.0)
Cholesterol, Total: 182 MG/DL (ref 0–200)
HDL: 45 MG/DL (ref 40–60)
LDL Cholesterol: 114 MG/DL — ABNORMAL HIGH (ref 0–100)
Triglycerides: 113 MG/DL (ref 0–150)
VLDL Cholesterol Calculated: 23 MG/DL (ref 6–23)

## 2022-08-07 LAB — COMPREHENSIVE METABOLIC PANEL
ALT: 54 U/L (ref 12–65)
AST: 54 U/L — ABNORMAL HIGH (ref 15–37)
Albumin/Globulin Ratio: 1.3 (ref 1.0–1.9)
Albumin: 3.9 g/dL (ref 3.2–4.6)
Alk Phosphatase: 56 U/L (ref 40–129)
Anion Gap: 8 mmol/L — ABNORMAL LOW (ref 9–18)
BUN: 17 MG/DL (ref 8–23)
CO2: 28 mmol/L (ref 20–28)
Calcium: 9.3 MG/DL (ref 8.8–10.2)
Chloride: 102 mmol/L (ref 98–107)
Creatinine: 1.14 MG/DL (ref 0.80–1.30)
Est, Glom Filt Rate: 70 mL/min/{1.73_m2} (ref >60)
Globulin: 3 g/dL (ref 2.3–3.5)
Glucose: 122 mg/dL — ABNORMAL HIGH (ref 70–99)
Potassium: 5 mmol/L (ref 3.5–5.1)
Sodium: 138 mmol/L (ref 136–145)
Total Bilirubin: 1.2 MG/DL (ref 0.0–1.2)
Total Protein: 6.8 g/dL (ref 6.3–8.2)

## 2022-08-07 LAB — HEMOGLOBIN A1C
Estimated Avg Glucose: 109 mg/dL
Hemoglobin A1C: 5.4 % (ref 0–5.6)

## 2022-08-07 NOTE — Telephone Encounter (Signed)
He had a nurse visit for a BP check today    He saw Chad Cage PA on 07/22/22 and she increased his lisinopril to  30 mg once a day      His BP at 9:30 was 158/80 and his heart rate was 60    15 minutes later it was    150/78 and his heart rate was 62    Please advise

## 2022-08-07 NOTE — Telephone Encounter (Signed)
Bp has not changed much----HAVE HIM TO MAKE APPT WITHIN THE MONTH TO CONSIDER FURTHER ADJUSTMENT.

## 2022-08-07 NOTE — Addendum Note (Signed)
Addended byLeonette Monarch, Jamel Dunton on: 08/07/2022 10:06 AM     Modules accepted: Level of Service

## 2022-08-12 ENCOUNTER — Encounter

## 2022-08-12 NOTE — Telephone Encounter (Signed)
From: [name]  To: Dr. [name]  Sent: 08/12/2022 2:17 PM EDT  Subject: Physical Therapy    Hello - I would like to get some PT for my neuropathy. I need some help with balance and mobility. Could you please send a referral to:     [facility]  [address] [place], [place] [address]   [phone]    Thank you!

## 2022-08-30 NOTE — Progress Notes (Signed)
Formatting of this note might be different from the original.  DATE OF PROCEDURE: 03/24/2020  PROCEDURE PERFORMED:   1.  Open reduction internal fixation S1  2.  T10 to ilium instrumentation  3.  Stereotactic navigation  4.  Pelvic fixation  Drains:    1.  Deep JP coming out to the patient's left  2.  Superficial JP coming out to the patient's right    DATE OF PROCEDURE: 03/07/2020  STAGE I PROCEDURE PERFORMED (ANTERIOR):   1. Anterior lumbar diskectomy and interbody fusion via a retroperitoneal approach at L5-S1  2. Interbody cage at L5-S1    STAGE II PROCEDURE PERFORMED(POSTERIOR):  1. Posterior fusion at T10-S1  2. Posterior instrumentation from T10-S1  3.  Pelvic fixation  4. Stereotactic computer assisted navigation  5.  Morselized allograft  6.  Morselized autograft  7.  Posterior column osteotomies T10-T11, T11-T12, T12-L1, L1-L2, L2-L3  8.  Elevation of soft tissue flaps 30 cm x 4 cm x 2 flaps (240 cm)  9.  Removal of paddle leads from neurostimulator    DATE OF PROCEDURE: 03/06/2020  PROCEDURE PERFORMED:   1.  Removal of hardware with exploration of fusion L2-L5  2.  Removal of spinal cord stimulator pulse generator  3.  Posterior column osteotomies L5-S1  4.  Application of wound VAC 6 cm x 42 cm    SURGEON:  Belva Bertin, MD    Interval History    Very pleasant 67 year old male who comes in today for feeling really bad. He states he feels like he has had the flu for the last 3 weeks. He states he has had numerous surgeries & has never felt this bad. He feels like he is getting worse & feels that something is wrong. He is constantly cold with chills. Denies having a fever. He has difficulty sleeping. He saw PCP yesterday for insomnia. He has a lot of intermittent nausea. No vomiting. He has occasional headaches, but he feels this could be weather related. Denies the headaches being positional. Denies any drainage from incision. He states that he still has the "fluid build up in spine" & is wondering if  this could be making him feel this way. Denies any change in size. He is scheduled for appt next week, but he states he is miserable & concerned something could be wrong. Denies any covid exposure stating he has barely left the house.  He also notes that he recently had his gabapentin changed to Lyrica and has been treated for AOM with amoxicillin and then Bactrim. Ear feels better now.     Upon further review in the office today, he has lost 30 pounds since his surgery. His BP is low today. He is currently taking Lisinopril 10mg  daily. He further states that in the morning when he first wakes, he feels okay, then eats breakfast and takes his medicine and then starts to feel bad.     07/19/20  Patient reports he is doing better compared to his previous visit.  He continues to have burning pain in the bilateral feet.  This is getting better with time.  He also has a moderate sized fluid pocket underneath the lumbar portion of his incision.    His back pain has resolved.  This was quite debilitating for him before surgery and is now doing very well    Patient is planning to move to [place], [place] soon to be closer to his family.     04/12/2022: [name].  Not having any significant pain but his orthopedic surgeon in [place] noticed he had a rod fracture.    08/30/2022: Overall is doing very well.  He has not had any significant pain since I saw him last.  [name] but overall has a reasonably good quality of life    Exam    Telephone visit    Imaging    CT and x-rays reviewed.  No change in alignment.    CT Thoracic & Lumbar  1. Postoperative change from posterior lateral fusion at T10 through the sacrum with pedicle screws and rods. The hardware is intact.  2. There is solid posterior arthrodesis at T12-L4 on the left and T12-L3 on the right.  3.There is solid anterior arthrodesis at L2-3, L3-4, L4-5 and L5-S1  4. There is lucency around the pedicle screws  bilaterally at T10 and on the left at T11 consistent with hardware loosening.  5. No significant abnormality in the thoracic spine at T1-T9    Possible right lumbosacral rod fracture    Assessment & Plan  67 year old male overall doing well following surgery.  But has loosening of the T10 and T11 screws with fractures of the right T12 and L1 screws.  He would like to avoid surgery if at all possible and given his asymptomatic status I think that that is very reasonable.      Plan to see him back in my [place] run clinic towards the end of the year.  He will let me know if he needs anything sooner    Patient Counseling  I have spent 8 minutes in evaluation, management, and counseling today. Greater than 50% of the time did relate to counseling regarding their symptoms, diagnosis, management options, and logistics. Risks, benefits, and alternatives of the medications and treatment plan prescribed today were discussed, and patient expressed understanding. Plan follow-up as discussed or as needed if any worsening symptoms or change in condition.     Electronically signed by Julian Hy, MD at 08/30/2022  9:39 AM EDT

## 2022-09-09 ENCOUNTER — Ambulatory Visit: Admit: 2022-09-09 | Discharge: 2022-09-09 | Payer: MEDICARE | Attending: Family Medicine | Primary: Family Medicine

## 2022-09-09 VITALS — BP 130/80 | HR 59 | Temp 98.00000°F | Ht 72.0 in | Wt 288.2 lb

## 2022-09-09 DIAGNOSIS — Z125 Encounter for screening for malignant neoplasm of prostate: Principal | ICD-10-CM

## 2022-09-09 LAB — PSA SCREENING: PSA: 1.4 ng/mL (ref 0.0–4.0)

## 2022-09-10 NOTE — Progress Notes (Signed)
Chad Rosario  03-Mar-1956 is a 67 y.o. male ,Established patient, here for evaluation of the following chief complaint(s):   Chief Complaint   Patient presents with    Follow-up     Been feeling okay-           1. Screening for prostate cancer  -     PSA Screening; Future    HYPERTENSION----CHRONIC STABLE    Return in about 6 months (around 03/11/2023).        Subjective   SUBJECTIVE/OBJECTIVE:  Orthopedics----complex tear medial mediscus    OSTEOARTHRTIS---right shoulder.    Hypertension  This is a chronic problem. The current episode started more than 1 year ago. The problem has been gradually improving since onset. The problem is controlled. Pertinent negatives include no anxiety, blurred vision, chest pain, headaches, palpitations, peripheral edema, PND, shortness of breath or sweats.       Review of Systems   Constitutional:  Negative for chills and fever.   HENT:  Negative for ear pain, hearing loss and sore throat.    Eyes:  Negative for blurred vision, photophobia and pain.   Respiratory:  Negative for cough and shortness of breath.    Cardiovascular:  Negative for chest pain, palpitations, leg swelling and PND.   Gastrointestinal:  Negative for abdominal distention, abdominal pain, blood in stool and nausea.   Genitourinary:  Negative for dysuria and urgency.   Musculoskeletal:  Negative for joint swelling and myalgias.   Skin:  Negative for color change, pallor and rash.   Neurological:  Negative for speech difficulty, weakness, light-headedness and headaches.   Hematological:  Negative for adenopathy.   Psychiatric/Behavioral:  Negative for agitation, confusion, hallucinations, self-injury and suicidal ideas.           Objective   Physical Exam  Constitutional:       Appearance: Normal appearance.   HENT:      Head: Normocephalic and atraumatic.      Nose: Nose normal.   Eyes:      Extraocular Movements: Extraocular movements intact.      Conjunctiva/sclera: Conjunctivae normal.      Pupils: Pupils are  equal, round, and reactive to light.   Cardiovascular:      Rate and Rhythm: Normal rate and regular rhythm.      Pulses: Normal pulses.      Heart sounds: Normal heart sounds.   Pulmonary:      Effort: Pulmonary effort is normal.      Breath sounds: Normal breath sounds.   Abdominal:      General: Abdomen is flat. Bowel sounds are normal.      Palpations: Abdomen is soft.   Skin:     General: Skin is warm and dry.   Neurological:      General: No focal deficit present.      Mental Status: He is alert and oriented to person, place, and time.   Psychiatric:         Mood and Affect: Mood normal.                   An electronic signature was used to authenticate this note.    --[name], MD

## 2022-09-23 ENCOUNTER — Telehealth: Admit: 2022-09-23 | Discharge: 2022-09-23 | Payer: MEDICARE | Attending: Family | Primary: Family Medicine

## 2022-09-23 DIAGNOSIS — G4733 Obstructive sleep apnea (adult) (pediatric): Secondary | ICD-10-CM

## 2022-09-23 NOTE — Patient Instructions (Signed)
My Options > Climate Control. Change this to manual from auto.  You will then see humidity and tube temperature under the menu.    Humidity ranges from 0-8. This will default to 4.  The lower the number, the more dry the air. The higher the number, the more moisture you'll receive in the air. Leave this on 4 for now and adjust later if needed.  Tube temperature ranges from 60-86 degrees F. This will default to 81 degrees.  The higher the number, the more dry the air.      How to Adjust Humidity Level  My Options- press the dial  Scroll the dial down to Humidity and press dial  Turn the dial to adjust the humidity level and press dial  Scroll the dial up to Home to get to main menu         How to Adjust Tube Temperature  My Options- press the dial  Scroll the dial down to Climate Control and press dial  Ensure Climate Control is set to Manual and then press dial  Turn the dial to Tube Temp and press dial  Adjust temperature to your preference  Scroll the dial up to Home to get to main menu

## 2022-09-23 NOTE — Progress Notes (Signed)
[facility]  [address]  [place], [place] [address]  [phone]    Patient Name:  Chad Rosario  Date of Birth:  [birth year]    Chad Rosario, was evaluated through a synchronous (real-time) audio-video encounter. The patient (or guardian if applicable) is aware that this is a billable service, which includes applicable co-pays. This Virtual Visit was conducted with patient's (and/or legal guardian's) consent. Patient identification was verified, and a caregiver was present when appropriate.   The patient was located at Home: [address] [facility] [address]  Provider was located at Home (Appt Dept State): [place]  Confirm you are appropriately licensed, registered, or certified to deliver care in the state where the patient is located as indicated above. If you are not or unsure, please re-schedule the visit: Yes, I confirm.          --Richardson Landry, APRN - CNP on 09/23/2022 at 1:32 PM             Office Visit 09/23/2022    CHIEF COMPLAINT:    No chief complaint on file.      HISTORY OF PRESENT ILLNESS:  Patient is being seen today via virtual visit.  Patient was initially diagnosed with sleep apnea in 2005. We were unable to obtain a copy of this study.    Home sleep test 10/09/21 with AHI 33.1/hr with desaturations to 82%. He is prescribed CPAP 11 cm using a nasal mask. He has 2 machines. Download on this machine is 85% compliance over the past year with average nightly use 6 hrs 39 min. AHI is 1.5/hr. He denies any issues with the pressure. He is using a nasal mask and states that it is comfortable.  He wakes up rested.  He has some daytime fatigue but states that he is on high-dose gabapentin.  He has an upcoming appointment with neurology and is hoping to have this medication adjusted to help with neuropathy.  He continues to take Ambien as needed prescribed by primary care physician.     He is having some nasal congestion.  He does not have climate control tubing but rather standard tubing.   Will order this and provide instructions on how to adjust climate control.                09/23/2022    10:53 AM 07/20/2021     8:49 AM   Sleep Medicine   Sitting and reading 0 3   Watching TV 1 0   Sitting, inactive in a public place (e.g. a theatre or a meeting) 0 0   As a passenger in a car for an hour without a break 0 0   Lying down to rest in the afternoon when circumstances permit 0 3   Sitting and talking to someone 0 0   Sitting quietly after a lunch without alcohol 0 3   In a car, while stopped for a few minutes in traffic 0 0   Epworth Sleepiness Score 1 9        Past Medical History:   Diagnosis Date    CAD (coronary artery disease) 2015    Gout     Hypertension     controlled with meds    Neuropathy May 2019    Osteoarthritis 03/2013    Sleep apnea     cpap at night       Patient Active Problem List   Diagnosis    Obesity (BMI  30-39.9)    History of DVT (deep vein thrombosis)    Hx of CABG    Hypertension    Hyperlipidemia    Chronic dyspnea    Thrombocytopenia (HCC)    Neuropathic pain    Paresthesia    Weakness of lower extremity    Foot drop, bilateral    Cramps of lower extremity    Acute medial meniscal tear    Allergic rhinitis    Ataxia    Calcium oxalate renal stones    Chronic pain disorder    Coronary artery disease involving native coronary artery of native heart without angina pectoris    Deep vein thrombosis (DVT) of femoral vein of right lower extremity (HCC)    Degeneration of intervertebral disc of lumbar region    Colon adenoma    Dyslipidemia    GERD without esophagitis    Gout of both feet    Idiopathic chronic gout of multiple sites without tophus    H/O seasonal allergies    Idiopathic gout    Idiopathic peripheral neuropathy    IFG (impaired fasting glucose)    Insomnia    Low back pain    Lumbar facet joint pain    Lumbar postlaminectomy syndrome    Lumbar radiculopathy, chronic    Lumbar stenosis    Obstructive sleep apnea syndrome    Primary osteoarthritis of left hip    S/P  angioplasty with stent           Past Surgical History:   Procedure Laterality Date    BACK SURGERY  2021    has had 3 different ones last was in 2021    CARDIAC SURGERY      CABG    CHOLECYSTECTOMY      COLONOSCOPY N/A 11/29/2020    COLONOSCOPY POLYPECTOMY HOT SNARE performed by Drucie Opitz, MD at [facility] ENDOSCOPY    EYE SURGERY Left     cataract    FRACTURE SURGERY Left     tib-fib fracture    JOINT REPLACEMENT  Hip - 2018    KNEE ARTHROSCOPY Right     TOTAL HIP ARTHROPLASTY Left            Social History     Socioeconomic History    Marital status: Married     Spouse name: Not on file    Number of children: Not on file    Years of education: Not on file    Highest education level: Not on file   Occupational History    Not on file   Tobacco Use    Smoking status: Never    Smokeless tobacco: Never   Vaping Use    Vaping Use: Never used   Substance and Sexual Activity    Alcohol use: Yes     Alcohol/week: 12.0 standard drinks of alcohol     Types: 6 Cans of beer, 6 Shots of liquor per week     Comment: socially    Drug use: Never    Sexual activity: Not Currently     Partners: Female   Other Topics Concern    Not on file   Social History Narrative    Not on file     Social Determinants of Health     Financial Resource Strain: Low Risk  (04/26/2021)    Overall Financial Resource Strain (CARDIA)     Difficulty of Paying Living Expenses: Not hard at all   Food Insecurity: Not on file (05/03/2022)  Transportation Needs: Unknown (04/26/2021)    PRAPARE - Therapist, art (Medical): Not on file     Lack of Transportation (Non-Medical): No   Physical Activity: Insufficiently Active (07/19/2022)    Exercise Vital Sign     Days of Exercise per Week: 2 days     Minutes of Exercise per Session: 30 min   Stress: Not on file   Social Connections: Not on file   Intimate Partner Violence: Not on file   Housing Stability: Unknown (04/26/2021)    Housing Stability Vital Sign     Unable to Pay for Housing in the Last  Year: Not on file     Number of Places Lived in the Last Year: Not on file     Unstable Housing in the Last Year: No         Family History   Problem Relation Age of Onset    Cancer Mother         Lymp    Stroke Father     Cancer Sister         Breast    Clotting Disorder Maternal Grandfather          Allergies   Allergen Reactions    Hydrocodone-Acetaminophen Anxiety and Itching    Losartan Shortness Of Breath    Amlodipine Other (See Comments)     Other reaction(s): Other (See Comments)  Fatigue   Fatigue       Rosuvastatin Other (See Comments)     Other reaction(s): Constipation  Fatigue, constipation  Other reaction(s): Fatigue    Atorvastatin Diarrhea    Ezetimibe      Other reaction(s): Constipation, Other (See Comments)  Constipation and decreased urine output  Constipation and decreased urine output  Other reaction(s): Constipation, Urine output low           Current Outpatient Medications   Medication Sig    lisinopril (PRINIVIL;ZESTRIL) 30 MG tablet Take 1 tablet by mouth daily    zolpidem (AMBIEN) 5 MG tablet Take 1 tablet by mouth nightly as needed for Sleep for up to 180 days. Max Daily Amount: 5 mg    famotidine (PEPCID) 40 MG tablet Take 1 tablet by mouth    rivaroxaban (XARELTO) 20 MG TABS tablet Take 1 tablet by mouth daily (with breakfast)    gabapentin (NEURONTIN) 600 MG tablet Take 1 tablet by mouth in the morning, at noon, in the evening, and at bedtime.    allopurinol (ZYLOPRIM) 300 MG tablet Take 1 tablet by mouth daily    omeprazole (PRILOSEC) 20 MG delayed release capsule Take 1 capsule by mouth every morning (before breakfast)    Multiple Vitamins-Minerals (THERAPEUTIC MULTIVITAMIN-MINERALS) tablet Take 1 tablet by mouth daily    vitamin D3 (CHOLECALCIFEROL) 10 MCG (400 UNIT) TABS tablet Take 2 tablets by mouth daily    CPAP Machine MISC by Does not apply route    colchicine (MITIGARE) 0.6 MG capsule Take 1 capsule by mouth as needed for Pain    Acetaminophen 500 MG CAPS Take 2 capsules by  mouth as needed    Alpha-Lipoic Acid 200 MG CAPS Take 300 mg by mouth    azelastine (ASTELIN) 0.1 % nasal spray 1 spray by Nasal route as needed    fexofenadine (ALLEGRA) 180 MG tablet Take 1 tablet by mouth daily     No current facility-administered medications for this visit.             REVIEW  OF SYSTEMS:   CONSTITUTIONAL:   There is no history of fever, chills, night sweats.       CARDIAC:   No chest pain, pressure, discomfort, palpitations, orthopnea, murmurs, or edema.   GI:   No dysphagia, heartburn reflux, nausea/vomiting, diarrhea, abdominal pain, or bleeding.   NEURO:   There is no history of AMS, persistent headache, decreased level of consciousness, seizures, or motor or sensory deficits.    VIRTUAL EXAM  PHYSICAL EXAMINATION:  [ INSTRUCTIONS:  "[x] " Indicates a positive item  "[] " Indicates a negative item   Vital Signs: (As obtained by patient/caregiver at home)  There were no vitals taken for this visit.     Constitutional: [x]  Appears well-developed and well-nourished [x]  No apparent distress      []  Abnormal     Mental status: [x]  Alert and awake  [x]  Oriented to person/place/time [x]  Able to follow commands    []  Abnormal -     Eyes:   EOM    [x]   Normal    []  Abnormal -   Sclera  [x]   Normal    []  Abnormal -          Discharge [x]   None visible   []  Abnormal -    HENT: [x]  Normocephalic, atraumatic  []  Abnormal -  [x]  Mouth/Throat: Mucous membranes are moist    External Ears [x]  Normal  []  Abnormal -    Neck: [x]  No visualized mass []  Abnormal -     Pulmonary/Chest: [x]  Respiratory effort normal   [x]  No visualized signs of difficulty breathing or respiratory distress        []  Abnormal -      Musculoskeletal:   [x]  Normal gait with no signs of ataxia         [x]  Normal range of motion of neck        []  Abnormal -     Neurological:        [x]  No Facial Asymmetry (Cranial nerve 7 motor function) (limited exam due to video visit)          [x]  No gaze palsy        []  Abnormal -         Skin:         [x]  No significant exanthematous lesions or discoloration noted on facial skin         []  Abnormal -            Psychiatric:       [x]  Normal Affect []  Abnormal -       [x]  No Hallucinations    Other pertinent observable physical exam findings:-      ASSESSMENT:  (Medical Decision Making)      Diagnosis Orders   1. OSA (obstructive sleep apnea)  DME - DURABLE MEDICAL EQUIPMENT   He is using and benefiting from PAP therapy.  Continue current settings with nightly compliance   2. Nocturnal hypoxemia  DME - DURABLE MEDICAL EQUIPMENT   Improved on PAP therapy        PLAN:  Continue CPAP 11 cm.  Renew supplies.  Follow-up will be in 1 year or sooner if needed    Orders Placed This Encounter   Procedures    DME - DURABLE MEDICAL EQUIPMENT     [facility]  Needs climate control tubing.    [facility]  Phone: [address]  [place] [address]  Dept: [phone]  Patient Name: Chad Rosario  DOB: [birth year]  Gender: male  Address: [address]  [place] [address]   Patient phone: [phone] (home)       Primary Insurance: Payor: MEDICARE / Plan: MEDICARE PART A AND B / Product Type: *No Product type* /   Subscriber ID: [id number] - (Medicare)      AMB Supply Order  Order Details     DME Location:   Order Date: 09/23/2022   There were no encounter diagnoses.             (  X   )Supplies Needed       cpap Machine   (     )[phone] CPAP Unit  (     )[phone] Auto CPAP Unit  (     )E0470 BiLevel Unit  (     )E0470 Auto BiLevel Unit  (     )E0471 ASV        (     )E0471 Bilevel ST      Length of need: 12 months    Pressure:  11 cmH20  EPR:     Starting Ramp Pressure:   cm H20  Ramp Time: min        Patient had a diagnostic Apnea Hypopnea Index (AHI) of :    *SUPPLIES* Replace all as needed, or per coverage guidelines     Masks Type:  (    ) A7030-Full Face Mask (1 per 3 mon)  (    ) A7031-Full Mask (1 per month) Interface/Cushion      ( x ) A7034-Nasal Mask (1 per 3 mon)  ( x  ) O8416- Nasal  Mask (1 per month) Interface/Cushion  (  x   ) A7033-Pillow (2 per mon)  (     ) A7036-Chinstrap (1 per 6 mon)            Other Supplies:    (   X  )A7035-Headgear (1 per 6 mon)  (   X  )A4604-Heated Tubing (1 per 3 mon)  (   X  )S0630- Disposable Filter (2 per mon)  (   x  )E0562-Heated Humidifier (1 per year)     (  x   )A7036-Chinstrap (sometimes used with Full Face Mask) (1 per 6 mos)  (    )A7037-Tubing-without heat (1 per 3 mos)  (     )A7039-Non-Disposable Filter (1 per 6 mos)  (  x   )A7046-Water Chamber (1 per 6 mos)  (     )E0561-Humidifier non-heated (1 per 5 yrs)      Signed Date: 09/23/2022  Electronically Signed By: Richardson Landry, APRN - CNP  Electronically Dated:  09/23/2022     No orders of the defined types were placed in this encounter.         Collaborating Physician: Dr. Nelle Don      I spent at least 22 minutes with this established patient, and >50% of the time was spent counseling and/or coordinating care regarding osa.        Richardson Landry, APRN - CNP  Electronically signed

## 2022-10-16 ENCOUNTER — Encounter: Payer: MEDICARE | Attending: Neurology | Primary: Family Medicine

## 2022-10-16 ENCOUNTER — Ambulatory Visit: Admit: 2022-10-16 | Discharge: 2022-10-16 | Payer: MEDICARE | Attending: Neurology | Primary: Family Medicine

## 2022-10-16 ENCOUNTER — Encounter

## 2022-10-16 DIAGNOSIS — G609 Hereditary and idiopathic neuropathy, unspecified: Secondary | ICD-10-CM

## 2022-10-16 LAB — VITAMIN B12: Vitamin B-12: 2114 pg/mL — ABNORMAL HIGH (ref 193–986)

## 2022-10-16 LAB — FOLATE: Folate: 27.2 ng/mL — ABNORMAL HIGH (ref 3.1–17.5)

## 2022-10-16 NOTE — Progress Notes (Signed)
NEUROLOGY FOLLOW-UP NOTE    Patient: Chad Rosario  Physician: Chesley Mires, MD    CC:   Chief Complaint   Patient presents with    Other     Prev Pt of Dr. [name]. He is here for f/u of neuropathy. He states there isnt much pain but he feels the numbness and balance are worsening.        PCP: [name], MD  Referring Provider: No ref. provider found    History of Present Illness:     Interval History on 10/16/2022:  Chad Rosario is a 67 y.o. 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  He is overall doing well.  He reports subjectively fluctuating numbness and imbalance.  Reports side effects with current dose of Gabapentin 600mg  QID, she is very fatigued. Works with PT. No falls, uses cane for security to prevent falling down.   Previous workup was unrevealing in terms of etiology of his polyneuropathy.  B12 was borderline at 295 in 2019, we discussed repeating it today.   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  This findings correlate with patient's clinical picture.  Discussed with patient patient can take to prevent further damage.  Discussed prognosis for his neuropathy.      Original HPI 05/21/21  67 year old married right-handed white male with many problems.  He is sent for gait disorder weakness of his legs and feet bilateral foot drop polyneuropathy.  He was seen over the years and finally was seen by neuropathy evaluator Dr. Marcello Moores at [facility].     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     He notes that his dad actually had weakness onset his last 2 years of life in his 28s mid 12s to late 69s of severe weakness at his feet to the point of needing a wheelchair.  His parents are succumbed by this time and there is no other family history of such.  Apparently his dad had pes planus.     Patient states that he has  more of a pes cavus.  Idiopathic polyneuropathy = = with stumbling and falling he does have a placard for his car he does have a cane that he did not bring with him as he comes unaccompanied today.  He notes that he always has to have the lights on at night has a moderate degree to a severe degree of gait disturbance and falling but no incontinence.                      right handed 67 y.o.    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       Review of Systems:   A comprehensive 10-point ROS was obtained and was negative other than noted in the HPI.  Review of Systems     Lab/Imaging Review:   I REVIEWED PERTINENT LABS, IMAGES, AND REPORTS WITH THE PATIENT PERSONALLY, DIRECTLY AND FULLY. THE MOST PERTINENT FINDINGS ARE NOTED BELOW:    MRI Result (most recent):  MRI KNEE LEFT WO CONTRAST 01/18/2022    Narrative  ---------------------------------------------    EXAMINATION: MRI KNEE LEFT WITHOUT CONTRAST 01/18/2022 7:45 AM EDT    ACCESSION NUMBER: [id number]    COMPARISON: Radiographs performed  January 16, 2022.    INDICATION: Left knee pain x3 weeks    TECHNIQUE: Dedicated MRI of the left knee was performed with multiplanar multiecho technique  Technical note: Evaluation is degraded by patient motion.    FINDINGS:    MEDIAL MENISCUS: High-grade radial type tear of the posterior root attachment with mild extrusion of the body segment.  LATERAL MENISCUS:  Intact.    ACL:  Intact.  PCL:  Intact.  MCL:  Intact.  LATERAL LIGAMENTS AND TENDONS:  Intact.    EXTENSOR MECHANISM:  The quadriceps and patellar tendons are intact.    FAT PADS:   Postsurgical scarring within Hoffa's fat.    CARTILAGE:  Patellofemoral compartment: Mild surface irregularity. Suggestion of partial-thickness chondral fissuring within  the inferior aspect of the patellar apex.  Medial compartment: Mild diffuse chondral thinning and surface irregularity. Focus of full-thickness chondral loss at the mid weightbearing surface of the medial femoral condyle with subtle subchondral marrow reactive edema.  Lateral compartment: Intact.    BONE MARROW: Partially imaged intramedullary rod within the tibia with proximal fixation screw. No fracture or marrow replacing process.    OTHER: Small joint effusion. Mild anterior predominant subcutaneous soft tissue edema. Suggestion of mild fatty infiltration of the semimembranosus muscle.    Impression  IMPRESSION:    1.  High-grade radial type tear of the posterior root attachment medial meniscus with mild extrusion of the body segment.  2.  Mild osteoarthritic changes, as above, with a focus of full-thickness chondral fissuring within the medial femoral condyle.  3.  Cruciate and collateral ligaments are intact.  4.  Small joint effusion.     CT Result (most recent):  CT LUMBAR SPINE WO CONTRAST 03/12/2022    Narrative  ---------------------------------------------      EXAM: CT thoracic spine without contrast. CT lumbar spine without contrast.  INDICATION: Thoracic and lumbar back surgery 2 years ago with right lower back pain and bilateral lower extremity numbness.  COMPARISON: None.    Multiple axial images were obtained through the thoracic and lumbar spine. Radiation dose reduction techniques were used for this study:  All CT scans performed at this facility use one or all of the following: Automated exposure control, adjustment of  the mA and/or kVp according to patient's size, iterative reconstruction.    FINDINGS:  CT thoracic and lumbar spine:  There is straightening of the normal lordosis in the lumbar spine. No evidence of spondylolisthesis. Vertebral body heights are preserved. No evidence of fracture or aggressive osseous abnormality. Evaluation of the spinal canal is limited by CT,  especially with  the streak artifact from the metallic hardware. There is a large dorsal subcutaneous fluid collection measuring approximately 1.5 cm in the axial plane and extending from the mid thoracic spine to the level of the sacral spine.  Prevertebral soft tissues are within normal limits. Atherosclerotic calcifications. Hepatic steatosis.    Postsurgical changes of T10 to biiliac posterior instrumented fusion. Significant periprosthetic lucency along the bilateral T10 pedicle screws and to a lesser extent the left T11 pedicle screw concerning for loosening. Hardware remains intact. There are  minimal degenerative disc changes throughout the upper and mid thoracic spine. There is persistent endplate osteophytic spurring and facet arthropathy in the lumbar spine resulting in mild neuroforaminal narrowing at several levels. Evaluation is  otherwise severely limited by CT, especially given the extensive streak artifact. There is likely some degree of persistent spinal canal narrowing at several levels in the lumbar spine.  Impression  IMPRESSION:  CT thoracic and lumbar spine:  1. Postsurgical changes of T10 to biiliac posterior instrumented fusion with lucency along the T10 and T11 pedicle screws concerning for loosening.  2. Multilevel degenerative disc disease and facet arthropathy is poorly assessed by CT, especially given the streak artifact from the metallic hardware. There are likely multilevel neuroforaminal and probable spinal canal stenoses in the lumbar spine.  3. Large dorsal subcutaneous fluid collection extending from the mid thoracic spine to the level of the sacral spine.     Cardiology (most recent):  No results found for this or any previous visit.       Past Medical History:   Past medical history, surgical history, social history, family history, medications, and allergies were reviewed and updated as appropriate.     PAST MEDICAL HISTORY:  Past Medical History:   Diagnosis Date    CAD (coronary artery  disease) 2015    Gout     Hypertension     controlled with meds    Neuropathy May 2019    Osteoarthritis 03/2013    Sleep apnea     cpap at night       PAST SURGICAL HISTORY:   Past Surgical History:   Procedure Laterality Date    BACK SURGERY  2021    has had 3 different ones last was in 2021    CARDIAC SURGERY      CABG    CHOLECYSTECTOMY      COLONOSCOPY N/A 11/29/2020    COLONOSCOPY POLYPECTOMY HOT SNARE performed by Drucie Opitz, MD at [facility] ENDOSCOPY    EYE SURGERY Left     cataract    FRACTURE SURGERY Left     tib-fib fracture    JOINT REPLACEMENT  Hip - 2018    KNEE ARTHROSCOPY Right     TOTAL HIP ARTHROPLASTY Left        FAMILY HISTORY:  Family History   Problem Relation Age of Onset    Cancer Mother         Lymp    Stroke Father     Cancer Sister         Breast    Clotting Disorder Maternal Grandfather         SOCIAL HISTORY:  Social History     Socioeconomic History    Marital status: Married     Spouse name: None    Number of children: None    Years of education: None    Highest education level: None   Tobacco Use    Smoking status: Never    Smokeless tobacco: Never   Vaping Use    Vaping Use: Never used   Substance and Sexual Activity    Alcohol use: Yes     Alcohol/week: 12.0 standard drinks of alcohol     Types: 6 Cans of beer, 6 Shots of liquor per week     Comment: socially    Drug use: Never    Sexual activity: Not Currently     Partners: Female     Social Determinants of Health     Financial Resource Strain: Low Risk  (04/26/2021)    Overall Financial Resource Strain (CARDIA)     Difficulty of Paying Living Expenses: Not hard at all   Transportation Needs: Unknown (04/26/2021)    PRAPARE - Transportation     Lack of Transportation (Non-Medical): No   Physical Activity: Insufficiently Active (07/19/2022)    Exercise Vital Sign  Days of Exercise per Week: 2 days     Minutes of Exercise per Session: 30 min   Housing Stability: Unknown (04/26/2021)    Housing Stability Vital Sign     Unstable Housing in the  Last Year: No         Medications/Allergies:   MEDICATIONS:   Outpatient Encounter Medications as of 10/16/2022   Medication Sig Dispense Refill    lisinopril (PRINIVIL;ZESTRIL) 30 MG tablet Take 1 tablet by mouth daily 30 tablet 1    zolpidem (AMBIEN) 5 MG tablet Take 1 tablet by mouth nightly as needed for Sleep for up to 180 days. Max Daily Amount: 5 mg 90 tablet 1    rivaroxaban (XARELTO) 20 MG TABS tablet Take 1 tablet by mouth daily (with breakfast) 90 tablet 1    gabapentin (NEURONTIN) 600 MG tablet Take 1 tablet by mouth in the morning, at noon, in the evening, and at bedtime. 360 tablet 3    allopurinol (ZYLOPRIM) 300 MG tablet Take 1 tablet by mouth daily 90 tablet 3    Multiple Vitamins-Minerals (THERAPEUTIC MULTIVITAMIN-MINERALS) tablet Take 1 tablet by mouth daily      vitamin D3 (CHOLECALCIFEROL) 10 MCG (400 UNIT) TABS tablet Take 2 tablets by mouth daily      CPAP Machine MISC by Does not apply route      colchicine (MITIGARE) 0.6 MG capsule Take 1 capsule by mouth as needed for Pain 30 capsule 3    Alpha-Lipoic Acid 200 MG CAPS Take 300 mg by mouth      fexofenadine (ALLEGRA) 180 MG tablet Take 1 tablet by mouth daily      [DISCONTINUED] famotidine (PEPCID) 40 MG tablet Take 1 tablet by mouth      [DISCONTINUED] omeprazole (PRILOSEC) 20 MG delayed release capsule Take 1 capsule by mouth every morning (before breakfast) 90 capsule 3    [DISCONTINUED] Acetaminophen 500 MG CAPS Take 2 capsules by mouth as needed      [DISCONTINUED] azelastine (ASTELIN) 0.1 % nasal spray 1 spray by Nasal route as needed       No facility-administered encounter medications on file as of 10/16/2022.       ALLERGIES:  Allergies   Allergen Reactions    Hydrocodone-Acetaminophen Anxiety and Itching    Losartan Shortness Of Breath    Amlodipine Other (See Comments)     Other reaction(s): Other (See Comments)  Fatigue   Fatigue       Rosuvastatin Other (See Comments)     Other reaction(s): Constipation  Fatigue,  constipation  Other reaction(s): Fatigue    Atorvastatin Diarrhea    Ezetimibe      Other reaction(s): Constipation, Other (See Comments)  Constipation and decreased urine output  Constipation and decreased urine output  Other reaction(s): Constipation, Urine output low         Physical Exam:   BP (!) 151/84   Pulse 63   Ht 1.829 m (6')   Wt 130.6 kg (288 lb)   SpO2 94%   BMI 39.06 kg/m     Physical and Neurological Exam  General: no acute distress, cooperative  HEENT: normocephalic, anicteric sclerae, MMM  Cardiovascular: RRR  Respiratory: good air entry b/l, unlabored breathing  Gastrointestinal: non-distended   Extremities: no peripheral edema   Skin: warm, dry, no rash    Mental Status: AOx4, normal affect, following commands consistently, language is normal (both comprehension and repetition)  Language: Comprehension intact, no notable hearing deficit, expression is fluent.  Speech:  Cranial Nerves: visual fields full, eye movements are intact  light touch in V1-3 distributions is intact and symmetric  facial muscles of expression symmetric  hearing grossly intact bilaterally  uvula is midline, symmetric palate elevation, SCM and trap strength symmetric  Motor: normal bulk and tone, strength in LUE 5/5, RUE 5/5, dorsiflexion weakness bilaterally 3 out of 5  Sensory: length-dependent decrease in light touch/pinprick and temperature sensation from bilateral toes to knees, vibration sense is reduced in both toes  Reflexes: Absent ankles and knees  Coordination: FTN and HTS without dysmetria, Romberg negative   Gait: Cautious, ambulates with    Assessment and Plan:   Chad Rosario is a 67 y.o. male who presents with the following concerns:     Hubbert was seen today for other.    Diagnoses and all orders for this visit:    Idiopathic peripheral neuropathy    Neuropathic pain    Foot drop, bilateral    Medication management    Health education/counseling    Other orders  -     Vitamin B12; Future  -      Methylmalonic Acid, Serum; Future  -     Vitamin B6; Future  -     Electrophoresis Protein, Serum; Future  -     Folate; Future      Patient presents for follow-up of idiopathic peripheral neuropathy.  Reviewed his previous workup.  Bit 12 was borderline in 2019.  B6, electrophoresis, folate were not checked.  I will go ahead and check it today.  Discussed prognosis of patient's peripheral neuropathy.    Discussed that it would be advisable for patient to go down on gabapentin since he is having significant side effects with that.  Advised patient to get back to 3 times a day dosing with plan to cut his morning dose in half if he is still drowsy.  He is open to switch back to Cymbalta which she tried before if gabapentin side effects persist.    Plan:  - Obtain vitamin B12, MMA, B6, electrophoresis, folate  - Decrease gabapentin to 600 mg 3 times a day, okay to cut morning dose in half if drowsiness persists  - Advised patient to continue to work with physical therapy  - Advised patient to inspect his feet for cuts and scratches daily, avoid walking barefoot, wear comfortable shoes        Return in about 1 year (around 10/16/2023).    Chesley Mires, MD   General Neurology   [facility]  [address], [address]  [place], [place] [address]  Phone: [phone]    All medications and relevant precautions were fully reviewed with the patient. More than 50% of this visit was used to evaluate, educate and coordinate care for the patient for the above concerns. Total visit time: 42 minutes. Time includes pre- and post- face-to-face time, including record review of available pertinent images and reports. I have written all aspects of this note, parts of which were produced using speech recognition software, which may contain inadvertent errors in the produced words.

## 2022-10-18 LAB — ELECTROPHORESIS PROTEIN, SERUM
Albumin/Globulin Ratio: 1.1 (ref 0.7–1.7)
Albumin: 3.9 g/dL (ref 2.9–4.4)
Alpha-1-Globulin: 0.2 g/dL (ref 0.0–0.4)
Alpha-2-Globulin: 0.7 g/dL (ref 0.4–1.0)
Beta Globulin: 1.2 g/dL (ref 0.7–1.3)
Gamma Globulin: 1.3 g/dL (ref 0.4–1.8)
Globulin: 3.4 g/dL (ref 2.2–3.9)
Total Protein: 7.3 g/dL (ref 6.0–8.5)

## 2022-10-20 LAB — VITAMIN B6: Vitamin B6, Plasma: 13.2 ug/L (ref 3.4–65.2)

## 2022-10-21 LAB — METHYLMALONIC ACID, SERUM: Methylmalonic Acid: 153 nmol/L (ref 0–378)

## 2022-10-22 NOTE — Telephone Encounter (Signed)
Pt called and asked to see BSK for a possible shoulder replacement. His current ortho does not do this procedure. His records are in the chart. Will he see him?

## 2022-10-23 NOTE — Telephone Encounter (Signed)
Lvm to schedule

## 2022-10-31 NOTE — Telephone Encounter (Signed)
I have pended the medication and pharmacy

## 2022-11-01 MED ORDER — DULOXETINE HCL 30 MG PO CPEP
30 MG | ORAL_CAPSULE | Freq: Every day | ORAL | 3 refills | Status: DC
Start: 2022-11-01 — End: 2023-04-17

## 2022-11-01 NOTE — Telephone Encounter (Signed)
-----   Message from Chesley Mires, MD sent at 11/01/2022  9:17 AM EDT -----  Regarding: FW: Med Change?  Contact: [phone]  ----- Message -----  From: Judith Part, MA  Sent: 10/31/2022   4:15 PM EDT  To: Chesley Mires, MD  Subject: FW: Med Change?                                    ----- Message -----  From: [name]  Sent: 10/31/2022   3:16 PM EDT  To: #  Subject: Med Change?                                      To be clear, you want to add Cymbalta in addition to gabapentin. That's fine with me.     300mg  of gabapentin 4 times a day plus 30mg  of Cymbalta daily.      Rx. [facility]   [address]    Thanks for your reply.

## 2022-11-01 NOTE — Telephone Encounter (Signed)
Cymbalta already sent.

## 2022-11-22 ENCOUNTER — Encounter

## 2022-11-28 ENCOUNTER — Ambulatory Visit
Admit: 2022-11-28 | Discharge: 2022-11-28 | Payer: Medicare Other | Attending: Internal Medicine | Primary: Family Medicine

## 2022-11-28 ENCOUNTER — Inpatient Hospital Stay: Admit: 2022-11-28 | Payer: Medicare Other | Primary: Family Medicine

## 2022-11-28 VITALS — BP 143/76 | HR 64 | Temp 98.30000°F | Resp 16 | Ht 73.0 in | Wt 295.1 lb

## 2022-11-28 DIAGNOSIS — D696 Thrombocytopenia, unspecified: Secondary | ICD-10-CM

## 2022-11-28 LAB — CBC WITH AUTO DIFFERENTIAL
Basophils %: 1 % (ref 0.0–2.0)
Basophils Absolute: 0 10*3/uL (ref 0.0–0.2)
Eosinophils %: 8 % — ABNORMAL HIGH (ref 0.5–7.8)
Eosinophils Absolute: 0.5 10*3/uL (ref 0.0–0.8)
Hematocrit: 49 % (ref 41.1–50.3)
Hemoglobin: 17.5 g/dL — ABNORMAL HIGH (ref 13.6–17.2)
Immature Granulocytes %: 1 % (ref 0.0–5.0)
Immature Granulocytes Absolute: 0 10*3/uL (ref 0.0–0.5)
Lymphocytes %: 23 % (ref 13–44)
Lymphocytes Absolute: 1.5 10*3/uL (ref 0.5–4.6)
MCH: 33.7 pg — ABNORMAL HIGH (ref 26.1–32.9)
MCHC: 35.7 g/dL — ABNORMAL HIGH (ref 31.4–35.0)
MCV: 94.4 FL (ref 82.0–102.0)
MPV: 11.8 FL (ref 9.4–12.3)
Monocytes %: 8 % (ref 4.0–12.0)
Monocytes Absolute: 0.5 10*3/uL (ref 0.1–1.3)
Neutrophils %: 59 % (ref 43–78)
Neutrophils Absolute: 4 10*3/uL (ref 1.7–8.2)
Platelets: 58 10*3/uL — ABNORMAL LOW (ref 150–450)
RBC: 5.19 M/uL (ref 4.23–5.6)
RDW: 14.2 % (ref 11.9–14.6)
WBC: 6.7 10*3/uL (ref 4.3–11.1)
nRBC: 0 10*3/uL (ref 0.0–0.2)

## 2022-11-28 LAB — COMPREHENSIVE METABOLIC PANEL
ALT: 53 U/L (ref 12–65)
AST: 50 U/L — ABNORMAL HIGH (ref 15–37)
Albumin/Globulin Ratio: 1.2 (ref 1.0–1.9)
Albumin: 3.8 g/dL (ref 3.2–4.6)
Alk Phosphatase: 57 U/L (ref 40–129)
Anion Gap: 11 mmol/L (ref 9–18)
BUN: 17 mg/dL (ref 8–23)
CO2: 26 mmol/L (ref 20–28)
Calcium: 9.5 mg/dL (ref 8.8–10.2)
Chloride: 104 mmol/L (ref 98–107)
Creatinine: 1.12 mg/dL (ref 0.80–1.30)
Est, Glom Filt Rate: 72 mL/min/{1.73_m2} (ref >60)
Globulin: 3.3 g/dL (ref 2.3–3.5)
Glucose: 121 mg/dL — ABNORMAL HIGH (ref 70–99)
Potassium: 4.7 mmol/L (ref 3.5–5.1)
Sodium: 141 mmol/L (ref 136–145)
Total Bilirubin: 1.1 mg/dL (ref 0.0–1.2)
Total Protein: 7.1 g/dL (ref 6.3–8.2)

## 2022-11-28 MED ORDER — XARELTO 10 MG PO TABS
10 | ORAL_TABLET | Freq: Every day | ORAL | 1 refills | Status: DC
Start: 2022-11-28 — End: 2023-04-08

## 2022-11-28 NOTE — Patient Instructions (Addendum)
 Patient Information from Today's Visit    Diagnosis: DVT/Thrombocytopenia      Follow Up Instructions:   - Labs reviewed, discussed low platelets  - Continue Xarelto  as prescribed for now  - Will need to hold your Xarelto  if your platelet count drops below 50,000  - Recommend repeating abdominal ultrasound to evaluate the liver and spleen to determine if there are any changes with the liver and spleen that could be contributing to your low platelet count.  Please call radiology scheduling to schedule ultrasound for the first available appointment.  Their number is: [phone]  Please make sure scans are scheduled at least 48 hours prior to the follow up visit for imaging review with us .  ** if imaging is not completed prior to your follow up appointment with us , this may result in your follow up appointment being rescheduled **    Follow up in 3 months with labs       Treatment Summary has been discussed and given to patient: N/A      Current Labs:   Hospital Outpatient Visit on 11/28/2022   Component Date Value Ref Range Status    Sodium 11/28/2022 141  136 - 145 mmol/L Final    Potassium 11/28/2022 4.7  3.5 - 5.1 mmol/L Final    Chloride 11/28/2022 104  98 - 107 mmol/L Final    CO2 11/28/2022 26  20 - 28 mmol/L Final    Anion Gap 11/28/2022 11  9 - 18 mmol/L Final    Glucose 11/28/2022 121 (H)  70 - 99 mg/dL Final    Comment: <29 mg/dL Consistent with, but not fully diagnostic of hypoglycemia.  100 - 125 mg/dL Impaired fasting glucose/consistent with pre-diabetes mellitus.  > 126 mg/dl Fasting glucose consistent with overt diabetes mellitus      BUN 11/28/2022 17  8 - 23 MG/DL Final    Creatinine 90/94/7975 1.12  0.80 - 1.30 MG/DL Final    Est, Glom Filt Rate 11/28/2022 72  >60 ml/min/1.3m2 Final    Comment:    Pediatric calculator link: https://www.kidney.org/professionals/kdoqi/gfr_calculatorped     These results are not intended for use in patients <60 years of age.     eGFR results are calculated without a  race factor using  the 2021 CKD-EPI equation. Careful clinical correlation is recommended, particularly when comparing to results calculated using previous equations.  The CKD-EPI equation is less accurate in patients with extremes of muscle mass, extra-renal metabolism of creatinine, excessive creatine ingestion, or following therapy that affects renal tubular secretion.      Calcium 11/28/2022 9.5  8.8 - 10.2 MG/DL Final    Total Bilirubin 11/28/2022 1.1  0.0 - 1.2 MG/DL Final    ALT 90/94/7975 53  12 - 65 U/L Final    AST 11/28/2022 50 (H)  15 - 37 U/L Final    Alk Phosphatase 11/28/2022 57  40 - 129 U/L Final    Total Protein 11/28/2022 7.1  6.3 - 8.2 g/dL Final    Albumin 90/94/7975 3.8  3.2 - 4.6 g/dL Final    Globulin 90/94/7975 3.3  2.3 - 3.5 g/dL Final    Albumin/Globulin Ratio 11/28/2022 1.2  1.0 - 1.9   Final    WBC 11/28/2022 6.7  4.3 - 11.1 K/uL Final    RBC 11/28/2022 5.19  4.23 - 5.6 M/uL Final    Hemoglobin 11/28/2022 17.5 (H)  13.6 - 17.2 g/dL Final    Hematocrit 90/94/7975 49.0  41.1 - 50.3 % Final  MCV 11/28/2022 94.4  82.0 - 102.0 FL Final    MCH 11/28/2022 33.7 (H)  26.1 - 32.9 PG Final    MCHC 11/28/2022 35.7 (H)  31.4 - 35.0 g/dL Final    RDW 90/94/7975 14.2  11.9 - 14.6 % Final    Platelets 11/28/2022 58 (L)  150 - 450 K/uL Final    MPV 11/28/2022 11.8  9.4 - 12.3 FL Final    nRBC 11/28/2022 0.00  0.0 - 0.2 K/uL Final    **Note: Absolute NRBC parameter is now reported with Hemogram**    Neutrophils % 11/28/2022 59  43 - 78 % Final    Lymphocytes % 11/28/2022 23  13 - 44 % Final    Monocytes % 11/28/2022 8  4.0 - 12.0 % Final    Eosinophils % 11/28/2022 8 (H)  0.5 - 7.8 % Final    Basophils % 11/28/2022 1  0.0 - 2.0 % Final    Immature Granulocytes % 11/28/2022 1  0.0 - 5.0 % Final    Neutrophils Absolute 11/28/2022 4.0  1.7 - 8.2 K/UL Final    Lymphocytes Absolute 11/28/2022 1.5  0.5 - 4.6 K/UL Final    Monocytes Absolute 11/28/2022 0.5  0.1 - 1.3 K/UL Final    Eosinophils Absolute  11/28/2022 0.5  0.0 - 0.8 K/UL Final    Basophils Absolute 11/28/2022 0.0  0.0 - 0.2 K/UL Final    Immature Granulocytes Absolute 11/28/2022 0.0  0.0 - 0.5 K/UL Final    Differential Type 11/28/2022 AUTOMATED    Final                 Please refer to After Visit Summary or MyChart for upcoming appointment information. If you have any questions regarding your upcoming schedule please reach out to your care team through MyChart or call [phone].    Please notify your assigned Nurse Navigator of any unplanned hospital admissions or Emergency Room visits within 24 hours of discharge.    -------------------------------------------------------------------------------------------------------------------  Please call our office at [phone] if you have any  of the following symptoms:   Fever of 100.5 or greater  Chills  Shortness of breath  Swelling or pain in one leg    After office hours an answering service is available and will contact a provider for emergencies or if you are experiencing any of the above symptoms.        Tinnie Clause, RN

## 2022-11-28 NOTE — Progress Notes (Signed)
 [facility]: Office Visit Established Patient    Reason for follow up:    H/o DVT  thrombocytopenia      Oncologic overview: per [facility] visit dated 07/07/19:    Secondary erythrocytosis   Hx of hemoglobin as high as 19   JAK2 negative; splenomegaly - mild   Bone marrow consistent with secondary erythrocytosis 7/16; JAK2, CALr, MPL on marrow negative    Splenomegaly with thrombocytopenia   Negative bone marrow 8/16   Hepatitis panel pending 12/16    DVT RLE 7/17   There was not enough blood to do the complete antiphospholipid testing. The lupus anticoagulant was positive which may be artifactual related to the presence of xarelto . Both factor V Leiden and the prothrombin gene mutation were absent        DIAGNOSIS     BONE MARROW CORE BIOPSY, CLOT SECTION, ASPIRATE AND PERIPHERAL BLOOD   SMEAR:   - MILDLY HYPERCELLULAR MARROW WITH ERYTHROID HYPERPLASIA; SEE NOTE   - NO SIGNIFICANT MARROW FIBROSIS IDENTIFIED ON RETICULIN STAIN     NOTE: LABORATORY STUDIES ARE NOTABLE FOR AN ELEVATED ERYTHROPOIETIN   (28.2 MIU/ML, REF 2.6-18.5), AND NEGATIVE FOR MUTATIONS OF JAK2 V617F   ([facility]), JAK2 EXONS 12-14, CALRETICULIN, AND MPL. OVERALL, THE   FEATURES ARE MOST IN KEEPING WITH SECONDARY ERYTHROCYTOSIS.     THE CASE WAS DISCUSSED WITH DR. HINES AT 9:56 AM, 11/29/2014.      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     In June 2022, patient relocated to [place] from [place]  to be closer to her children.  He has establish care with PCP and cardiology already.  He screening colonoscopy was  completed by Dr. Diego on 11/29/2020 with history of colon polyps.     Patient's presents today to discuss management of chronic anticoagulation.  He has been on anticoagulation for right lower extremity DVT diagnosed in July 2017.  He continues to take Xarelto  10 mg daily.  Based on history of coronary artery disease, he also takes aspirin 81 mg oral daily.     On evaluation today, he denies lower extremity pain or persistent swelling.  Right leg swells after a long drive.  He denies chest pain, unusual shortness of breath, nausea, vomiting, gum bleeds/nosebleeds/bloody stool/hematuria, abdominal pain, diarrhea or dysuria.  Denies fevers, drenching night sweats, early satiety, unintentional weight loss.  No falls, head trauma.     C/o mild macular rash on the dorsal aspect of left foot.  Also has chronic neuropathy involving bilateral feet-unchanged      On 06/07/2021 he underwent PTV/PopV recanalization with PA (Dr. Rox).     Interval history:  Interval history updated in the assessment and plan.    Review of Systems:  14 point ROS was negative except as per HPI      ECOG PERFORMANCE STATUS - 0-Fully active, able to carry on all pre-disease performance without restriction.    Pain - /10. None/Minimal pain - not affecting QOL     Fatigue - Failed to redirect to the Timeline version of the REVFS SmartLink.       Reviewed and updated this visit  by provider:  Tobacco  Allergies  Meds  Problems  Med Hx  Surg Hx  Fam Hx          Current Outpatient Medications   Medication Sig Dispense Refill    rivaroxaban  (XARELTO ) 10 MG TABS tablet Take 1 tablet by mouth daily (with breakfast) 90 tablet 1    lisinopril  (PRINIVIL ;ZESTRIL ) 30 MG tablet Take 1 tablet by mouth daily 30 tablet 1    zolpidem  (AMBIEN ) 5 MG tablet Take 1 tablet by mouth nightly as needed for Sleep for up to 180 days. Max Daily Amount: 5 mg 90 tablet 1    gabapentin  (NEURONTIN ) 600 MG tablet Take 1 tablet by mouth in the morning, at noon, in the  evening, and at bedtime. 360 tablet 3    allopurinol  (ZYLOPRIM ) 300 MG tablet Take 1 tablet by mouth daily 90 tablet 3    Multiple Vitamins-Minerals (THERAPEUTIC MULTIVITAMIN-MINERALS) tablet Take 1 tablet by mouth daily      vitamin D3 (CHOLECALCIFEROL ) 10 MCG (400 UNIT) TABS tablet Take 2 tablets by mouth daily      CPAP Machine MISC by Does not apply route      colchicine  (MITIGARE ) 0.6 MG capsule Take 1 capsule by mouth as needed for Pain 30 capsule 3    Alpha-Lipoic Acid 200 MG CAPS Take 300 mg by mouth      fexofenadine (ALLEGRA) 180 MG tablet Take 1 tablet by mouth daily      DULoxetine  (CYMBALTA ) 30 MG extended release capsule Take 1 capsule by mouth daily (Patient not taking: Reported on 11/28/2022) 30 capsule 3     No current facility-administered medications for this visit.        OBJECTIVE:  BP (!) 143/76 (Site: Right Upper Arm, Position: Sitting)   Pulse 64   Temp 98.3 F (36.8 C) (Oral)   Resp 16   Ht 1.854 m (6' 1)   Wt 133.9 kg (295 lb 1.6 oz)   SpO2 94%   BMI 38.93 kg/m   Wt Readings from Last 3 Encounters:   11/28/22 133.9 kg (295 lb 1.6 oz)   10/16/22 130.6 kg (288 lb)   09/09/22 130.7 kg (288 lb 3.2 oz)       Physical Exam:  Patient alert and oriented x 3, in no acute distress  Integumentary: No Pallor, no icterus  HEENT: moist mucous membranes, normal oropharynx  Lymph nodes: no cervical or axillary LAD  Cardiovascular:RRR, S1, S2 present, no m/r/g   Lungs: Clear to auscultation, no rales or wheezing, no accessory muscle use  Abdomen: Soft, and non-tender on palpation, no organomegaly, bowel sounds audible  Extremities: Mild bilateral leg edema is noted.  No calf tenderness, Homans negative.  Neurological: patient can follow commands and move all extremities    Labs:  Lab Results   Component Value Date    WBC 6.7 11/28/2022    HGB 17.5 (H) 11/28/2022    HCT 49.0 11/28/2022    MCV 94.4 11/28/2022    PLT 58 (L) 11/28/2022     Lab Results   Component Value Date    NEUTROABS 4.0 11/28/2022     LYMPHOPCT 23 11/28/2022    LYMPHSABS 1.5 11/28/2022    MONOPCT 8 11/28/2022    MONOSABS 0.5 11/28/2022    EOSPCT 8 (H) 11/28/2022    EOSABS 0.5 11/28/2022    BASOPCT 1 11/28/2022     Lab Results   Component Value Date    NA 141 11/28/2022  K 4.7 11/28/2022    CL 104 11/28/2022    CO2 26 11/28/2022    BUN 17 11/28/2022    CREATININE 1.12 11/28/2022    GLUCOSE 121 (H) 11/28/2022    CALCIUM 9.5 11/28/2022    BILITOT 1.1 11/28/2022    ALKPHOS 57 11/28/2022    AST 50 (H) 11/28/2022    ALT 53 11/28/2022    LABGLOM 72 11/28/2022    GFRAA >60 12/18/2020    GLOB 3.3 11/28/2022     Date Obtained:   11/29/2020   DIAGNOSIS        A:  ASCENDING COLON POLYPS:  FRAGMENTS OF MIXED TUBULOVILLOUS   ADENOMA.        B:  TRANSVERSE COLON POLYP:  FRAGMENTS OF MIXED TUBULOVILLOUS   ADENOMA.           sms/12/01/2020     Sign Out Date: 12/01/2020  J. Debby Verta Raddle., M.D.             Imaging:  XR THORACIC SPINE (3 VIEWS)    Result Date: 01/31/2021  1. Extensive postsurgical change without definite acute abnormality. 2. Fractured sternal wires seen within the upper sternum.    XR LUMBAR SPINE (MIN 4 VIEWS)    Result Date: 01/31/2021  1. Extensive postsurgical change without definite acute abnormality. 2. Fractured sternal wires seen within the upper sternum.    12/25/20: IMPRESSION:  No evidence of deep venous thrombosis in either lower extremity      07/24/21:IMPRESSION: US  abdomen  -Mild hepatomegaly with diffusely increased hepatic echogenicity, likely  reflecting hepatic steatosis.  -Splenomegaly. Calcifications within the spleen, possibly reflecting sequelae of  prior granulomatous disease.    Problems:  1. History of DVT (deep vein thrombosis), right lower extremity   -Long-term anticoagulation with Xarelto   -Chronic mild thrombocytopenia, without evidence of excessive bleeding-this has been worked up in the past.  He has had a bone marrow biopsy in 2017.    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  -Seasonal allergies  -Hypertension  -Sleep disturbance    PLAN:  I reviewed the results of most recent labs and imaging with the patient.  Platelet count is 61,000 and he has been switched to Eliquis  5 mg p.o. twice daily.  He is scheduled to undergo another procedure for management of superficial venous insufficiency of lower extremity in July.  Today again, I reviewed various possible causes of thrombocytopenia including but not limited to nutritional deficiencies, primary bone marrow conditions, ITP and chronic liver disease etc.  He will be referred to gastroenterology for evaluation of chronic liver disease as a possible contributor to thrombocytopenia.  His platelet counts should be closely monitored while on anticoagulation.  I have therefore ordered CBC to be checked every 6 weeks.  Anticoagulation should be held for platelet count less than 50,000 or evidence of active bleeding.  I shall plan to see Chad Rosario back in about 3 months.  I have discussed the role of repeat bone marrow biopsy and further work-up should his platelet count decline further/other cytopenias develop.  -11/27/21: I reviewed the results of most recent labs with the patient.  Erythrocytosis is again noted.  Platelet count is 72,000.  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  He will continue taking aspirin 81 mg EC daily and Eliquis  5 mg p.o. twice daily.  Follow-up with [place] [facility] as scheduled.  Encouraged weight loss.     05/28/22: Chad Rosario returns  today for six month follow up. He has been okay overall. He had recent left knee surgery for which he is recovering. 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 He is seeing vascular soon.  He denies chest pain, dizziness, nausea, vomiting, fever, black or bloody stools, spontaneous bleeding.  He has been tolerating Eliquis  5 mg BID  but has become financially toxic so he will be switching back to Xarelto  soon.  He is established with GI for management of fatty liver/NASH. Labs reviewed. Hgb remains mildly elevated but stable. Platetlets 64,000 which is low but stable from September/November results. He does see his PCP regularly. We discussed needing to hold his anticoagulation if his platelets drop below 50,000. Most likely cause of thrombocytopenia is liver disease - encouraged weight loss and avoidance of liver toxic substances like Tylenol /alcohol. Follow up in 6 months or sooner if needed.      11/28/2022: Patient returns for follow-up evaluation and review of labs and imaging.  He has switched from Eliquis  to Xarelto  and has been taking the medication compliantly at a dose of 20 mg p.o. daily.  In last few weeks, he has noted increased bruising but denies spontaneous bleeding.  He denies any pain in his legs.  He was evaluated by Dr. Rox in March.  Venous ultrasound RLE noted occluded femoral vein, it was open a year ago.  Due to chronically occluded right popliteal vein, access to the occluded femoral vein would be challenging.  Any intervention has therefore been deferred until a point when he develops worsening symptoms (swelling, pain, discoloration).  There is no evidence of such on today's exam.  His platelet count has been trending down slowly and is 58,000 on today's labs.  In view of increased bruising, we shall reduce the dose of Xarelto  to 10 mg p.o. daily.  Patient acknowledged that the dose may need to be increased back to 20 mg daily, should he develop any acute thrombotic event.  Likely etiology for thrombocytopenia is liver disease/splenomegaly.  Patient reports a history of binge drinking until his 63s.  Cautioned him against further alcohol consumption.  Advised avoidance of acetaminophen .  We shall obtain ultrasound liver/spleen for further evaluation and likely refer him back to GI for further evaluation.  Return  office visit in 3 months or sooner if needed with labs and imaging as above.  All questions were answered to the best of my abilities.  Total visit time 40 minutes.      Chad Birdwell, MD  [facility]  [address]  [place], [place] [address]  Office : [phone]  Fax : [phone]           Elements of this note have been dictated using speech recognition software. As a result, errors of speech recognition may have occurred.

## 2022-11-29 MED ORDER — ALLOPURINOL 300 MG PO TABS
300 | ORAL_TABLET | Freq: Every day | ORAL | 3 refills | Status: DC
Start: 2022-11-29 — End: 2023-10-27

## 2022-12-03 ENCOUNTER — Ambulatory Visit: Admit: 2022-12-03 | Payer: MEDICARE | Primary: Family Medicine

## 2022-12-03 DIAGNOSIS — D696 Thrombocytopenia, unspecified: Secondary | ICD-10-CM

## 2022-12-05 ENCOUNTER — Telehealth

## 2022-12-05 NOTE — Telephone Encounter (Addendum)
 MyChart msg routed to pt  Referral to GI pended to MD for signature.     ----- Message from Dr. Zeeshan Ali, MD sent at 12/04/2022  6:58 PM EDT -----  Ultrasound shows liver and spleen enlargement.  Please inform the patient as these findings were suspected and discussed with the patient during office visit. Please place routine GI consult.

## 2022-12-11 ENCOUNTER — Telehealth

## 2022-12-11 MED ORDER — PREGABALIN 75 MG PO CAPS
75 | ORAL_CAPSULE | Freq: Two times a day (BID) | ORAL | 1 refills | Status: DC
Start: 2022-12-11 — End: 2023-04-17

## 2022-12-11 NOTE — Telephone Encounter (Signed)
 Discussed with the patient starting Lyrica  instead of gabapentin .  Recommend patient to slowly Penton down from 3 pills/day to 1 pill/day over 2 to 3 weeks.  Recommend to start Lyrica  once patient is off gabapentin ---start with 75mg  daily for 3-5 days, then increase to 75mg  twice a day for about two weeks , then increase to 150mg  twice a day.

## 2023-01-16 ENCOUNTER — Encounter

## 2023-01-16 ENCOUNTER — Ambulatory Visit: Admit: 2023-01-16 | Discharge: 2023-01-16 | Payer: MEDICARE | Primary: Family Medicine

## 2023-01-16 DIAGNOSIS — R7989 Other specified abnormal findings of blood chemistry: Secondary | ICD-10-CM

## 2023-01-16 LAB — PROTIME-INR
INR: 1.7
Protime: 20.5 s — ABNORMAL HIGH (ref 11.3–14.9)

## 2023-01-16 NOTE — Telephone Encounter (Signed)
Elizabeth called from [facility] - Pt is having surgery Nov 7th and he needs stop Xarleto 3 days prior. You can put this in Epic or call [facility].

## 2023-01-16 NOTE — Progress Notes (Signed)
GASTROENTEROLOGY CLINIC VISIT    CC: Hepatomegaly     HPI:   Chad Rosario is 67 y.o. y/o male  referred for hepatomegaly. He denies formal diagnosis of liver disease but recalls being told in 2007 that he should get his liver checked out. He is following with hematology for thrombocytopenia and was wound to have splenomegaly and hepatomegaly on imaging. Today he denies specific GI complaints. Denies GI bleeding,  jaundice, pruritus, abdominal pain, change in bowel habits, or weight loss. Denies herbal supplement use. Denies [facility] liver disease or liver cancer.     On Xarelto for h/o DVT.     PMH/PSH:   CABG   HLD  DVT  HTN     FMH:   Denies FMH gastric or colorectal cancer   Denies FMH autoimmune disease     SocHx:   Smoking - never   Alcohol use - yes, sometimes daily has about 6 beers weekly and some liquor on occasion     PE:   Vitals:    01/16/23 1357   BP: (!) 149/80   Pulse: 61   Resp: 18   SpO2: 98%      General:  The patient appears well-nourished, and is in no acute distress.    Respiratory: Respiratory effort is normal.   Cardiovascular:  Regular rate and rhythm.     Abdomen:  Soft, non tender.No distention.   Neurologic:  Alert and oriented x3.      Labs:  Lab Results   Component Value Date    HGB 17.5 (H) 11/28/2022    WBC 6.7 11/28/2022    PLT 58 (L) 11/28/2022    MCV 94.4 11/28/2022    IRON 97 07/10/2021    FERRITIN 27 07/10/2021    CREATININE 1.12 11/28/2022    ALT 53 11/28/2022    AST 50 (H) 11/28/2022     Imaging:   Abdominal ultrasound 12/03/2022   Limited evaluation due to overlying bowel gas.     Liver:  Measures enlarged at 21 cm.    Parenchyma demonstrates Increased echogenicity.  No sonographic evidence of a mass.     Spleen:  The spleen measures enlarged at 16 cm.     Free fluid:  None.    Endoscopy:   Colonoscopy 11/2020, colon polyps, path mixed tubulovillous adenoma     Assessment/Plan:   Hepatomegaly and splenomegaly   Thrombocytopenia   Constipation, improved with stool softeners as  needed      - Fibro scan ordered   - Hepatitis serology ordered   - RTC after testing       Leda Roys, MD  [facility]

## 2023-01-17 LAB — HEPATITIS B SURFACE ANTIBODY: Hep B S Ab: <3.5 m[IU]/mL

## 2023-01-17 LAB — HEPATITIS B E ANTIGEN: Hep B E Ag: NEGATIVE

## 2023-01-17 LAB — HEPATITIS A ANTIBODY, IGM: Hep A IgM: NONREACTIVE

## 2023-01-17 LAB — HCV INTERPRETATION

## 2023-01-17 LAB — HEPATITIS B CORE ANTIBODY, IGM: Hep B Core Ab, IgM: NEGATIVE

## 2023-01-17 LAB — HEPATITIS B SURFACE ANTIGEN: Hepatitis B Surface Ag: NONREACTIVE

## 2023-01-17 LAB — HEPATITIS C AB, RFLX TO QT BY PCR: Hepatitis C Ab: NONREACTIVE {s_co_ratio}

## 2023-01-17 NOTE — Telephone Encounter (Signed)
I left a message on [facility] and the pts answering machine that he needs to stop xarelto 3 days prior to his surgery

## 2023-01-17 NOTE — Telephone Encounter (Signed)
Chad Rosario [phone]

## 2023-01-17 NOTE — Telephone Encounter (Signed)
SEND ELIZABETH NOTE BACK THAT I AM IN AGREEMENT TO STOP Carlena Hurl

## 2023-01-18 ENCOUNTER — Encounter

## 2023-01-19 LAB — CELIAC AB TTG DGP TIGA
Gliadin Antibodies IgA: 9 U (ref 0–19)
Gliadin Antibodies IgG: 3 U (ref 0–19)
IgA: 446 mg/dL — ABNORMAL HIGH (ref 61–437)
TTG, IgG: 6 U/mL — ABNORMAL HIGH (ref 0–5)
Tissue Transglutaminase IgA: <2 U/mL (ref 0–3)

## 2023-01-20 MED ORDER — ZOLPIDEM TARTRATE 5 MG PO TABS
5 MG | ORAL_TABLET | ORAL | 0 refills | Status: AC
Start: 2023-01-20 — End: 2023-04-20

## 2023-01-22 ENCOUNTER — Inpatient Hospital Stay: Admit: 2023-01-22 | Payer: MEDICARE | Primary: Family Medicine

## 2023-01-22 DIAGNOSIS — R7989 Other specified abnormal findings of blood chemistry: Secondary | ICD-10-CM

## 2023-01-22 NOTE — Telephone Encounter (Signed)
Chart reviewed.  Returned call.  Informed the reason Dr. Karie Mainland did not mark that he is cleared for surgery is because we cannot clear him overall for such.  We can only clear him from a hematology perspective which is why he wrote in the OK for Xarelto hold and his platelets are fine by [place] if the surgeon is OK w/ his platelet count.  Generally the surgeon will dictate to [place] whether the platelet count is a problem for them to proceed w/ procedure as they would better understand the bleeding risks for the procedure they are performing.  Verbalized understanding.

## 2023-01-22 NOTE — Telephone Encounter (Signed)
Physician provider: Dr. Karie Mainland  Reason for today's call (Please detail here patients chief complaint): medical clearance  Last office visit:n/a  Patient Callback Number: [phone]  Was callback number verified?: Yes  Preferred pharmacy (If refill request): n/a  Calls to office within the last 48 hours?:No    Patient notified that their information will be routed to the [facility] clinical triage team for review. Patient is advised that they will receive a phone call from the triage department. If symptom related and symptoms worsen before receiving a call back, the patient has been advised to proceed to the nearest ED.          Chad Rosario from [facility] would like Medical Clearance for surgery on 11/07    [phone]

## 2023-02-17 NOTE — Progress Notes (Signed)
 [facility] [address] ORTHO/NEURO  [facility]  [address]  [place] [facility] [address]  [phone]  Date of Encounter:  02/17/2023    PATIENT INFORMATION:  Name: Chad Rosario  Age:

## 2023-02-24 NOTE — Telephone Encounter (Signed)
 Philip  [place] PA called spoke with patient, very minimum drainage. Patient reports swelling in bilateral feet/ ankles. Advised to wear compression hose keep legs elevated. Was advised to call PCP if symptoms worsen as patient takes multiple Blood Pressure

## 2023-03-05 ENCOUNTER — Encounter: Payer: MEDICARE | Primary: Family Medicine

## 2023-03-06 ENCOUNTER — Encounter: Payer: MEDICARE | Attending: Internal Medicine | Primary: Family Medicine

## 2023-03-10 NOTE — Telephone Encounter (Signed)
 Physician provider: Dr. Karie Mainland  Reason for today's call (Please detail here patients chief complaint): Pt need to rs appt.   Last office visit:na  Patient Callback Number: [phone]  Was callback number verified?: Yes  Preferred pharmacy (If refill request

## 2023-03-10 NOTE — Progress Notes (Signed)
 [facility] [address] ORTHO/NEURO  [facility]  [address]  [place] [facility] [address]  [phone]  Date of Encounter:  03/10/2023    PATIENT INFORMATION:  Name: Chad Rosario  Age:

## 2023-03-11 ENCOUNTER — Ambulatory Visit
Admit: 2023-03-11 | Discharge: 2023-03-11 | Payer: MEDICARE | Attending: Family Medicine | Admitting: Family Medicine | Primary: Family Medicine

## 2023-03-11 VITALS — BP 120/78 | HR 78 | Temp 97.90000°F | Ht 73.0 in | Wt 296.0 lb

## 2023-03-11 DIAGNOSIS — R5383 Other fatigue: Secondary | ICD-10-CM

## 2023-03-11 DIAGNOSIS — R5381 Other malaise: Principal | ICD-10-CM

## 2023-03-11 LAB — COMPREHENSIVE METABOLIC PANEL
ALT: 24 U/L (ref 8–55)
AST: 26 U/L (ref 15–37)
Albumin/Globulin Ratio: 0.9 — ABNORMAL LOW (ref 1.0–1.9)
Albumin: 3.3 g/dL (ref 3.2–4.6)
Alk Phosphatase: 66 U/L (ref 40–129)
Anion Gap: 11 mmol/L (ref 7–16)
BUN: 19 mg/dL (ref 8–23)
CO2: 25 mmol/L (ref 20–29)
Calcium: 9.2 mg/dL (ref 8.8–10.2)
Chloride: 101 mmol/L (ref 98–107)
Creatinine: 1.26 mg/dL (ref 0.80–1.30)
Est, Glom Filt Rate: 63 mL/min/{1.73_m2} (ref >60)
Globulin: 3.8 g/dL — ABNORMAL HIGH (ref 2.3–3.5)
Glucose: 120 mg/dL — ABNORMAL HIGH (ref 70–99)
Potassium: 5.6 mmol/L — ABNORMAL HIGH (ref 3.5–5.1)
Sodium: 137 mmol/L (ref 136–145)
Total Bilirubin: 1 mg/dL (ref 0.0–1.2)
Total Protein: 7.1 g/dL (ref 6.3–8.2)

## 2023-03-11 LAB — LIPID PANEL
Chol/HDL Ratio: 3.1 (ref 0.0–5.0)
Cholesterol, Total: 144 mg/dL (ref 0–200)
HDL: 46 mg/dL (ref 40–60)
LDL Cholesterol: 82 mg/dL (ref 0–100)
Triglycerides: 79 mg/dL (ref 0–150)
VLDL Cholesterol Calculated: 16 mg/dL (ref 6–23)

## 2023-03-11 LAB — CBC WITH AUTO DIFFERENTIAL
Basophils %: 1 % (ref 0.0–2.0)
Basophils Absolute: 0 10*3/uL (ref 0.0–0.2)
Eosinophils %: 6 % (ref 0.5–7.8)
Eosinophils Absolute: 0.5 10*3/uL (ref 0.0–0.8)
Hematocrit: 46 % (ref 41.1–50.3)
Hemoglobin: 16.3 g/dL (ref 13.6–17.2)
Immature Granulocytes %: 1 % (ref 0.0–5.0)
Immature Granulocytes Absolute: 0.1 10*3/uL (ref 0.0–0.5)
Lymphocytes %: 16 % (ref 13–44)
Lymphocytes Absolute: 1.2 10*3/uL (ref 0.5–4.6)
MCH: 33 pg — ABNORMAL HIGH (ref 26.1–32.9)
MCHC: 35.4 g/dL — ABNORMAL HIGH (ref 31.4–35.0)
MCV: 93.1 fL (ref 82–102)
MPV: 12.1 fL (ref 9.4–12.3)
Monocytes %: 10 % (ref 4.0–12.0)
Monocytes Absolute: 0.7 10*3/uL (ref 0.1–1.3)
Neutrophils %: 66 % (ref 43–78)
Neutrophils Absolute: 4.7 10*3/uL (ref 1.7–8.2)
Platelets: 129 10*3/uL — ABNORMAL LOW (ref 150–450)
RBC: 4.94 M/uL (ref 4.23–5.6)
RDW: 14.4 % (ref 11.9–14.6)
WBC: 7.1 10*3/uL (ref 4.3–11.1)
nRBC: 0 10*3/uL (ref 0.0–0.2)

## 2023-03-11 LAB — HEMOGLOBIN A1C
Estimated Avg Glucose: 92 mg/dL
Hemoglobin A1C: 4.8 % (ref 0–5.6)

## 2023-03-11 LAB — FOLATE: Folate: 19.8 ng/mL — ABNORMAL HIGH (ref 3.1–17.5)

## 2023-03-11 LAB — VITAMIN D 25 HYDROXY: Vit D, 25-Hydroxy: 52.1 ng/mL (ref 30.0–100.0)

## 2023-03-11 LAB — TSH: TSH, 3rd Generation: 1.37 u[IU]/mL (ref 0.270–4.200)

## 2023-03-11 LAB — VITAMIN B12: Vitamin B-12: 905 pg/mL (ref 193–986)

## 2023-03-11 NOTE — Consults (Signed)
-------------------------------------------------------------------------------    Attestation signed by Lillard Anes, MD at 03/12/2023  5:10 AM (Updated)  Patient seen and evaluated by me 03/11/2023.  I agree with documentation below with the following

## 2023-03-11 NOTE — H&P (Signed)
 History & Physical Exam   Current Date & Time:  03/11/23 - 12:47 PM    Attending: Elza Rafter, MD       Patient: Chad Rosario DOB: 1955/04/08 MRN: [id number]     Subjective   Presenting Problem: No chief complaint on file.      HPI: [name]

## 2023-03-11 NOTE — Procedures (Signed)
 [facility]    OPERATIVE REPORT    ORTHOPAEDIC SURGERY  PROVIDER: DR Julious Payer    DATE OF PROCEDURE: 03/11/2023    PATIENT INFORMATION  Chad Rosario 67 y.o. male 1956-02-08  MRN: [id number]       LOCATION:  [facility]        P

## 2023-03-11 NOTE — Op Note (Signed)
 Patient taken to room 228. Report given to Odelia Gage RN and Paula Compton RN All questions answered. Dressing assessed together. Family at bedside. Wound vac currently right shoulder intact  clamp opened and witnessed by receiving RN. Patient alert and or

## 2023-03-11 NOTE — Progress Notes (Signed)
 Chad Rosario  October 25, 1955 is a 67 y.o. male ,Established patient, here for evaluation of the following chief complaint(s):   Chief Complaint   Patient presents with    6 Month Follow-Up     Pt is fasting has a procedure this afternoon on right shoulder due

## 2023-03-11 NOTE — Assessment & Plan Note (Signed)
 Chronic, at goal (stable), followed by hematology who believes that this may be due to erythrocytosis. JAK-2 marker is negative.

## 2023-03-12 ENCOUNTER — Encounter: Payer: MEDICARE | Attending: Internal Medicine | Primary: Family Medicine

## 2023-03-12 NOTE — Progress Notes (Deleted)
 [facility]: Office Visit Established Patient    Reason for follow up:    H/o DVT  thrombocytopenia      Oncologic overview: per [facility] cancer institute visit dated 07/07/19:    Secondary erythrocytosis   Hx of hemoglobin as

## 2023-03-12 NOTE — Progress Notes (Signed)
 Progress Note Current Date & Time:  03/12/23 - 8:23 AM    Attending: Elza Rafter, MD     Patient: Chad Rosario DOB: Aug 10, 1955 MRN: [id number]     Subjective  Subjective   Patient 1 day status post I&D and revision right reverse total shoulder

## 2023-03-12 NOTE — Progress Notes (Signed)
 Interdisciplinary Team Rounds:  Reviewed by the following disciplines:    Care Management  Chaplain  Nursing  Palliative Care/ Hospice  Physician  Registered Dietician  Therapy (PT OT ST)    Barriers: Post Op.on IV treatments.       ID consulted. Wound car

## 2023-03-12 NOTE — Progress Notes (Signed)
 Assessment Note    Adult Assessment  Admit from home with spouse. Was mostly IADLS prior to admission. Patient anticipate returning home at discharge.     Currently on wound vac, states prior wound vac was managed by Outpatient MD office.     PT recommendi

## 2023-03-12 NOTE — Consults (Addendum)
 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

## 2023-03-12 NOTE — Initial Assessments (Signed)
 Acute Care Physical Therapy  [facility]         Initial Evaluation    ASSESSMENT  Assessment: Patient presents S/P right shoulder revision surgery surgery with impaired joint mobility, motor function, muscle performance, and range of mot

## 2023-03-13 NOTE — Progress Notes (Signed)
 ID Inpatient Progress Note    Virtual Visit - Video    Telehealth Platform: [employer]    Indication for Telemedicine Visit:  follow up    Patient Location / Address:  Hospital - Inpatient  [facility]    Provider facility same as patient? No

## 2023-03-13 NOTE — Discharge Summary (Signed)
 Discharge Summary     Date/Time: 03/13/23 - 2:41 PM   Admitted: 03/11/2023   Discharged: 03/13/23   Total LOS:  LOS: 2 days     Admitting MD: Elza Rafter, MD   Discharging MD: Philip Alton [place], PA        Demographics     Patient: Chad Rosario [facility]

## 2023-03-13 NOTE — Consults (Signed)
 CM SW consulted for home IV abx, [facility] referral.     MD confirmed order was sent to [facility]. CM called Sonia [facility], [phone], and was told they had not received the orders. CM SW faxed over a copy of the CM SW consult for IV abx.  [phone]. The IV ab

## 2023-03-13 NOTE — Progress Notes (Signed)
 Case Management Discharge Evaluation    Summary  Chad Rosario is a 67 y.o. male admitted for   Patient Active Problem List   Diagnosis   . History of thoracic spinal fusion   . History of lumbar fusion   . 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

## 2023-03-13 NOTE — Progress Notes (Signed)
 Interdisciplinary Team Rounds:  Reviewed by the following disciplines:    Care Management  Chaplain  Nursing  Palliative Care/ Hospice  Physician  Registered Dietician  Therapy (PT OT ST)    Barriers: Post Op.On IV treatments.      ID consulted. Pending fi

## 2023-03-13 NOTE — Progress Notes (Signed)
 CM Progress Note    DC Plan: Home.     Barriers: Post Op.On IV treatments.      ID consulted. Pending final abx need at discharge. Wound care consulted.  Wound vac. Prior management at MD office.     CM sent message to MD to verify home dc antibiotic needs

## 2023-03-13 NOTE — Progress Notes (Signed)
 Day of Therapy: 3  Current regimen: Vancomycin 2000 mg IV Q12 hours  Indication: SSTI    Labs:  Tmax: 99.7 in last 24 hrs  WBC: 9.8 on 12/18  Renal function assessment: stable  SCr: 1.28 on 12/18  SCr is increasing  BUN: 20 on 12/18  Estimated Creatinine C

## 2023-03-31 NOTE — Progress Notes (Signed)
[facility] [place] [facility] ORTHO/NEURO  [facility] [place]  [address]  [facility] [id number]  [phone]  Date of Encounter:  03/31/2023    PATIENT INFORMATION:  Name: Chad Rosario  Age:  68 y.o.  Sex:  male  DOB:  16-Aug-1955    MRN:  [id number]  Cell Phone:    Telephone Information:   Mobile [phone]     PCP:  [name], MD  Insurance:  Payor: MEDICARE / Plan: MEDICARE PART A & B / Product Type: *No Product type* /     CHIEF COMPLAINT:    Chief Complaint   Patient presents with   . Right Shoulder - Follow-up     Post op xrays today.     03/11/23 I&D Revision right reverse total shoulder arthroplasty     HPI:   68 year old male presents for follow-up of right reverse total shoulder arthroplasty.  His incision continues to heal excellently.  He had scant serous drainage on his dressing that was placed 2 days prior.  No fever or chills.       PAST MEDICAL HISTORY:  Past Medical History:   Diagnosis Date   . Chronic venous insufficiency    . Coronary artery disease    . Dvt femoral (deep venous thrombosis) (HCC)    . Fatty liver    . HLD (hyperlipidemia)    . Hypertension    . Sleep apnea          PAST SURGICAL HISTORY:  Past Surgical History:   Procedure Laterality Date   . BACK SURGERY      fusion   . CARPAL TUNNEL RELEASE Bilateral    . CHOLECYSTECTOMY     . CORONARY ARTERY BYPASS GRAFT  2015    with stent.   Marland Kitchen KNEE ARTHROSCOPY Bilateral    . LEG SURGERY     . REVERSE TOTAL SHOULDER ARTHROPLASTY Right 01/30/2023    Julious Payer MD   . SHOULDER ARTHROSCOPY Bilateral    . SHOULDER SURGERY Right 03/11/2023    INCISION & DRAINAGE, SHOULDER AREA; DEEP ABSCESS OR HEMATOMA *RIGHT SHOULDER INCISION AND DRAINAGE, POSSIBLE REVISION OF RIGHT REVERSE TOTAL SHOULDER ARTHROPLASTY [facility]T   . TOTAL HIP ARTHROPLASTY Left        FAMILY HISTORY:  No family history on file.    SOCIAL HISTORY:  Social History     Socioeconomic History   . Marital status:  Married   Tobacco Use   . Smoking status: Never   . Smokeless tobacco: Never   Vaping Use   . Vaping status: Never Used   Substance and Sexual Activity   . Alcohol use: Yes     Alcohol/week: 10.0 standard drinks of alcohol     Types: 2 Glasses of wine, 4 Cans of beer, 4 Servings of liquor per week     Comment: social   . Drug use: Never       MEDICATIONS:  Current Outpatient Medications   Medication Sig Dispense Refill   . acetaminophen (TYLENOL) 500 mg Take 1,000 mg by mouth every 6 (six) hours as needed     . allopurinoL (ZYLOPRIM) 300 mg tablet Take by mouth every morning     . alpha lipoic acid 200 mg Take 300 mg     . amitriptyline (ELAVIL) 10 mg tablet at bedtime as needed (Patient not taking: Reported on 03/11/2023)     . ascorbic acid (VITAMIN C ORAL) Take  by mouth every morning     . aspirin (ECOTRIN) 81 mg EC tablet Take 1 tablet (81 mg) by mouth daily Start taking day of surgery and then stop once taking Xarelto again. 30 tablet 0   . azelastine (ASTELIN) 137 mcg (0.1 %) nasal spray 1 spray into each nostril daily as needed     . CPAP Take by mouth nightly Use as instructed     . cyanocobalamin, vitamin B-12, (VITAMIN B12 ORAL) Take by mouth every morning     . docusate sodium (COLACE) 100 mg capsule Take 1 capsule (100 mg) by mouth 2 (two) times a day as needed for constipation for up to 10 days 20 capsule 0   . doxycycline (VIBRAMYCIN) 100 mg capsule Take 1 capsule (100 mg) by mouth 2 (two) times a day 60 capsule 0   . ergocalciferol (DRISDOL) 1,250 mcg (50,000 unit) capsule Take by mouth daily     . fexofenadine (ALLEGRA) 60 mg tablet Take 60 mg by mouth every morning     . gabapentin (NEURONTIN) 300 mg capsule Take 1 capsule (300 mg) by mouth 3 (three) times a day 90 capsule 0   . lisinopriL (ZESTRIL) 20 mg tablet Take 20 mg by mouth every morning     . naloxone (NARCAN) 4 mg/actuation nasal spray Place 1 spray (4 mg) into one nostril once as needed for opioid overdose for up to 1 dose 1 each 0   .  ondansetron (ZOFRAN ODT) 4 mg disintegrating tablet Take 1 tablet (4 mg) by mouth 3 (three) times a day as needed for nausea or vomiting for up to 10 days 30 tablet 0   . pyridoxine HCl, vitamin B6, (VITAMIN B-6 ORAL) Take by mouth every morning     . rivaroxaban (XARELTO) tablet Take 1 tablet (20 mg) by mouth every morning DO NOT start until Saturday 03/16/23 then take as previously prescribed. 30 tablet 0   . senna (SENOKOT) 8.6 mg tablet Take 2 tablets by mouth at bedtime as needed for constipation for up to 10 days 20 tablet 0   . zolpidem (AMBIEN) 10 mg tablet Take 10 mg by mouth at bedtime as needed       No current facility-administered medications for this visit.       ALLERGIES:  Hydrocodone-acetaminophen, Losartan, Amlodipine, Rosuvastatin, Atorvastatin, and Ezetimibe      PHYSICAL EXAMINATION:  General: Well-developed well-nourished 68 y.o. malein no acute distress  Cardiovascular: Regular rhythm by palpation of distal pulse, normal color and temperature, no concerning varicosities on symptomatic side  Lungs: No labored breathing or wheezing appreciated  Neuro: Alert and oriented 3  Psychiatric: well oriented to person, place and time, demonstrates normal mood and affect  Skin: No rashes, lesions or ulcers, normal temperature, turgor, and texture on uninvolved extremity    MSK:  Incision appears to be healing well there was scant serous drainage noted on his dressing that was placed 2 days prior to today's visit.  No active bleeding or drainage.  All staples are in place, no wound dehiscence, no erythema or warmth, no palpable hematoma or seroma, no induration.  Neurovascular intact.    There were no vitals taken for this visit.       IMAGING:      ASSESSMENT(S):  1. Right shoulder pain, unspecified chronicity          TREATMENT:    Plan:  Dressing change today 03/31/2023.  Can remain in place for the next week then  if he has no drainage or needing for dressing changes he may remove it.  Will follow-up in  2 weeks for wound recheck.      Procedure:  Procedures        Orders:  Orders Placed This Encounter   Procedures   . XR Shoulder 2+ Vw Right     Standing Status:   Future     Number of Occurrences:   1     Expiration Date:   06/28/2024     Select reason for exam method::   Critical Trauma     Release to patient:   Immediate       Rx:  Patient is to continue all medications as directed by prescribing physicians. Continuations on today's visit are made based on the patient's report of current medications      Follow Up:  Return in about 2 months (around 05/29/2023).    Notes: Patient is to continue all medications as directed by prescribing physicians. Continuations on today's visit are made based on the patient's report of current medications.     Current Meds Verified: Current meds/immunizations reviewed, including purpose with pt. Med Recon list given to pt/family. Pt advised to discard old med lists and provide all providers with current list at each visit and carry list with them in case of emergency    .I, as attending physician, participated in the care of this patient and was physically present for the key or critical components of the services provided.   Patient was seen and evaluated with our orthopedic resident/fellow. All imaging was reviewed by myself and discussed with patient. I agree with our assessment and plan going forward. All questions/concerns answered and prognosis discussed.        Julious Payer, MD       Current view: Showing all answers           Promis Cat V2.0 - Upper Extremity Function       Question 03/25/2023  9:36 AM EST - Filed by Patient 03/22/2023  9:43 AM EST - Filed by Patient 03/18/2023 11:49 AM EST - Filed by Patient    PROMIS Upper Extremity Function T-Score (range: 10 - 90) 29 (severe dysfunction)* 30 (moderate dysfunction)* 29 (severe dysfunction)*          Promis Cat V1.1 - Pain Interference       Question 03/25/2023  9:36 AM EST - Filed by Patient 03/22/2023  9:43 AM EST - Filed  by Patient 03/18/2023 11:50 AM EST - Filed by Patient    PROMIS Pain Interference T-Score (range: 10 - 90) (range: 10 - 90) 60 (mild) 57 (mild) 56 (mild)          Promis Cat V2.0 - Physical Function       Question 03/25/2023  9:37 AM EST - Filed by Patient 03/22/2023  9:44 AM EST - Filed by Patient 03/18/2023 11:50 AM EST - Filed by Patient    PROMIS Physical Function T-Score (range: 10 - 90) 38 (moderate dysfunction)* 38 (moderate dysfunction)* 38 (moderate dysfunction)*          Promis Cat V1.0 - Depression       Question 03/25/2023  9:37 AM EST - Filed by Patient 03/22/2023  9:44 AM EST - Filed by Patient 03/18/2023 11:51 AM EST - Filed by Patient    PROMIS Depression T-Score (range: 10 - 90) 50 (within normal limits) 51 (within normal limits) 51 (within normal limits)  Ph Col Ortho Sports Related Questionnaire       Question 03/25/2023  9:37 AM EST - Filed by Patient    Are you here today for a sports related injury? No          Promis Scale V1.2 - Global Health       Question 03/25/2023  9:38 AM EST - Filed by Patient 03/22/2023  9:46 AM EST - Filed by Patient 03/18/2023 11:52 AM EST - Filed by Patient    Please respond to each question or statement.       In general, would you say your health is: Good Good Good    In general, would you say your quality of life is: Mcarthur Rossetti Fair    In general, how would you rate your physical health? Good Good Good    In general, how would you rate your mental health, including your mood and your ability to think? Very good Good Good    In general, how would you rate your satisfaction with your social activities and relationships? Fair Fair Fair    In general, please rate how well you carry out your usual social activities and roles. 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 Moderately Moderately Moderately    In the past 7 days       How often have you been bothered by emotional problems such as feeling anxious, depressed or irritable? Rarely Rarely Sometimes    How would you rate your fatigue on average? Moderate Moderate Mild    How would you rate your pain on average? 2 3 4     PROMIS Global Mental Health T-Score (range: 20 - 70) 44 (Good) 41 (Fair)* 39 (Fair)*    PROMIS Global Physical Health T-Score (range: 15 - 70) 42 (Fair)* 42 (Fair)* 42 (Fair)*    PROMIS General Health Score (range: 1 - 5) 3 3 3     PROMIS Global Social Activities & Roles Score (range: 1 - 5) 3 2 3           *Some images could not be shown.

## 2023-04-02 ENCOUNTER — Encounter: Payer: MEDICARE | Primary: Family Medicine

## 2023-04-03 ENCOUNTER — Encounter

## 2023-04-03 NOTE — Telephone Encounter (Signed)
Patient called stating he was returning a call from LK.

## 2023-04-03 NOTE — Telephone Encounter (Signed)
[name], MD  Bea Laura, RN  Caller: Unspecified (Today,  3:29 PM)  If he is feeling poorly, he should go to the ER before his appointment.  His appointment is soon.

## 2023-04-03 NOTE — Telephone Encounter (Signed)
Advised patient of Dr. Hillard Danker response.Strongly encouraged ER for any persistent chest pain or SOB, or if he feels he is in distress. Patient verbalized understanding.

## 2023-04-03 NOTE — Telephone Encounter (Signed)
Appointment Request  (Newest Message First)  Lyndee Hensen routed conversation to [facility] Cardiology Triage2 hours ago (12:54 PM)     [name]  P [facility] Cardiology [facility] Desk Staff3 hours ago (11:44 AM)       Appointment Request From: [name]     With Provider: Dr. Amelia Jo, MD [[facility]]     Preferred Date Range: 04/07/2023 - 04/25/2023     Preferred Times: Monday Morning, Tuesday Morning, Wednesday Morning, Thursday Morning, Friday Morning     Reason for visit: Request an Appointment     Comments:  Shortness of breath with exertion, wheezing

## 2023-04-03 NOTE — Telephone Encounter (Signed)
 Left message on voicemail for patient to call this triage nurse.

## 2023-04-03 NOTE — Telephone Encounter (Signed)
Increasing SOB on exertion x 1 month. Notices increased SOB when walking from car to store. Recovers quickly.  Occasional slight wheezing.  No cough. Afebrile.  Denies chest pain.   Frequent slight swelling in feet and legs to knees that improves overnight and increases when up during the day.   Gained about 20 lbs in 1 1/2 years.   Has not been able to walk or exercise as much as usual for past year because of 2 knee surgeries and 2 shoulder surgeries in past year.   Had increased SOB when running prior to CABG, several years ago.   Higher BP-150/80 for 2-3 months.   Resting HR<70.   Taking lisinopril 30 mg qd and Xarelto 10 mg qd.     Patient asks for appointment with Dr. Freddi Starr.     Scheduled next available appointment with Dr. Freddi Starr on 04/17/23 at 2:00 pm at [facility]. Advised patient that I will notify Dr. Freddi Starr of above and call with his response. Advised patient to go to [facility] ER for immediate evaluation of any persistent chest pain or SOB, or if he feels he is in distress. Patient verbalized understanding.

## 2023-04-04 LAB — COMPREHENSIVE METABOLIC PANEL
ALT: 38 U/L (ref 8–55)
AST: 44 U/L — ABNORMAL HIGH (ref 15–37)
Albumin/Globulin Ratio: 1 (ref 1.0–1.9)
Albumin: 3.7 g/dL (ref 3.2–4.6)
Alk Phosphatase: 63 U/L (ref 40–129)
Anion Gap: 11 mmol/L (ref 7–16)
BUN: 21 mg/dL (ref 8–23)
CO2: 24 mmol/L (ref 20–29)
Calcium: 9.3 mg/dL (ref 8.8–10.2)
Chloride: 102 mmol/L (ref 98–107)
Creatinine: 1.15 mg/dL (ref 0.80–1.30)
Est, Glom Filt Rate: 70 mL/min/{1.73_m2} (ref >60)
Globulin: 3.5 g/dL (ref 2.3–3.5)
Glucose: 107 mg/dL — ABNORMAL HIGH (ref 70–99)
Potassium: 4.8 mmol/L (ref 3.5–5.1)
Sodium: 137 mmol/L (ref 136–145)
Total Bilirubin: 0.9 mg/dL (ref 0.0–1.2)
Total Protein: 7.2 g/dL (ref 6.3–8.2)

## 2023-04-04 LAB — CBC WITH AUTO DIFFERENTIAL
Basophils %: 0.7 % (ref 0.0–2.0)
Basophils Absolute: 0.06 10*3/uL (ref 0.00–0.20)
Eosinophils %: 9.4 % — ABNORMAL HIGH (ref 0.5–7.8)
Eosinophils Absolute: 0.77 10*3/uL (ref 0.00–0.80)
Hematocrit: 48 % (ref 41.1–50.3)
Hemoglobin: 16.1 g/dL (ref 13.6–17.2)
Immature Granulocytes %: 0.4 % (ref 0.0–5.0)
Immature Granulocytes Absolute: 0.03 10*3/uL (ref 0.0–0.5)
Lymphocytes %: 19.6 % (ref 13.0–44.0)
Lymphocytes Absolute: 1.6 10*3/uL (ref 0.50–4.60)
MCH: 32 pg (ref 26.1–32.9)
MCHC: 33.5 g/dL (ref 31.4–35.0)
MCV: 95.4 fL (ref 82–102)
MPV: 13.3 fL — ABNORMAL HIGH (ref 9.4–12.3)
Monocytes %: 10.2 % (ref 4.0–12.0)
Monocytes Absolute: 0.83 10*3/uL (ref 0.10–1.30)
Neutrophils %: 59.7 % (ref 43.0–78.0)
Neutrophils Absolute: 4.86 10*3/uL (ref 1.70–8.20)
Platelets: 72 10*3/uL — AB (ref 150–450)
RBC: 5.03 M/uL (ref 4.23–5.6)
RDW: 14.9 % — ABNORMAL HIGH (ref 11.9–14.6)
WBC: 8.2 10*3/uL (ref 4.3–11.1)
nRBC: 0 10*3/uL (ref 0.0–0.2)

## 2023-04-08 ENCOUNTER — Ambulatory Visit: Admit: 2023-04-08 | Payer: MEDICARE | Attending: Internal Medicine | Primary: Family Medicine

## 2023-04-08 VITALS — BP 147/88 | HR 78 | Temp 97.50000°F | Resp 16 | Ht 73.0 in | Wt 297.5 lb

## 2023-04-08 DIAGNOSIS — Z86718 Personal history of other venous thrombosis and embolism: Secondary | ICD-10-CM

## 2023-04-08 MED ORDER — XARELTO 10 MG PO TABS
10 MG | ORAL_TABLET | Freq: Every day | ORAL | 1 refills | Status: DC
Start: 2023-04-08 — End: 2023-04-19

## 2023-04-08 NOTE — Progress Notes (Signed)
[facility]: Office Visit Established Patient    Reason for follow up:    H/o DVT  thrombocytopenia      Oncologic overview: per [facility] cancer institute visit dated 07/07/19:    Secondary erythrocytosis   Hx of hemoglobin as high as 19   JAK2 negative; splenomegaly - mild   Bone marrow consistent with secondary erythrocytosis 7/16; JAK2, CALr, MPL on marrow negative    Splenomegaly with thrombocytopenia   Negative bone marrow 8/16   Hepatitis panel pending 12/16    DVT RLE 7/17   There was not enough blood to do the complete antiphospholipid testing. The lupus anticoagulant was positive which may be artifactual related to the presence of xarelto. Both factor V Leiden and the prothrombin gene mutation were absent        DIAGNOSIS     "BONE MARROW CORE BIOPSY, CLOT SECTION, ASPIRATE AND PERIPHERAL BLOOD   SMEAR:   - MILDLY HYPERCELLULAR MARROW WITH ERYTHROID HYPERPLASIA; SEE NOTE   - NO SIGNIFICANT MARROW FIBROSIS IDENTIFIED ON RETICULIN STAIN     NOTE: LABORATORY STUDIES ARE NOTABLE FOR AN ELEVATED ERYTHROPOIETIN   (28.2 MIU/ML, REF 2.6-18.5), AND NEGATIVE FOR MUTATIONS OF JAK2 V617F   ([facility]), JAK2 EXONS 12-14, CALRETICULIN, AND MPL. OVERALL, THE   FEATURES ARE MOST IN KEEPING WITH SECONDARY ERYTHROCYTOSIS.     THE CASE WAS DISCUSSED WITH DR. HINES AT 9:56 AM, 11/29/2014. 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     In June 2022, patient relocated to [place] from [place] to be closer to her children.  He has establish care with PCP and cardiology already.  He screening colonoscopy was  completed by Dr. Lajean Saver on 11/29/2020 with history of colon polyps.     Patient's presents today to discuss management of chronic anticoagulation.  He has been on anticoagulation for right lower extremity DVT diagnosed in July 2017.  He continues to take Xarelto 10 mg daily.  Based on history of coronary artery disease, he also takes aspirin 81 mg oral daily.     On evaluation today, he denies lower extremity pain or persistent swelling.  Right leg swells after a long drive.  He denies chest pain, unusual shortness of breath, nausea, vomiting, gum bleeds/nosebleeds/bloody stool/hematuria, abdominal pain, diarrhea or dysuria.  Denies fevers, drenching night sweats, early satiety, unintentional weight loss.  No falls, head trauma.     C/o mild macular rash on the dorsal aspect of left foot.  Also has chronic neuropathy involving bilateral feet-unchanged"      On 06/07/2021 he underwent PTV/PopV recanalization with PA (Dr. Wendi Maya).     Interval history:  History of Present Illness  The patient is a 68 year old male who presents for a follow-up regarding anticoagulation management for thrombocytopenia and a history of deep vein thrombosis (DVT).    He reports no clotting issues in his extremities. He notes an increase in leg size, although without associated pain. He experiences weakness in his legs, making walking challenging and unenjoyable, requiring him to focus on preventing tripping. He has no scheduled follow-up with Dr. 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 He recalls a consultation with Dr. Jean Rosenthal (an independent vascular surgeon) in [place], who suggested potential interventions for vein dilation but was awaiting Dr. Marylee Floras input. He believes his condition is hereditary, as both his father and grandfather experienced similar issues.    He underwent right shoulder revision arthroplasty on 01/30/2023, which subsequently  developed a joint space infection. He received intravenous antibiotics and has been on oral antibiotics since then, with the treatment expected to continue until the end of the month. He discontinued Xarelto 3 to 4 days prior to the operation and resumed it approximately 2 weeks ago.    He is also under the care of a gastroenterologist for hepatosplenomegaly. He has a history of alcohol abuse until his 53s. He had an ultrasound elastography of his liver but has not yet discussed the results with his gastroenterologist. He has a virtual appointment scheduled with her next week. He recalls a physician in 2007 inquiring about his liver health, suggesting he may have had this issue for some time.    SOCIAL HISTORY  The patient has a history of alcohol abuse until his 19s.    FAMILY HISTORY  The patient reports that his father spent the last 3 years of his life in a wheelchair due to severe leg pain from clots. The patient also mentions that his grandfather had similar issues, suggesting a hereditary component.          Review of Systems:  14 point ROS was negative except as per HPI      ECOG PERFORMANCE STATUS - 0-Fully active, able to carry on all pre-disease performance without restriction.    Pain - /10. None/Minimal pain - not affecting QOL     Fatigue - Failed to redirect to the Timeline version of the REVFS SmartLink.       Reviewed and updated this visit by provider:  Tobacco  Allergies  Meds  Problems  Med Hx  Surg Hx  Fam Hx          Current Outpatient Medications   Medication Sig Dispense Refill    zolpidem (AMBIEN) 5 MG tablet TAKE 1 TABLET BY MOUTH EVERY NIGHT AS NEEDED FOR SLEEP. MAX DAILY AMOUNT: 5 MG 90 tablet 0    Cyanocobalamin (VITAMIN B 12 PO) Take by mouth every morning      allopurinol (ZYLOPRIM) 300 MG tablet TAKE 1 TABLET BY MOUTH DAILY 90 tablet 3    rivaroxaban (XARELTO) 10 MG TABS tablet Take 1 tablet by mouth daily (with breakfast) 90 tablet 1    lisinopril (PRINIVIL;ZESTRIL) 30 MG  tablet Take 1 tablet by mouth daily 30 tablet 1    gabapentin (NEURONTIN) 600 MG tablet Take 1 tablet by mouth in the morning, at noon, in the evening, and at bedtime. 360 tablet 3    Multiple Vitamins-Minerals (THERAPEUTIC MULTIVITAMIN-MINERALS) tablet Take 1 tablet by mouth daily      vitamin D3 (CHOLECALCIFEROL) 10 MCG (400 UNIT) TABS tablet Take 2 tablets by mouth daily      CPAP Machine MISC by Does not apply route      colchicine (MITIGARE) 0.6 MG capsule Take 1 capsule by mouth as needed for Pain 30 capsule 3    Alpha-Lipoic Acid 200 MG CAPS Take 300 mg by mouth      fexofenadine (ALLEGRA) 180 MG tablet Take 1 tablet by mouth daily      pregabalin (LYRICA) 75 MG capsule Take 1  capsule by mouth 2 times daily for 180 days. start with 75mg  daily for 3-5 days, then increase to 75mg  twice a day for two weeks , then increase to 150mg  (2 cap) twice a day Max Daily Amount: 150 mg (Patient not taking: Reported on 01/16/2023) 180 capsule 1    DULoxetine (CYMBALTA) 30 MG extended release capsule Take 1 capsule by mouth daily (Patient not taking: Reported on 11/28/2022) 30 capsule 3     No current facility-administered medications for this visit.        OBJECTIVE:  BP (!) 147/88 (Site: Left Upper Arm, Position: Sitting)   Pulse 78   Temp 97.5 F (36.4 C) (Oral)   Resp 16   Ht 1.854 m (6\' 1" )   Wt 134.9 kg (297 lb 8 oz)   SpO2 98%   BMI 39.25 kg/m   Wt Readings from Last 3 Encounters:   04/08/23 134.9 kg (297 lb 8 oz)   03/11/23 134.3 kg (296 lb)   01/16/23 133.8 kg (295 lb)       Physical Exam:  Patient alert and oriented x 3, in no acute distress  Integumentary: No Pallor, no icterus  HEENT: moist mucous membranes, normal oropharynx  Lymph nodes: no cervical or axillary LAD  Cardiovascular:RRR, S1, S2 present, no m/r/g   Lungs: Clear to auscultation, no rales or wheezing, no accessory muscle use  Abdomen: Soft, and non-tender on palpation, no organomegaly, bowel sounds audible  Extremities: Mild bilateral leg  edema is noted.  No calf tenderness, Homans negative.  Neurological: patient can follow commands and move all extremities    Labs:  Lab Results   Component Value Date    WBC 8.2 04/03/2023    HGB 16.1 04/03/2023    HCT 48.0 04/03/2023    MCV 95.4 04/03/2023    PLT 72 (A) 04/03/2023     Lab Results   Component Value Date    NEUTROABS 4.86 04/03/2023    LYMPHOPCT 19.6 04/03/2023    LYMPHSABS 1.60 04/03/2023    MONOPCT 10.2 04/03/2023    MONOSABS 0.83 04/03/2023    EOSPCT 9.4 (H) 04/03/2023    EOSABS 0.77 04/03/2023    BASOPCT 0.7 04/03/2023     Lab Results   Component Value Date    NA 137 04/03/2023    K 4.8 04/03/2023    CL 102 04/03/2023    CO2 24 04/03/2023    BUN 21 04/03/2023    CREATININE 1.15 04/03/2023    GLUCOSE 107 (H) 04/03/2023    CALCIUM 9.3 04/03/2023    BILITOT 0.9 04/03/2023    ALKPHOS 63 04/03/2023    AST 44 (H) 04/03/2023    ALT 38 04/03/2023    LABGLOM 70 04/03/2023    GFRAA >60 12/18/2020    GLOB 3.5 04/03/2023     Date Obtained:   11/29/2020   DIAGNOSIS        A:  "ASCENDING COLON POLYPS":  FRAGMENTS OF MIXED TUBULOVILLOUS   ADENOMA.        B:  "TRANSVERSE COLON POLYP":  FRAGMENTS OF MIXED TUBULOVILLOUS   ADENOMA.           sms/12/01/2020     Sign Out Date: 12/01/2020  J. Tory Emerald., M.D.             Imaging:  XR THORACIC SPINE (3 VIEWS)    Result Date: 01/31/2021  1. Extensive postsurgical change without definite acute abnormality. 2. Fractured sternal wires seen within the upper sternum.  XR LUMBAR SPINE (MIN 4 VIEWS)    Result Date: 01/31/2021  1. Extensive postsurgical change without definite acute abnormality. 2. Fractured sternal wires seen within the upper sternum.    12/25/20: IMPRESSION:  No evidence of deep venous thrombosis in either lower extremity      07/24/21:IMPRESSION: US abdomen  -Mild hepatomegaly with diffusely increased hepatic echogenicity, likely  reflecting hepatic steatosis.  -Splenomegaly. Calcifications within the spleen, possibly reflecting sequelae of  prior  granulomatous disease.    Problems:  1. History of DVT (deep vein thrombosis), right lower extremity   -Long-term anticoagulation with Xarelto  -Chronic mild thrombocytopenia, without evidence of excessive bleeding-this has been worked up in the past.  He has had a bone marrow biopsy in 2017.    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  -Seasonal allergies  -Hypertension  -Sleep disturbance    PLAN:  I reviewed the results of most recent labs and imaging with the patient.  Platelet count is 61,000 and he has been switched to Eliquis 5 mg p.o. twice daily.  He is scheduled to undergo another procedure for management of superficial venous insufficiency of lower extremity in July.  Today again, I reviewed various possible causes of thrombocytopenia including but not limited to nutritional deficiencies, primary bone marrow conditions, ITP and chronic liver disease etc.  He will be referred to gastroenterology for evaluation of chronic liver disease as a possible contributor to thrombocytopenia.  His platelet counts should be closely monitored while on anticoagulation.  I have therefore ordered CBC to be checked every 6 weeks.  Anticoagulation should be held for platelet count less than 50,000 or evidence of active bleeding.  I shall plan to see Chad Rosario back in about 3 months.  I have discussed the role of repeat bone marrow biopsy and further work-up should his platelet count decline further/other cytopenias develop.  -11/27/21: I reviewed the results of most recent labs with the patient.  Erythrocytosis is again noted.  Platelet count is 72,000.  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  He will continue taking aspirin 81 mg EC daily and Eliquis 5 mg p.o. twice daily.  Follow-up with [facility] as scheduled.  Encouraged weight loss.     05/28/22: Chad Rosario returns today for six month follow up. He has been okay overall. He had recent left knee  surgery for which he is recovering. 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 He is seeing vascular soon.  He denies chest pain, dizziness, nausea, vomiting, fever, black or bloody stools, spontaneous bleeding.  He has been tolerating Eliquis 5 mg BID but has become financially toxic so he will be switching back to Xarelto soon.  He is established with GI for management of fatty liver/NASH. Labs reviewed. Hgb remains mildly elevated but stable. Platetlets 64,000 which is low but stable from September/November results. He does see his PCP regularly. We discussed needing to hold his anticoagulation if his platelets drop below 50,000. Most likely cause of thrombocytopenia is liver disease - encouraged weight loss and avoidance of liver toxic substances like Tylenol/alcohol. Follow up in 6 months or sooner if needed.      11/28/2022: Patient returns for follow-up evaluation and review of labs and imaging.  He has switched from Eliquis to Xarelto and has been taking the medication compliantly at a dose of 20 mg p.o.  daily.  In last few weeks, he has noted increased bruising but denies spontaneous bleeding.  He denies any pain in his legs.  He was evaluated by Dr. Wendi Maya in March.  Venous ultrasound RLE noted occluded femoral vein, it was open a year ago.  Due to chronically occluded right popliteal vein, access to the occluded femoral vein would be challenging.  Any intervention has therefore been deferred until a point when he develops worsening symptoms (swelling, pain, discoloration).  There is no evidence of such on today's exam.  His platelet count has been trending down slowly and is 58,000 on today's labs.  In view of increased bruising, we shall reduce the dose of Xarelto to 10 mg p.o. daily.  Patient acknowledged that the dose may need to be increased back to 20 mg daily, should he develop any  acute thrombotic event.  Likely etiology for thrombocytopenia is liver disease/splenomegaly.  Patient reports a history of binge drinking until his 16s.  Cautioned him against further alcohol consumption.  Advised avoidance of acetaminophen.  We shall obtain ultrasound liver/spleen for further evaluation and likely refer him back to GI for further evaluation.  Return office visit in 3 months or sooner if needed with labs and imaging as above.    04/09/23:   Assessment & Plan  1. Thrombocytopenia.  His platelet count remains stable at 72,000, consistent with historical values over the past 2.5 years. The observed increase in platelet count is likely attributable to the stress and inflammation associated with his recent surgery, rather than discontinuation of Xarelto. His hemoglobin levels are within normal range, indicating minimal blood loss during the procedure. Liver function tests are satisfactory, with a persistent mild abnormality in AST. He reports no symptoms such as fever, night sweats, or excessive bleeding while on Xarelto. A note will be forwarded to his primary care physician, Dr. Ardell Isaacs, indicating that his platelet count has remained stable. He can continue to prescribe Xarelto 10 mg, which has not resulted in excessive bleeding. If his platelet count falls below 50,000, further investigation into potential bone marrow processes will be warranted.    2. Chronic lower extremity DVT.  He reports no new clotting issues in the arms or legs. Swelling persists but is not associated with pain. 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 Baldomero Lamy could not perform further interventions due to the occlusion. He was advised to consult with a vascular surgeon or for potential alternative treatment options.     3. Hepatosplenomegaly.  He has a history of alcohol use and fatty liver disease. Ultrasound elastography indicates mild fibrosis (F1) and an enlarged  spleen (16 cm) was noted on prior abdominal imaging. The spleen enlargement is likely contributing to the sequestration of white blood cells and platelets, affecting the observed counts. He was advised to avoid alcohol and certain toxins to prevent further liver damage. Continued monitoring of liver function and spleen size is recommended.    4. Post-operative infection.  He underwent right shoulder revision arthroplasty on 01/30/2023 and developed a joint space infection. He received i.v. antibiotics and has been on oral antibiotics since then. He will continue the oral antibiotics until the end of the month.    All questions were answered to the best of my abilities.   ROV PRN.  Total visit time 30 minutes.  Please forward a copy of this note to Dr. Ardell Isaacs.  Hector Shade, MD  [facility]  [address]  [place], [place] [address]  Office : [phone]  Fax : [phone]           Elements of this note have been dictated using speech recognition software. As a result, errors of speech recognition may have occurred.    The patient (or guardian, if applicable) and other individuals in attendance with the patient were advised that Artificial Intelligence will be utilized during this visit to record, process the conversation to generate a clinical note, and support improvement of the AI technology. The patient (or guardian, if applicable) and other individuals in attendance at the appointment consented to the use of AI, including the recording.

## 2023-04-08 NOTE — Patient Instructions (Addendum)
Patient Information from Today's Visit    Diagnosis: DVT/Thrombocytopenia      Follow Up Instructions:   - Labs reviewed  - Discussed ultrasound of the abdomen; briefly discussed the results of ultrasound elastography. Keep follow up as scheduled with gastroenterology to discuss results in further detail.  - Low platelets likely related to fatty liver and enlarged spleen; can continue to be monitored by primary care.  - Continue taking Xarelto as prescribed; follow up with primary care for future refills on this mediation.    Follow up as needed    Treatment Summary has been discussed and given to patient: N/A      Current Labs:   No visits with results within 3 Day(s) from this visit.   Latest known visit with results is:   Orders Only on 04/03/2023   Component Date Value Ref Range Status    WBC 04/03/2023 8.2  4.3 - 11.1 K/uL Final    RBC 04/03/2023 5.03  4.23 - 5.6 M/uL Final    Hemoglobin 04/03/2023 16.1  13.6 - 17.2 g/dL Final    Hematocrit 09/81/1914 48.0  41.1 - 50.3 % Final    MCV 04/03/2023 95.4  82 - 102 FL Final    MCH 04/03/2023 32.0  26.1 - 32.9 PG Final    MCHC 04/03/2023 33.5  31.4 - 35.0 g/dL Final    RDW 78/29/5621 14.9 (H)  11.9 - 14.6 % Final    Platelets 04/03/2023 72 (A)  150 - 450 K/uL Final    MPV 04/03/2023 13.3 (H)  9.4 - 12.3 FL Final    nRBC 04/03/2023 0.00  0.0 - 0.2 K/uL Final    **Note: Absolute NRBC parameter is now reported with Hemogram**    Differential Type 04/03/2023 AUTOMATED    Final    Neutrophils % 04/03/2023 59.7  43.0 - 78.0 % Final    Lymphocytes % 04/03/2023 19.6  13.0 - 44.0 % Final    Monocytes % 04/03/2023 10.2  4.0 - 12.0 % Final    Eosinophils % 04/03/2023 9.4 (H)  0.5 - 7.8 % Final    Basophils % 04/03/2023 0.7  0.0 - 2.0 % Final    Immature Granulocytes % 04/03/2023 0.4  0.0 - 5.0 % Final    Neutrophils Absolute 04/03/2023 4.86  1.70 - 8.20 K/UL Final    Lymphocytes Absolute 04/03/2023 1.60  0.50 - 4.60 K/UL Final    Monocytes Absolute 04/03/2023 0.83  0.10 - 1.30  K/UL Final    Eosinophils Absolute 04/03/2023 0.77  0.00 - 0.80 K/UL Final    Basophils Absolute 04/03/2023 0.06  0.00 - 0.20 K/UL Final    Immature Granulocytes Absolute 04/03/2023 0.03  0.0 - 0.5 K/UL Final    Sodium 04/03/2023 137  136 - 145 mmol/L Final    Potassium 04/03/2023 4.8  3.5 - 5.1 mmol/L Final    Chloride 04/03/2023 102  98 - 107 mmol/L Final    CO2 04/03/2023 24  20 - 29 mmol/L Final    Anion Gap 04/03/2023 11  7 - 16 mmol/L Final    Glucose 04/03/2023 107 (H)  70 - 99 mg/dL Final    Comment: <30 mg/dL Consistent with, but not fully diagnostic of hypoglycemia.  100 - 125 mg/dL Impaired fasting glucose/consistent with pre-diabetes mellitus.  > 126 mg/dl Fasting glucose consistent with overt diabetes mellitus      BUN 04/03/2023 21  8 - 23 MG/DL Final    Creatinine 86/57/8469 1.15  0.80 -  1.30 MG/DL Final    Est, Glom Filt Rate 04/03/2023 70  >60 ml/min/1.14m2 Final    Comment:   Pediatric calculator link: https://www.kidney.org/professionals/kdoqi/gfr_calculatorped    These results are not intended for use in patients <33 years of age.    eGFR results are calculated without a race factor using  the 2021 CKD-EPI equation. Careful clinical correlation is recommended, particularly when comparing to results calculated using previous equations.  The CKD-EPI equation is less accurate in patients with extremes of muscle mass, extra-renal metabolism of creatinine, excessive creatine ingestion, or following therapy that affects renal tubular secretion.      Calcium 04/03/2023 9.3  8.8 - 10.2 MG/DL Final    Total Bilirubin 04/03/2023 0.9  0.0 - 1.2 MG/DL Final    ALT 16/12/9602 38  8 - 55 U/L Final    AST 04/03/2023 44 (H)  15 - 37 U/L Final    Alk Phosphatase 04/03/2023 63  40 - 129 U/L Final    Total Protein 04/03/2023 7.2  6.3 - 8.2 g/dL Final    Albumin 54/11/8117 3.7  3.2 - 4.6 g/dL Final    Globulin 14/78/2956 3.5  2.3 - 3.5 g/dL Final    Albumin/Globulin Ratio 04/03/2023 1.0  1.0 - 1.9   Final                  Please refer to After Visit Summary or MyChart for upcoming appointment information. If you have any questions regarding your upcoming schedule please reach out to your care team through MyChart or call [phone].    Please notify your assigned Nurse Navigator of any unplanned hospital admissions or Emergency Room visits within 24 hours of discharge.    -------------------------------------------------------------------------------------------------------------------  Please call our office at [phone] if you have any  of the following symptoms:   Fever of 100.5 or greater  Chills  Shortness of breath  Swelling or pain in one leg    After office hours an answering service is available and will contact a provider for emergencies or if you are experiencing any of the above symptoms.        Orbie Pyo, RN

## 2023-04-09 ENCOUNTER — Telehealth

## 2023-04-09 NOTE — Telephone Encounter (Signed)
Patient called today concerned about difficulty walking due to feeling his legs are weak as well as chronic swelling worse in the right leg.  Known hx of right leg DVT.  He is worried about poor circulation in his legs.  He has not previously had a lower extremity arterial workup.  We will obtain both arterial and venous ultrasounds of the legs and schedule office follow up to discuss results.  He is taking Xarelto for his chronic DVT which is managed by Dr. Karie Mainland.

## 2023-04-10 ENCOUNTER — Telehealth

## 2023-04-10 NOTE — Telephone Encounter (Signed)
Pt left a message in regards to the Mychart message he sent on the 9th about worsening neuropathy.

## 2023-04-10 NOTE — Telephone Encounter (Signed)
Spoke to the patient over the phone who informed me that he has been experiencing worsening of lower extremity weakness mostly has difficulty getting out of the chair, climbing stairs.  He also was experiencing worsening of his neuropathy which he describes as worsening of bilateral feet numbness.    Has a previous history of back surgeries.  He does have appointment with spinal neurosurgeon in [place] but it is not happening until May 2025.  I discussed with the patient that he might benefit from seeing spine surgeons sooner.  I will refer him to one of my colleagues here at [facility].

## 2023-04-15 ENCOUNTER — Encounter

## 2023-04-16 NOTE — Progress Notes (Signed)
 [facility] [address] ORTHO/NEURO  [facility]  [address]  [place] [facility] [address]  [phone]  Date of Encounter:  04/16/2023    PATIENT INFORMATION:  Name: Chad Rosario  Age:  68 y.o.  Sex:  male  DOB:  05-26-1955    MRN:  [id number]  Cell Phone:    Telephone Information:   Mobile [phone]     PCP:  [name], MD  Insurance:  Payor: MEDICARE / Plan: MEDICARE PART A & B / Product Type: *No Product type* /     CHIEF COMPLAINT:    Chief Complaint   Patient presents with   . Right Shoulder - Post-op     Patient states he is doing better. I&D/Revision 03/11/23.     03/11/23 I&D Revision right reverse total shoulder arthroplasty     HPI:   The patient is 10 weeks after a right reverse shoulder arthroplasty.  He developed Staph epi infection. He was treated by infectious disease with 2 infusions. He is currently on oral doxycycline for another week.         PAST MEDICAL HISTORY:  Past Medical History:   Diagnosis Date   . Chronic venous insufficiency    . Coronary artery disease    . Dvt femoral (deep venous thrombosis) (HCC)    . Fatty liver    . HLD (hyperlipidemia)    . Hypertension    . Sleep apnea          PAST SURGICAL HISTORY:  Past Surgical History:   Procedure Laterality Date   . BACK SURGERY      fusion   . CARPAL TUNNEL RELEASE Bilateral    . CHOLECYSTECTOMY     . CORONARY ARTERY BYPASS GRAFT  2015    with stent.   Marland Kitchen KNEE ARTHROSCOPY Bilateral    . LEG SURGERY     . REVERSE TOTAL SHOULDER ARTHROPLASTY Right 01/30/2023    Julious Payer MD   . SHOULDER ARTHROSCOPY Bilateral    . SHOULDER SURGERY Right 03/11/2023    INCISION & DRAINAGE, SHOULDER AREA; DEEP ABSCESS OR HEMATOMA *RIGHT SHOULDER INCISION AND DRAINAGE, POSSIBLE REVISION OF RIGHT REVERSE TOTAL SHOULDER ARTHROPLASTY SJT   . TOTAL HIP ARTHROPLASTY Left        FAMILY HISTORY:  No family history on file.    SOCIAL HISTORY:  Social History     Socioeconomic History    . Marital status: Married   Tobacco Use   . Smoking status: Never   . Smokeless tobacco: Never   Vaping Use   . Vaping status: Never Used   Substance and Sexual Activity   . Alcohol use: Yes     Alcohol/week: 10.0 standard drinks of alcohol     Types: 2 Glasses of wine, 4 Cans of beer, 4 Servings of liquor per week     Comment: social   . Drug use: Never       MEDICATIONS:  Current Outpatient Medications   Medication Sig Dispense Refill   . acetaminophen (TYLENOL) 500 mg Take 1,000 mg by mouth every 6 (six) hours as needed     . allopurinoL (ZYLOPRIM) 300 mg tablet Take by mouth every morning     . alpha lipoic acid 200 mg Take 300 mg     . amitriptyline (ELAVIL) 10 mg tablet at bedtime as needed (Patient not taking: Reported on 03/11/2023)     . ascorbic acid (VITAMIN  C ORAL) Take by mouth every morning     . aspirin (ECOTRIN) 81 mg EC tablet Take 1 tablet (81 mg) by mouth daily Start taking day of surgery and then stop once taking Xarelto again. 30 tablet 0   . azelastine (ASTELIN) 137 mcg (0.1 %) nasal spray 1 spray into each nostril daily as needed     . CPAP Take by mouth nightly Use as instructed     . cyanocobalamin, vitamin B-12, (VITAMIN B12 ORAL) Take by mouth every morning     . docusate sodium (COLACE) 100 mg capsule Take 1 capsule (100 mg) by mouth 2 (two) times a day as needed for constipation for up to 10 days 20 capsule 0   . doxycycline (VIBRAMYCIN) 100 mg capsule Take 1 capsule (100 mg) by mouth 2 (two) times a day 60 capsule 0   . ergocalciferol (DRISDOL) 1,250 mcg (50,000 unit) capsule Take by mouth daily     . fexofenadine (ALLEGRA) 60 mg tablet Take 60 mg by mouth every morning     . gabapentin (NEURONTIN) 300 mg capsule Take 1 capsule (300 mg) by mouth 3 (three) times a day 90 capsule 0   . lisinopriL (ZESTRIL) 20 mg tablet Take 20 mg by mouth every morning     . naloxone (NARCAN) 4 mg/actuation nasal spray Place 1 spray (4 mg) into one nostril once as needed for opioid overdose for up to 1  dose 1 each 0   . ondansetron (ZOFRAN ODT) 4 mg disintegrating tablet Take 1 tablet (4 mg) by mouth 3 (three) times a day as needed for nausea or vomiting for up to 10 days 30 tablet 0   . pyridoxine HCl, vitamin B6, (VITAMIN B-6 ORAL) Take by mouth every morning     . rivaroxaban (XARELTO) tablet Take 1 tablet (20 mg) by mouth every morning DO NOT start until Saturday 03/16/23 then take as previously prescribed. 30 tablet 0   . senna (SENOKOT) 8.6 mg tablet Take 2 tablets by mouth at bedtime as needed for constipation for up to 10 days 20 tablet 0   . zolpidem (AMBIEN) 10 mg tablet Take 10 mg by mouth at bedtime as needed       No current facility-administered medications for this visit.       ALLERGIES:  Hydrocodone-acetaminophen, Losartan, Amlodipine, Rosuvastatin, Atorvastatin, and Ezetimibe      PHYSICAL EXAMINATION:  General: Well developed, well-nourished and in no acute distress.  HEENT:  Head atraumatic/normocephalic.  No oropharyngeal lesions.  Neck: Trachea midline, no thyromegaly, no JVD.  Eyes:  Anicteric sclerae, moist conjunctivae.  No lid-lag.  Extraocular muscles intact.   Psychiatric: Well oriented to person, place and time.  Demonstrates normal mood and affect.  Cardiovascular: Regular rhythm by palpation of distal pulse, normal color and temperature, no concerning varicosities on symptomatic side  Pulmonary: Breathing comfortably.  Normal and symmetric chest expansion.  No intercostal retractions.  Skin: No rashes, lesions or ulcers.  Normal temperature, turgor, and texture on uninvolved extremity.  Musculoskeletal:  His incision is healed. There is no significant erythema. No fluctuance. Active forward elevation is 150.  Active external rotation is 50.  Internal rotation is to his side.           IMAGING:       ASSESSMENT(S):  1. Right shoulder pain, unspecified chronicity          TREATMENT:    Plan:  We are going to have him follow back up  with infectious disease, continue physical therapy.  SJT/jlm           Procedure:  Procedures        Orders:  No orders of the defined types were placed in this encounter.      Rx:  Patient is to continue all medications as directed by prescribing physicians. Continuations on today's visit are made based on the patient's report of current medications      Follow Up:  Return in about 2 months (around 06/14/2023).    Notes: Patient is to continue all medications as directed by prescribing physicians. Continuations on today's visit are made based on the patient's report of current medications.     Current Meds Verified: Current meds/immunizations reviewed, including purpose with pt. Med Recon list given to pt/family. Pt advised to discard old med lists and provide all providers with current list at each visit and carry list with them in case of emergency    .I, as attending physician, participated in the care of this patient and was physically present for the key or critical components of the services provided.   Patient was seen and evaluated with our orthopedic resident/fellow. All imaging was reviewed by myself and discussed with patient. I agree with our assessment and plan going forward. All questions/concerns answered and prognosis discussed.        Julious Payer, MD       Current view: Showing all answers           Promis Cat V2.0 - Upper Extremity Function       Question 04/09/2023  1:16 PM EST - Filed by Patient 03/25/2023  9:36 AM EST - Filed by Patient 03/22/2023  9:43 AM EST - Filed by Patient    PROMIS Upper Extremity Function T-Score (range: 10 - 90) 43 (mild dysfunction) 29 (severe dysfunction)* 30 (moderate dysfunction)*          Promis Cat V1.1 - Pain Interference       Question 04/09/2023  1:16 PM EST - Filed by Patient 03/25/2023  9:36 AM EST - Filed by Patient 03/22/2023  9:43 AM EST - Filed by Patient    PROMIS Pain Interference T-Score (range: 10 - 90) (range: 10 - 90) 56 (mild) 60 (mild) 57 (mild)          Promis Cat V2.0 - Physical Function        Question 04/09/2023  1:17 PM EST - Filed by Patient 03/25/2023  9:37 AM EST - Filed by Patient 03/22/2023  9:44 AM EST - Filed by Patient    PROMIS Physical Function T-Score (range: 10 - 90) 41 (mild dysfunction) 38 (moderate dysfunction)* 38 (moderate dysfunction)*          Promis Cat V1.0 - Depression       Question 04/09/2023  1:17 PM EST - Filed by Patient 03/25/2023  9:37 AM EST - Filed by Patient 03/22/2023  9:44 AM EST - Filed by Patient    PROMIS Depression T-Score (range: 10 - 90) 51 (within normal limits) 50 (within normal limits) 51 (within normal limits)          Ph Col Ortho Sports Related Questionnaire       Question 04/09/2023  1:18 PM EST - Filed by Patient    Are you here today for a sports related injury? No          Promis Scale V1.2 - Global Health       Question 04/09/2023  1:19 PM  EST - Filed by Patient 03/25/2023  9:38 AM EST - Filed by Patient 03/22/2023  9:46 AM EST - Filed by Patient    Please respond to each question or statement.       In general, would you say your health is: Good Good Good    In general, would you say your quality of life is: Mcarthur Rossetti Fair    In general, how would you rate your physical health? Good Good Good    In general, how would you rate your mental health, including your mood and your ability to think? Good Very good Good    In general, how would you rate your satisfaction with your social activities and relationships? Good Fair Fair    In general, please rate how well you carry out your usual social activities and roles. (This includes activities at home, at work and in Paediatric nurse, and responsibilities as a parent, child, spouse, employee, friend, etc.) Good Good Fair    To what extent are you able to carry out your everyday physical activities such as walking, climbing stairs, carrying groceries, or moving a chair? Moderately Moderately Moderately    In the past 7 days       How often have you been bothered by emotional problems such as feeling anxious,  depressed or irritable? Sometimes Rarely Rarely    How would you rate your fatigue on average? Moderate Moderate Moderate    How would you rate your pain on average? 2 2 3     PROMIS Global Mental Health T-Score (range: 20 - 70) 41 (Fair)* 44 (Good) 41 (Fair)*    PROMIS Global Physical Health T-Score (range: 15 - 70) 42 (Fair)* 42 (Fair)* 42 (Fair)*    PROMIS General Health Score (range: 1 - 5) 3 3 3     PROMIS Global Social Activities & Roles Score (range: 1 - 5) 3 3 2           *Some images could not be shown.

## 2023-04-17 ENCOUNTER — Observation Stay: Admit: 2023-04-17 | Payer: MEDICARE | Primary: Family Medicine

## 2023-04-17 ENCOUNTER — Observation Stay: Admit: 2023-04-17 | Discharge: 2023-04-24 | Payer: MEDICARE | Primary: Family Medicine

## 2023-04-17 ENCOUNTER — Inpatient Hospital Stay
Admit: 2023-04-17 | Discharge: 2023-04-19 | Disposition: A | Discharge status: Home / Self Care | Payer: MEDICARE | Source: Ambulatory Visit | Attending: Cardiovascular Disease | Admitting: Cardiovascular Disease

## 2023-04-17 ENCOUNTER — Ambulatory Visit
Admit: 2023-04-17 | Discharge: 2023-04-17 | Payer: MEDICARE | Attending: Cardiovascular Disease | Primary: Family Medicine

## 2023-04-17 VITALS — BP 142/80 | HR 88 | Ht 73.0 in | Wt 299.0 lb

## 2023-04-17 DIAGNOSIS — I509 Heart failure, unspecified: Secondary | ICD-10-CM

## 2023-04-17 DIAGNOSIS — I4891 Unspecified atrial fibrillation: Secondary | ICD-10-CM

## 2023-04-17 DIAGNOSIS — R0609 Other forms of dyspnea: Secondary | ICD-10-CM

## 2023-04-17 DIAGNOSIS — I110 Hypertensive heart disease with heart failure: Secondary | ICD-10-CM

## 2023-04-17 LAB — CBC
Hematocrit: 48.5 % (ref 41.1–50.3)
Hemoglobin: 16.4 g/dL (ref 13.6–17.2)
MCH: 31.5 pg (ref 26.1–32.9)
MCHC: 33.8 g/dL (ref 31.4–35.0)
MCV: 93.1 fL (ref 82–102)
MPV: 12.5 fL — ABNORMAL HIGH (ref 9.4–12.3)
Platelets: 109 10*3/uL — ABNORMAL LOW (ref 150–450)
RBC: 5.21 M/uL (ref 4.23–5.6)
RDW: 14.1 % (ref 11.9–14.6)
WBC: 9.6 10*3/uL (ref 4.3–11.1)
nRBC: 0 10*3/uL (ref 0.0–0.2)

## 2023-04-17 LAB — TSH REFLEX TO FT4: TSH w Free Thyroid if Abnormal: 2.64 u[IU]/mL (ref 0.27–4.20)

## 2023-04-17 LAB — COMPREHENSIVE METABOLIC PANEL
ALT: 43 U/L (ref 8–55)
AST: 48 U/L — ABNORMAL HIGH (ref 15–37)
Albumin/Globulin Ratio: 0.9 — ABNORMAL LOW (ref 1.0–1.9)
Albumin: 3.6 g/dL (ref 3.2–4.6)
Alk Phosphatase: 57 U/L (ref 40–129)
Anion Gap: 11 mmol/L (ref 7–16)
BUN: 21 mg/dL (ref 8–23)
CO2: 23 mmol/L (ref 20–29)
Calcium: 9.3 mg/dL (ref 8.8–10.2)
Chloride: 103 mmol/L (ref 98–107)
Creatinine: 1.12 mg/dL (ref 0.80–1.30)
Est, Glom Filt Rate: 72 mL/min/{1.73_m2} (ref >60)
Globulin: 3.8 g/dL — ABNORMAL HIGH (ref 2.3–3.5)
Glucose: 109 mg/dL — ABNORMAL HIGH (ref 70–99)
Potassium: 4.6 mmol/L (ref 3.5–5.1)
Sodium: 137 mmol/L (ref 136–145)
Total Bilirubin: 0.7 mg/dL (ref 0.0–1.2)
Total Protein: 7.3 g/dL (ref 6.3–8.2)

## 2023-04-17 LAB — BRAIN NATRIURETIC PEPTIDE: NT Pro-BNP: 901 pg/mL — ABNORMAL HIGH (ref 0–125)

## 2023-04-17 MED ORDER — CETIRIZINE HCL 10 MG PO TABS
10 MG | Freq: Every day | ORAL | Status: AC
Start: 2023-04-17 — End: 2023-04-19
  Administered 2023-04-18 – 2023-04-19 (×2): 10 mg via ORAL

## 2023-04-17 MED ORDER — NORMAL SALINE FLUSH 0.9 % IV SOLN
0.9 % | Freq: Two times a day (BID) | INTRAVENOUS | Status: AC
Start: 2023-04-17 — End: 2023-04-19
  Administered 2023-04-18 – 2023-04-19 (×4): 10 mL via INTRAVENOUS

## 2023-04-17 MED ORDER — ZOLPIDEM TARTRATE 5 MG PO TABS
5 MG | Freq: Every evening | ORAL | Status: AC | PRN
Start: 2023-04-17 — End: 2023-04-19
  Administered 2023-04-18 – 2023-04-19 (×2): 5 mg via ORAL

## 2023-04-17 MED ORDER — ONDANSETRON 4 MG PO TBDP
4 MG | Freq: Three times a day (TID) | ORAL | Status: AC | PRN
Start: 2023-04-17 — End: 2023-04-19

## 2023-04-17 MED ORDER — SODIUM CHLORIDE 0.9 % IV SOLN
0.9 % | INTRAVENOUS | Status: AC | PRN
Start: 2023-04-17 — End: 2023-04-19

## 2023-04-17 MED ORDER — ONDANSETRON HCL 4 MG/2ML IJ SOLN
42 MG/2ML | Freq: Four times a day (QID) | INTRAMUSCULAR | Status: AC | PRN
Start: 2023-04-17 — End: 2023-04-19

## 2023-04-17 MED ORDER — NORMAL SALINE FLUSH 0.9 % IV SOLN
0.9 % | INTRAVENOUS | Status: AC | PRN
Start: 2023-04-17 — End: 2023-04-19

## 2023-04-17 MED ORDER — MAGNESIUM SULFATE 2000 MG/50 ML IVPB PREMIX
250 GM/50ML | INTRAVENOUS | Status: AC | PRN
Start: 2023-04-17 — End: 2023-04-19

## 2023-04-17 MED ORDER — DILTIAZEM HCL 25 MG/5ML IV SOLN
25 | Freq: Once | INTRAVENOUS | Status: AC
Start: 2023-04-17 — End: 2023-04-17
  Administered 2023-04-17: 10 mg via INTRAVENOUS

## 2023-04-17 MED ORDER — ALLOPURINOL 300 MG PO TABS
300 MG | Freq: Every day | ORAL | Status: AC
Start: 2023-04-17 — End: 2023-04-19
  Administered 2023-04-18 – 2023-04-19 (×2): 300 mg via ORAL

## 2023-04-17 MED ORDER — FUROSEMIDE 10 MG/ML IJ SOLN
10 | Freq: Two times a day (BID) | INTRAMUSCULAR | Status: DC
Start: 2023-04-17 — End: 2023-04-18
  Administered 2023-04-17 – 2023-04-18 (×2): 40 mg via INTRAVENOUS

## 2023-04-17 MED ORDER — LISINOPRIL 5 MG PO TABS
5 | Freq: Every day | ORAL | Status: DC
Start: 2023-04-17 — End: 2023-04-18
  Administered 2023-04-18: 14:00:00 30 mg via ORAL

## 2023-04-17 MED ORDER — GABAPENTIN 300 MG PO CAPS
300 | Freq: Four times a day (QID) | ORAL | Status: DC
Start: 2023-04-17 — End: 2023-04-18
  Administered 2023-04-17 – 2023-04-18 (×4): 600 mg via ORAL

## 2023-04-17 MED ORDER — SODIUM CHLORIDE 0.9 % IV SOLN (ADD-VANTAGE)
0.9 | INTRAVENOUS | Status: DC
Start: 2023-04-17 — End: 2023-04-18
  Administered 2023-04-17: 5 mg/h via INTRAVENOUS
  Administered 2023-04-18: 12:00:00 12.5 mg/h via INTRAVENOUS

## 2023-04-17 MED ORDER — NITROGLYCERIN 0.4 MG SL SUBL
0.4 MG | SUBLINGUAL | Status: AC | PRN
Start: 2023-04-17 — End: 2023-04-19

## 2023-04-17 MED ORDER — POLYETHYLENE GLYCOL 3350 17 G PO PACK
17 g | Freq: Every day | ORAL | Status: AC | PRN
Start: 2023-04-17 — End: 2023-04-19

## 2023-04-17 MED ORDER — RIVAROXABAN 20 MG PO TABS
20 | Freq: Every day | ORAL | Status: DC
Start: 2023-04-17 — End: 2023-04-18

## 2023-04-17 MED ORDER — DOXYCYCLINE HYCLATE 100 MG PO CAPS
100 MG | Freq: Two times a day (BID) | ORAL | Status: AC
Start: 2023-04-17 — End: 2023-04-24
  Administered 2023-04-18 – 2023-04-19 (×4): 100 mg via ORAL

## 2023-04-17 MED FILL — DILTIAZEM HCL 100 MG IV SOLR: 100 MG | INTRAVENOUS | Qty: 1

## 2023-04-17 MED FILL — XARELTO 20 MG PO TABS: 20 MG | ORAL | Qty: 1

## 2023-04-17 MED FILL — GABAPENTIN 300 MG PO CAPS: 300 MG | ORAL | Qty: 2

## 2023-04-17 MED FILL — DILTIAZEM HCL 25 MG/5ML IV SOLN: 25 MG/5ML | INTRAVENOUS | Qty: 5

## 2023-04-17 MED FILL — FUROSEMIDE 10 MG/ML IJ SOLN: 10 MG/ML | INTRAMUSCULAR | Qty: 4

## 2023-04-17 NOTE — Consults (Signed)
US Guided PIV access-    Ultrasound was used to find the vein which was compressible and without any ultrasound features of an artery or nerve bundle. Skin was cleaned and disinfected prior to IV puncture.  Under real-time ultrasound guidance peripheral access was obtained in the right cephalic using 20 G 1.75" Peripheral IV catheter after 1 attempt(s). Blood return was present and IV flushed without difficulty with no clinical signs of infiltration. IV dressing applied and no immediate complications noted. Patient tolerated the procedure well.

## 2023-04-17 NOTE — Progress Notes (Signed)
Pt has arrived to floor as a direct admit from the office. Vitals and assessment have been completed. No questions/concerns at this time.

## 2023-04-17 NOTE — Progress Notes (Signed)
[facility] Follow Up                 Reason for Visit: DOE    Subjective:     Patient is a 68 y.o. male with a PMH of CAD status post CABG in 2015, statin intolerance, hypertension, hyperlipidemia, secondary erythrocytosis, chronic venous insufficiency and DVT who presents for follow-up.  The patient was last seen in April 2024.  Aspirin was discontinued in order to mitigate bleeding risk.  The patient had an echocardiogram in October 2020 that was noted to demonstrate a normal EF.  The patient reports DOE walking in from the parking lot today.  He says that he has had DOE for a couple months.  He also reports wheezing that comes and goes.  The patient reports lower extremity peripheral edema that is weeping for the last couple months as well.  He denies angina and frank palpitations.  He does feel an ill-defined sensation in the chest when asked if he has palpitations.  He reports abdominal bloating.    Past Medical History:   Diagnosis Date    CAD (coronary artery disease) 2015    Gout     Hypertension     controlled with meds    Neuropathy May 2019    Osteoarthritis 03/2013    Sleep apnea     cpap at night      Past Surgical History:   Procedure Laterality Date    BACK SURGERY  2021    has had 3 different ones last was in 2021    CARDIAC SURGERY      CABG    CHOLECYSTECTOMY      COLONOSCOPY N/A 11/29/2020    COLONOSCOPY POLYPECTOMY HOT SNARE performed by Drucie Opitz, MD at [facility] ENDOSCOPY    EYE SURGERY Left     cataract    FRACTURE SURGERY Left     tib-fib fracture    JOINT REPLACEMENT  Hip - 2018    KNEE ARTHROSCOPY Right     TOTAL HIP ARTHROPLASTY Left       Family History   Problem Relation Age of Onset    Cancer Mother         Lymp    Stroke Father     Cancer Sister         Breast    Clotting Disorder Maternal Grandfather       Social History     Tobacco Use    Smoking status: Never    Smokeless tobacco: Never   Substance Use Topics    Alcohol use: Yes     Alcohol/week: 12.0 standard drinks of alcohol      Types: 6 Cans of beer, 6 Shots of liquor per week     Comment: socially      Allergies   Allergen Reactions    Hydrocodone-Acetaminophen Anxiety and Itching    Losartan Shortness Of Breath    Amlodipine Other (See Comments)     Other reaction(s): Other (See Comments)  Fatigue   Fatigue       Rosuvastatin Other (See Comments)     Other reaction(s): Constipation  Fatigue, constipation  Other reaction(s): Fatigue    Atorvastatin Diarrhea    Ezetimibe      Other reaction(s): Constipation, Other (See Comments)  Constipation and decreased urine output  Constipation and decreased urine output  Other reaction(s): Constipation, Urine output low           ROS:  No obvious pertinent positives  on review of systems except for what was outlined above.       Objective:       BP (!) 142/80   Pulse 88   Ht 1.854 m (6\' 1" )   Wt 135.6 kg (299 lb)   BMI 39.45 kg/m     BP Readings from Last 3 Encounters:   04/17/23 (!) 142/80   04/08/23 (!) 147/88   03/11/23 120/78       Wt Readings from Last 3 Encounters:   04/17/23 133.9 kg (295 lb 4.8 oz)   04/17/23 135.6 kg (299 lb)   04/08/23 134.9 kg (297 lb 8 oz)       General/Constitutional:   Alert and oriented x 3, no acute distress  HEENT:   normocephalic, atraumatic, moist mucous membranes  Neck:   No JVD or carotid bruits bilaterally  Cardiovascular:  IRIR, tachycardic, no rub/gallop appreciated  Pulmonary:   clear to auscultation bilaterally, no respiratory distress  Abdomen:   soft, non-tender, non-distended  Ext:   2+ LE edema bilaterally  Skin:  warm and dry, no obvious rashes seen  Neuro:   no obvious sensory or motor deficits  Psychiatric:   normal mood and affect    ECG:   Atrial fibrillation  PRWP  Heart rate 131 bpm    Data Review:   Lab Results   Component Value Date    CHOL 144 03/11/2023    CHOL 182 08/07/2022    CHOL 169 02/07/2022     Lab Results   Component Value Date    TRIG 79 03/11/2023    TRIG 113 08/07/2022    TRIG 84 02/07/2022     Lab Results   Component Value  Date    HDL 46 03/11/2023    HDL 45 08/07/2022    HDL 47 02/07/2022     No components found for: "LDLCHOLESTEROL", "LDLCALC"  Lab Results   Component Value Date    VLDL 16 03/11/2023    VLDL 23 08/07/2022    VLDL 29.5 02/07/2022     Lab Results   Component Value Date    CHOLHDLRATIO 3.1 03/11/2023    CHOLHDLRATIO 4.0 08/07/2022    CHOLHDLRATIO 3.6 02/07/2022        Lab Results   Component Value Date/Time    NA 137 04/03/2023 10:39 AM    NA 137 03/11/2023 08:58 AM    NA 141 11/28/2022 07:46 AM    K 4.8 04/03/2023 10:39 AM    K 5.6 03/11/2023 08:58 AM    K 4.7 11/28/2022 07:46 AM    CL 102 04/03/2023 10:39 AM    CL 101 03/11/2023 08:58 AM    CL 104 11/28/2022 07:46 AM    CO2 24 04/03/2023 10:39 AM    CO2 25 03/11/2023 08:58 AM    CO2 26 11/28/2022 07:46 AM    BUN 21 04/03/2023 10:39 AM    BUN 19 03/11/2023 08:58 AM    BUN 17 11/28/2022 07:46 AM    CREATININE 1.15 04/03/2023 10:39 AM    CREATININE 1.26 03/11/2023 08:58 AM    CREATININE 1.12 11/28/2022 07:46 AM    GLUCOSE 107 04/03/2023 10:39 AM    GLUCOSE 120 03/11/2023 08:58 AM    GLUCOSE 121 11/28/2022 07:46 AM    GLUCOSE 127 11/21/2021 09:42 AM    CALCIUM 9.3 04/03/2023 10:39 AM    CALCIUM 9.2 03/11/2023 08:58 AM    CALCIUM 9.5 11/28/2022 07:46 AM  Lab Results   Component Value Date    ALT 38 04/03/2023    ALT 24 03/11/2023    ALT 53 11/28/2022    AST 44 (H) 04/03/2023    AST 26 03/11/2023    AST 50 (H) 11/28/2022        Assessment/Plan:   1. DOE (dyspnea on exertion)  2. Bilateral lower extremity edema  3. Hx of CABG  4. Hypertension, unspecified type  5. Hyperlipidemia, unspecified hyperlipidemia type  6. Atrial fibrillation with RVR (HCC)    The patient presents today in clinic with DOE.  His ECG today in clinic is consistent with AF with RVR with an HR ~130 BPM.  This is newly diagnosed.  He had a SR noted on ECG in April 2024.  The patient has weeping edema bilaterally, and the patient reports abdominal bloating.  He has had worsening DOE and peripheral  edema over the last couple months.  Given signs and symptoms and AF with RVR, it is reasonable to admit the patient directly to telemetry.  Pursue a rate control strategy upfront with consideration for a rhythm control strategy prior to discharge.  Obtain a TTE, CXR, CMP, CBC, TSH, and BNP/NT-proBNP.  As long as blood work is acceptable, start therapeutic anticoagulation for CVA prophylaxis.  Start IVP Lasix pending blood work as well.    F/U: Posthospitalization    [name], MD

## 2023-04-17 NOTE — Other (Signed)
04/17/23 1610   Skin Integumentary    Skin Integumentary (WDL) X   Skin Color Ecchymosis (comment)   Skin Condition/Temp Dry;Swollen/edematous;Warm   Skin Integrity Ecchymosis;Scars (comment)   Location midsternal scar; R leg scar, R shoulder scar that extends to tailbone; scattered ecchy/scars; BLE/BUE edema   Skin Fold Management No   Nails WDL   Multiple Skin Integrity Sites No     4 Eyes Skin Assessment     NAME:  Cora Stetson  DATE OF BIRTH:  29-Jan-1956  MEDICAL RECORD NUMBER:  [id number]    The patient is being assessed for  Admission    I agree that at least one RN has performed a thorough Head to Toe Skin Assessment on the patient. ALL assessment sites listed below have been assessed.      Areas assessed by both nurses:    Shoulders, Back, Chest, Arms, Elbows, Hands, Sacrum. Buttock, Coccyx, Ischium, and Legs. Feet and Heels        Does the Patient have a Wound? No noted wound(s)       Braden Prevention initiated by RN: No  Wound Care Orders initiated by RN: No    Pressure Injury (Stage 3,4, Unstageable, DTI, NWPT, and Complex wounds) if present, place Wound referral order by RN under ORDER ENTRY: No    New Ostomies, if present place, Ostomy referral order under ORDER ENTRY: No     Nurse 1 eSignature: Electronically signed by Clemetine Marker, RN on 04/17/23 at 6:23 PM EST    **SHARE this note so that the co-signing nurse can place an eSignature**    Nurse 2 eSignature: Electronically signed by Soledad Gerlach, RN on 04/17/23 at 6:41 PM EST

## 2023-04-18 ENCOUNTER — Encounter: Payer: MEDICARE | Primary: Family Medicine

## 2023-04-18 LAB — ECHO (TTE) COMPLETE (PRN CONTRAST/BUBBLE/STRAIN/3D)
AV Area by Peak Velocity: 2.6 cm2
AV Area by VTI: 2.7 cm2
AV Mean Gradient: 7 mm[Hg]
AV Mean Gradient: 7 mm[Hg]
AV Mean Velocity: 1.3 m/s
AV Peak Gradient: 11 mm[Hg]
AV Peak Velocity: 1.6 m/s
AV VTI: 27.7 cm
AV Velocity Ratio: 0.69
AVA/BSA Peak Velocity: 1 cm2/m2
AVA/BSA VTI: 1.1 cm2/m2
Ao Root Index: 1.26 cm/m2
Aortic Root: 3.2 cm
Ascending Aorta Index: 1.57 cm/m2
Ascending Aorta: 4 cm
Body Surface Area: 2.63 m2
E/E' Lateral: 8.39
E/E' Ratio (Averaged): 11.41
E/E' Septal: 14.43
EF Physician: 55 %
Fractional Shortening 2D: 20 % (ref 28–44)
IVSd: 0.9 cm (ref 0.6–1.0)
LA Area 2C: 30.6 cm2
LA Area 4C: 26.8 cm2
LA Major Axis: 6.8 cm
LA Minor Axis: 6.8 cm
LA Volume BP: 100 mL — AB (ref 18–58)
LA Volume Index BP: 39 mL/m2 — AB (ref 16–34)
LA Volume Index MOD A2C: 45 mL/m2 — AB (ref 16–34)
LA Volume Index MOD A4C: 35 mL/m2 — AB (ref 16–34)
LA Volume MOD A2C: 115 mL — AB (ref 18–58)
LA Volume MOD A4C: 88 mL — AB (ref 18–58)
LV E' Lateral Velocity: 14.9 cm/s
LV E' Septal Velocity: 8.66 cm/s
LV Mass 2D Index: 75.4 g/m2 (ref 49–115)
LV Mass 2D: 191.6 g (ref 88–224)
LV RWT Ratio: 0.32
LVIDd Index: 2.2 cm/m2
LVIDd: 5.6 cm (ref 4.2–5.9)
LVIDs Index: 1.77 cm/m2
LVIDs: 4.5 cm
LVOT Area: 3.8 cm2
LVOT Diameter: 2.2 cm
LVOT Mean Gradient: 3 mm[Hg]
LVOT Mean Gradient: 3 mm[Hg]
LVOT Mean Gradient: 3 mm[Hg]
LVOT Peak Gradient: 5 mm[Hg]
LVOT Peak Velocity: 1.1 m/s
LVOT SV: 75.6 mL
LVOT Stroke Volume Index: 29.8 mL/m2
LVOT VTI: 19.9 cm
LVOT:AV VTI Index: 0.72
LVPWd: 0.9 cm (ref 0.6–1.0)
MV E Velocity: 1.25 m/s
MV E Wave Deceleration Time: 150 ms
PV Area by Continuity: 2.8 cm2
PV Max Velocity: 0.9 m/s
PV Peak Gradient: 3 mm[Hg]
Qp:Qs ratio: 0.51 no units
RVOT Area: 3.5 cm2
RVOT Diameter: 2.1 cm
RVOT Mean Gradient: 1 mm[Hg]
RVOT Peak Gradient: 2 mm[Hg]
RVOT Peak Velocity: 0.7 m/s
RVOT Stroke Volume: 38.8 mL
RVOT VTI: 11.2 cm
TAPSE: 1.9 cm (ref 1.7–?)

## 2023-04-18 LAB — BASIC METABOLIC PANEL
Anion Gap: 10 mmol/L (ref 7–16)
BUN: 20 mg/dL (ref 8–23)
CO2: 24 mmol/L (ref 20–29)
Calcium: 8.7 mg/dL — ABNORMAL LOW (ref 8.8–10.2)
Chloride: 103 mmol/L (ref 98–107)
Creatinine: 1.12 mg/dL (ref 0.80–1.30)
Est, Glom Filt Rate: 72 mL/min/{1.73_m2} (ref >60)
Glucose: 131 mg/dL — ABNORMAL HIGH (ref 70–99)
Potassium: 4.3 mmol/L (ref 3.5–5.1)
Sodium: 137 mmol/L (ref 136–145)

## 2023-04-18 LAB — MAGNESIUM: Magnesium: 1.7 mg/dL — ABNORMAL LOW (ref 1.8–2.4)

## 2023-04-18 MED ORDER — METOPROLOL SUCCINATE ER 100 MG PO TB24
100 MG | Freq: Every day | ORAL | Status: AC
Start: 2023-04-18 — End: 2023-04-19
  Administered 2023-04-18 – 2023-04-19 (×2): 100 mg via ORAL

## 2023-04-18 MED ORDER — LISINOPRIL 20 MG PO TABS
20 MG | Freq: Every day | ORAL | Status: AC
Start: 2023-04-18 — End: 2023-04-19
  Administered 2023-04-19: 14:00:00 20 mg via ORAL

## 2023-04-18 MED ORDER — GABAPENTIN 300 MG PO CAPS
300 MG | Freq: Three times a day (TID) | ORAL | Status: AC
Start: 2023-04-18 — End: 2023-04-19
  Administered 2023-04-19 (×2): 600 mg via ORAL

## 2023-04-18 MED ORDER — RIVAROXABAN 20 MG PO TABS
20 MG | Freq: Every day | ORAL | Status: AC
Start: 2023-04-18 — End: 2023-04-19
  Administered 2023-04-18 – 2023-04-19 (×2): 20 mg via ORAL

## 2023-04-18 MED FILL — CETIRIZINE HCL 10 MG PO TABS: 10 MG | ORAL | Qty: 1

## 2023-04-18 MED FILL — ALLOPURINOL 300 MG PO TABS: 300 MG | ORAL | Qty: 1

## 2023-04-18 MED FILL — GABAPENTIN 300 MG PO CAPS: 300 MG | ORAL | Qty: 2

## 2023-04-18 MED FILL — LISINOPRIL 5 MG PO TABS: 5 MG | ORAL | Qty: 2

## 2023-04-18 MED FILL — FUROSEMIDE 10 MG/ML IJ SOLN: 10 MG/ML | INTRAMUSCULAR | Qty: 4

## 2023-04-18 MED FILL — DILTIAZEM HCL 100 MG IV SOLR: 100 MG | INTRAVENOUS | Qty: 1

## 2023-04-18 MED FILL — DOXYCYCLINE HYCLATE 100 MG PO CAPS: 100 MG | ORAL | Qty: 1

## 2023-04-18 MED FILL — ZOLPIDEM TARTRATE 5 MG PO TABS: 5 MG | ORAL | Qty: 1

## 2023-04-18 MED FILL — METOPROLOL SUCCINATE ER 100 MG PO TB24: 100 MG | ORAL | Qty: 1

## 2023-04-18 MED FILL — XARELTO 20 MG PO TABS: 20 MG | ORAL | Qty: 1

## 2023-04-18 NOTE — ACP (Advance Care Planning) (Signed)
Advance Care Planning     Advance Care Planning Activator (Inpatient)  Conversation Note      Date of ACP Conversation: 04/18/2023     Conversation Conducted with: Patient with Decision Making Capacity    ACP Activator: York Ram, RN      Health Care Decision Maker:     Current Designated Health Care Decision Maker:     Primary Decision Maker: Chad Rosario, Chad Rosario - [phone]    Secondary Decision Maker: Chad Rosario Child [phone]  Click here to complete Healthcare Decision Makers including section of the Healthcare Decision Maker Relationship (ie "Primary")  Today we documented Decision Maker(s) consistent with Legal Next of Kin hierarchy.    Care Preferences: Full code status ordered by Tawnya Crook, PA.

## 2023-04-18 NOTE — Care Coordination-Inpatient (Signed)
Patient hospitalized in OP status with volume overload. IV diuretic. Room air.  CM met with patient and his spouse for assessment. Patient is alert and oriented X 4. Verified PCP, insurance and demographic. Lives with his spouse in a house. Independent with ADLs and drives himself. Uses a cane. Has a RW but doesn't use it. Compliant with CPAP nightly. Affordable follow up and prescriptions.   No anticipated CM needs for patient to return home with spouse's assistance.     04/18/23 1253   Service Assessment   Patient Orientation Alert and Oriented   Cognition Alert   History Provided By Patient   Primary Caregiver Self   Accompanied By/Relationship Spouse   Support Systems Spouse/Significant Other;Children;Family Members   Patient's Healthcare Decision Maker is: Legal Next of Kin   PCP Verified by CM Yes  (Dr Ardell Isaacs)   Last Visit to PCP Within last 3 months   Prior Functional Level Independent in ADLs/IADLs   Current Functional Level Independent in ADLs/IADLs   Can patient return to prior living arrangement Yes   Ability to make needs known: Good   Family able to assist with home care needs: Yes   Would you like for me to discuss the discharge plan with any other family members/significant others, and if so, who? Yes  (Spouse)   Chief Executive Officer;Other (Comment)  ([employer] supplment)   Community Resources None   CM/SW Referral Other (see comment)  (N/A)   Social/Functional History   Lives With Spouse   Type of Home House   Home Equipment Cane;Walker - Rolling   Receives Help From Family   Prior Level of Assist for ADLs Independent   Prior Level of Assist for [employer] Independent   Ambulation Assistance Independent   Prior Level of Assist for Transfers Independent   Active Driver Yes   Mode of Engineer, water   Occupation Retired   Building control surveyor   Type of Residence [facility]   Living Arrangements Spouse/Significant Other;Children   Current Services Prior To Admission C-pap   Potential Assistance Needed  N/A   DME Ordered? No   Potential Assistance Purchasing Medications No   Type of Home Care Services None   Patient expects to be discharged to: [facility] At/After Discharge   Transition of Care Consult (CM Consult) Discharge Planning   Services At/After Discharge None   [facility] Information Provided? No   Mode of Transport at Discharge Other (see comment)  (Spouse transport by car.)   Confirm Follow Up Transport Family   Condition of Participation: Discharge Planning   The Plan for Transition of Care is related to the following treatment goals: Home with family assistance   The Patient and/Or Patient Representative agree with the Discharge Plan? Yes

## 2023-04-18 NOTE — Progress Notes (Signed)
Spiritual Health History and Assessment/Progress Note  [facility]    (P) Spiritual/Emotional Needs,  ,  ,      Name: Chad Rosario MRN: [id number]    Age: 68 y.o.     Sex: male   Language: English   Religion: Christian   Volume overload     Date: 04/18/2023            Total Time Calculated: (P) 20 min              Spiritual Assessment began in [facility] 4 TELEMETRY        Referral/Consult From: (P) Rounding   Encounter Overview/Reason: (P) Spiritual/Emotional Needs  Service Provided For: (P) Patient    Faith, Belief, Meaning:   Patient Other: Does not identify as spiritual.  Family/Friends No family/friends present      Importance and Influence:  Patient has no beliefs influential to healthcare decision-making identified during this visit  Family/Friends No family/friends present    Community:  Patient feels well-supported. Support system includes: Spouse/Partner and Children  Family/Friends No family/friends present    Assessment and Plan of Care:     Patient Interventions include: Facilitated expression of thoughts and feelings, Engaged in theological reflection, and Affirmed coping skills/support systems  Family/Friends Interventions include: No family/friends present    Patient Plan of Care: Spiritual Care available upon further referral  Family/Friends Plan of Care: No family/friends present    Electronically signed by Gabriela Eves, BCC on 04/18/2023 at 10:25 AM    Patient said he is doing well but has physical limits that mean he cannot do all the things he wants to do. He enjoys spending time with his grandchildren and other family. He hopes to travel soon and recover as much health and function as possible.

## 2023-04-18 NOTE — Progress Notes (Signed)
[facility] PROGRESS NOTE           04/18/2023 7:36 AM    Admit Date: 04/17/2023      Subjective:   No cp or sob    Objective:      Vitals:    04/17/23 2049 04/17/23 2355 04/18/23 0446 04/18/23 0713   BP: (!) 142/92 125/69 127/66 123/72   Pulse: 100 (!) 109 52 (!) 107   Resp: 18 16 18     Temp: 97.7 F (36.5 C) 97.9 F (36.6 C) 97.7 F (36.5 C) 98.1 F (36.7 C)   TempSrc: Oral Oral Oral    SpO2: 95% 96% 95% 95%   Weight:   129.4 kg (285 lb 4.8 oz)    Height:           Physical Exam:  General-No Acute Distress  Neck- supple, no JVD  CV- irregular rate and rhythm no MRG  Lung- clear bilaterally  Abd- soft, nontender, nondistended  Ext- tr edema bilaterally.  Skin- warm and dry    Data Review:   Recent Labs     04/17/23  1644 04/18/23  0543   NA 137 137   K 4.6 4.3   MG  --  1.7*   BUN 21 20   WBC 9.6  --    HGB 16.4  --    HCT 48.5  --    PLT 109*  --      Echo: Pending    Assessment/Plan:     A fib, new onset  Ascvd, prior cabg  Statin intolerance  Htn  Chronic venous insufficiency  Osa, on cpap  Hx dvt  Hx gout  ///  Stop diltiazem  Start Toprol  Change Lasix to po  Home tomorrow if all ok  Address a fib as an outpat.      Roselind Messier, MD  04/18/2023 7:36 AM

## 2023-04-18 NOTE — Consults (Signed)
I will follow this patient for CHF education pending his echo and the provider's findings. Thank you for this consult.

## 2023-04-19 LAB — BASIC METABOLIC PANEL
Anion Gap: 11 mmol/L (ref 7–16)
BUN: 25 mg/dL — ABNORMAL HIGH (ref 8–23)
CO2: 25 mmol/L (ref 20–29)
Calcium: 9 mg/dL (ref 8.8–10.2)
Chloride: 100 mmol/L (ref 98–107)
Creatinine: 1.27 mg/dL (ref 0.80–1.30)
Est, Glom Filt Rate: 62 mL/min/{1.73_m2} (ref >60)
Glucose: 125 mg/dL — ABNORMAL HIGH (ref 70–99)
Potassium: 4.6 mmol/L (ref 3.5–5.1)
Sodium: 136 mmol/L (ref 136–145)

## 2023-04-19 LAB — MAGNESIUM: Magnesium: 1.8 mg/dL (ref 1.8–2.4)

## 2023-04-19 MED ORDER — METOPROLOL SUCCINATE ER 25 MG PO TB24
25 | Freq: Two times a day (BID) | ORAL | Status: DC
Start: 2023-04-19 — End: 2023-04-19

## 2023-04-19 MED ORDER — LISINOPRIL 20 MG PO TABS
20 MG | ORAL_TABLET | Freq: Every day | ORAL | 3 refills | Status: DC
Start: 2023-04-19 — End: 2023-05-09

## 2023-04-19 MED ORDER — RIVAROXABAN 20 MG PO TABS
20 | ORAL_TABLET | Freq: Every day | ORAL | 11 refills | 90.00000 days | Status: DC
Start: 2023-04-19 — End: 2023-08-19

## 2023-04-19 MED ORDER — METOPROLOL SUCCINATE ER 50 MG PO TB24
50 | ORAL_TABLET | Freq: Two times a day (BID) | ORAL | 3 refills | Status: AC
Start: 2023-04-19 — End: ?

## 2023-04-19 MED ORDER — FUROSEMIDE 40 MG PO TABS
40 | ORAL_TABLET | Freq: Every day | ORAL | 3 refills | Status: AC | PRN
Start: 2023-04-19 — End: ?

## 2023-04-19 MED ORDER — FUROSEMIDE 40 MG PO TABS
40 | Freq: Every day | ORAL | Status: DC | PRN
Start: 2023-04-19 — End: 2023-04-19

## 2023-04-19 MED FILL — ALLOPURINOL 300 MG PO TABS: 300 MG | ORAL | Qty: 1

## 2023-04-19 MED FILL — GABAPENTIN 300 MG PO CAPS: 300 MG | ORAL | Qty: 2

## 2023-04-19 MED FILL — XARELTO 20 MG PO TABS: 20 MG | ORAL | Qty: 1

## 2023-04-19 MED FILL — DOXYCYCLINE HYCLATE 100 MG PO CAPS: 100 MG | ORAL | Qty: 1

## 2023-04-19 MED FILL — CETIRIZINE HCL 10 MG PO TABS: 10 MG | ORAL | Qty: 1

## 2023-04-19 MED FILL — ZOLPIDEM TARTRATE 5 MG PO TABS: 5 MG | ORAL | Qty: 1

## 2023-04-19 MED FILL — METOPROLOL SUCCINATE ER 100 MG PO TB24: 100 MG | ORAL | Qty: 1

## 2023-04-19 MED FILL — LISINOPRIL 20 MG PO TABS: 20 MG | ORAL | Qty: 1

## 2023-04-19 NOTE — Progress Notes (Signed)
Physician Progress Note      PATIENT:               LEONCE, BALE  CSN #:                  [id number]  DOB:                       1955-03-30  ADMIT DATE:       04/17/2023 3:52 PM  DISCH DATE:        04/19/2023 11:09 AM  RESPONDING  PROVIDER #:        Meredith Pel MD          QUERY TEXT:    Patient admitted with atrial fibrillation and is maintained on Xarelto. If   possible, please document in progress notes and discharge summary if you are   evaluating and/or treating any of the following:?  ?  The medical record reflects the following:  Risk Factors: Patient is an 68 -year-old male with hypertension and CHF,   history of Atrial fibrillation  Clinical Indicators: maintained on XARELTO  Treatment: XARELTO management, bleeding precautions    Thank you. Rogue Jury, RN, Clinical Documentation Integrity, Revenue   [employer]ycle, [facility], CRCR  Options provided:  -- Secondary hypercoagulable state in a patient with atrial fibrillation  -- Other - I will add my own diagnosis  -- Disagree - Not applicable / Not valid  -- Disagree - Clinically unable to determine / Unknown  -- Refer to Clinical Documentation Reviewer    PROVIDER RESPONSE TEXT:    Provider disagreed with this query.    Query created by: Francine Graven on 04/22/2023 9:11 AM      Electronically signed by:  Meredith Pel MD 04/22/2023 9:14 AM

## 2023-04-19 NOTE — Progress Notes (Signed)
Discharge instructions were reviewed with patient. An opportunity was given for questions. All medications were reviewed, and information was given on the new medications. Patient verbalized understanding, and has no questions at this time.   Iv and monitor removed. Pt waiting on ride

## 2023-04-19 NOTE — Progress Notes (Signed)
Physician Progress Note      PATIENT:               Chad Rosario, Chad Rosario  CSN #:                  [id number]  DOB:                       04/04/1955  ADMIT DATE:       04/17/2023 3:52 PM  DISCH DATE:        04/19/2023 11:09 AM  RESPONDING  PROVIDER #:        Meredith Pel MD          QUERY TEXT:    Patient presents with shortness of breath and lower extremity edema. He was   noted to volume overloaded and in new a fib w RVR.  Patient was evaluated and   subsequently admitted for further cardiac evaluation and treatment.  In   discharge summary states that Chronic venous insufficiency Possible component   of HFpEF plan for as needed diuretics prior to follow-up. IV lasix given. BNP   elevated 901 on 1-23.  If possible, please document in progress notes and   discharge summary further specificity regarding the type and acuity of CHF:    The medical record reflects the following:  Risk Factors: Volume overload  Clinical Indicators: DOE, lower extremity peripheral edema  Diuresis - 4.8L.   BNP elevated 901 on 1-23  Treatment: low sodium diet, 2 liter per day fluid restriction and daily   weights.  patient will continue use of BB and ACE-I.    Thank you.  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    Query created by: Francine Graven on 04/22/2023 9:05 AM      Electronically signed by:  Meredith Pel MD 04/22/2023 9:09 AM

## 2023-04-19 NOTE — Care Coordination-Inpatient (Signed)
Discharge order is in. Pt is discharging home today in stable condition. No discharge needs identified by CM. Tx goals met.     04/19/23 0905   Services At/After Discharge   Transition of Care Consult (CM Consult) Discharge Planning   Services At/After Discharge None   Veteran Resource Information Provided? No   Mode of Transport at Discharge Other (see comment)  (Family)   Confirm Follow Up Transport Family   Condition of Participation: Discharge Planning   The Patient and/or Patient Representative was provided with a Choice of Provider? Patient   The Patient and/Or Patient Representative agree with the Discharge Plan? Yes   Freedom of Choice list was provided with basic dialogue that supports the patient's individualized plan of care/goals, treatment preferences, and shares the quality data associated with the providers?  Yes

## 2023-04-19 NOTE — Plan of Care (Signed)
Problem: Discharge Planning  Goal: Discharge to home or other facility with appropriate resources  Outcome: Adequate for Discharge  Flowsheets (Taken 04/18/2023 1950 by Wynelle Bourgeois, RN)  Discharge to home or other facility with appropriate resources: Identify barriers to discharge with patient and caregiver     Problem: ABCDS Injury Assessment  Goal: Absence of physical injury  Outcome: Adequate for Discharge     Problem: Safety - Adult  Goal: Free from fall injury  Outcome: Adequate for Discharge

## 2023-04-19 NOTE — Discharge Summary (Signed)
[facility] Discharge Summary     Patient ID:  Chad Rosario  [id number]  68 y.o.  08-Mar-1956    Admit date: 04/17/2023    Discharge date:  04/19/2023    Admitting Physician: [name], MD     Discharge Physician: Lou Miner, PA/Dr. 25    Admission Diagnoses: Volume overload [E87.70]    Discharge Diagnoses:   Patient Active Problem List    Diagnosis Date Noted    Thrombocytopenia (HCC) 01/11/2021    Obesity (BMI 30-39.9) 11/23/2020    History of DVT (deep vein thrombosis) 11/23/2020    Hx of CABG 11/23/2020    Hypertension 11/23/2020    Hyperlipidemia 11/23/2020    DOE (dyspnea on exertion) 11/23/2020    Bilateral lower extremity edema 04/17/2023    Atrial fibrillation with RVR (HCC) 04/17/2023    Unsteadiness on feet 04/10/2023    H/O seasonal allergies 07/22/2022    Acute medial meniscal tear 11/30/2021    Neuropathic pain 10/18/2021    Paresthesia 10/18/2021    Bilateral leg weakness 10/18/2021    Foot drop, bilateral 10/18/2021    Cramps of lower extremity 10/18/2021    Idiopathic chronic gout of multiple sites without tophus 07/05/2020    Insomnia 05/23/2020    Primary osteoarthritis of left hip 02/22/2019    Lumbar facet joint pain 12/25/2018    Lumbar postlaminectomy syndrome 12/25/2018    Ataxia 06/29/2018    Chronic pain disorder 06/16/2018    GERD without esophagitis 06/16/2018    Lumbar radiculopathy, chronic 03/02/2018    Idiopathic peripheral neuropathy 05/30/2017    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    IFG (impaired fasting glucose) 11/14/2016    Low back pain 11/14/2016    Obstructive sleep apnea syndrome 11/14/2016    Deep vein thrombosis (DVT) of femoral vein of right lower extremity (HCC) 11/01/2015    Gout of both feet 06/28/2015    Idiopathic gout 06/28/2015    Colon adenoma 03/09/2015    Dyslipidemia 05/05/2014    Coronary artery disease involving native coronary artery of native heart without angina pectoris 11/16/2013    S/P angioplasty with stent  10/30/2013    Lumbar stenosis 05/18/2012    Degeneration of intervertebral disc of lumbar region 05/14/2012    Allergic rhinitis 01/17/2011    Volume overload 04/17/2023       Cardiology Procedures this admission:  TTE  Consults: None    Hospital Course: Patient presented to the office of [facility] with complaints of progressive shortness of breath and lower extremity edema. He was noted to volume overloaded and in new a fib w RVR.  Patient was evaluated and subsequently admitted for further cardiac evaluation and treatment.  The patient was started on IV Lasix for diuresis.  He/She diuresed well and felt better.  An echocardiogram was performed with report as follows:       Left Ventricle: Normal left ventricular systolic function with a visually estimated EF of 55 - 60%. Left ventricle size is normal. Normal wall thickness. Normal wall motion. Indeterminate diastolic function due to atrial fibrillation.    Aortic Valve: Moderately thickened cusps. Mildly calcified cusps. Moderate sclerosis of the aortic valve cusps.    Mitral Valve: Moderate annular calcification. Mildly thickened, at the anterior leaflet. Mild regurgitation.    Tricuspid Valve: Trace regurgitation. Unable to assess RVSP due to inadequate or insignificant tricuspid regurgitation.    Left Atrium: Left atrium is moderately dilated. LA Vol Index is  39-45 ml/m2.  Right Atrium: Right atrium is mildly dilated.    Aorta: Normal sized aortic root. Ao root diameter is 3.2 cm. Mildly dilated ascending aorta. Ao ascending diameter is 4.0 cm.    Image quality is adequate.    Xarelto dose changed to dose for a fib.  Rate controlled w BB.    The morning of 04/19/2023 patient was up feeling well without any complaints shortness of breath.  LE edema was much improved.  Diuresed - 4.8L. Patient's labs were stable with creatinine of 1.27. Patient was seen and examined by Dr. Danella Maiers and determined stable and ready for discharge. Patient was instructed on  the importance of medication compliance, low sodium diet, 2 liter per day fluid restriction and daily weights.  For maximized medical therapy of congestive heart failure, patient will continue use of BB and ACE-I.  The patient will have close transitional care follow up with [facility] Dr Freddi Starr- we will call pt with appt.     [facility] office has been informed you need a follow up appointment after discharge. You will be called  with the follow up appointment. If you have NOT heard from our office after the next business day, please contact our office for appointment at [phone].      DISPOSITION: The patient is being discharged home in stable condition on a low saturated fat, low cholesterol and low salt diet. The patient is instructed to advance activities as tolerated to the limit of fatigue or shortness of breath.  The patient is informed to monitor daily weights and maintain a 2 liter per day fluid restriction.  The patient is instructed to call the office for any shortness of breath, weight gain, or increased peripheral edema.        Discharge Exam: BP 124/78   Pulse (!) 104   Temp 98.1 F (36.7 C) (Oral)   Resp 18   Ht 1.854 m (6\' 1" )   Wt 129.9 kg (286 lb 6.4 oz)   SpO2 95%   BMI 37.79 kg/m   Patient has been seen by Dr. Danella Maiers: see his progress note for exam details.    Recent Results (from the past 24 hour(s))   Basic Metabolic Panel    Collection Time: 04/19/23  5:12 AM   Result Value Ref Range    Sodium 136 136 - 145 mmol/L    Potassium 4.6 3.5 - 5.1 mmol/L    Chloride 100 98 - 107 mmol/L    CO2 25 20 - 29 mmol/L    Anion Gap 11 7 - 16 mmol/L    Glucose 125 (H) 70 - 99 mg/dL    BUN 25 (H) 8 - 23 MG/DL    Creatinine 4.54 0.98 - 1.30 MG/DL    Est, Glom Filt Rate 62 >60 ml/min/1.63m2    Calcium 9.0 8.8 - 10.2 MG/DL   Magnesium    Collection Time: 04/19/23  5:12 AM   Result Value Ref Range    Magnesium 1.8 1.8 - 2.4 mg/dL     Serum creatinine: 1.19 mg/dL 14/78/29 5621  Estimated  creatinine clearance: 80 mL/min      Patient Instructions:      Medication List        START taking these medications      furosemide 40 MG tablet  Commonly known as: LASIX  Take 1 tablet by mouth daily as needed (take one tablet daily for weight garin greater than 2 lbs in 24 hours or 4 lbs in 48  hours.)     metoprolol succinate 50 MG extended release tablet  Commonly known as: TOPROL XL  Take 1 tablet by mouth 2 times daily            CHANGE how you take these medications      lisinopril 20 MG tablet  Commonly known as: PRINIVIL;ZESTRIL  Take 1.5 tablets by mouth daily  What changed: medication strength     rivaroxaban 20 MG Tabs tablet  Commonly known as: XARELTO  Take 1 tablet by mouth daily (with breakfast)  Start taking on: April 20, 2023  What changed:   medication strength  how much to take            CONTINUE taking these medications      allopurinol 300 MG tablet  Commonly known as: ZYLOPRIM  TAKE 1 TABLET BY MOUTH DAILY     Alpha-Lipoic Acid 200 MG Caps     cholecalciferol 400 UNIT Tabs tablet  Commonly known as: VITAMIN D3     colchicine 0.6 MG capsule  Commonly known as: MITIGARE  Take 1 capsule by mouth as needed for Pain     CPAP Machine Misc     doxycycline hyclate 100 MG capsule  Commonly known as: VIBRAMYCIN     fexofenadine 180 MG tablet  Commonly known as: ALLEGRA     gabapentin 600 MG tablet  Commonly known as: Neurontin  Take 1 tablet by mouth in the morning, at noon, in the evening, and at bedtime.     therapeutic multivitamin-minerals tablet     VITAMIN B 12 PO     zolpidem 5 MG tablet  Commonly known as: AMBIEN  TAKE 1 TABLET BY MOUTH EVERY NIGHT AS NEEDED FOR SLEEP. MAX DAILY AMOUNT: 5 MG               Where to Get Your Medications        These medications were sent to [facility] [address], [place] - [address] [name] [phone] Carmon Ginsberg [phone]  [address] [place] [address]      Phone: [phone]   furosemide 40 MG tablet  lisinopril 20 MG  tablet  metoprolol succinate 50 MG extended release tablet  rivaroxaban 20 MG Tabs tablet             Signed:  Lou Miner, PA-C  04/19/2023  8:50 AM

## 2023-04-19 NOTE — Discharge Instructions (Addendum)
 rivaroxaban  Pronunciation:  RIV a ROX a ban  Brand:  Xarelto  What is the most important information I should know about rivaroxaban?  Do not stop taking rivaroxaban without first talking to your doctor. Stopping suddenly can increase your risk of blood clot or stroke.   Call your doctor at once if you have signs of bleeding such as: headaches, feeling very weak or dizzy, bleeding gums, nosebleeds, heavy menstrual periods or abnormal vaginal bleeding, blood in your urine, bloody or tarry stools, coughing up blood or vomit that looks like coffee grounds.  Many other drugs can increase your risk of bleeding when used with rivaroxaban. Tell your doctor about all medicines you have recently used.  Rivaroxaban can cause a very serious blood clot around your spinal cord if you undergo a spinal tap or receive spinal anesthesia (epidural). Tell any doctor who treats you that you are taking rivaroxaban.  What is rivaroxaban?  Rivaroxaban is used to treat or prevent blood clots (venous thromboembolism, or VTE). Blood clots can occur in the legs (deep vein thrombosis, DVT) or the lungs (pulmonary embolism, PE).  Blood clots can develop when you are very ill and cannot move around as much as normal, such as during or after a stay in the hospital. Blood clots may also develop after knee or hip replacement surgery.  Rivaroxaban is sometimes used to lower your risk of a blood clot coming back after you have received treatment for blood clots for at least 6 months.  Rivaroxaban is also used in people with atrial fibrillation (a heart rhythm disorder) to lower the risk of stroke caused by a blood clot.  Rivaroxaban is also given together with aspirin to lower the risk of stroke, heart attack, or other serious heart problems in people with coronary artery disease (decreased blood flow to the heart) or peripheral artery disease (decreased blood flow to the legs).  Rivaroxaban may also be used for purposes not listed in this  medication guide.  What should I discuss with my healthcare provider before taking rivaroxaban?  You should not use rivaroxaban if you are allergic to it, or if you have active or uncontrolled bleeding.  Rivaroxaban can cause a very serious blood clot around your spinal cord if you undergo a spinal tap or receive spinal anesthesia (epidural). This type of blood clot could cause long-term paralysis, and may be more likely to occur if:    you have a genetic spinal defect;  you have a spinal catheter in place;  you have a history of spinal surgery or repeated spinal taps;  you have recently had a spinal tap or epidural anesthesia;  you are taking an NSAID--Advil, Aleve, Motrin, and others; or  you are using other medicines to treat or prevent blood clots.  Rivaroxaban may cause you to bleed more easily, especially if you have:   a bleeding disorder that is inherited or caused by disease;  hemorrhagic stroke;  uncontrolled high blood pressure;  stomach or intestinal bleeding or ulcer; or  if you take certain medicines such as aspirin, enoxaparin, heparin, warfarin (Coumadin, Jantoven), clopidogrel (Plavix), or certain antidepressants.  Tell your doctor if you have ever had:  antiphospholipid syndrome (also called Hughes syndrome or "sticky blood syndrome"), an immune system disorder that increases the risk of blood clots;  an artificial heart valve; or  liver or kidney disease.  Taking rivaroxaban during pregnancy may cause bleeding in the mother or the unborn baby. Tell your doctor if you are  pregnant or plan to become pregnant.  It may not be safe to breastfeed a baby while you are using this medicine. Ask your doctor about any risks.  How should I take rivaroxaban?  Follow all directions on your prescription label and read all medication guides or instruction sheets. Your doctor may occasionally change your dose. Use the medicine exactly as directed.  The number of times you take rivaroxaban each day will depend on  the reason you are using this medication.  For some conditions, rivaroxaban should be taken with food. Whether you take the medicine with or without food may also depend on the tablet strength you take. Follow your doctor's dosing instructions very carefully.  Tell your doctor if you have trouble swallowing a rivaroxaban tablet.  Tell any doctor who treats you that you are using rivaroxaban. If you need surgery or dental work, tell the surgeon or dentist ahead of time that you are using this medication. If you need anesthesia for a medical procedure or surgery, you may need to stop using rivaroxaban for a short time.  Do not change your dose or stop taking this medication without first talking to your doctor. Stopping suddenly can increase your risk of blood clot or stroke.  Store at room temperature away from moisture and heat.  What happens if I miss a dose?  If you take rivaroxaban 1 time each day: Take the medicine as soon as you remember, and then go back to your regular schedule. Do not take two doses in the same day.  If you take the 2.5-milligram tablet 2 times each day: Skip the missed dose and take your next dose at the regular time.  If you take the 15-milligram tablet 2 times each day: Take the missed dose on the same day you remember it. You may take 2 doses at the same time to make up a missed dose.   Get your prescription refilled before you run out of medicine completely.  What happens if I overdose?  Seek emergency medical attention or call the Poison Help line at [phone].  Overdose may cause excessive bleeding.  What should I avoid while taking rivaroxaban?  Avoid activities that may increase your risk of bleeding or injury. Use extra care to prevent bleeding while shaving or brushing your teeth.  What are the possible side effects of rivaroxaban?  Get emergency medical help if you have signs of an allergic reaction: hives; difficult breathing; swelling of your face, lips, tongue, or  throat.  Also seek emergency medical attention if you have symptoms of a spinal blood clot: back pain, numbness or muscle weakness in your lower body, or loss of bladder or bowel control.  Rivaroxaban can cause you to bleed more easily. Call your doctor at once if you have signs of bleeding such as:  easy bruising or bleeding (nosebleeds, bleeding gums, heavy menstrual bleeding);  pain, swelling, new drainage, or excessive bleeding from a wound or where a needle was injected in your skin;  any bleeding that will not stop;  headaches, dizziness, weakness, feeling like you might pass out;  urine that looks red, pink, or brown; or  bloody or tarry stools, coughing up blood or vomit that looks like coffee grounds.  Bleeding is the most common side effect of rivaroxaban.   This is not a complete list of side effects and others may occur. Call your doctor for medical advice about side effects. You may report side effects to FDA at 1-800-FDA-1088.  What other drugs will affect rivaroxaban?  Sometimes it is not safe to use certain medications at the same time. Some drugs can affect your blood levels of other drugs you take, which may increase side effects or make the medications less effective.  Other drugs may affect rivaroxaban, including prescription and over-the-counter medicines, vitamins, and herbal products. Tell your doctor about all your current medicines and any medicine you start or stop using.  Where can I get more information?  Your pharmacist can provide more information about rivaroxaban.  Remember, keep this and all other medicines out of the reach of children, never share your medicines with others, and use this medication only for the indication prescribed.   Every effort has been made to ensure that the information provided by [employer]') is accurate, up-to-date, and complete, but no guarantee is made to that effect. Drug information contained herein may be time sensitive. [employer]  information has been compiled for use by healthcare practitioners and consumers in the [place] and therefore [employer] does not warrant that uses outside of the [place] are appropriate, unless specifically indicated otherwise. [employer]'s drug information does not endorse drugs, diagnose patients or recommend therapy. [employer]'s drug information is an [employer], corporate to assist licensed healthcare practitioners in caring for their patients and/or to serve consumers viewing this service as a supplement to, and not a substitute for, the expertise, skill, knowledge and judgment of healthcare practitioners. The absence of a warning for a given drug or drug combination in no way should be construed to indicate that the drug or drug combination is safe, effective or appropriate for any given patient. [employer] does not assume any responsibility for any aspect of healthcare administered with the aid of information [employer] provides. The information contained herein is not intended to cover all possible uses, directions, precautions, warnings, drug interactions, allergic reactions, or adverse effects. If you have questions about the drugs you are taking, check with your doctor, nurse or pharmacist.  Copyright [phone] [employer] Version: 8.01. Revision date: 09/04/2018.  Care instructions adapted under license by [facility]. If you have questions about a medical condition or this instruction, always ask your healthcare professional. [employer] disclaims any warranty or liability for your use of this information.       Atrial Fibrillation: Care Instructions  Overview     Atrial fibrillation is an irregular and often fast heartbeat. Treating this condition is important for several reasons. It can cause blood clots, which can travel from your heart to your brain and cause a stroke. An irregular heartbeat can also increase your risk for heart failure. If you have an episode of atrial  fibrillation, you may feel a fluttering, racing, or pounding feeling in your chest called palpitations. You may feel short of breath, lightheaded, dizzy, or weak.  Treatment can help you feel better and prevent future problems. Treatments can slow the heart rate, control the heart rhythm, or help prevent stroke. You will likely take medicine. You may have a procedure, such as electrical cardioversion or catheter ablation.  You can live well and help manage atrial fibrillation by having a heart-healthy lifestyle. This lifestyle may help reduce symptoms and how often you have episodes.  Follow-up care is a key part of your treatment and safety. Be sure to make and go to all appointments, and call your doctor if you are having problems. It's also a good idea to know your test results and keep a list of  the medicines you take.  How can you care for yourself at home?  Be safe with medicines.  Take your medicines exactly as prescribed. Call your doctor if you think you are having a problem with your medicine. You will get more details on the specific medicines your doctor prescribes.  If your doctor has given you a blood thinner to prevent a stroke, be sure you get instructions about how to take your medicine safely. Blood thinners can cause serious bleeding problems.  Do not take any vitamins, over-the-counter drugs, or herbal products without talking to your doctor first.  Have a heart-healthy lifestyle.  Try to quit or cut back on using tobacco and other nicotine products. This includes smoking and vaping. Avoid secondhand smoke too.  Eat heart-healthy foods. These include vegetables, fruits, nuts, beans, lean meat, fish, and whole grains. Limit sodium and sugar.  Limit alcohol. Limit alcohol to 2 drinks a day for men and 1 drink a day for women. Avoid alcohol if it triggers symptoms.  Be active. Try to get at least 30 minutes of physical activity on most days of the week. Talk to your doctor about what type and level  of exercise is safe for you.  Stay at a weight that's healthy for you. Talk to your doctor if you need help losing weight.  Try to manage stress.  Try to get 7 to 9 hours of sleep each night.  Manage other health problems such as high blood pressure, high cholesterol, and diabetes. If you think you may have a problem with alcohol or drug use, talk to your doctor.  Avoid infections such as COVID-19, colds, and the flu. Get the flu vaccine every year. Get a pneumococcal vaccine. If you have had one before, ask your doctor whether you need another dose. Stay up to date on your COVID-19 vaccines.  Learn how to manage symptoms.  Make a symptom action plan with your doctor. This will help you know what to do when you have an episode of atrial fibrillation.  Use a symptom diary to find what triggers your symptoms. If you know your triggers, you can take steps to avoid them.  Check your pulse regularly if your doctor recommends it. Place two fingers on the artery at the palm side of your wrist, in line with your thumb.  When should you call for help?   Call 911 anytime you think you may need emergency care. For example, call if:    You have symptoms of a heart attack. These may include:  Chest pain or pressure, or a strange feeling in the chest.  Sweating.  Shortness of breath.  Nausea or vomiting.  Pain, pressure, or a strange feeling in the back, neck, jaw, or upper belly or in one or both shoulders or arms.  Lightheadedness or sudden weakness.  A fast or irregular heartbeat.  After you call 911, the operator may tell you to chew 1 adult-strength or 2 to 4 low-dose aspirin. Wait for an ambulance. Do not try to drive yourself.     You have symptoms of a stroke. These may include:  Sudden numbness, tingling, weakness, or loss of movement in your face, arm, or leg, especially on only one side of your body.  Sudden vision changes.  Sudden trouble speaking.  Sudden confusion or trouble understanding simple statements.  Sudden  problems with walking or balance.  A sudden, severe headache that is different from past headaches.     You passed out (  lost consciousness).   Call your doctor now or seek immediate medical care if:    You have new or increased shortness of breath.     You feel dizzy or lightheaded, or you feel like you may faint.     You have an episode of atrial fibrillation and your doctor wants you to call when you have one.     You have new or worse symptoms.   Watch closely for changes in your health, and be sure to contact your doctor if you have any problems.  Where can you learn more?  Go to [email or web address] and enter U020 to learn more about "Atrial Fibrillation: Care Instructions."  Current as of: October 23, 2022  Content Version: 14.3   [address] [place].   Care instructions adapted under license by [facility]. If you have questions about a medical condition or this instruction, always ask your healthcare professional. Larene Beach, [facility], disclaims any warranty or liability for your use of this information.       Heart Failure: Care Instructions  Overview     Heart failure occurs when your heart does not pump as much blood as the body needs. Failure does not mean that the heart has stopped pumping but rather that it is not pumping as well as it should. Over time, this causes fluid buildup in your lungs and other parts of your body. Fluid buildup can cause shortness of breath, fatigue, swollen ankles, and other problems. Heart failure is treated with medicines, a heart-healthy lifestyle, and the steps you take to check your symptoms. Treatment can slow the disease, help you feel better, and help keep you out of the hospital. Treatment may also help you live longer.  Follow-up care is a key part of your treatment and safety. Be sure to make and go to all appointments, and call your doctor if you are having problems. It's also a good idea to know your test results and keep a list of the  medicines you take.  How can you care for yourself at home?  Medicines    Be safe with medicines. Take your medicines exactly as prescribed. Call your doctor if you think you are having a problem with your medicine.     You will get more details on the specific medicines your doctor prescribes. Medicines can help your heart work better, help you feel better, and help keep you out of the hospital. Medicines may also help you live longer.     Talk with your doctor or pharmacist before you take a new prescription or over-the-counter medicine. Ask if the medicine is safe for you to take. Some medicines can affect your heart and make heart failure worse. Others may keep your heart failure medicines from working right. Over-the-counter medicines that you may need to avoid include herbal supplements, vitamins, pain relievers called NSAIDs, antacids, laxatives, and cough, cold, flu, or sinus medicine.   Diet    Eat heart-healthy foods. These foods include vegetables, fruits, nuts, beans, lean meat, fish, and whole grains.     Your doctor may suggest that you limit sodium. Your doctor can tell you how much sodium is right for you. An example is less than 3,000 mg a day. This includes all the salt you eat in cooking or in packaged foods. People get most of their sodium from processed foods. Fast food and restaurant meals also tend to be very high in sodium.     Limit your fluid intake if  your doctor tells you to. Your doctor will tell you how much fluid you can have in a day.   Symptoms    Weigh yourself without clothing at the same time each day. Record your weight. Call your doctor if you have a sudden weight gain, such as more than 2 to 3 pounds in a day or 5 pounds in a week. (Your doctor may suggest a different range of weight gain.) A sudden weight gain may mean that your heart failure is getting worse.     Check your symptoms every day to watch for changes. Know what to do if your symptoms get worse.   Activity    Be  active. Do not start to exercise until you have talked with your doctor. Together you can make an exercise program that is enjoyable and safe for you. Regular exercise can make your heart and your body stronger. Being active can help you feel better too.     With your doctor, plan how often, how long, and how hard you will be active. Don't exercise too hard because it can put stress on your heart.     If your doctor has not set you up with a cardiac rehabilitation (rehab) program, ask if it's right for you. Cardiac rehab can give you education and support that help you stay as healthy as possible.     When you exercise, watch for signs that your heart is working too hard. You are pushing yourself too hard if you cannot talk while you are exercising. If you become short of breath or dizzy or have chest pain, stop, sit down, and rest.   Heart-healthy lifestyle    Do not smoke. Smoking can make a heart condition worse. If you need help quitting, talk to your doctor about stop-smoking programs and medicines. These can increase your chances of quitting for good. Quitting smoking may be the most important step you can take to protect your heart.     Stay at a healthy weight. Lose weight if you need to.     Manage other health problems such as diabetes and high blood pressure.     Limit or avoid alcohol. Ask your doctor how much alcohol, if any, is safe for you.     If you think you may have a problem with alcohol or drug use, talk to your doctor.     Avoid infections such as COVID-19, colds, and the flu. Get the flu vaccine every year. Get a pneumococcal vaccine shot. If you have had one before, ask your doctor whether you need another dose. Stay up to date on your COVID-19 vaccines.   When should you call for help?   Call 911  if you have symptoms of sudden heart failure such as:    You have severe trouble breathing.     You cough up pink, foamy mucus.     You have a new irregular or rapid heartbeat.   Call your doctor  now or seek immediate medical care if:    You have new or increased shortness of breath.     You are dizzy or lightheaded, or you feel like you may faint.     You have sudden weight gain, such as more than 2 to 3 pounds in a day or 5 pounds in a week. (Your doctor may suggest a different range of weight gain.)     You have increased swelling in your legs, ankles, or feet.  You are suddenly so tired or weak that you cannot do your usual activities.   Watch closely for changes in your health, and be sure to contact your doctor if you develop new symptoms.  Where can you learn more?  Go to [employer] and enter E597 to learn more about "Heart Failure: Care Instructions."  Current as of: October 23, 2022  Content Version: 14.3   [address], [place].   Care instructions adapted under license by [facility]. If you have questions about a medical condition or this instruction, always ask your healthcare professional. Larene Beach, [facility], disclaims any warranty or liability for your use of this information.    The patient is being discharged home in stable condition on a low saturated fat, low cholesterol and low salt diet. The patient is instructed to advance activities as tolerated to the limit of fatigue or shortness of breath. The patient is informed to monitor daily weights and maintain a 2 liter per day fluid restriction. The patient is instructed to call the office for any shortness of breath, weight gain, or increased peripheral edema.

## 2023-04-21 NOTE — Care Coordination-Inpatient (Signed)
Care Transitions Note    Initial Call - Call within 2 business days of discharge: Yes    Attempted to reach patient for transitions of care follow up. Unable to reach patient.    Outreach Attempts:   HIPAA compliant voicemail left for patient.     Patient: Chad Rosario   Patient DOB: 1955-11-30   MRN: [id number]    Reason for Admission: Congestive heart failure  Discharge Date: 04/19/23  RURS: Readmission Risk Score: 8.1    Last Discharge Facility       Date Complaint Diagnosis Description Type Department Provider    04/17/23  Congestive heart failure, unspecified HF chronicity, unspecified heart failure type (HCC) ... Admission (Discharged) [id number] [name], MD            Was this an external facility discharge? No    Follow Up Appointment:   Patient has hospital follow up appointment scheduled     Future Appointments         Provider Specialty Dept Phone    04/24/2023 9:30 AM [facility] Family Medicine [phone]    04/29/2023 10:45 AM [name], MD Cardiology [phone]    05/09/2023 10:30 AM Cynda Familia, MD Gastroenterology [phone]    05/13/2023 8:15 AM [name], MD Family Medicine [phone]    05/14/2023 9:30 AM Reuel Boom, MD Neurosurgery [phone]    08/08/2023 9:15 AM [name], MD Family Medicine [phone]    08/13/2023 12:00 PM Chesley Mires, MD Neurology [phone]    09/29/2023 8:20 AM Lance Sell, APRN - CNP Sleep Medicine [phone]            Plan for follow-up on next business day.      Otelia Santee, RN

## 2023-04-22 NOTE — Care Coordination-Inpatient (Signed)
Care Transitions Note    Initial Call - Call within 2 business days of discharge: Yes    Patient Current Location:  Home: [address] [name] [address]    Care Transition Nurse contacted the patient by telephone to perform post hospital discharge assessment, verified name and DOB as identifiers. Provided introduction to self, and explanation of the Care Transition Nurse role.   Patient reports he is working with his Cardiologist for weeping leg edema. Patient report he is monitoring his daily weights and compliant with his medications. Patient politely declines CTN services at this time, and he indicates he is able manage care at this time. Patient already has upcoming appointments scheduled, and he states he has contacts for his providers. CTN encouraged patient to reach out if needs arise.   Patient: Chad Rosario  Patient DOB: 1955/07/16   MRN: [id number]    Reason for Admission: Congestive heart failure, unspecified HF chronicity,   Discharge Date: 04/19/23  RURS: Readmission Risk Score: 8.1    Last Discharge Facility       Date Complaint Diagnosis Description Type Department Provider    04/17/23  Congestive heart failure, unspecified HF chronicity, unspecified heart failure type (HCC) ... Admission (Discharged) [id number] [name], MD        Was this an external facility discharge? No    Follow Up Appointment:   Discussed follow up appointments. Patient has hospital follow up appointment scheduled within 7 days of discharge.   Future Appointments         Provider Specialty Dept Phone    04/29/2023 10:45 AM [name], MD Cardiology [phone]    05/09/2023 10:30 AM Cynda Familia, MD Gastroenterology [phone]    05/13/2023 8:15 AM [name], MD Family Medicine [phone]    08/08/2023 9:15 AM [name], MD Family Medicine [phone]    08/13/2023 12:00 PM Chesley Mires, MD Neurology [phone]    09/29/2023 8:20 AM Lance Sell, APRN - CNP Sleep Medicine  [phone]        Otelia Santee, RN

## 2023-04-22 NOTE — Telephone Encounter (Signed)
Chad Hidden, MD  Bea Laura, RN  Caller: Unspecified (Today,  8:48 AM)  Nothing off the bat.  He was seen by my colleagues in the hospital.  His weeping skin may have noncardiac causes as well assuming that he was appropriately diuresed while hospitalized.  I would ask that he inquire with his PCP until he is seen with me soon.

## 2023-04-22 NOTE — Telephone Encounter (Signed)
Care Transitions Initial Follow Up Call    Call within 2 business days of discharge: Yes     Patient: Chad Rosario Patient DOB: Apr 07, 1955 MRN: [id number]      RARS: Readmission Risk Score: 8.1       Spoke with: patiet    Discharge department/facility[facility] [place]    Non-face-to-face services provided:    The patient is being discharged home in stable condition on a low saturated fat, low cholesterol and low salt diet. The patient is instructed to advance activities as tolerated to the limit of fatigue or shortness of breath.  The patient is informed to monitor daily weights and maintain a 2 liter per day fluid restriction.  The patient is instructed to call the office for any shortness of breath, weight gain, or increased peripheral edema.       All instruction reviewed. Pt voices understanding.    Follow Up  Future Appointments   Date Time Provider Department Center   04/29/2023 10:45 AM [name], MD UCDG [facility] AMB   05/09/2023 10:30 AM Robelin, Rae Lips, MD SFGE [facility] AMB   05/13/2023  8:15 AM [name], MD D[facility] ECC DEP   08/08/2023  9:15 AM [name], MD DFM [facility] ECC DEP   08/13/2023 12:00 PM Chesley Mires, MD BSNI [facility] AMB   09/29/2023  8:20 AM Lance Sell, APRN - CNP PSCD [facility] AMB       Jennye Moccasin, RN

## 2023-04-22 NOTE — Telephone Encounter (Signed)
See triage note.

## 2023-04-22 NOTE — Telephone Encounter (Signed)
Weeping edema  Doctor, hospital First)  View All Conversations on this Encounter  Randall An, [place] routed conversation to [facility] Cardiology [id number] minutes ago (8:14 AM)     [name]  P [facility] Cardiology Clinical Staff (supporting [name], MD)35 minutes ago (8:13 AM)       Hello - I have an appointment with you next week, but I wanted to let you know the weeping on my shin continues. I know I have circulation issues and was thinking I need to see a vein specialist. I have a vascular doctor now, but he has pretty much told me to see my neurologist because there's nothing he can do. Basically, he's not listening. So, if you think a referral is warranted, I was hoping to get the ball rolling. I need to resolve some of my issues by May 1.     Please share your thoughts.  Thank you.

## 2023-04-22 NOTE — Telephone Encounter (Signed)
Informed patient of Dr. Freddi Starr response. Patient voices understanding.

## 2023-04-24 ENCOUNTER — Encounter: Payer: MEDICARE | Primary: Family Medicine

## 2023-04-29 ENCOUNTER — Ambulatory Visit
Admit: 2023-04-29 | Discharge: 2023-04-29 | Payer: MEDICARE | Attending: Cardiovascular Disease | Primary: Family Medicine

## 2023-04-29 ENCOUNTER — Encounter

## 2023-04-29 VITALS — BP 128/70 | HR 68 | Ht 73.0 in | Wt 296.0 lb

## 2023-04-29 DIAGNOSIS — Z951 Presence of aortocoronary bypass graft: Secondary | ICD-10-CM

## 2023-04-29 LAB — CBC WITH AUTO DIFFERENTIAL
Basophils %: 0.9 % (ref 0.0–2.0)
Basophils Absolute: 0.07 10*3/uL (ref 0.00–0.20)
Eosinophils %: 9.3 % — ABNORMAL HIGH (ref 0.5–7.8)
Eosinophils Absolute: 0.7 10*3/uL (ref 0.00–0.80)
Hematocrit: 48.6 % (ref 41.1–50.3)
Hemoglobin: 16.7 g/dL (ref 13.6–17.2)
Immature Granulocytes %: 0.3 % (ref 0.0–5.0)
Immature Granulocytes Absolute: 0.02 10*3/uL (ref 0.0–0.5)
Lymphocytes %: 24 % (ref 13.0–44.0)
Lymphocytes Absolute: 1.81 10*3/uL (ref 0.50–4.60)
MCH: 31.1 pg (ref 26.1–32.9)
MCHC: 34.4 g/dL (ref 31.4–35.0)
MCV: 90.5 fL (ref 82–102)
MPV: 13 fL — ABNORMAL HIGH (ref 9.4–12.3)
Monocytes %: 9.7 % (ref 4.0–12.0)
Monocytes Absolute: 0.73 10*3/uL (ref 0.10–1.30)
Neutrophils %: 55.8 % (ref 43.0–78.0)
Neutrophils Absolute: 4.2 10*3/uL (ref 1.70–8.20)
Platelets: 75 10*3/uL — AB (ref 150–450)
RBC: 5.37 M/uL (ref 4.23–5.6)
RDW: 14.7 % — ABNORMAL HIGH (ref 11.9–14.6)
WBC: 7.5 10*3/uL (ref 4.3–11.1)
nRBC: 0 10*3/uL (ref 0.0–0.2)

## 2023-04-29 LAB — BASIC METABOLIC PANEL
Anion Gap: 11 mmol/L (ref 7–16)
BUN: 20 mg/dL (ref 8–23)
CO2: 23 mmol/L (ref 20–29)
Calcium: 9.3 mg/dL (ref 8.8–10.2)
Chloride: 102 mmol/L (ref 98–107)
Creatinine: 1.11 mg/dL (ref 0.80–1.30)
Est, Glom Filt Rate: 72 mL/min/{1.73_m2} (ref >60)
Glucose: 107 mg/dL — ABNORMAL HIGH (ref 70–99)
Potassium: 5 mmol/L (ref 3.5–5.1)
Sodium: 136 mmol/L (ref 136–145)

## 2023-04-29 LAB — MAGNESIUM: Magnesium: 1.8 mg/dL (ref 1.8–2.4)

## 2023-04-29 NOTE — Telephone Encounter (Signed)
-----   Message from Dr. Amelia Jo, MD sent at 04/29/2023  4:09 PM EST -----  Please the patient know that he has thrombocytopenia with a platelet count of 75,000, unknown etiology.  He follows with hematology.  Since he is on Xarelto (full dose), I reached out to his hematologist to see if it is safe to continue the medication.

## 2023-04-29 NOTE — Telephone Encounter (Signed)
Advised patient of platelet results and Dr. Hillard Danker response. Patient verbalized understanding.

## 2023-04-29 NOTE — Progress Notes (Signed)
 [facility] Follow Up                 Reason for Visit: CCD    Subjective:     Patient is a 68 y.o. male with a PMH of chronic HFpEF, atrial fibrillation, TAA, CAD status post CABG in 2015, statin intolerance, hypertension, hyperlipidemia, secondary erythrocytosis, chronic venous insufficiency and DVT who presents for follow-up. The patient was last seen in January 2025.  He presented to clinic at that time with DOE with an ECG consistent with AF with RVR.  AF was newly diagnosed.  He was directly admitted to the hospital.  He underwent IV diuresis with documented -5 L.  He had a TTE in January 2025 that noted a normal EF and mild MR.  His ascending aorta was noted to be 4 cm.  He was noted to have a moderately dilated LA.  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  He reports that his edema has improved.  He reports that he drinks about 3 liquor beverages about 3 times a week.    Past Medical History:   Diagnosis Date    CAD (coronary artery disease) 2015    Gout     Hypertension     controlled with meds    Neuropathy May 2019    Osteoarthritis 03/2013    Sleep apnea     cpap at night      Past Surgical History:   Procedure Laterality Date    BACK SURGERY  2021    has had 3 different ones last was in 2021    CARDIAC SURGERY      CABG    CHOLECYSTECTOMY      COLONOSCOPY N/A 11/29/2020    COLONOSCOPY POLYPECTOMY HOT SNARE performed by Drucie Opitz, MD at [facility] ENDOSCOPY    EYE SURGERY Left     cataract    FRACTURE SURGERY Left     tib-fib fracture    JOINT REPLACEMENT  Hip - 2018    KNEE ARTHROSCOPY Right     TOTAL HIP ARTHROPLASTY Left       Family History   Problem Relation Age of Onset    Cancer Mother         Lymp    Stroke Father     Cancer Sister         Breast    Clotting Disorder Maternal Grandfather       Social History     Tobacco Use    Smoking status: Never    Smokeless tobacco: Never   Substance Use Topics    Alcohol use: Yes      Alcohol/week: 12.0 standard drinks of alcohol     Types: 6 Cans of beer, 6 Shots of liquor per week     Comment: socially      Allergies   Allergen Reactions    Hydrocodone-Acetaminophen Anxiety and Itching    Losartan Shortness Of Breath    Amlodipine Other (See Comments)     Other reaction(s): Other (See Comments)  Fatigue   Fatigue       Rosuvastatin Other (See Comments)     Other reaction(s): Constipation  Fatigue, constipation  Other reaction(s): Fatigue    Atorvastatin Diarrhea    Ezetimibe      Other reaction(s): Constipation, Other (See Comments)  Constipation and decreased urine output  Constipation and decreased urine output  Other reaction(s): Constipation, Urine output low  ROS:  No obvious pertinent positives on review of systems except for what was outlined above.       Objective:       BP 128/70   Pulse 68   Ht 1.854 m (6\' 1" )   Wt 134.3 kg (296 lb)   BMI 39.05 kg/m     BP Readings from Last 3 Encounters:   04/29/23 128/70   04/19/23 124/78   04/17/23 (!) 142/80       Wt Readings from Last 3 Encounters:   04/29/23 134.3 kg (296 lb)   04/19/23 129.9 kg (286 lb 6.4 oz)   04/17/23 135.6 kg (299 lb)       General/Constitutional:   Alert and oriented x 3, no acute distress  HEENT:   normocephalic, atraumatic, moist mucous membranes  Neck:   No JVD or carotid bruits bilaterally  Cardiovascular:   IRIR, no rub/gallop appreciated  Pulmonary:   clear to auscultation bilaterally, no respiratory distress  Abdomen:   soft, non-tender, non-distended  Ext:   2+ LE edema bilaterally; right leg wrapped with Ace wrap  Skin:  warm and dry, no obvious rashes seen  Neuro:   no obvious sensory or motor deficits  Psychiatric:   normal mood and affect    ECG:   Atrial fibrillation  PRWP  Heart rate 84 bpm    Data Review:   Lab Results   Component Value Date    CHOL 144 03/11/2023    CHOL 182 08/07/2022    CHOL 169 02/07/2022     Lab Results   Component Value Date    TRIG 79 03/11/2023    TRIG 113 08/07/2022     TRIG 84 02/07/2022     Lab Results   Component Value Date    HDL 46 03/11/2023    HDL 45 08/07/2022    HDL 47 02/07/2022     No components found for: "LDLCHOLESTEROL", "LDLCALC"  Lab Results   Component Value Date    VLDL 16 03/11/2023    VLDL 23 08/07/2022    VLDL 16.1 02/07/2022     Lab Results   Component Value Date    CHOLHDLRATIO 3.1 03/11/2023    CHOLHDLRATIO 4.0 08/07/2022    CHOLHDLRATIO 3.6 02/07/2022        Lab Results   Component Value Date/Time    NA 136 04/19/2023 05:12 AM    NA 137 04/18/2023 05:43 AM    NA 137 04/17/2023 04:44 PM    K 4.6 04/19/2023 05:12 AM    K 4.3 04/18/2023 05:43 AM    K 4.6 04/17/2023 04:44 PM    CL 100 04/19/2023 05:12 AM    CL 103 04/18/2023 05:43 AM    CL 103 04/17/2023 04:44 PM    CO2 25 04/19/2023 05:12 AM    CO2 24 04/18/2023 05:43 AM    CO2 23 04/17/2023 04:44 PM    BUN 25 04/19/2023 05:12 AM    BUN 20 04/18/2023 05:43 AM    BUN 21 04/17/2023 04:44 PM    CREATININE 1.27 04/19/2023 05:12 AM    CREATININE 1.12 04/18/2023 05:43 AM    CREATININE 1.12 04/17/2023 04:44 PM    GLUCOSE 125 04/19/2023 05:12 AM    GLUCOSE 131 04/18/2023 05:43 AM    GLUCOSE 109 04/17/2023 04:44 PM    GLUCOSE 127 11/21/2021 09:42 AM    CALCIUM 9.0 04/19/2023 05:12 AM    CALCIUM 8.7 04/18/2023 05:43 AM    CALCIUM 9.3 04/17/2023 04:44  PM         Lab Results   Component Value Date    ALT 43 04/17/2023    ALT 38 04/03/2023    ALT 24 03/11/2023    AST 48 (H) 04/17/2023    AST 44 (H) 04/03/2023    AST 26 03/11/2023        Assessment/Plan:   1. Persistent atrial fibrillation (HCC)  - Unclear duration having developed between April 2024 in January 2025  - CHA2DS2-VASc equals 4   - Patient "younger" with possibly shorter history of AF (albeit moderate LA enlargement noted on TTE in January 2025) with potentially more symptoms (HFpEF with recent admission for diuresis) such that a rhythm control strategy is reasonable  - Obtain a TEE +/- DCCV  - Refer to EP for further management  - Continue with Toprol-XL   -  Continue with Xarelto pending a CBC (patient with a history of thrombocytopenia) with dosing pending a chemistry profile  - Obtain a CBC and a chemistry profile  - Educated the patient on decreasing alcohol intake and weight reduction    2. Thoracic aortic aneurysm without rupture, unspecified part (HCC)  - 4 cm TAA noted on TTE in January 2025  - Follow-up TEE dimension    3. Hx of CABG  4. Hyperlipidemia, unspecified hyperlipidemia type  - In patients with AF and CCD (beyond 1 year after revascularization or CAD not requiring coronary revascularization) without history of stent thrombosis, oral anticoagulation monotherapy is recommended over the combination therapy of OAC and single APT (aspirin or P2Y12 inhibitor) to decrease the risk of major bleeding (class I recommendation; 2023 ACC guidelines for AF)  - History of statin intolerance    5. Hypertension, unspecified type  - Well-controlled  - Continue with Toprol-XL  - Currently on lisinopril    6. Chronic heart failure with preserved ejection fraction (HFpEF) (HCC)  - H2FPEF score = 8 diagnostic of HFpEF (heavy, hypertension, atrial fibrillation, elder, filling pressure)   - Currently on p.o. Lasix as needed  - Instructed the patient to take a dose of Lasix for weight gain of 2 lbs in two days or 5 lbs in one week and return to as needed dosing once the weight returns to baseline    7. Obesity (BMI 30-39.9)   - Consider GLP-1 agonist: Defer to PCP    Time spent on this encounter: 40 minutes    F/U: 3 months    [name], MD

## 2023-05-08 ENCOUNTER — Inpatient Hospital Stay: Admit: 2023-05-08 | Discharge: 2023-05-14 | Payer: MEDICARE | Primary: Family Medicine

## 2023-05-08 ENCOUNTER — Ambulatory Visit: Payer: MEDICARE | Primary: Family Medicine

## 2023-05-08 DIAGNOSIS — I82501 Chronic embolism and thrombosis of unspecified deep veins of right lower extremity: Secondary | ICD-10-CM

## 2023-05-09 ENCOUNTER — Inpatient Hospital Stay
Admit: 2023-05-09 | Discharge: 2023-05-15 | Payer: MEDICARE | Attending: Cardiovascular Disease | Primary: Family Medicine

## 2023-05-09 ENCOUNTER — Encounter

## 2023-05-09 ENCOUNTER — Encounter: Payer: MEDICARE | Primary: Family Medicine

## 2023-05-09 VITALS — BP 117/78 | HR 73 | Temp 98.00000°F | Resp 14 | Ht 73.0 in | Wt 280.0 lb

## 2023-05-09 DIAGNOSIS — I4819 Other persistent atrial fibrillation: Secondary | ICD-10-CM

## 2023-05-09 LAB — EKG 12-LEAD
Atrial Rate: 72 {beats}/min
Diagnosis: NORMAL
P Axis: 51 degrees
P-R Interval: 186 ms
Q-T Interval: 356 ms
Q-T Interval: 396 ms
QRS Duration: 86 ms
QRS Duration: 88 ms
QTc Calculation (Bazett): 466 ms
QTc Calculation (Bazett): 433 ms
R Axis: -35 degrees
R Axis: -49 degrees
T Axis: 80 degrees
T Axis: 44 degrees
Ventricular Rate: 103 {beats}/min
Ventricular Rate: 72 {beats}/min

## 2023-05-09 LAB — TEE W/ POSSIBLE CARDIOVERSION (PRN CONTRAST/BUBBLE/3D)
Ao Root Index: 1.33 cm/m2
Aortic Root: 3.3 cm
Ascending Aorta Index: 1.61 cm/m2
Ascending Aorta: 4 cm
Body Surface Area: 2.56 m2

## 2023-05-09 LAB — PLATELET COUNT: Platelets: 84 10*3/uL — ABNORMAL LOW (ref 150–450)

## 2023-05-09 MED ORDER — MIDAZOLAM HCL 5 MG/5ML IJ SOLN
55 | INTRAMUSCULAR | Status: AC
Start: 2023-05-09 — End: ?

## 2023-05-09 MED ORDER — FENTANYL CITRATE (PF) 100 MCG/2ML IJ SOLN
1002 | INTRAMUSCULAR | Status: AC
Start: 2023-05-09 — End: ?

## 2023-05-09 MED ORDER — LISINOPRIL 20 MG PO TABS
20 | ORAL_TABLET | Freq: Every day | ORAL | 1 refills | Status: DC
Start: 2023-05-09 — End: 2024-02-17

## 2023-05-09 MED ORDER — LIDOCAINE VISCOUS HCL 2 % MT SOLN
2 | OROMUCOSAL | Status: AC
Start: 2023-05-09 — End: ?

## 2023-05-09 MED ORDER — FENTANYL 0.05 MG/ML SOLN (MIXTURES ONLY)
0.05 | Status: AC | PRN
Start: 2023-05-09 — End: 2023-05-09
  Administered 2023-05-09: 13:00:00 25 via INTRAVENOUS
  Administered 2023-05-09: 13:00:00 50 via INTRAVENOUS
  Administered 2023-05-09: 13:00:00 25 via INTRAVENOUS

## 2023-05-09 MED ORDER — MIDAZOLAM HCL (PF) 2 MG/2ML IJ SOLN
22 | INTRAMUSCULAR | Status: AC | PRN
Start: 2023-05-09 — End: 2023-05-09
  Administered 2023-05-09 (×2): 1 via INTRAVENOUS
  Administered 2023-05-09 (×2): 2 via INTRAVENOUS
  Administered 2023-05-09: 13:00:00 1 via INTRAVENOUS

## 2023-05-09 MED FILL — FENTANYL CITRATE (PF) 100 MCG/2ML IJ SOLN: 1002 MCG/2ML | INTRAMUSCULAR | Qty: 2

## 2023-05-09 MED FILL — LIDOCAINE VISCOUS HCL 2 % MT SOLN: 2 % | OROMUCOSAL | Qty: 15

## 2023-05-09 MED FILL — MIDAZOLAM HCL 5 MG/5ML IJ SOLN: 55 MG/ML | INTRAMUSCULAR | Qty: 10

## 2023-05-09 NOTE — Progress Notes (Signed)
 Discharge instructions given per orders, voiced good understanding of  care, medications & follow up care. Denies any questions

## 2023-05-09 NOTE — Progress Notes (Signed)
Patient pre-assessment complete for TEE/CVN scheduled for 0800, arrival time 0700. Patient verified using DOB. Patient instructed to bring a list of all home medications on the day of procedure. NPO status reinforced. Patient instructed to HOLD lasix. Instructed they can take all other medications excluding vitamins & supplements. Patient verbalizes understanding of all instructions & denies any questions at this time.

## 2023-05-09 NOTE — Procedures (Signed)
 PROCEDURE NOTE  Date: 05/09/2023   Name: Chad Rosario  Date of Birth: [birth year]    Procedures TEE and cardioversion:    -Indication: Persistent symptomatic rapid atrial fibrillation    After informed consent, cardioversion patches were placed over the anterior and posterior chest wall and with the help of a total of 7 mg of Versed and 100 mcg of fentanyl, moderate conscious sedation was achieved.  Complete TEE was done which showed preserved LV function with an EF at 55 to 60%, mild MR and mild TR, moderate to severe left atrial enlargement and moderate right atrial enlargement, no evidence of left atrial appendage thrombus.  -A single 360 J biphasic synchronized shock was used successfully to get him back in sinus rhythm.    Plan: The only change we made with his meds was to increase lisinopril to 40 mg daily for better control of blood pressures.  He will continue CPAP therapy.  Needs to follow-up with EP-good sinus rhythm noted following his cardioversion and he would benefit potentially from consideration of A-fib ablation.  Sedation was started at 0806 hours and ended at 0831 hrs.    Sincerely,  Waynetta Sandy MD

## 2023-05-09 NOTE — Discharge Instructions (Addendum)
 Discharge Instructions for Cardioversion    Your healthcare provider performed a procedure called cardioversion. Your healthcare provider used a controlled electric shock or a medicine to briefly stop all electrical activity in your heart. This helped restore your heart's normal rhythm. Here are some instructions to follow while you recover.    Home care  Because cardioversion typically requires sedation, you won't be able to drive home. You will need a ride. Wait at least 24 hours before driving a car or operating heavy machinery after receiving sedating medicines.    Don't be alarmed if the skin on your chest is irritated or feels like it is sunburned. Your healthcare provider may prescribe a soothing lotion to relieve this discomfort. These minor symptoms will go away in a few days.    Ask your healthcare provider about medicines to keep your heart rhythm steady.    If you were prescribed medicine, take it as instructed by your healthcare provider. Don't skip doses or take double doses. Cardioversion requires blood thinners for at least 4 weeks to prevent a delayed risk of stroke when treating atrial fibrillation or atrial flutter. Be sure you discuss which medicine you are taking to prevent stroke. Ask when you need to have your medicine levels checked, and whether you may be able to stop taking it in the future or whether it is recommended that you take it for life. Some of these blood-thinning medicines will have the dose adjusted and interact with other medicines or foods. Your healthcare team will give you full instructions on what to watch out for. Report bleeding or symptoms of stroke immediately to your healthcare team and seek emergency medical attention.    Learn to take your own pulse. Keep a record of your results. Ask your healthcare provider when you should seek emergency medical attention. He or she will tell you which pulse rate reading is dangerous.     Keep in mind this procedure may need to be  repeated if the abnormal heart rhythm returns. After the procedure, your healthcare provider will tell you if the treatment worked or if you will need further treatments or medication.    Follow-up Care  Make a follow-up appointment, or as directed.    Call 911  Call 911 right away if you have:    Chest pain    Shortness of breath    Loss of vision, speech, or strength or coordination in any body part    When to call your healthcare provider  Call your healthcare provider right away if you:    Feel faint, dizzy, or lightheaded    Have chest pain with increased activity    Have irregular heartbeat or fast pulse    Have bleeding issues from blood-thinning medicines.     AFTER YOU TRANSESOPHAGEAL ECHOCARDIOGRAM    Be sure someone else drives you home. You may feel drowsy for several hours.    Do not eat or drink for at least two hours after your procedure. Your throat will be numb and there is a risk you might have difficulty swallowing for a while. Be careful when you do eat or drink for the first time especially with hot fluids since you could easily burn your throat.    YOU MAY EAT AND DRINK AFTER 10:15AM    Call your doctor if:    You are bleeding from your throat or mouth.  You have trouble breathing all of a sudden.  You have chest pain or any pain  that spreads to your neck, jaw, or arms.  You have questions or concerns.  You have a fever greater than 101F.    Doctor: Benancio Deeds    Special Instructions:    No driving for 24 hours.

## 2023-05-09 NOTE — Progress Notes (Signed)
 Patient received to CPRU room # 16  Ambulatory from lobby. Patient scheduled for TEE/CVN today with Dr Tawni Carnes. Procedure reviewed & questions answered, voiced good understanding consent obtained & placed on chart. All medications and medical history reviewed. Will prep patient per orders. Patient & family updated on plan of care.      The patient has a fraility score of 5-MILDLY FRAIL, based on ambulation.

## 2023-05-12 NOTE — Telephone Encounter (Signed)
 I called patient and left voicemail updating him that his duplex venous ultrasound does not show any concerning changes but some chronic clot in the right lower extremity similar to prior imaging.  No DVT in the left lower extremity.      Unsure why he was not scheduled for the arterial ultrasound the same day but do see it is scheduled for 2/26 and we will follow-up with patient after we see these results.

## 2023-05-13 ENCOUNTER — Ambulatory Visit: Admit: 2023-05-13 | Discharge: 2023-05-13 | Payer: MEDICARE | Attending: Family Medicine | Primary: Family Medicine

## 2023-05-13 ENCOUNTER — Encounter

## 2023-05-13 VITALS — BP 120/70 | HR 87 | Temp 98.30000°F | Ht 73.0 in | Wt 294.2 lb

## 2023-05-13 DIAGNOSIS — I4819 Other persistent atrial fibrillation: Secondary | ICD-10-CM

## 2023-05-13 DIAGNOSIS — F5101 Primary insomnia: Secondary | ICD-10-CM

## 2023-05-13 MED ORDER — ZOLPIDEM TARTRATE 5 MG PO TABS
5 | ORAL_TABLET | Freq: Every evening | ORAL | 2 refills | Status: DC | PRN
Start: 2023-05-13 — End: 2023-09-24

## 2023-05-13 NOTE — Telephone Encounter (Signed)
 Dr.Dowdy,  Did you ever hear back from his hematologist?      SEE NOTE 04/29/23  Message from Dr. Amelia Jo, MD sent at 04/29/2023 4:09 PM EST -----   Please the patient know that he has thrombocytopenia with a platelet count of 75,000, unknown etiology.  He follows with hematology.  Since he is on Xarelto (full dose), I reached out to his hematologist to see if it is safe to continue the medication.

## 2023-05-14 ENCOUNTER — Encounter: Payer: MEDICARE | Attending: Neurological Surgery | Primary: Family Medicine

## 2023-05-14 NOTE — Telephone Encounter (Signed)
 [name], MD  You14 hours ago (5:35 PM)       No.  I placed a STAT referral to address the question formally.

## 2023-05-14 NOTE — Telephone Encounter (Signed)
 Patient is an established patient with Dr. Karie Mainland. He was last seen 04/08/23.    Per Dr. Amelia Jo - schedule follow up with Dr. Karie Mainland for Thrombocytopenia .    Once patient is scheduled please assign referral # [id number]  to appointment. Thank you!

## 2023-05-14 NOTE — Telephone Encounter (Addendum)
 Dr.Dowdy,  He says that he already sees Dr.Ali w/[facility] so no need for new referral.Pt.is under the impression the big question was why hematology has him on Xarelto.He says that from his understanding Dr.Dowdy just wanted that question answered.He can call and make a fu w/hematology but Dr.Ali is already aware of his condition.

## 2023-05-14 NOTE — Telephone Encounter (Signed)
 Patient is returning call  [phone]   Call after 2 PM

## 2023-05-14 NOTE — Telephone Encounter (Signed)
 Called patient and left voicemail instructing patient to return call to get scheduled

## 2023-05-15 NOTE — Telephone Encounter (Signed)
 Chart reviewed by Dr. Karie Mainland.  Returned call to [employer].  Informed, per Dr. Karie Mainland, Okay to continue as long as platelet count above 50,000. It would be a good idea to check CBC monthly to ensure stability. Verbalized understanding.

## 2023-05-15 NOTE — Telephone Encounter (Signed)
 Physician provider: Dr. Karie Mainland  Reason for today's call (Please detail here patients chief complaint): Xarelto question  Last office visit:n/a  Patient Callback Number: [phone] opt 4 returning call to [facility] (Two Lynn's in office)  Was callback number verified?: Yes  Preferred pharmacy (If refill request): n/a  Calls to office within the last 48 hours?:No    Patient notified that their information will be routed to the [facility] clinical triage team for review. Patient is advised that they will receive a phone call from the triage department. If symptom related and symptoms worsen before receiving a call back, the patient has been advised to proceed to the nearest ED.          Larita Fife from [facility]iology would like to know if it okay for the pt to continue taking Xarelto because platelet count is 75,000. Dr. Freddi Starr would like to  know if it safe to continue the Xarelto    [phone] opt 4 returning call to [facility] (There are two Lynn's in the office)

## 2023-05-15 NOTE — Telephone Encounter (Signed)
 Message routed to Dr. Ma Hillock team.

## 2023-05-15 NOTE — Telephone Encounter (Signed)
 Per Dr.Ali:Okay to continue Xarleto as long as PLT count above 50,000 and it would be a good idea to check a CBC monthly to insure stability.

## 2023-05-15 NOTE — Telephone Encounter (Signed)
 Chad Hidden, MD  You14 hours ago (5:44 PM)       I didn't get a response from him through the EMR.  This unsuccessful communication attempt warrants an urgent outpatient visit with hematology to address the question.  One way or the other the question needs to be addressed.  The easier path was unsuccessful.   I called and informed pt.of MD response.He will send a message to hematology via mychart.He does not want to make a fu appt.    I called Hematology and they will forward a note to the triage and have triage call me back.

## 2023-05-16 NOTE — Telephone Encounter (Signed)
 [name], MD  You15 hours ago (5:38 PM)       Monthly CBC's are unusually frequent and such a recommended duration is a bit arbitrary.  Please arrange for him to see one of our Watchman operators.  He can see me sooner without overbooking to discuss Watchman a little as well.

## 2023-05-16 NOTE — Telephone Encounter (Signed)
 Dr.Dowdy,  He has a consult w/Dr.Senfield coming up should I change and have him see Dr.Sellers  instead so he can discuss Watchman and persistent Afib?

## 2023-05-16 NOTE — Telephone Encounter (Signed)
 Patient said please call him back I think the note he is calling about is closed

## 2023-05-16 NOTE — Telephone Encounter (Signed)
 I had an open note on pt.I called him.

## 2023-05-17 NOTE — Progress Notes (Signed)
 Chad Rosario  1955-10-01 is a 68 y.o. male ,Established patient, here for evaluation of the following chief complaint(s):   Chief Complaint   Patient presents with    Follow-up     2 month-- pt is not fasting----he has had a lot of blood work done recently-- had shoulder replaced got infected had to have it done again then went into a Fib--          1. Persistent atrial fibrillation (HCC)  Assessment & Plan:   Monitored by specialist- no acute findings meriting change in the plan  2. Primary insomnia  -     zolpidem (AMBIEN) 5 MG tablet; Take 1 tablet by mouth nightly as needed for Sleep for up to 90 days. Max Daily Amount: 5 mg, Disp-30 tablet, R-2Normal        Return in about 3 months (around 08/10/2023).        Subjective   SUBJECTIVE/OBJECTIVE:  Insomnia  This is a chronic problem. The current episode started more than 1 year ago. The problem occurs daily. The problem has been unchanged. Pertinent negatives include no abdominal pain, chest pain, chills, coughing, fever, headaches, joint swelling, myalgias, nausea, rash, sore throat or weakness.   Hypertension  This is a chronic problem. The current episode started more than 1 year ago. The problem has been gradually improving since onset. Pertinent negatives include no chest pain, headaches, palpitations or shortness of breath.       Review of Systems   Constitutional:  Negative for chills and fever.   HENT:  Negative for ear pain, hearing loss and sore throat.    Eyes:  Negative for photophobia and pain.   Respiratory:  Negative for cough and shortness of breath.    Cardiovascular:  Negative for chest pain, palpitations and leg swelling.   Gastrointestinal:  Negative for abdominal distention, abdominal pain, blood in stool and nausea.   Genitourinary:  Negative for dysuria and urgency.   Musculoskeletal:  Negative for joint swelling and myalgias.   Skin:  Negative for color change, pallor and rash.   Neurological:  Negative for speech difficulty,  weakness, light-headedness and headaches.   Hematological:  Negative for adenopathy.   Psychiatric/Behavioral:  Negative for agitation, confusion, hallucinations, self-injury and suicidal ideas. The patient has insomnia.           Objective   Physical Exam  Constitutional:       Appearance: Normal appearance.   HENT:      Head: Normocephalic and atraumatic.      Nose: Nose normal.   Eyes:      Extraocular Movements: Extraocular movements intact.      Conjunctiva/sclera: Conjunctivae normal.      Pupils: Pupils are equal, round, and reactive to light.   Cardiovascular:      Rate and Rhythm: Normal rate and regular rhythm.      Pulses: Normal pulses.      Heart sounds: Normal heart sounds.   Pulmonary:      Effort: Pulmonary effort is normal.      Breath sounds: Normal breath sounds.   Abdominal:      General: Abdomen is flat. Bowel sounds are normal.      Palpations: Abdomen is soft.   Skin:     General: Skin is warm and dry.   Neurological:      General: No focal deficit present.      Mental Status: He is alert and oriented to person, place, and time.  Psychiatric:         Mood and Affect: Mood normal.                   An electronic signature was used to authenticate this note.    --[name], MD

## 2023-05-17 NOTE — Assessment & Plan Note (Signed)
 Monitored by specialist- no acute findings meriting change in the plan

## 2023-05-19 NOTE — Telephone Encounter (Addendum)
 Dr.Dowdy,  He says he is SOB,feels like he may be back out of rhythm.Feels like he did prior to TEE.His legs get quickly fatigues.He is not able to do his normal activity.Current BP 150/97 hr=92.If he walks 20 feet he is out of breath.

## 2023-05-19 NOTE — Telephone Encounter (Addendum)
 Chad Hidden, MD  You1 hour ago (8:57 AM)       The best thing for him to do would be go to the ER for profound DOE and his concern for AF.  If DCCV is deemed appropriate, it would occur more quickly with that approach if admitted.   I spoke w/pt.he v/u.      Pt.scheduled 3/24 w/EP

## 2023-05-19 NOTE — Telephone Encounter (Addendum)
 I spoke w/Chad Rosario in the EP dept.and she said Dr.Senfield is going to start doing Watchman's as well and we can just leave pt.scheduled w/Dr.Senfield as planned.I also sent a message to [name] notifying her of this.Pt.also notified.

## 2023-05-20 ENCOUNTER — Inpatient Hospital Stay: Admit: 2023-05-20 | Discharge: 2023-05-20 | Payer: MEDICARE | Primary: Family Medicine

## 2023-05-20 VITALS — BP 134/75 | HR 59 | Ht 73.0 in | Wt 289.0 lb

## 2023-05-20 DIAGNOSIS — I4819 Other persistent atrial fibrillation: Secondary | ICD-10-CM

## 2023-05-20 DIAGNOSIS — S81801A Unspecified open wound, right lower leg, initial encounter: Secondary | ICD-10-CM

## 2023-05-20 NOTE — Progress Notes (Signed)
 [facility]  History and Physical Note   Referring Provider: Maryellen Pile, MD     Reshad Saab [facility]  MEDICAL RECORD NUMBER: [id number]  AGE: 68 y.o.     GENDER: male    DOB: Jun 09, 1955  EPISODE DATE: 05/20/2023    Assessment and Plan:     Problem List Items Addressed This Visit          Other    * (Principal) Wound dehiscence, surgical - Primary (Chronic)     Assessment  SP Mohs procedure; surgical site sutures ruptured immediately post op, likely due to swelling  History of swelling; does not wear compression  Wound bed is pink tissue with slough; no localized edema or periwound erythema to suggest infection  Plan  Excisional debridement remove devitalized tissue and promote wound healing  Wound care: Vashe cleanse, Hydrofera Blue, change three times weekly; Tubigrip for compression  RTC 1 week         Lower extremity edema     Medical decision making:  Assessment required other independent historian(s): N/A.  Comorbid conditions affecting wound healing as noted in PMH and PSH reviewed for this visit.  Review of medical records and external note from other providers reviewed for this visit.  Pertinent diagnostics were reviewed for this visit. Discussed management or test interpretation with other qualified health care professional.  Prescription drug management: N/A.    Risk of complications and/or mortality of treatment plan:  The patient has a moderate risk of morbidity and mortality from additional diagnostic testing or treatment. This is due to conditions that affect wound healing as well as patient and procedure risk factors. Patient/caregiver educated and given the opportunity to ask questions. They agree to treatment plan. Pertinent decisions include: minor surgery or procedures as below.   The patient's diagnosis and/or treatment is significantly limited by the following social drivers of health: N/A.    Procedure(s) performed this visit:  Procedure Note:  Excisional Debridement  Indications: Based on my examination of the patient's wound(s) today, debridement is required to remove devitalized tissue, evaluate the wound base, and promote wound healing.  Performed by: Marcene Duos, APRN - NP  Consent obtained: Yes  Time out taken: Yes  Pain control:     Wound #: 1  Diabetic/pressure/chronic non-pressure ulcers only: non-pressure ulcer, fat layer exposed was/were debrided using curette, scissors, and forceps. The wound(s) was/were debrided down through/to subcutaneous tissue. Devitalized tissue debrided: slough.  Pre and post debridement measurements: see flow sheet  Total surface area debrided: 7.8 sq cm   Estimated blood loss: minimal amount blood loss  Hemostasis achieved: By pressure  Procedural pain: 0 / 10   Post procedural pain: 0 / 10   Response to treatment: Patient tolerated procedure well with no complaints of pain.    Discussed with patient/caregiver factors that negatively affect wound healing: swelling and infection. Discussed with patient/caregiver ways to modify these factors: compression and keeping wound clean and covered. Discussed the benefit of serial debridements. Discussed signs of infection (warmth, redness, swelling, pain, fever >100.5, body aches, chills, loss of appetite, etc.), how and when to contact clinic, when to seek treatment in the ED. Questions asked and answered. Written instructions provided to patient/caregiver. Patient/caregiver voiced understanding of the importance of adhering to instructions for wound healing/best outcome.     Wound 06/07/21 Knee Right;Posterior (Active)   Number of days: 713       Wound 05/20/23 Pretibial Right;Lower #1 (Active)  Wound Image    05/20/23 1413   Wound Etiology Other 05/20/23 1413   Dressing Status New dressing applied 05/22/23 0400   Wound Cleansed Wound cleanser 05/22/23 0400   Dressing/Treatment Xeroform;Gauze dressing/dressing sponge;Ace wrap 05/22/23 0400   Wound Length (cm) 2.6 cm  05/20/23 1413   Wound Width (cm) 3 cm 05/20/23 1413   Wound Depth (cm) 0.2 cm 05/20/23 1413   Wound Surface Area (cm^2) 7.8 cm^2 05/20/23 1413   Wound Volume (cm^3) 1.56 cm^3 05/20/23 1413   Post-Procedure Length (cm) 2.6 cm 05/20/23 1413   Post-Procedure Width (cm) 3 cm 05/20/23 1413   Post-Procedure Depth (cm) 0.2 cm 05/20/23 1413   Post-Procedure Surface Area (cm^2) 7.8 cm^2 05/20/23 1413   Post-Procedure Volume (cm^3) 1.56 cm^3 05/20/23 1413   Wound Assessment Pink/red;Slough 05/22/23 0400   Drainage Amount Small (< 25%) 05/22/23 0400   Drainage Description Serosanguinous 05/22/23 0400   Odor None 05/22/23 0400   Peri-wound Assessment Edematous;Blanchable erythema;Hemosiderin staining (brown yellow) 05/22/23 0400   Wound Thickness Description not for Pressure Injury Full thickness 05/22/23 0400   Number of days: 1         Subjective:     Chief Complaint   Patient presents with    Wound Check     RLE       History of Present Illness   Chad Rosario is a 68 y.o. male who presents today for initial evaluation of a wound. The patient is new to the wound center. He presents alone. The wound(s) has/have been present for 2 week(s). Patient is SP Mohs procedure. Surgical site was sutured and dehisced as soon as patient stood from exam table. He was referred to wound care for ongoing management of this nonhealing wound. He does have chronic edema and he does not consistently wear compression. The patient  has a past medical history of CAD (coronary artery disease), Gout, Hypertension, Neuropathy, Osteoarthritis, and Sleep apnea. He is a never smoker. He reports drainage and swelling. He denies redness, body aches, fever, and chills. VSS.    Review of Systems   Constitutional:  Negative for appetite change, chills and fever.   Cardiovascular:  Positive for leg swelling.   Gastrointestinal:  Negative for nausea and vomiting.   Musculoskeletal:  Positive for gait problem (cane).   Skin:  Positive for wound.    Allergic/Immunologic: Negative for immunocompromised state.   Neurological:  Negative for numbness.   Hematological:  Bruises/bleeds easily (Xarelto).      PAST MEDICAL HISTORY      Diagnosis Date    CAD (coronary artery disease) 2015    Gout     Hypertension     controlled with meds    Neuropathy May 2019    Osteoarthritis 03/2013    Sleep apnea     cpap at night     PAST SURGICAL HISTORY  Past Surgical History:   Procedure Laterality Date    BACK SURGERY  2021    has had 3 different ones last was in 2021    CARDIAC SURGERY      CABG    CHOLECYSTECTOMY      COLONOSCOPY N/A 11/29/2020    COLONOSCOPY POLYPECTOMY HOT SNARE performed by Drucie Opitz, MD at [facility] ENDOSCOPY    EYE SURGERY Left     cataract    FRACTURE SURGERY Left     tib-fib fracture    JOINT REPLACEMENT  Hip - 2018    KNEE ARTHROSCOPY Right  TOTAL HIP ARTHROPLASTY Left      FAMILY HISTORY  Family History   Problem Relation Age of Onset    Cancer Mother         Lymp    Stroke Father     Cancer Sister         Breast    Clotting Disorder Maternal Grandfather      SOCIAL HISTORY  Social History     Tobacco Use    Smoking status: Never    Smokeless tobacco: Never   Vaping Use    Vaping status: Never Used   Substance Use Topics    Alcohol use: Yes     Alcohol/week: 12.0 standard drinks of alcohol     Types: 6 Cans of beer, 6 Shots of liquor per week     Comment: socially    Drug use: Never     ALLERGIES  Allergies   Allergen Reactions    Hydrocodone-Acetaminophen Anxiety and Itching    Losartan Shortness Of Breath    Amlodipine Other (See Comments)     Other reaction(s): Other (See Comments)  Fatigue   Fatigue       Rosuvastatin Other (See Comments)     Other reaction(s): Constipation  Fatigue, constipation  Other reaction(s): Fatigue    Atorvastatin Diarrhea    Ezetimibe      Other reaction(s): Constipation, Other (See Comments)  Constipation and decreased urine output  Constipation and decreased urine output  Other reaction(s): Constipation, Urine output  low       MEDICATIONS  No current facility-administered medications on file prior to encounter.     Current Outpatient Medications on File Prior to Encounter   Medication Sig Dispense Refill    zolpidem (AMBIEN) 5 MG tablet Take 1 tablet by mouth nightly as needed for Sleep for up to 90 days. Max Daily Amount: 5 mg 30 tablet 2    lisinopril (PRINIVIL;ZESTRIL) 20 MG tablet Take 2 tablets by mouth daily 180 tablet 1    PYRIDOXINE HCL PO Take by mouth every morning (Patient not taking: Reported on 05/21/2023)      rivaroxaban (XARELTO) 20 MG TABS tablet Take 1 tablet by mouth daily (with breakfast) 30 tablet 11    furosemide (LASIX) 40 MG tablet Take 1 tablet by mouth daily as needed (take one tablet daily for weight garin greater than 2 lbs in 24 hours or 4 lbs in 48 hours.) 30 tablet 3    metoprolol succinate (TOPROL XL) 50 MG extended release tablet Take 1 tablet by mouth 2 times daily 30 tablet 3    doxycycline hyclate (VIBRAMYCIN) 100 MG capsule Take 1 capsule by mouth 2 times daily      Cyanocobalamin (VITAMIN B 12 PO) Take by mouth every morning      allopurinol (ZYLOPRIM) 300 MG tablet TAKE 1 TABLET BY MOUTH DAILY 90 tablet 3    gabapentin (NEURONTIN) 600 MG tablet Take 1 tablet by mouth in the morning, at noon, in the evening, and at bedtime. (Patient taking differently: Take 1 tablet by mouth in the morning, at noon, in the evening, and at bedtime. Patient taking tid) 360 tablet 3    Multiple Vitamins-Minerals (THERAPEUTIC MULTIVITAMIN-MINERALS) tablet Take 1 tablet by mouth daily      vitamin D3 (CHOLECALCIFEROL) 10 MCG (400 UNIT) TABS tablet Take 2 tablets by mouth daily      CPAP Machine MISC by Does not apply route      colchicine (MITIGARE) 0.6 MG capsule  Take 1 capsule by mouth as needed for Pain 30 capsule 3    Alpha-Lipoic Acid 200 MG CAPS Take 300 mg by mouth (Patient not taking: Reported on 05/21/2023)      fexofenadine (ALLEGRA) 180 MG tablet Take 1 tablet by mouth daily       Objective:    BP 134/75    Pulse 59   Ht 1.854 m (6\' 1" )   Wt 131.1 kg (289 lb)   BMI 38.13 kg/m   Wt Readings from Last 3 Encounters:   05/22/23 129.8 kg (286 lb 3.2 oz)   05/20/23 131.1 kg (289 lb)   05/13/23 133.4 kg (294 lb 3.2 oz)     Physical Exam  Vitals and nursing note reviewed.   Constitutional:       General: He is not in acute distress.     Appearance: Normal appearance. He is not toxic-appearing.   Cardiovascular:      Rate and Rhythm: Normal rate.      Pulses: Normal pulses.   Pulmonary:      Effort: Pulmonary effort is normal.   Musculoskeletal:      Right lower leg: Edema present.      Left lower leg: No edema.   Skin:     General: Skin is warm and dry.      Findings: Wound (right anterior lower leg) present.   Neurological:      Mental Status: He is alert. Mental status is at baseline.   Psychiatric:         Mood and Affect: Mood normal.         Behavior: Behavior normal.     On this date 05/22/23, I have spent 30 minutes reviewing the patient's chart (previous notes, test results, etc.) and face to face with them discussing their diagnosis and treatment plan (offloading/pressure relief measures, nutrition requirements for wound healing, smoking cessation (when applicable), infection risk, etc.), documenting the visit, and coordinating their care. Chart review, face to face encounter, and documentation were all completed on the date of service.     This dictation may have been generated in part by voice recognition computer software. Although all attempts are made to edit the dictation for accuracy, there may be errors in the transcription that are not intended.    Electronically signed by Marcene Duos, APRN - NP on 05/22/2023 at 8:22 AM.

## 2023-05-20 NOTE — Telephone Encounter (Signed)
 Going to ER tomorrow  Doctor, hospital First)  View All Conversations on this Encounter  Juliane Poot, [place] routed conversation to [facility] Cardiology Triage12 minutes ago (3:57 PM)     Elnora Morrison Widmann  P Marlis Edelson [facility] Cardiology Clinical Staff (supporting [name], MD)12 minutes ago (3:57 PM)       Please inform Dr Freddi Starr [place]. I am having the same symptoms I had before being diagnosed with Afib. Heavy chest and shortness of breath with exertion.

## 2023-05-20 NOTE — Telephone Encounter (Addendum)
 Very bad heaviness in chest with very bad SOB with any exertion x 3 days.   Feels okay at rest.   Resting HR-89.   Abdomen feels very full with increased discomfort after eating.   States symptoms are exactly the same as he had when hospitalized with a fib and volume overload in January 2025, so he plans to go to [facility] ER, tomorrow morning, after 7:30 am LE arterial US.     Advised patient to go to [facility] ER, now, if he feels he needs to go to ER. Patient verbalized understanding, but states he really needs to get [place], tomorrow morning, and would prefer to wait until after [place] to go to ER. Strongly advised patient to go to [facility] ER, this evening, if symptoms increase or if he feels he is in distress. Advised patient that he will be evaluated by ER doctor, who will call in cardiologist and other specialists, as needed. Patient verbalized understanding.

## 2023-05-20 NOTE — Other (Signed)
 05/20/23 1413   Wound 05/20/23 Pretibial Right;Lower #1   Date First Assessed/Time First Assessed: 05/20/23 1412   Present on Original Admission: Yes  Wound Approximate Age at First Assessment (Weeks): 2 weeks  Primary Wound Type: (c) Other (comment)  Location: Pretibial  Wound Location Orientation: Right;Lo...   Wound Image    Wound Etiology Other   Dressing Status Clean;Dry;Intact   Wound Cleansed Cleansed with saline   Dressing/Treatment Gauze dressing/dressing sponge   Wound Length (cm) 2.6 cm   Wound Width (cm) 3 cm   Wound Depth (cm) 0.2 cm   Wound Surface Area (cm^2) 7.8 cm^2   Wound Volume (cm^3) 1.56 cm^3   Wound Assessment Pink/red;Slough   Drainage Amount Moderate (25-50%)   Drainage Description Serosanguinous   Odor None   Peri-wound Assessment Edematous;Blanchable erythema;Hemosiderin staining (brown yellow)   Wound Thickness Description not for Pressure Injury Full thickness

## 2023-05-20 NOTE — Assessment & Plan Note (Signed)
 Assessment  SP Mohs procedure; surgical site sutures ruptured immediately post op, likely due to swelling  History of swelling; does not wear compression  Wound bed is pink tissue with slough; no localized edema or periwound erythema to suggest infection  Plan  Excisional debridement remove devitalized tissue and promote wound healing  Wound care: Vashe cleanse, Hydrofera Blue, change three times weekly; Tubigrip for compression  RTC 1 week

## 2023-05-20 NOTE — Discharge Instructions (Signed)
 Discharge Instructions for  [facility]  [address]  [address]  [place], [place] [address]  Phone [phone]   Fax [phone]      NAME:  Chad Rosario  DATE OF BIRTH:  04-22-1955  MEDICAL RECORD NUMBER:  [id number]  DATE:  @ED @    Return Appointment:   1 week with Orlie Pollen, NP      Instructions: RLE:  Cleanse with Vashe ( may purchase at [facility]) let sit on wound for at least one minute  Hydrofera Ready: Cut to wound size, place in wound bed, shiny side out.    Cover with cover dressing  Change 3 times per week    Elevate legs while sitting  Eat minimum of 100 grams of protein per day  Protein:   Egg ~ 6g  Yogurt ~ 15g  Half cup of cottage cheese ~ 15g  Chicken breast ~ 30g  Salmon filet ~ 40g   Wear double tubigrip socks daily   ABI scheduled for tomorrow, may consider 2 layer wrap next week          Should you experience increased redness, swelling, pain, foul odor, size of wound(s), or have a temperature over 101 degrees please contact the Wound Healing Center at [phone] or if after hours contact your primary care physician or go to the hospital emergency department.    PLEASE NOTE: IF YOU ARE UNABLE TO OBTAIN WOUND SUPPLIES, CONTINUE TO USE THE SUPPLIES YOU HAVE AVAILABLE UNTIL YOU ARE ABLE TO REACH [place]. IT IS MOST IMPORTANT TO KEEP THE WOUND COVERED AT ALL TIMES.    Electronically signed Harmon Dun, RN on 05/20/2023 at 2:30 PM

## 2023-05-20 NOTE — Other (Signed)
 05/20/23 1413   Wound 05/20/23 Pretibial Right;Lower #1   Date First Assessed/Time First Assessed: 05/20/23 1412   Present on Original Admission: Yes  Wound Approximate Age at First Assessment (Weeks): 2 weeks  Primary Wound Type: (c) Other (comment)  Location: Pretibial  Wound Location Orientation: Right;Lo...   Wound Image     Wound Etiology Other   Dressing Status Clean;Dry;Intact   Wound Cleansed Cleansed with saline   Dressing/Treatment Gauze dressing/dressing sponge   Wound Length (cm) 2.6 cm   Wound Width (cm) 3 cm   Wound Depth (cm) 0.2 cm   Wound Surface Area (cm^2) 7.8 cm^2   Wound Volume (cm^3) 1.56 cm^3   Post-Procedure Length (cm) 2.6 cm   Post-Procedure Width (cm) 3 cm   Post-Procedure Depth (cm) 0.2 cm   Post-Procedure Surface Area (cm^2) 7.8 cm^2   Post-Procedure Volume (cm^3) 1.56 cm^3   Wound Assessment Pink/red;Slough   Drainage Amount Moderate (25-50%)   Drainage Description Serosanguinous   Odor None   Peri-wound Assessment Edematous;Blanchable erythema;Hemosiderin staining (brown yellow)   Wound Thickness Description not for Pressure Injury Full thickness     Post debridement

## 2023-05-20 NOTE — Telephone Encounter (Signed)
 Per medical record, reviewed by Dr. Freddi Starr. No new recommendations.

## 2023-05-20 NOTE — Telephone Encounter (Signed)
 Called patient. See triage note.

## 2023-05-20 NOTE — Wound Image (Signed)
 Discharge Instructions for  [facility]  [address]  [address]  [place], [place] [address]  Phone [phone]   Fax [phone]      NAME:  Chad Rosario  DATE OF BIRTH:  [birth year]  MEDICAL RECORD NUMBER:  [id number]  DATE:  05/20/2023    Return Appointment:   1 week with Orlie Pollen, NP      Instructions: RLE:  Cleanse with Vashe ( may purchase at [facility]) let sit on wound for at least one minute  Hydrofera Ready: Cut to wound size, place in wound bed, shiny side out.    Cover with cover dressing  Change 3 times per week    Elevate legs while sitting  Eat minimum of 100 grams of protein per day  Protein:   Egg ~ 6g  Yogurt ~ 15g  Half cup of cottage cheese ~ 15g  Chicken breast ~ 30g  Salmon filet ~ 40g   Wear double tubigrip socks daily   ABI scheduled for tomorrow, may consider 2 layer wrap next week        Should you experience increased redness, swelling, pain, foul odor, size of wound(s), or have a temperature over 101 degrees please contact the Wound Healing Center at [phone] or if after hours contact your primary care physician or go to the hospital emergency department.    PLEASE NOTE: IF YOU ARE UNABLE TO OBTAIN WOUND SUPPLIES, CONTINUE TO USE THE SUPPLIES YOU HAVE AVAILABLE UNTIL YOU ARE ABLE TO REACH [place]. IT IS MOST IMPORTANT TO KEEP THE WOUND COVERED AT ALL TIMES.    Electronically signed Harmon Dun, RN on 05/20/2023 at 2:32 PM

## 2023-05-21 ENCOUNTER — Inpatient Hospital Stay
Admission: EM | Admit: 2023-05-21 | Discharge: 2023-05-23 | Disposition: A | Discharge status: Home / Self Care | Payer: MEDICARE | Admitting: Cardiovascular Disease

## 2023-05-21 ENCOUNTER — Inpatient Hospital Stay: Admit: 2023-05-21 | Discharge: 2023-05-27 | Payer: MEDICARE | Primary: Family Medicine

## 2023-05-21 ENCOUNTER — Emergency Department: Admit: 2023-05-21 | Payer: MEDICARE | Primary: Family Medicine

## 2023-05-21 DIAGNOSIS — R29898 Other symptoms and signs involving the musculoskeletal system: Secondary | ICD-10-CM

## 2023-05-21 DIAGNOSIS — I509 Heart failure, unspecified: Secondary | ICD-10-CM

## 2023-05-21 LAB — CBC WITH AUTO DIFFERENTIAL
Basophils %: 0.6 % (ref 0.0–2.0)
Basophils Absolute: 0.05 10*3/uL (ref 0.00–0.20)
Eosinophils %: 6.6 % (ref 0.5–7.8)
Eosinophils Absolute: 0.51 10*3/uL (ref 0.00–0.80)
Hematocrit: 49.2 % (ref 41.1–50.3)
Hemoglobin: 17.5 g/dL — ABNORMAL HIGH (ref 13.6–17.2)
Immature Granulocytes %: 0.5 % (ref 0.0–5.0)
Immature Granulocytes Absolute: 0.04 10*3/uL (ref 0.0–0.5)
Lymphocytes %: 19.9 % (ref 13.0–44.0)
Lymphocytes Absolute: 1.54 10*3/uL (ref 0.50–4.60)
MCH: 30.5 pg (ref 26.1–32.9)
MCHC: 35.6 g/dL — ABNORMAL HIGH (ref 31.4–35.0)
MCV: 85.9 FL (ref 82–102)
MPV: 12.9 FL — ABNORMAL HIGH (ref 9.4–12.3)
Monocytes %: 7.4 % (ref 4.0–12.0)
Monocytes Absolute: 0.57 10*3/uL (ref 0.10–1.30)
Neutrophils %: 65 % (ref 43.0–78.0)
Neutrophils Absolute: 5.04 10*3/uL (ref 1.70–8.20)
Platelets: 85 10*3/uL — ABNORMAL LOW (ref 150–450)
RBC: 5.73 M/uL — ABNORMAL HIGH (ref 4.23–5.6)
RDW: 16.3 % — ABNORMAL HIGH (ref 11.9–14.6)
WBC: 7.8 10*3/uL (ref 4.3–11.1)
nRBC: 0 10*3/uL (ref 0.0–0.2)

## 2023-05-21 LAB — EKG 12-LEAD
Atrial Rate: 79 {beats}/min
Q-T Interval: 276 ms
QRS Duration: 70 ms
QTc Calculation (Bazett): 408 ms
R Axis: 4 degrees
T Axis: 67 degrees
Ventricular Rate: 132 {beats}/min

## 2023-05-21 LAB — VAS DUP LOWER EXT ARTERIES BILATERAL W ABI
Left arm BP: 136 mmHg
Left dorsalis pedis BP: 165 mmHg
Left posterior tibial: 152 mmHg
Left toe pressure: 93 mmHg
Right arm BP: 133 mmHg

## 2023-05-21 LAB — COMPREHENSIVE METABOLIC PANEL W/ REFLEX TO MG FOR LOW K
ALT: 48 U/L (ref 8–55)
AST: 53 U/L — ABNORMAL HIGH (ref 15–37)
Albumin/Globulin Ratio: 1 (ref 1.0–1.9)
Albumin: 4 g/dL (ref 3.2–4.6)
Alk Phosphatase: 63 U/L (ref 40–129)
Anion Gap: 13 mmol/L (ref 7–16)
BUN: 26 mg/dL — ABNORMAL HIGH (ref 8–23)
CO2: 24 mmol/L (ref 20–29)
Calcium: 9.8 mg/dL (ref 8.8–10.2)
Chloride: 101 mmol/L (ref 98–107)
Creatinine: 1.2 mg/dL (ref 0.80–1.30)
Est, Glom Filt Rate: 66 mL/min/{1.73_m2} (ref >60)
Globulin: 4.1 g/dL — ABNORMAL HIGH (ref 2.3–3.5)
Glucose: 114 mg/dL — ABNORMAL HIGH (ref 70–99)
Potassium: 4.8 mmol/L (ref 3.5–5.1)
Sodium: 138 mmol/L (ref 136–145)
Total Bilirubin: 0.9 mg/dL (ref 0.0–1.2)
Total Protein: 8.1 g/dL (ref 6.3–8.2)

## 2023-05-21 LAB — MAGNESIUM: Magnesium: 1.8 mg/dL (ref 1.8–2.4)

## 2023-05-21 LAB — TROPONIN
Troponin T: 55 ng/L — ABNORMAL HIGH (ref 0–22)
Troponin T: 52 ng/L — ABNORMAL HIGH (ref 0–22)

## 2023-05-21 LAB — BRAIN NATRIURETIC PEPTIDE: NT Pro-BNP: 1008 pg/mL — ABNORMAL HIGH (ref 0–125)

## 2023-05-21 MED ORDER — SODIUM CHLORIDE 0.9 % IV SOLN
0.9 | INTRAVENOUS | Status: DC | PRN
Start: 2023-05-21 — End: 2023-05-23

## 2023-05-21 MED ORDER — DOXYCYCLINE HYCLATE 100 MG PO CAPS
100 | Freq: Two times a day (BID) | ORAL | Status: DC
Start: 2023-05-21 — End: 2023-05-23
  Administered 2023-05-22 – 2023-05-23 (×4): 100 mg via ORAL

## 2023-05-21 MED ORDER — POLYETHYLENE GLYCOL 3350 17 G PO PACK
17 | Freq: Every day | ORAL | Status: DC | PRN
Start: 2023-05-21 — End: 2023-05-23

## 2023-05-21 MED ORDER — CETIRIZINE HCL 10 MG PO TABS
10 | Freq: Every day | ORAL | Status: DC
Start: 2023-05-21 — End: 2023-05-23
  Administered 2023-05-22 – 2023-05-23 (×2): 10 mg via ORAL

## 2023-05-21 MED ORDER — CHOLECALCIFEROL 10 MCG (400 UNIT) PO TABS
400 | Freq: Every day | ORAL | Status: DC
Start: 2023-05-21 — End: 2023-05-23
  Administered 2023-05-22: 13:00:00 800 [IU] via ORAL

## 2023-05-21 MED ORDER — ALLOPURINOL 300 MG PO TABS
300 | Freq: Every day | ORAL | Status: DC
Start: 2023-05-21 — End: 2023-05-23
  Administered 2023-05-22 – 2023-05-23 (×2): 300 mg via ORAL

## 2023-05-21 MED ORDER — ALPHA-LIPOIC ACID 200 MG PO CAPS
200 | Freq: Every day | ORAL | Status: DC
Start: 2023-05-21 — End: 2023-05-21

## 2023-05-21 MED ORDER — ACETAMINOPHEN 325 MG PO TABS
325 | Freq: Four times a day (QID) | ORAL | Status: DC | PRN
Start: 2023-05-21 — End: 2023-05-23
  Administered 2023-05-22 – 2023-05-23 (×2): 650 mg via ORAL

## 2023-05-21 MED ORDER — ONDANSETRON 4 MG PO TBDP
4 | Freq: Three times a day (TID) | ORAL | Status: DC | PRN
Start: 2023-05-21 — End: 2023-05-23

## 2023-05-21 MED ORDER — METOPROLOL SUCCINATE ER 50 MG PO TB24
50 | Freq: Two times a day (BID) | ORAL | Status: DC
Start: 2023-05-21 — End: 2023-05-23
  Administered 2023-05-22 – 2023-05-23 (×4): 50 mg via ORAL

## 2023-05-21 MED ORDER — RIVAROXABAN 20 MG PO TABS
20 | Freq: Every day | ORAL | Status: DC
Start: 2023-05-21 — End: 2023-05-21

## 2023-05-21 MED ORDER — VITAMIN B-6 50 MG PO TABS
50 | Freq: Every day | ORAL | Status: DC
Start: 2023-05-21 — End: 2023-05-23
  Administered 2023-05-22 – 2023-05-23 (×2): 25 mg via ORAL

## 2023-05-21 MED ORDER — SODIUM CHLORIDE 0.9 % IV SOLN (ADD-VANTAGE)
0.9 | INTRAVENOUS | Status: DC
Start: 2023-05-21 — End: 2023-05-22
  Administered 2023-05-21: 22:00:00 5 mg/h via INTRAVENOUS
  Administered 2023-05-22: 10:00:00 2.5 mg/h via INTRAVENOUS

## 2023-05-21 MED ORDER — ONDANSETRON HCL 4 MG/2ML IJ SOLN
42 | Freq: Four times a day (QID) | INTRAMUSCULAR | Status: DC | PRN
Start: 2023-05-21 — End: 2023-05-23

## 2023-05-21 MED ORDER — FUROSEMIDE 40 MG PO TABS
40 | Freq: Every day | ORAL | Status: DC | PRN
Start: 2023-05-21 — End: 2023-05-23

## 2023-05-21 MED ORDER — NORMAL SALINE FLUSH 0.9 % IV SOLN
0.9 | Freq: Two times a day (BID) | INTRAVENOUS | Status: DC
Start: 2023-05-21 — End: 2023-05-23
  Administered 2023-05-22 – 2023-05-23 (×3): 10 mL via INTRAVENOUS

## 2023-05-21 MED ORDER — RIVAROXABAN 20 MG PO TABS
20 | Freq: Every day | ORAL | Status: DC
Start: 2023-05-21 — End: 2023-05-23
  Administered 2023-05-21 – 2023-05-23 (×2): 20 mg via ORAL

## 2023-05-21 MED ORDER — GABAPENTIN 300 MG PO CAPS
300 | Freq: Three times a day (TID) | ORAL | Status: DC
Start: 2023-05-21 — End: 2023-05-23
  Administered 2023-05-21 – 2023-05-23 (×6): 600 mg via ORAL

## 2023-05-21 MED ORDER — DILTIAZEM HCL 25 MG/5ML IV SOLN
255 | INTRAVENOUS | Status: DC
Start: 2023-05-21 — End: 2023-05-21

## 2023-05-21 MED ORDER — ZOLPIDEM TARTRATE 5 MG PO TABS
5 | Freq: Every evening | ORAL | Status: DC | PRN
Start: 2023-05-21 — End: 2023-05-23
  Administered 2023-05-22 – 2023-05-23 (×2): 5 mg via ORAL

## 2023-05-21 MED ORDER — LISINOPRIL 20 MG PO TABS
20 | Freq: Every day | ORAL | Status: DC
Start: 2023-05-21 — End: 2023-05-23
  Administered 2023-05-22 – 2023-05-23 (×2): 40 mg via ORAL

## 2023-05-21 MED ORDER — PYRIDOXINE HCL 25 MG PO TABS
25 | Freq: Every morning | ORAL | Status: DC
Start: 2023-05-21 — End: 2023-05-21

## 2023-05-21 MED ORDER — ACETAMINOPHEN 650 MG RE SUPP
650 | Freq: Four times a day (QID) | RECTAL | Status: DC | PRN
Start: 2023-05-21 — End: 2023-05-23

## 2023-05-21 MED ORDER — NORMAL SALINE FLUSH 0.9 % IV SOLN
0.9 | INTRAVENOUS | Status: DC | PRN
Start: 2023-05-21 — End: 2023-05-23

## 2023-05-21 MED FILL — DILTIAZEM HCL 100 MG IV SOLR: 100 MG | INTRAVENOUS | Qty: 1

## 2023-05-21 MED FILL — GABAPENTIN 300 MG PO CAPS: 300 MG | ORAL | Qty: 2

## 2023-05-21 MED FILL — XARELTO 20 MG PO TABS: 20 MG | ORAL | Qty: 1

## 2023-05-21 NOTE — H&P (Signed)
 [facility]  [address], [address]  [place], [place] [address]  PHONE: [phone]         CONSULT        05/21/23      NAME:  Chad Rosario  DOB: [birth year]  MRN: [id number]      SUBJECTIVE:   Chad Rosario is a 68 y.o. male seen for a consultation visit regarding the following:     Chief Complaint   Patient presents with    Chest Pain    Shortness of Breath            HPI:    68 year old gentleman with previousEpisode of atrial fibrillation with rapid ventricular response. He has had exertional sob and palpitations and chest tightness. This is how he felt with his previous af episode    Hospital Problems             Last Modified POA    * (Principal) Atrial fibrillation with rapid ventricular response (HCC) 05/21/2023 Yes     Allergies   Allergen Reactions    Hydrocodone-Acetaminophen Anxiety and Itching    Losartan Shortness Of Breath    Amlodipine Other (See Comments)     Other reaction(s): Other (See Comments)  Fatigue   Fatigue       Rosuvastatin Other (See Comments)     Other reaction(s): Constipation  Fatigue, constipation  Other reaction(s): Fatigue    Atorvastatin Diarrhea    Ezetimibe      Other reaction(s): Constipation, Other (See Comments)  Constipation and decreased urine output  Constipation and decreased urine output  Other reaction(s): Constipation, Urine output low       Past Medical History:   Diagnosis Date    CAD (coronary artery disease) 2015    Gout     Hypertension     controlled with meds    Neuropathy May 2019    Osteoarthritis 03/2013    Sleep apnea     cpap at night     Past Surgical History:   Procedure Laterality Date    BACK SURGERY  2021    has had 3 different ones last was in 2021    CARDIAC SURGERY      CABG    CHOLECYSTECTOMY      COLONOSCOPY N/A 11/29/2020    COLONOSCOPY POLYPECTOMY HOT SNARE performed by Drucie Opitz, MD at [facility]    EYE SURGERY Left     cataract    FRACTURE SURGERY Left     tib-fib fracture    JOINT REPLACEMENT  Hip - 2018     KNEE ARTHROSCOPY Right     TOTAL HIP ARTHROPLASTY Left      Family History   Problem Relation Age of Onset    Cancer Mother         Lymp    Stroke Father     Cancer Sister         Breast    Clotting Disorder Maternal Grandfather      Social History     Tobacco Use    Smoking status: Never    Smokeless tobacco: Never   Substance Use Topics    Alcohol use: Yes     Alcohol/week: 12.0 standard drinks of alcohol     Types: 6 Cans of beer, 6 Shots of liquor per week     Comment: socially           ROS:    Constitution: Negative for fever.   Eyes:  Negative for blurred vision.   Respiratory: Negative for cough.    Endocrine: Negative for cold intolerance and heat intolerance.   Skin: Negative for rash.   Musculoskeletal: Negative for myalgias.   Gastrointestinal: Negative for diarrhea, nausea and vomiting.   Genitourinary: Negative for dysuria.   Neurological: Negative for headaches and numbness.          PHYSICAL EXAM:     BP (!) 139/92   Pulse 89   Temp 97.9 F (36.6 C) (Oral)   Resp 25   Ht 1.854 m (6\' 1" )   Wt 128.3 kg (282 lb 12.8 oz)   SpO2 98%   BMI 37.31 kg/m    Constitutional: Oriented to person, place, and time. Appears well-developed and well-nourished.   Head: Normocephalic and atraumatic.   Neck: Neck supple.   Cardiovascular: Normal rate and regular rhythm with no murmur -No JVP  Pulmonary/Chest: Breath sounds normal.   Abdominal: Soft.   Musculoskeletal: No edema.   Neurological: Alert and oriented to person, place, and time.   Skin: Skin is warm and dry.   Psychiatric: Normal mood and affect.   Vitals reviewed        Medical problems and test results were reviewed with the patient today.     Wt Readings from Last 3 Encounters:   05/21/23 128.3 kg (282 lb 12.8 oz)   05/20/23 131.1 kg (289 lb)   05/13/23 133.4 kg (294 lb 3.2 oz)          Recent Results (from the past 672 hour(s))   Magnesium    Collection Time: 04/29/23 11:55 AM   Result Value Ref Range    Magnesium 1.8 1.8 - 2.4 mg/dL   Basic  Metabolic Panel    Collection Time: 04/29/23 11:55 AM   Result Value Ref Range    Sodium 136 136 - 145 mmol/L    Potassium 5.0 3.5 - 5.1 mmol/L    Chloride 102 98 - 107 mmol/L    CO2 23 20 - 29 mmol/L    Anion Gap 11 7 - 16 mmol/L    Glucose 107 (H) 70 - 99 mg/dL    BUN 20 8 - 23 MG/DL    Creatinine 1.61 0.96 - 1.30 MG/DL    Est, Glom Filt Rate 72 >60 ml/min/1.49m2    Calcium 9.3 8.8 - 10.2 MG/DL   CBC with Auto Differential    Collection Time: 04/29/23 11:55 AM   Result Value Ref Range    WBC 7.5 4.3 - 11.1 K/uL    RBC 5.37 4.23 - 5.6 M/uL    Hemoglobin 16.7 13.6 - 17.2 g/dL    Hematocrit 04.5 40.9 - 50.3 %    MCV 90.5 82 - 102 FL    MCH 31.1 26.1 - 32.9 PG    MCHC 34.4 31.4 - 35.0 g/dL    RDW 81.1 (H) 91.4 - 14.6 %    Platelets 75 (A) 150 - 450 K/uL    MPV 13.0 (H) 9.4 - 12.3 FL    nRBC 0.00 0.0 - 0.2 K/uL    Differential Type AUTOMATED      Neutrophils % 55.8 43.0 - 78.0 %    Lymphocytes % 24.0 13.0 - 44.0 %    Monocytes % 9.7 4.0 - 12.0 %    Eosinophils % 9.3 (H) 0.5 - 7.8 %    Basophils % 0.9 0.0 - 2.0 %    Immature Granulocytes % 0.3 0.0 - 5.0 %  Neutrophils Absolute 4.20 1.70 - 8.20 K/UL    Lymphocytes Absolute 1.81 0.50 - 4.60 K/UL    Monocytes Absolute 0.73 0.10 - 1.30 K/UL    Eosinophils Absolute 0.70 0.00 - 0.80 K/UL    Basophils Absolute 0.07 0.00 - 0.20 K/UL    Immature Granulocytes Absolute 0.02 0.0 - 0.5 K/UL   EKG 12 Lead    Collection Time: 05/09/23  7:24 AM   Result Value Ref Range    Ventricular Rate 103 BPM    QRS Duration 86 ms    Q-T Interval 356 ms    QTc Calculation (Bazett) 466 ms    R Axis -35 degrees    T Axis 80 degrees    Diagnosis       Atrial fibrillation with rapid ventricular response  Left axis deviation  Septal infarct , age undetermined  Abnormal ECG  No previous ECGs available  Confirmed by BITTRICK  MD (UC), Karie Kirks [phone]) on 05/09/2023 7:40:08 AM     Platelet Count    Collection Time: 05/09/23  7:31 AM   Result Value Ref Range    Platelets 84 (L) 150 - 450 K/uL   TEE with  possible cardioversion (PRN contrast/bubbles/3D)    Collection Time: 05/09/23  8:21 AM   Result Value Ref Range    Body Surface Area 2.56 m2    Aortic Root 3.3 cm    Ao Root Index 1.33 cm/m2    Ascending Aorta 4.0 cm    Ascending Aorta Index 1.61 cm/m2   EKG 12 Lead    Collection Time: 05/09/23  8:28 AM   Result Value Ref Range    Ventricular Rate 72 BPM    Atrial Rate 72 BPM    P-R Interval 186 ms    QRS Duration 88 ms    Q-T Interval 396 ms    QTc Calculation (Bazett) 433 ms    P Axis 51 degrees    R Axis -49 degrees    T Axis 44 degrees    Diagnosis       Normal sinus rhythm with sinus arrhythmia  Left anterior fascicular block  When compared with ECG of 09-May-2023 07:24,  Sinus rhythm has replaced Atrial fibrillation    Confirmed by Tawni Carnes MD, Greig Castilla [phone]) on 05/09/2023 8:59:31 AM     Vascular duplex lower extremity arteries bilateral with ABI    Collection Time: 05/21/23  8:24 AM   Result Value Ref Range    Left arm BP 136 mmHg    Left posterior tibial 152 mmHg    Left dorsalis pedis BP 165 mmHg    Left toe pressure 93 mmHg    Right arm BP 133 mmHg   EKG 12 Lead    Collection Time: 05/21/23 10:26 AM   Result Value Ref Range    Ventricular Rate 132 BPM    Atrial Rate 79 BPM    QRS Duration 70 ms    Q-T Interval 276 ms    QTc Calculation (Bazett) 408 ms    R Axis 4 degrees    T Axis 67 degrees    Diagnosis       Atrial fibrillation with rapid ventricular response  Septal infarct (cited on or before 21-May-2023)  When compared with ECG of 09-May-2023 08:28,  Current undetermined rhythm precludes rhythm comparison, needs review  QRS axis Shifted right    Confirmed by Tawni Carnes MD, Greig Castilla [phone]) on 05/21/2023 11:05:48 AM     CBC with Auto Differential  Collection Time: 05/21/23 10:28 AM   Result Value Ref Range    WBC 7.8 4.3 - 11.1 K/uL    RBC 5.73 (H) 4.23 - 5.6 M/uL    Hemoglobin 17.5 (H) 13.6 - 17.2 g/dL    Hematocrit 16.1 09.6 - 50.3 %    MCV 85.9 82 - 102 FL    MCH 30.5 26.1 - 32.9 PG    MCHC 35.6 (H)  31.4 - 35.0 g/dL    RDW 04.5 (H) 40.9 - 14.6 %    Platelets 85 (L) 150 - 450 K/uL    MPV 12.9 (H) 9.4 - 12.3 FL    nRBC 0.00 0.0 - 0.2 K/uL    Differential Type AUTOMATED      Neutrophils % 65.0 43.0 - 78.0 %    Lymphocytes % 19.9 13.0 - 44.0 %    Monocytes % 7.4 4.0 - 12.0 %    Eosinophils % 6.6 0.5 - 7.8 %    Basophils % 0.6 0.0 - 2.0 %    Immature Granulocytes % 0.5 0.0 - 5.0 %    Neutrophils Absolute 5.04 1.70 - 8.20 K/UL    Lymphocytes Absolute 1.54 0.50 - 4.60 K/UL    Monocytes Absolute 0.57 0.10 - 1.30 K/UL    Eosinophils Absolute 0.51 0.00 - 0.80 K/UL    Basophils Absolute 0.05 0.00 - 0.20 K/UL    Immature Granulocytes Absolute 0.04 0.0 - 0.5 K/UL   Comprehensive Metabolic Panel w/ Reflex to MG    Collection Time: 05/21/23 10:28 AM   Result Value Ref Range    Sodium 138 136 - 145 mmol/L    Potassium 4.8 3.5 - 5.1 mmol/L    Chloride 101 98 - 107 mmol/L    CO2 24 20 - 29 mmol/L    Anion Gap 13 7 - 16 mmol/L    Glucose 114 (H) 70 - 99 mg/dL    BUN 26 (H) 8 - 23 MG/DL    Creatinine 8.11 9.14 - 1.30 MG/DL    Est, Glom Filt Rate 66 >60 ml/min/1.83m2    Calcium 9.8 8.8 - 10.2 MG/DL    Total Bilirubin 0.9 0.0 - 1.2 MG/DL    ALT 48 8 - 55 U/L    AST 53 (H) 15 - 37 U/L    Alk Phosphatase 63 40 - 129 U/L    Total Protein 8.1 6.3 - 8.2 g/dL    Albumin 4.0 3.2 - 4.6 g/dL    Globulin 4.1 (H) 2.3 - 3.5 g/dL    Albumin/Globulin Ratio 1.0 1.0 - 1.9     Troponin    Collection Time: 05/21/23 10:28 AM   Result Value Ref Range    Troponin T 55.0 (H) 0 - 22 ng/L   Brain Natriuretic Peptide    Collection Time: 05/21/23 10:28 AM   Result Value Ref Range    NT Pro-BNP 1,008 (H) 0 - 125 PG/ML   Troponin now then q90 min for 2 occurances    Collection Time: 05/21/23 12:37 PM   Result Value Ref Range    Troponin T 52.0 (H) 0 - 22 ng/L   Magnesium    Collection Time: 05/21/23 12:37 PM   Result Value Ref Range    Magnesium 1.8 1.8 - 2.4 mg/dL     Lab Results   Component Value Date/Time    CHOL 144 03/11/2023 08:58 AM    HDL 46 03/11/2023  08:58 AM    LDL 82 03/11/2023 08:58 AM  LDL 105.2 02/07/2022 07:55 AM    VLDL 16 03/11/2023 08:58 AM     EKG- AF WITH RVR          ASSESSMENT and PLAN    68 year old gentleman with recurrent atrial fibrillation with rapid ventricular response.  His symptoms are the same as he was before his last cardioversion.  Will admit to monitored bed placed on IV Cardizem restart Xarelto and plan for cardioversion and TEE tomorrow as well as electrophysiology evaluation          Thank you for allowing me to participate in this patient's care.  Please call or contact me if there are any questions or concerns regarding the above.      Hendricks Limes, MD  05/21/23  4:11 PM

## 2023-05-21 NOTE — ED Triage Notes (Addendum)
 Pt arrives in [facility] w/ cc of chest tightness and shortness of breath w/ exertion. Pt states pain is worse w/ deep inspiration and following food consumption. Pt hx A-fib, coronary bypass surgery (2017). Pt A&O X 4

## 2023-05-21 NOTE — Progress Notes (Signed)
 4 Eyes Skin Assessment     NAME:  Chad Rosario  DATE OF BIRTH:  [birth year]  MEDICAL RECORD NUMBER:  [id number]    The patient is being assessed for  Admission    I agree that at least one RN has performed a thorough Head to Toe Skin Assessment on the patient. ALL assessment sites listed below have been assessed.      Areas assessed by both nurses:    Head, Face, Ears, Shoulders, Back, Chest, Arms, Elbows, Hands, Sacrum. Buttock, Coccyx, Ischium, and Legs. Feet and Heels        Does the Patient have a Wound? Yes wound(s) were present on assessment. LDA wound assessment was Initiated and completed by RN    Patient has wound located on his right lower leg. Wound consult ordered. Patient sees wound care outpatient prior to admission. Patient has scattered scars and bruising. Swelling to BLE.        Braden Prevention initiated by RN: No  Wound Care Orders initiated by RN: Yes    Pressure Injury (Stage 3,4, Unstageable, DTI, NWPT, and Complex wounds) if present, place Wound referral order by RN under ORDER ENTRY: No    New Ostomies, if present place, Ostomy referral order under ORDER ENTRY: No     Nurse 1 eSignature: Electronically signed by Lucillie Garfinkel, RN on 05/21/23 at 6:03 PM EST    **SHARE this note so that the co-signing nurse can place an eSignature**    Nurse 2 eSignature: Electronically signed by Rubye Beach, RN on 05/21/23 at 6:04 PM EST

## 2023-05-21 NOTE — Plan of Care (Signed)
 Problem: Chronic Conditions and Co-morbidities  Goal: Patient's chronic conditions and co-morbidity symptoms are monitored and maintained or improved  Outcome: Progressing  Flowsheets (Taken 05/21/2023 1620)  Care Plan - Patient's Chronic Conditions and Co-Morbidity Symptoms are Monitored and Maintained or Improved:   Monitor and assess patient's chronic conditions and comorbid symptoms for stability, deterioration, or improvement   Collaborate with multidisciplinary team to address chronic and comorbid conditions and prevent exacerbation or deterioration   Update acute care plan with appropriate goals if chronic or comorbid symptoms are exacerbated and prevent overall improvement and discharge     Problem: Discharge Planning  Goal: Discharge to home or other facility with appropriate resources  Outcome: Progressing  Flowsheets (Taken 05/21/2023 1620)  Discharge to home or other facility with appropriate resources:   Identify barriers to discharge with patient and caregiver   Arrange for needed discharge resources and transportation as appropriate   Identify discharge learning needs (meds, wound care, etc)   Refer to discharge planning if patient needs post-hospital services based on physician order or complex needs related to functional status, cognitive ability or social support system     Problem: Pain  Goal: Verbalizes/displays adequate comfort level or baseline comfort level  Outcome: Progressing  Flowsheets (Taken 05/21/2023 1620)  Verbalizes/displays adequate comfort level or baseline comfort level:   Encourage patient to monitor pain and request assistance   Assess pain using appropriate pain scale     Problem: Safety - Adult  Goal: Free from fall injury  Outcome: Progressing     Problem: ABCDS Injury Assessment  Goal: Absence of physical injury  Outcome: Progressing

## 2023-05-21 NOTE — Progress Notes (Signed)
 TRANSFER - IN REPORT:    Verbal report received from [facility] on Chad Rosario  being received from ED for routine progression of patient care      Report consisted of patient's Situation, Background, Assessment and   Recommendations(SBAR).     Information from the following report(s) Nurse Handoff Report, ED Encounter Summary, ED SBAR, Adult Overview, MAR, Cardiac Rhythm Afib, and Neuro Assessment was reviewed with the receiving nurse.    Opportunity for questions and clarification was provided.      Assessment completed upon patient's arrival to unit and care assumed.

## 2023-05-21 NOTE — ED Notes (Signed)
 TRANSFER - OUT REPORT:    Verbal report given to [place], RN on Chad Rosario  being transferred to [facility] Telemetry 401 for routine progression of patient care       Report consisted of patient's Situation, Background, Assessment and   Recommendations(SBAR).     Information from the following report(s) ED Encounter Summary, ED SBAR, Intake/Output, MAR, Recent Results, and Cardiac Rhythm Afib  was reviewed with the receiving nurse.    Lines:   Peripheral IV 05/21/23 Left Antecubital (Active)   Site Assessment Clean, dry & intact 05/21/23 1029        Opportunity for questions and clarification was provided.      Patient transported with:  Ferman Hamming, Freda Jackson, RN  05/21/23 [id number]

## 2023-05-21 NOTE — ED Provider Notes (Signed)
 Emergency Department Provider Note       PCP: Chad Ranks, MD   Age: 68 y.o.   Sex: male     DISPOSITION Decision To Admit 05/21/2023 01:21:09 PM   DISPOSITION CONDITION Stable            ICD-10-CM    1. Congestive heart failure, unspecified HF chronicity, unspecified heart failure type (HCC)  I50.9       2. Atrial fibrillation with RVR (HCC)  I48.91       3. Chest pain, unspecified type  R07.9           Medical Decision Making     Patient presents with A-fib with RVR and concerning chest pain with exertion.  This seems to have accelerated quickly over the weekend.  Troponins are flat in the 50s.  Does not appear to currently be NSTEMI.  Troponin mild leak could be from A-fib with RVR.  However, with his description of his chest pressure with exertion getting better with rest over the weekend hardly able to even walk in from the parking lot without having chest pressure here I believe he should be admitted and get angiogram.  Was going to give the patient diltiazem but his heart rate slowed into the low 100s to 90s without treatment.  I have consulted with cardiology Dr. Katrinka Blazing who will be coming to see the patient.     1 or more acute illnesses that pose a threat to life or bodily function.   Drug therapy given requiring intensive monitoring for toxicity.  Discussion with external consultants.  Chronic medical problems impacting care include CAD.  Shared medical decision making was utilized in creating the patients health plan today.    I independently ordered and reviewed each unique test.  I reviewed external records: provider visit note from outside specialist.  I reviewed external records: previous lab results from outside ED.  I reviewed external records: previous imaging study including radiologist interpretation.     I interpreted the X-rays chest x-ray shows no evidence of infiltrate or edema and heart size is normal.  I have reviewed and agree with radiology report.  My Independent EKG  Interpretation: sinus rhythm, no evidence of arrhythmia      ST Segments:Normal ST segments - NO STEMI   Rate: 132, atrial fibrillation  The patient was admitted and I have discussed patient management with the admitting provider.  The management of this patient was discussed with an external consultant.            History     Patient presents with chest tightness and pressure.  He has had this over the weekend.  Seems to be associated with exertion.  Says he cannot go more than about 50 yards and then he has to sit down and then the chest pressure and shortness of breath will dissipate.  Also feels as though his heart is irregular.  He denies any syncope.  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  Recently had TEE and cardioversion for atrial fibrillation        ROS     Review of Systems   All other systems reviewed and are negative.       Physical Exam     Vitals signs and nursing note reviewed:  Vitals:    05/21/23 1021 05/21/23 1030   BP: (!) 154/97    Pulse: 72 (!) 137   Resp: 18    Temp: 97.9 F (36.6  C)    TempSrc: Oral    SpO2: 98%    Weight: 131.1 kg (289 lb)    Height: 1.854 m (6\' 1" )       Physical Exam  Constitutional:       General: He is not in acute distress.  HENT:      Head: Normocephalic and atraumatic.      Right Ear: External ear normal.      Left Ear: External ear normal.      Nose: No congestion or rhinorrhea.      Mouth/Throat:      Mouth: Mucous membranes are moist.   Eyes:      Extraocular Movements: Extraocular movements intact.      Pupils: Pupils are equal, round, and reactive to light.   Cardiovascular:      Rate and Rhythm: Tachycardia present. Rhythm irregular.      Heart sounds: Normal heart sounds.   Pulmonary:      Effort: Pulmonary effort is normal.      Breath sounds: Normal breath sounds.   Abdominal:      General: There is no distension.      Palpations: Abdomen is soft.      Tenderness: There is no abdominal tenderness.   Musculoskeletal:          General: No swelling or tenderness. Normal range of motion.      Cervical back: Normal range of motion.   Skin:     Findings: No rash.   Neurological:      General: No focal deficit present.      Mental Status: He is alert and oriented to person, place, and time.   Psychiatric:         Mood and Affect: Mood normal.        Procedures     Procedures    Orders Placed This Encounter   Procedures    XR CHEST PORTABLE    CBC with Auto Differential    Comprehensive Metabolic Panel w/ Reflex to MG    Troponin now then q90 min for 2 occurances    Troponin    Brain Natriuretic Peptide    EKG 12 Lead        Medications given during this emergency department visit:  Medications   dilTIAZem injection 20 mg (has no administration in time range)       New Prescriptions    No medications on file        Past Medical History:   Diagnosis Date    CAD (coronary artery disease) 2015    Gout     Hypertension     controlled with meds    Neuropathy May 2019    Osteoarthritis 03/2013    Sleep apnea     cpap at night        Past Surgical History:   Procedure Laterality Date    BACK SURGERY  2021    has had 3 different ones last was in 2021    CARDIAC SURGERY      CABG    CHOLECYSTECTOMY      COLONOSCOPY N/A 11/29/2020    COLONOSCOPY POLYPECTOMY HOT SNARE performed by Drucie Opitz, MD at [facility]    EYE SURGERY Left     cataract    FRACTURE SURGERY Left     tib-fib fracture    JOINT REPLACEMENT  Hip - 2018    KNEE ARTHROSCOPY Right     TOTAL HIP ARTHROPLASTY Left  Social History     Socioeconomic History    Marital status: Married   Tobacco Use    Smoking status: Never    Smokeless tobacco: Never   Vaping Use    Vaping status: Never Used   Substance and Sexual Activity    Alcohol use: Yes     Alcohol/week: 12.0 standard drinks of alcohol     Types: 6 Cans of beer, 6 Shots of liquor per week     Comment: socially    Drug use: Never    Sexual activity: Not Currently     Partners: Female     Social Determinants of Health     Financial  Resource Strain: Low Risk  (04/09/2022)    Received from [facility], [facility]    Overall Financial Resource Strain (CARDIA)     Difficulty of Paying Living Expenses: Not hard at all   Food Insecurity: No Food Insecurity (04/17/2023)    Hunger Vital Sign     Worried About Running Out of Food in the Last Year: Never true     Ran Out of Food in the Last Year: Never true   Transportation Needs: No Transportation Needs (04/17/2023)    PRAPARE - Therapist, art (Medical): No     Lack of Transportation (Non-Medical): No   Physical Activity: Insufficiently Active (07/19/2022)    Exercise Vital Sign     Days of Exercise per Week: 2 days     Minutes of Exercise per Session: 30 min   Stress: No Stress Concern Present (04/09/2022)    Received from [facility], [facility] - Occupational Stress Questionnaire     Feeling of Stress : Not at all   Social Connections: Socially Integrated (04/09/2022)    Received from [facility], [facility]    Social Network     How would you rate your social network (family, work, friends)?: Good participation with social networks   Intimate Partner Violence: Not At Risk (04/09/2022)    Received from [facility], [facility]    HITS     Over the last 12 months how often did your partner physically hurt you?: Never     Over the last 12 months how often did your partner insult you or talk down to you?: Never     Over the last 12 months how often did your partner threaten you with physical harm?: Never     Over the last 12 months how often did your partner scream or curse at you?: Never   Housing Stability: Low Risk  (04/17/2023)    Housing Stability Vital Sign     Unable to Pay for Housing in the Last Year: No     Number of Times Moved in the Last Year: 0     Homeless in the Last Year: No        Previous Medications    ALLOPURINOL (ZYLOPRIM) 300 MG TABLET    TAKE 1 TABLET BY MOUTH DAILY    ALPHA-LIPOIC ACID 200 MG CAPS     Take 300 mg by mouth    COLCHICINE (MITIGARE) 0.6 MG CAPSULE    Take 1 capsule by mouth as needed for Pain    CPAP MACHINE MISC    by Does not apply route    CYANOCOBALAMIN (VITAMIN B 12 PO)    Take by mouth every morning    DOXYCYCLINE HYCLATE (VIBRAMYCIN) 100 MG CAPSULE  Take 1 capsule by mouth 2 times daily    FEXOFENADINE (ALLEGRA) 180 MG TABLET    Take 1 tablet by mouth daily    FUROSEMIDE (LASIX) 40 MG TABLET    Take 1 tablet by mouth daily as needed (take one tablet daily for weight garin greater than 2 lbs in 24 hours or 4 lbs in 48 hours.)    GABAPENTIN (NEURONTIN) 600 MG TABLET    Take 1 tablet by mouth in the morning, at noon, in the evening, and at bedtime.    LISINOPRIL (PRINIVIL;ZESTRIL) 20 MG TABLET    Take 2 tablets by mouth daily    METOPROLOL SUCCINATE (TOPROL XL) 50 MG EXTENDED RELEASE TABLET    Take 1 tablet by mouth 2 times daily    MULTIPLE VITAMINS-MINERALS (THERAPEUTIC MULTIVITAMIN-MINERALS) TABLET    Take 1 tablet by mouth daily    PYRIDOXINE HCL PO    Take by mouth every morning    RIVAROXABAN (XARELTO) 20 MG TABS TABLET    Take 1 tablet by mouth daily (with breakfast)    VITAMIN D3 (CHOLECALCIFEROL) 10 MCG (400 UNIT) TABS TABLET    Take 2 tablets by mouth daily    ZOLPIDEM (AMBIEN) 5 MG TABLET    Take 1 tablet by mouth nightly as needed for Sleep for up to 90 days. Max Daily Amount: 5 mg        Results for orders placed or performed during the hospital encounter of 05/21/23   XR CHEST PORTABLE    Narrative    Chest X-ray    INDICATION: Shortness of Breath    COMPARISON: 04/17/2023    TECHNIQUE: AP/PA view of the chest was obtained.    FINDINGS: The lungs are clear. There are no infiltrates or effusions.  The heart  size is normal.  The bony thorax is intact.  Metallic surgical clips and wires from previous sternotomy.        Impression    No acute findings in the chest      Electronically signed by Margarite Gouge   CBC with Auto Differential   Result Value Ref Range    WBC 7.8 4.3 -  11.1 K/uL    RBC 5.73 (H) 4.23 - 5.6 M/uL    Hemoglobin 17.5 (H) 13.6 - 17.2 g/dL    Hematocrit 16.1 09.6 - 50.3 %    MCV 85.9 82 - 102 FL    MCH 30.5 26.1 - 32.9 PG    MCHC 35.6 (H) 31.4 - 35.0 g/dL    RDW 04.5 (H) 40.9 - 14.6 %    Platelets 85 (L) 150 - 450 K/uL    MPV 12.9 (H) 9.4 - 12.3 FL    nRBC 0.00 0.0 - 0.2 K/uL    Differential Type AUTOMATED      Neutrophils % 65.0 43.0 - 78.0 %    Lymphocytes % 19.9 13.0 - 44.0 %    Monocytes % 7.4 4.0 - 12.0 %    Eosinophils % 6.6 0.5 - 7.8 %    Basophils % 0.6 0.0 - 2.0 %    Immature Granulocytes % 0.5 0.0 - 5.0 %    Neutrophils Absolute 5.04 1.70 - 8.20 K/UL    Lymphocytes Absolute 1.54 0.50 - 4.60 K/UL    Monocytes Absolute 0.57 0.10 - 1.30 K/UL    Eosinophils Absolute 0.51 0.00 - 0.80 K/UL    Basophils Absolute 0.05 0.00 - 0.20 K/UL    Immature Granulocytes Absolute 0.04 0.0 -  0.5 K/UL   Comprehensive Metabolic Panel w/ Reflex to MG   Result Value Ref Range    Sodium 138 136 - 145 mmol/L    Potassium 4.8 3.5 - 5.1 mmol/L    Chloride 101 98 - 107 mmol/L    CO2 24 20 - 29 mmol/L    Anion Gap 13 7 - 16 mmol/L    Glucose 114 (H) 70 - 99 mg/dL    BUN 26 (H) 8 - 23 MG/DL    Creatinine 6.43 3.29 - 1.30 MG/DL    Est, Glom Filt Rate 66 >60 ml/min/1.60m2    Calcium 9.8 8.8 - 10.2 MG/DL    Total Bilirubin 0.9 0.0 - 1.2 MG/DL    ALT 48 8 - 55 U/L    AST 53 (H) 15 - 37 U/L    Alk Phosphatase 63 40 - 129 U/L    Total Protein 8.1 6.3 - 8.2 g/dL    Albumin 4.0 3.2 - 4.6 g/dL    Globulin 4.1 (H) 2.3 - 3.5 g/dL    Albumin/Globulin Ratio 1.0 1.0 - 1.9     Troponin now then q90 min for 2 occurances   Result Value Ref Range    Troponin T 52.0 (H) 0 - 22 ng/L   Troponin   Result Value Ref Range    Troponin T 55.0 (H) 0 - 22 ng/L   Brain Natriuretic Peptide   Result Value Ref Range    NT Pro-BNP 1,008 (H) 0 - 125 PG/ML   EKG 12 Lead   Result Value Ref Range    Ventricular Rate 132 BPM    Atrial Rate 79 BPM    QRS Duration 70 ms    Q-T Interval 276 ms    QTc Calculation (Bazett) 408 ms    R  Axis 4 degrees    T Axis 67 degrees    Diagnosis       Atrial fibrillation with rapid ventricular response  Septal infarct (cited on or before 21-May-2023)  When compared with ECG of 09-May-2023 08:28,  Current undetermined rhythm precludes rhythm comparison, needs review  QRS axis Shifted right    Confirmed by Tawni Carnes MD, Greig Castilla [id number]) on 05/21/2023 11:05:48 AM     Results for orders placed or performed during the hospital encounter of 05/21/23   Vascular duplex lower extremity arteries bilateral with ABI   Result Value Ref Range    Left arm BP 136 mmHg    Left posterior tibial 152 mmHg    Left dorsalis pedis BP 165 mmHg    Left toe pressure 93 mmHg    Right arm BP 133 mmHg    Narrative    TITLE: Bilateral lower extremity arterial ultrasound examination.    INDICATION: 68 year old non-smoker with history of PAD and angioplasty.  Bilateral lower extremity claudication and hypertension..    TECHNIQUE: Grayscale, color, and Doppler interrogation performed.  The entire  length of all of the arteries of the extremity were evaluated.    COMPARISON: None.    SEGMENTAL BP:  The right and left lower extremity segmental blood pressures are  fairly symmetric and unremarkable. 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 Technologist opted to not measure right lower extremity  ankle-brachial index.    ABI:  Right = not measured             Left = 1.2    RIGHT LOWER EXTREMITY:  Peak systolic velocities-- within normal limits. Normal  triphasic waveform throughout.  LEFT LOWER EXTREMITY:  Peak systolic velocities-- within normal limits. Normal  triphasic waveforms throughout.    ABDOMEN:  No aneurysm is seen.      Impression    No significant arterial disease identified.        Electronically signed by Luci Bank         XR CHEST PORTABLE   Final Result   No acute findings in the chest         Electronically signed by Margarite Gouge                   No results for input(s): "COVID19" in the last 72  hours.    Voice dictation software was used during the making of this note.  This software is not perfect and grammatical and other typographical errors may be present.  This note has not been completely proofread for errors.     Sandi Raveling, DO  05/21/23 1324

## 2023-05-22 ENCOUNTER — Observation Stay: Admit: 2023-05-22 | Discharge: 2023-05-29 | Payer: MEDICARE | Primary: Family Medicine

## 2023-05-22 LAB — CBC WITH AUTO DIFFERENTIAL
Basophils %: 1 % (ref 0.0–2.0)
Basophils Absolute: 0.07 10*3/uL (ref 0.00–0.20)
Eosinophils %: 9.2 % — ABNORMAL HIGH (ref 0.5–7.8)
Eosinophils Absolute: 0.67 10*3/uL (ref 0.00–0.80)
Hematocrit: 47.9 % (ref 41.1–50.3)
Hemoglobin: 16.5 g/dL (ref 13.6–17.2)
Immature Granulocytes %: 0.4 % (ref 0.0–5.0)
Immature Granulocytes Absolute: 0.03 10*3/uL (ref 0.0–0.5)
Lymphocytes %: 18.5 % (ref 13.0–44.0)
Lymphocytes Absolute: 1.34 10*3/uL (ref 0.50–4.60)
MCH: 30.6 pg (ref 26.1–32.9)
MCHC: 34.4 g/dL (ref 31.4–35.0)
MCV: 88.7 FL (ref 82–102)
MPV: 12 FL (ref 9.4–12.3)
Monocytes %: 9.8 % (ref 4.0–12.0)
Monocytes Absolute: 0.71 10*3/uL (ref 0.10–1.30)
Neutrophils %: 61.1 % (ref 43.0–78.0)
Neutrophils Absolute: 4.43 10*3/uL (ref 1.70–8.20)
Platelets: 69 10*3/uL — ABNORMAL LOW (ref 150–450)
RBC: 5.4 M/uL (ref 4.23–5.6)
RDW: 15.7 % — ABNORMAL HIGH (ref 11.9–14.6)
WBC: 7.3 10*3/uL (ref 4.3–11.1)
nRBC: 0 10*3/uL (ref 0.0–0.2)

## 2023-05-22 LAB — BASIC METABOLIC PANEL
Anion Gap: 10 mmol/L (ref 7–16)
BUN: 22 mg/dL (ref 8–23)
CO2: 22 mmol/L (ref 20–29)
Calcium: 8.9 mg/dL (ref 8.8–10.2)
Chloride: 105 mmol/L (ref 98–107)
Creatinine: 1.14 mg/dL (ref 0.80–1.30)
Est, Glom Filt Rate: 70 mL/min/{1.73_m2} (ref >60)
Glucose: 121 mg/dL — ABNORMAL HIGH (ref 70–99)
Potassium: 4.6 mmol/L (ref 3.5–5.1)
Sodium: 136 mmol/L (ref 136–145)

## 2023-05-22 LAB — POCT ACTIVATED CLOTTING TIME
Activated Clotting Time: 360 s — ABNORMAL HIGH (ref 70–128)
Activated Clotting Time: 308 s — ABNORMAL HIGH (ref 70–128)

## 2023-05-22 LAB — TEE (PRN CONTRAST/BUBBLE/3D): Body Surface Area: 2.57 m2

## 2023-05-22 LAB — MAGNESIUM: Magnesium: 1.7 mg/dL — ABNORMAL LOW (ref 1.8–2.4)

## 2023-05-22 MED ORDER — ONDANSETRON HCL 4 MG/2ML IJ SOLN
42 | INTRAMUSCULAR | Status: AC
Start: 2023-05-22 — End: ?

## 2023-05-22 MED ORDER — DEXAMETHASONE SODIUM PHOSPHATE 4 MG/ML IJ SOLN
4 | Freq: Once | INTRAMUSCULAR | Status: DC | PRN
Start: 2023-05-22 — End: 2023-05-22
  Administered 2023-05-22: 18:00:00 4 via INTRAVENOUS

## 2023-05-22 MED ORDER — PHENYLEPHRINE HCL (PRESSORS) 10 MG/ML IV SOLN
10 | Freq: Once | INTRAVENOUS | Status: DC | PRN
Start: 2023-05-22 — End: 2023-05-22
  Administered 2023-05-22 (×4): 200 via INTRAVENOUS
  Administered 2023-05-22: 20:00:00 100 via INTRAVENOUS
  Administered 2023-05-22: 19:00:00 200 via INTRAVENOUS

## 2023-05-22 MED ORDER — SUGAMMADEX SODIUM 200 MG/2ML IV SOLN
2002 | INTRAVENOUS | Status: AC
Start: 2023-05-22 — End: ?

## 2023-05-22 MED ORDER — GLYCOPYRROLATE 0.4 MG/2ML IJ SOLN
0.42 | INTRAMUSCULAR | Status: AC
Start: 2023-05-22 — End: ?

## 2023-05-22 MED ORDER — LIDOCAINE HCL (PF) 2 % IJ SOLN
2 | Freq: Once | INTRAMUSCULAR | Status: DC | PRN
Start: 2023-05-22 — End: 2023-05-22
  Administered 2023-05-22: 18:00:00 100 via INTRAVENOUS

## 2023-05-22 MED ORDER — NORMAL SALINE FLUSH 0.9 % IV SOLN
0.9 | Freq: Two times a day (BID) | INTRAVENOUS | Status: DC
Start: 2023-05-22 — End: 2023-05-23
  Administered 2023-05-23 (×2): 10 mL via INTRAVENOUS

## 2023-05-22 MED ORDER — GLYCOPYRROLATE 0.4 MG/2ML IJ SOLN
0.42 | Freq: Once | INTRAMUSCULAR | Status: DC | PRN
Start: 2023-05-22 — End: 2023-05-22
  Administered 2023-05-22: 19:00:00 .2 via INTRAVENOUS

## 2023-05-22 MED ORDER — LIDOCAINE HCL (PF) 2 % IJ SOLN
2 | INTRAMUSCULAR | Status: AC
Start: 2023-05-22 — End: ?

## 2023-05-22 MED ORDER — HEPARIN (PORCINE) IN NACL 2000-0.9 UNIT/L-% IV SOLN
2000-0.9- | INTRAVENOUS | Status: AC
Start: 2023-05-22 — End: ?

## 2023-05-22 MED ORDER — SODIUM CHLORIDE 0.9 % IV SOLN
0.9 | INTRAVENOUS | Status: DC | PRN
Start: 2023-05-22 — End: 2023-05-23

## 2023-05-22 MED ORDER — HEPARIN SOD (PORCINE) IN D5W 100 UNIT/ML IV SOLN
100 | INTRAVENOUS | Status: DC | PRN
Start: 2023-05-22 — End: 2023-05-22
  Administered 2023-05-22: 19:00:00 40 via INTRAVENOUS

## 2023-05-22 MED ORDER — LIDOCAINE-EPINEPHRINE (PF) 1 %-1:200000 IJ SOLN
1-1200000 | INTRAMUSCULAR | Status: AC
Start: 2023-05-22 — End: ?

## 2023-05-22 MED ORDER — HEPARIN SOD (PORCINE) IN D5W 100 UNIT/ML IV SOLN
100 | INTRAVENOUS | Status: AC
Start: 2023-05-22 — End: ?

## 2023-05-22 MED ORDER — HEPARIN SODIUM (PORCINE) 1000 UNIT/ML IJ SOLN
1000 | INTRAMUSCULAR | Status: AC
Start: 2023-05-22 — End: ?

## 2023-05-22 MED ORDER — PROPOFOL 200 MG/20ML IV EMUL
20020 | Freq: Once | INTRAVENOUS | Status: DC | PRN
Start: 2023-05-22 — End: 2023-05-22
  Administered 2023-05-22: 18:00:00 200 via INTRAVENOUS

## 2023-05-22 MED ORDER — ROCURONIUM BROMIDE 50 MG/5ML IV SOLN
505 | Freq: Once | INTRAVENOUS | Status: DC | PRN
Start: 2023-05-22 — End: 2023-05-22
  Administered 2023-05-22: 20:00:00 10 via INTRAVENOUS
  Administered 2023-05-22 (×2): 20 via INTRAVENOUS
  Administered 2023-05-22: 18:00:00 50 via INTRAVENOUS

## 2023-05-22 MED ORDER — PROPOFOL 200 MG/20ML IV EMUL
20020 | INTRAVENOUS | Status: AC
Start: 2023-05-22 — End: ?

## 2023-05-22 MED ORDER — HEPARIN SODIUM (PORCINE) 1000 UNIT/ML IJ SOLN
1000 | Freq: Once | INTRAMUSCULAR | Status: DC | PRN
Start: 2023-05-22 — End: 2023-05-22
  Administered 2023-05-22 (×2): 7500 via INTRAVENOUS

## 2023-05-22 MED ORDER — NORMAL SALINE FLUSH 0.9 % IV SOLN
0.9 | INTRAVENOUS | Status: DC | PRN
Start: 2023-05-22 — End: 2023-05-23

## 2023-05-22 MED ORDER — SUGAMMADEX SODIUM 200 MG/2ML IV SOLN
2002 | Freq: Once | INTRAVENOUS | Status: DC | PRN
Start: 2023-05-22 — End: 2023-05-22
  Administered 2023-05-22: 20:00:00 200 via INTRAVENOUS

## 2023-05-22 MED ORDER — DEXAMETHASONE SODIUM PHOSPHATE 4 MG/ML IJ SOLN
4 | INTRAMUSCULAR | Status: AC
Start: 2023-05-22 — End: ?

## 2023-05-22 MED ORDER — MAGNESIUM SULFATE 2000 MG/50 ML IVPB PREMIX
250 | Freq: Once | INTRAVENOUS | Status: AC
Start: 2023-05-22 — End: 2023-05-22
  Administered 2023-05-22: 15:00:00 2000 mg via INTRAVENOUS

## 2023-05-22 MED ORDER — LACTATED RINGERS IV SOLN
INTRAVENOUS | Status: DC | PRN
Start: 2023-05-22 — End: 2023-05-22
  Administered 2023-05-22 (×2): via INTRAVENOUS

## 2023-05-22 MED ORDER — ONDANSETRON HCL 4 MG/2ML IJ SOLN
42 | Freq: Once | INTRAMUSCULAR | Status: DC | PRN
Start: 2023-05-22 — End: 2023-05-22
  Administered 2023-05-22: 18:00:00 4 via INTRAVENOUS

## 2023-05-22 MED ORDER — ROCURONIUM BROMIDE 50 MG/5ML IV SOLN
505 | INTRAVENOUS | Status: AC
Start: 2023-05-22 — End: ?

## 2023-05-22 MED FILL — XARELTO 20 MG PO TABS: 20 MG | ORAL | Qty: 1

## 2023-05-22 MED FILL — VITAMIN B-6 50 MG PO TABS: 50 MG | ORAL | Qty: 0.5

## 2023-05-22 MED FILL — GLYCOPYRROLATE 0.4 MG/2ML IJ SOLN: 0.42 MG/2ML | INTRAMUSCULAR | Qty: 2

## 2023-05-22 MED FILL — MAGNESIUM SULFATE 2 GM/50ML IV SOLN: 250 GM/50ML | INTRAVENOUS | Qty: 50

## 2023-05-22 MED FILL — GABAPENTIN 300 MG PO CAPS: 300 MG | ORAL | Qty: 2

## 2023-05-22 MED FILL — HEPARIN (PORCINE) IN NACL 2000-0.9 UNIT/L-% IV SOLN: 2000-0.9- UNIT/L-% | INTRAVENOUS | Qty: 4000

## 2023-05-22 MED FILL — ZOLPIDEM TARTRATE 5 MG PO TABS: 5 MG | ORAL | Qty: 1

## 2023-05-22 MED FILL — HEPARIN SOD (PORCINE) IN D5W 100 UNIT/ML IV SOLN: 100 UNIT/ML | INTRAVENOUS | Qty: 250

## 2023-05-22 MED FILL — DOXYCYCLINE HYCLATE 100 MG PO CAPS: 100 MG | ORAL | Qty: 1

## 2023-05-22 MED FILL — ALLOPURINOL 300 MG PO TABS: 300 MG | ORAL | Qty: 1

## 2023-05-22 MED FILL — METOPROLOL SUCCINATE ER 50 MG PO TB24: 50 MG | ORAL | Qty: 1

## 2023-05-22 MED FILL — BRIDION 200 MG/2ML IV SOLN: 2002 MG/2ML | INTRAVENOUS | Qty: 2

## 2023-05-22 MED FILL — LISINOPRIL 20 MG PO TABS: 20 MG | ORAL | Qty: 2

## 2023-05-22 MED FILL — DIPRIVAN 200 MG/20ML IV EMUL: 20020 MG/20ML | INTRAVENOUS | Qty: 20

## 2023-05-22 MED FILL — XYLOCAINE-MPF 2 % IJ SOLN: 2 % | INTRAMUSCULAR | Qty: 5

## 2023-05-22 MED FILL — HEPARIN SODIUM (PORCINE) 1000 UNIT/ML IJ SOLN: 1000 UNIT/ML | INTRAMUSCULAR | Qty: 30

## 2023-05-22 MED FILL — XYLOCAINE-MPF/EPINEPHRINE 1 %-1:200000 IJ SOLN: 1-1200000 %-:200000 | INTRAMUSCULAR | Qty: 30

## 2023-05-22 MED FILL — CETIRIZINE HCL 10 MG PO TABS: 10 MG | ORAL | Qty: 1

## 2023-05-22 MED FILL — ROCURONIUM BROMIDE 50 MG/5ML IV SOLN: 505 MG/5ML | INTRAVENOUS | Qty: 5

## 2023-05-22 MED FILL — ACETAMINOPHEN 325 MG PO TABS: 325 MG | ORAL | Qty: 2

## 2023-05-22 MED FILL — DILTIAZEM HCL 100 MG IV SOLR: 100 MG | INTRAVENOUS | Qty: 1

## 2023-05-22 MED FILL — DEXAMETHASONE SODIUM PHOSPHATE 4 MG/ML IJ SOLN: 4 MG/ML | INTRAMUSCULAR | Qty: 1

## 2023-05-22 MED FILL — VITAMIN D3 10 MCG (400 UNIT) PO TABS: 10400 MCG (400 UNIT) | ORAL | Qty: 2

## 2023-05-22 MED FILL — ONDANSETRON HCL 4 MG/2ML IJ SOLN: 42 MG/2ML | INTRAMUSCULAR | Qty: 2

## 2023-05-22 NOTE — Plan of Care (Signed)
 Problem: Chronic Conditions and Co-morbidities  Goal: Patient's chronic conditions and co-morbidity symptoms are monitored and maintained or improved  05/22/2023 0908 by Lucillie Garfinkel, RN  Outcome: Progressing  Flowsheets (Taken 05/22/2023 0756)  Care Plan - Patient's Chronic Conditions and Co-Morbidity Symptoms are Monitored and Maintained or Improved:   Monitor and assess patient's chronic conditions and comorbid symptoms for stability, deterioration, or improvement   Collaborate with multidisciplinary team to address chronic and comorbid conditions and prevent exacerbation or deterioration   Update acute care plan with appropriate goals if chronic or comorbid symptoms are exacerbated and prevent overall improvement and discharge  05/22/2023 0208 by Earnie Larsson, RN  Outcome: Progressing     Problem: Discharge Planning  Goal: Discharge to home or other facility with appropriate resources  05/22/2023 0908 by Lucillie Garfinkel, RN  Outcome: Progressing  Flowsheets (Taken 05/22/2023 [phone])  Discharge to home or other facility with appropriate resources:   Identify barriers to discharge with patient and caregiver   Arrange for needed discharge resources and transportation as appropriate   Identify discharge learning needs (meds, wound care, etc)   Refer to discharge planning if patient needs post-hospital services based on physician order or complex needs related to functional status, cognitive ability or social support system  05/22/2023 0208 by Earnie Larsson, RN  Outcome: Progressing     Problem: Pain  Goal: Verbalizes/displays adequate comfort level or baseline comfort level  Outcome: Progressing  Flowsheets (Taken 05/22/2023 0756)  Verbalizes/displays adequate comfort level or baseline comfort level: Encourage patient to monitor pain and request assistance     Problem: Safety - Adult  Goal: Free from fall injury  Outcome: Progressing     Problem: ABCDS Injury Assessment  Goal: Absence of physical  injury  Outcome: Progressing

## 2023-05-22 NOTE — Wound Image (Addendum)
 Consulted for RLE. Pt currently OTF at Cath lab. Follows [facility], last seen 2/25; cleansing w/ Vashe, hydrofera blue, dry dressing x3/week. Will likely continue wound care regimen w/ hospital supplied substitutions. Pt is more than welcome to bring home supply of hydrofera blue as hospital does not supply. Would recommend wound cleanser, Opticell Ag, pink silicone border foam every other day&PRN.    Per Primary RN, dressing was changed last night by PM RN. Will reassess at another time if time allows. If not today, will reassess tomorrow.

## 2023-05-22 NOTE — Progress Notes (Signed)
 Met with patient's wife, who explained patient's absence. Chaplain said he would follow-up with patient and family as possible.

## 2023-05-22 NOTE — Plan of Care (Signed)
 Problem: Chronic Conditions and Co-morbidities  Goal: Patient's chronic conditions and co-morbidity symptoms are monitored and maintained or improved  05/22/2023 0208 by Earnie Larsson, RN  Outcome: Progressing  05/21/2023 1812 by Lucillie Garfinkel, RN  Outcome: Progressing  Flowsheets (Taken 05/21/2023 1620)  Care Plan - Patient's Chronic Conditions and Co-Morbidity Symptoms are Monitored and Maintained or Improved:   Monitor and assess patient's chronic conditions and comorbid symptoms for stability, deterioration, or improvement   Collaborate with multidisciplinary team to address chronic and comorbid conditions and prevent exacerbation or deterioration   Update acute care plan with appropriate goals if chronic or comorbid symptoms are exacerbated and prevent overall improvement and discharge     Problem: Discharge Planning  Goal: Discharge to home or other facility with appropriate resources  05/22/2023 0208 by Earnie Larsson, RN  Outcome: Progressing  05/21/2023 1812 by Lucillie Garfinkel, RN  Outcome: Progressing  Flowsheets (Taken 05/21/2023 1620)  Discharge to home or other facility with appropriate resources:   Identify barriers to discharge with patient and caregiver   Arrange for needed discharge resources and transportation as appropriate   Identify discharge learning needs (meds, wound care, etc)   Refer to discharge planning if patient needs post-hospital services based on physician order or complex needs related to functional status, cognitive ability or social support system     Problem: Pain  Goal: Verbalizes/displays adequate comfort level or baseline comfort level  05/21/2023 1812 by Lucillie Garfinkel, RN  Outcome: Progressing  Flowsheets (Taken 05/21/2023 1620)  Verbalizes/displays adequate comfort level or baseline comfort level:   Encourage patient to monitor pain and request assistance   Assess pain using appropriate pain scale     Problem: Safety - Adult  Goal: Free from fall injury  05/21/2023  1812 by Lucillie Garfinkel, RN  Outcome: Progressing     Problem: ABCDS Injury Assessment  Goal: Absence of physical injury  05/21/2023 1812 by Lucillie Garfinkel, RN  Outcome: Progressing

## 2023-05-22 NOTE — Anesthesia Pre-Procedure Evaluation (Signed)
 Department of Anesthesiology  Preprocedure Note       Name:  Chad Rosario   Age:  68 y.o.  DOB:  12/13/1955                                          MRN:  [id number]         Date:  05/22/2023      Surgeon: Moishe Spice):  Rhett Bannister, MD    Procedure: Procedure(s):  Ablation A-fib w complete ep study    Medications prior to admission:   Prior to Admission medications    Medication Sig Start Date End Date Taking? Authorizing Provider   zolpidem (AMBIEN) 5 MG tablet Take 1 tablet by mouth nightly as needed for Sleep for up to 90 days. Max Daily Amount: 5 mg 05/13/23 08/11/23 Yes [name], MD   lisinopril (PRINIVIL;ZESTRIL) 20 MG tablet Take 2 tablets by mouth daily 05/09/23  Yes de Burna Cash, MD   rivaroxaban (XARELTO) 20 MG TABS tablet Take 1 tablet by mouth daily (with breakfast) 04/20/23  Yes Lou Miner, PA   metoprolol succinate (TOPROL XL) 50 MG extended release tablet Take 1 tablet by mouth 2 times daily 04/19/23  Yes Lou Miner, PA   doxycycline hyclate (VIBRAMYCIN) 100 MG capsule Take 1 capsule by mouth 2 times daily 03/24/23  Yes [provider]   Cyanocobalamin (VITAMIN B 12 PO) Take by mouth every morning   Yes [provider]   allopurinol (ZYLOPRIM) 300 MG tablet TAKE 1 TABLET BY MOUTH DAILY 11/29/22  Yes [name], MD   Multiple Vitamins-Minerals (THERAPEUTIC MULTIVITAMIN-MINERALS) tablet Take 1 tablet by mouth daily   Yes [provider]   vitamin D3 (CHOLECALCIFEROL) 10 MCG (400 UNIT) TABS tablet Take 2 tablets by mouth daily   Yes [provider]   CPAP Machine MISC by Does not apply route   Yes [provider]   colchicine (MITIGARE) 0.6 MG capsule Take 1 capsule by mouth as needed for Pain 04/26/21  Yes Lulu Riding, PA-C   fexofenadine (ALLEGRA) 180 MG tablet Take 1 tablet by mouth daily   Yes [provider]   PYRIDOXINE HCL PO Take by mouth every morning  Patient not taking: Reported on  05/21/2023    [provider]   furosemide (LASIX) 40 MG tablet Take 1 tablet by mouth daily as needed (take one tablet daily for weight garin greater than 2 lbs in 24 hours or 4 lbs in 48 hours.) 04/19/23   Lou Miner, PA   gabapentin (NEURONTIN) 600 MG tablet Take 1 tablet by mouth in the morning, at noon, in the evening, and at bedtime.  Patient taking differently: Take 1 tablet by mouth in the morning, at noon, in the evening, and at bedtime. Patient taking tid 05/03/22 05/03/23  Cameron Proud, MD   Alpha-Lipoic Acid 200 MG CAPS Take 300 mg by mouth  Patient not taking: Reported on 05/21/2023    [provider]       Current medications:    Current Facility-Administered Medications   Medication Dose Route Frequency Provider Last Rate Last Admin    magnesium sulfate 2000 mg in 50 mL IVPB premix  2,000 mg IntraVENous Once Treasa School, APRN - CNP 25 mL/hr at 05/22/23 0948 2,000 mg at 05/22/23 7829  allopurinol (ZYLOPRIM) tablet 300 mg  300 mg Oral Daily Milana Huntsman, APRN - CNP   300 mg at 05/22/23 0756    zolpidem (AMBIEN) tablet 5 mg  5 mg Oral Nightly PRN Milana Huntsman, APRN - CNP   5 mg at 05/21/23 2204    cholecalciferol (VITAMIN D3) tablet TABS 800 Units  800 Units Oral Daily Milana Huntsman, APRN - CNP   800 Units at 05/22/23 8295    metoprolol succinate (TOPROL XL) extended release tablet 50 mg  50 mg Oral BID Milana Huntsman, APRN - CNP   50 mg at 05/22/23 0756    lisinopril (PRINIVIL;ZESTRIL) tablet 40 mg  40 mg Oral Daily Milana Huntsman, APRN - CNP   40 mg at 05/22/23 6213    gabapentin (NEURONTIN) capsule 600 mg  600 mg Oral TID Milana Huntsman, APRN - CNP   600 mg at 05/22/23 0865    furosemide (LASIX) tablet 40 mg  40 mg Oral Daily PRN Milana Huntsman, APRN - CNP        cetirizine (ZYRTEC) tablet 10 mg  10 mg Oral Daily Milana Huntsman, APRN - CNP   10 mg at 05/22/23 0756    sodium chloride flush 0.9 % injection 5-40 mL  5-40 mL IntraVENous 2 times per day Milana Huntsman,  APRN - CNP   10 mL at 05/22/23 0801    sodium chloride flush 0.9 % injection 5-40 mL  5-40 mL IntraVENous PRN Milana Huntsman, APRN - CNP        0.9 % sodium chloride infusion   IntraVENous PRN Milana Huntsman, APRN - CNP        ondansetron (ZOFRAN-ODT) disintegrating tablet 4 mg  4 mg Oral Q8H PRN Milana Huntsman, APRN - CNP        Or    ondansetron (ZOFRAN) injection 4 mg  4 mg IntraVENous Q6H PRN Milana Huntsman, APRN - CNP        polyethylene glycol (GLYCOLAX) packet 17 g  17 g Oral Daily PRN Milana Huntsman, APRN - CNP        acetaminophen (TYLENOL) tablet 650 mg  650 mg Oral Q6H PRN Milana Huntsman, APRN - CNP   650 mg at 05/21/23 2208    Or    acetaminophen (TYLENOL) suppository 650 mg  650 mg Rectal Q6H PRN Milana Huntsman, APRN - CNP        dilTIAZem 100 mg in sodium chloride 0.9 % 100 mL infusion ([employer])  5-15 mg/hr IntraVENous Continuous Milana Huntsman, APRN - CNP 5 mL/hr at 05/22/23 0756 5 mg/hr at 05/22/23 0756    vitamin B-6 (PYRIDOXINE) tablet 25 mg  25 mg Oral Daily Hendricks Limes, MD   25 mg at 05/22/23 0756    rivaroxaban (XARELTO) tablet 20 mg  20 mg Oral Dinner Treasa [employer], APRN - CNP   20 mg at 05/21/23 1729    doxycycline hyclate (VIBRAMYCIN) capsule 100 mg  100 mg Oral BID Treasa School, APRN - CNP   100 mg at 05/22/23 7846       Allergies:    Allergies   Allergen Reactions    Hydrocodone-Acetaminophen Anxiety and Itching    Losartan Shortness Of Breath    Amlodipine Other (See Comments)     Other reaction(s): Other (See Comments)  Fatigue   Fatigue       Rosuvastatin Other (See Comments)  Other reaction(s): Constipation  Fatigue, constipation  Other reaction(s): Fatigue    Atorvastatin Diarrhea    Ezetimibe      Other reaction(s): Constipation, Other (See Comments)  Constipation and decreased urine output  Constipation and decreased urine output  Other reaction(s): Constipation, Urine output low         Problem List:    Patient Active Problem List   Diagnosis Code     Obesity (BMI 30-39.9) E66.9    History of DVT (deep vein thrombosis) Z86.718    Hx of CABG Z95.1    Hypertension I10    Hyperlipidemia E78.5    DOE (dyspnea on exertion) R06.09    Thrombocytopenia D69.6    Neuropathic pain M79.2    Paresthesia R20.2    Bilateral leg weakness R29.898    Foot drop, bilateral M21.371, M21.372    Cramps of lower extremity R25.2    Acute medial meniscal tear S83.249A    Allergic rhinitis J30.9    Ataxia R27.0    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01    Insomnia G47.00    Low back pain M54.50    Lumbar facet joint pain M54.59    Lumbar postlaminectomy syndrome M96.1    Lumbar radiculopathy, chronic M54.16    Lumbar stenosis M48.061    Obstructive sleep apnea syndrome G47.33    Primary osteoarthritis of left hip M16.12    S/P angioplasty with stent Z95.820    Unsteadiness on feet R26.81    Volume overload E87.70    Bilateral lower extremity edema R60.0    Atrial fibrillation with RVR (HCC) I48.91    Congestive heart failure (HCC) I50.9    Persistent atrial fibrillation (HCC) I48.19    Chronic heart failure with preserved ejection fraction (HFpEF) (HCC) I50.32    Atrial fibrillation with rapid ventricular response (HCC) I48.91    Wound dehiscence, surgical T81.31XA    Lower extremity edema R60.0       Past Medical History:        Diagnosis Date    CAD (coronary artery disease) 2015    Gout      Hypertension     controlled with meds    Neuropathy May 2019    Osteoarthritis 03/2013    Sleep apnea     cpap at night       Past Surgical History:        Procedure Laterality Date    BACK SURGERY  2021    has had 3 different ones last was in 2021    CARDIAC SURGERY      CABG    CHOLECYSTECTOMY      COLONOSCOPY N/A 11/29/2020    COLONOSCOPY POLYPECTOMY HOT SNARE performed by Drucie Opitz, MD at [facility] ENDOSCOPY    EYE SURGERY Left     cataract  FRACTURE SURGERY Left     tib-fib fracture    JOINT REPLACEMENT  Hip - 2018    KNEE ARTHROSCOPY Right     TOTAL HIP ARTHROPLASTY Left        Social History:    Social History     Tobacco Use    Smoking status: Never    Smokeless tobacco: Never   Substance Use Topics    Alcohol use: Yes     Alcohol/week: 12.0 standard drinks of alcohol     Types: 6 Cans of beer, 6 Shots of liquor per week     Comment: socially                                Counseling given: Not Answered      Vital Signs (Current):   Vitals:    05/22/23 0600 05/22/23 0700 05/22/23 0800 05/22/23 0805   BP: 114/78 (!) 131/95 117/72 122/76   Pulse: 80 81 88 79   Resp:    16   Temp:    97.5 F (36.4 C)   TempSrc:    Oral   SpO2:    96%   Weight:       Height:                                                  BP Readings from Last 3 Encounters:   05/22/23 122/76   05/20/23 134/75   05/13/23 120/70       NPO Status:                                                                                 BMI:   Wt Readings from Last 3 Encounters:   05/22/23 129.8 kg (286 lb 3.2 oz)   05/20/23 131.1 kg (289 lb)   05/13/23 133.4 kg (294 lb 3.2 oz)     Body mass index is 37.76 kg/m.    CBC:   Lab Results   Component Value Date/Time    WBC 7.3 05/22/2023 05:52 AM    RBC 5.40 05/22/2023 05:52 AM    HGB 16.5 05/22/2023 05:52 AM    HCT 47.9 05/22/2023 05:52 AM    MCV 88.7 05/22/2023 05:52 AM    RDW 15.7 05/22/2023 05:52 AM    PLT 69 05/22/2023 05:52 AM       CMP:   Lab Results   Component Value Date/Time    NA 136 05/22/2023 05:52 AM     K 4.6 05/22/2023 05:52 AM    CL 105 05/22/2023 05:52 AM    CO2 22 05/22/2023 05:52 AM    BUN 22 05/22/2023 05:52 AM    CREATININE 1.14 05/22/2023 05:52 AM    GFRAA >60 12/18/2020 09:12 AM    LABGLOM 70 05/22/2023 05:52 AM    LABGLOM >60 05/28/2022 08:01 AM    GLUCOSE 121 05/22/2023 05:52 AM    GLUCOSE 127 11/21/2021 09:42 AM    CALCIUM 8.9 05/22/2023 05:52 AM  BILITOT 0.9 05/21/2023 10:28 AM    ALKPHOS 63 05/21/2023 10:28 AM    AST 53 05/21/2023 10:28 AM    ALT 48 05/21/2023 10:28 AM       POC Tests: No results for input(s): "POCGLU", "POCNA", "POCK", "POCCL", "POCBUN", "POCHEMO", "POCHCT" in the last 72 hours.    Coags:   Lab Results   Component Value Date/Time    PROTIME 20.5 01/16/2023 02:30 PM    INR 1.7 01/16/2023 02:30 PM       HCG (If Applicable): No results found for: "PREGTESTUR", "PREGSERUM", "HCG", "HCGQUANT"     ABGs: No results found for: "PHART", "PO2ART", "PCO2ART", "HCO3ART", "BEART", "O2SATART"     Type & Screen (If Applicable):  No results found for: "LABABO"    Drug/Infectious Status (If Applicable):  Lab Results   Component Value Date/Time    HEPCAB Non Reactive 01/16/2023 02:30 PM    HEPCAB NONREACTIVE 11/21/2021 09:42 AM       COVID-19 Screening (If Applicable): No results found for: "COVID19"        Anesthesia Evaluation  Patient summary reviewed and Nursing notes reviewed   no history of anesthetic complications:   Airway: Mallampati: II  TM distance: >3 FB   Neck ROM: full  Mouth opening: > = 3 FB   Dental: normal exam     Comment: Fillings    Pulmonary: breath sounds clear to auscultation  (+)   shortness of breath:   sleep apnea: on CPAP,                                  Cardiovascular:  Exercise tolerance: good (>4 METS)  (+) hypertension:, CAD:, CABG/stent (2vCABG):, dysrhythmias: atrial fibrillation, hyperlipidemia        Rhythm: irregular  Rate: normal                    Neuro/Psych:                ROS comment: Neuropathy, BLE weakness    Has hardware in back, currently with  broken screw GI/Hepatic/Renal:             Endo/Other:    (+) blood dyscrasia: thrombocytopenia and anticoagulation therapy, arthritis (gout):Marland Kitchen.                 Abdominal:             Vascular:   + DVT.       Other Findings:             Anesthesia Plan      general     ASA 3 - emergent     (GETA    Last dose Eliquis yesterday)  Induction: intravenous.    MIPS: Postoperative opioids intended.  Anesthetic plan and risks discussed with patient.      Plan discussed with CRNA.                    Patrecia Pour IV, MD   05/22/2023

## 2023-05-22 NOTE — Procedures (Signed)
 OPERATOR: Harriet Butte. Devona Konig, MD    REFERRING: Amelia Jo, MD    Pre-Electrophysiology Diagnosis  1. Atrial fibrillation, persistent  2. Atrial flutter     Procedure Performed  1. EPS afib ablation/pulmonary vein isolation  2. Left atrial pacing recording from the coronary sinus.  3. 3-D Electroanatomical mapping  4. Intracardiac echo  5. Ablation of second arrhythmia  6. LV pacing and recording  7. Transesophageal echo  8. Cardioversion    Anesthesia: General     Estimated Blood Loss: Less than 50 cc     Specimens: * No specimens in log *    Antibiotics: None    Complications: None    Fluoroscopy Time: 8.5 minutes     Procedure in Detail:  The patient was brought to the electrophysiology lab in the fasting state. The patient was intubated by anesthesiology. A tranesophageal echocardiogram was performed directly prior to the procedure and was negative for a LAA thrombus (see full report in chart).      A Ref-Star CARTO patch was placed, the patient was then prepped and draped in sterile fashion. Venous access was obtained under ultrasound guidance x3 using modified Seldinger technique, with placement of two 8 Fr short sidearm sheaths into the right femoral vein and placement of a 10 Fr sheath into the left femoral vein. One of the 8 Fr sheaths were upsized to an 8.5 Fr CadiaGuide (Biosense-Webster) sheath and eventually exchanged for a 17 Fr Faradrive sheath. The patient presented to the EP lab in atrial fibrillation. A synchronized cardioversion at 300 J was performed with return to NSR. An intra-cardiac echo probe was prepped, and then inserted into an 10 Fr short sheath and used to localize the fossa ovalis and create LA and pulmonary vein anatomy utilizing CARTO [employer]. A Biosense Webster Octaray catheter was then inserted into an 8 Fr sheath and used to create a 3D electroanatomical map of the right atrium, including attempts at His localization and the os of the CS with a focus on minimizing  fluoroscopic radiation. Then a Qdot porous 3.5 mm irrigated ablation catheter (Biosense-Webster) was used to map out the body of the CS. A decapolar Biosense Webster CS catheter was inserted via an 8 Fr sheath and positioned in the mid coronary sinus.     Next, a CTI ablation was performed using the Qdot ablation catheter. Linear ablation across the cavo-tricuspid isthmus was performed starting with 1:2 A:V EGM's along the isthmus at 6pm LAO. The local electrogram activation sequence, differential pacing maneuvers and electrogram timing was used to demonstrate bidirectional block along the cavotricuspid isthmus.     Post-Ablation trans-isthmus time: 170 msec  Local double potentials: 100 msec    Next, an 8 Fr short sheath was exchanged for an 8.5 Fr CardiaGuide sheath; and the sheath was advanced into the SVC over a wire with the dilator. The trans-septal needle was inserted into the CardiaGuide sheath and was used to perform a trans-septal puncture with assistance from ICE (intra-cardiac echo) as well as the LA Carto Sound map and the right atrial impedence map. The CardiaGuide sheath was advanced into the LA. Total weight based heparin bolus was administered just after transeptal access, and systemic blood pressure monitored invasively. The patient was then placed on our heparin weight based nomogram for targeted ACT of 300-350 during the ablation procedure. The Octaray catheter was then inserted into the LA via the CardiaGuide sheath and was used to map pulmonary vein potentials and target ablation.  Next, a wire was placed through the CardiaGuide sheath and advanced into the LUPV. The CardiaGuide sheath was exchanged for a 17 Fr Faradrive sheath. Next, the [facility] catheter was advanced over the wire into the left atrium. Glycopyrrolate 0.2 mg was injected to help reduce risk of heart block during left pulmonary vein ablation. The patient then underwent PV isolation for all 4 pulmonary veins with the  manufacturer specified dosing protocol of pulsed-field ablation using the [facility] system using both the flower and basket catheter positions. Catheter movements were guided by use of 3D mapping, ICE imaging and fluorosopy. PV isolation was noted for all four pulmonary veins during ablation. A total of 42 applications of PFA was performed during the procedure. After ablation, the Farawave catheter was exchanged for the Octaray catheter and the left atrium was remapped demonstrating stable PV isolation (Figure 1).      The Octaray catheter was inserted into the LV, documented LV electrograms and was used to pace the LV for the EP study. The Faradrive sheath and catheter were then removed from the left atrium.    Next, baseline recordings and a diagnostic EP study was performed. The previously performed CTI line demonstrated stable bidirectional block.     The coronary sinus multipolar catheter was used to pace the left atrium during the EP study. The LA CS electrograms were documented and interpreted during the procedure. A comprehensive EP study was performed with 1:1 AV decremental pacing, atrial extrastimuli and ventricular pacing to assess retrograde conduction. The patient did not have sustained slow pathway conduction or evidence of an accessory pathway. Ventricular pacing revealed retrograde AV conduction which was concentric and decremental.    At the completion of the ablation and EPS, all catheters were removed, 100 mg of Protamine was administered. Two VASCADE MVP devices were used to close the short 8 Fr and 10 Fr sheath venotomy sites. The Faradrive catheter was then removed and hemostasis was achieved with a figure of 8 suture and manual pressure.     The patient tolerated the procedure well with no acute complications recognized. The patient left the EP lab in stable condition, in normal sinus rhythm. Just prior to pulling shealths, the ICE catheter was used to obtain ultrasound images and revealed no  evidence of pericardial effusion.    ABLATION DATA:  Total procedure time: 115 minutes     Baseline Intervals:  AH interval: 97 msec  HV interval: 53 msec  PR interval: 216 msec  QRS duration: 91 msec  QT interval: 416 msec  RR interval: 989 msec    EP Study  AV [place]: 400 msec  AV nodal ERP: < or equal to 600/330 msec  VA [place]bach: 380 msec    Figure 1. 3D electroanatomic map of the posterior left atrium pre and post AF ablation.       Figure 2. Baseline ECG tracing.     Plan of care: The patient will be placed in the same-day discharge protocol, 3-4 hour flat time, followed by ambulation as tolerated and will continue anticoagulation as prescribed pre-procedure.    Impression:   1.  Successful pulmonary vein isolation (PVAI) of the 4 pulmonary veins.  2.  Successful ablation of CTI-dependent atrial flutter.  3.  Comprehensive EP study with normal intra-atrial, AV nodal and infrahissian conduction times.    Plan:   1.  Continue OAC (Xarelto) indefinitely until EP follow up.  2.  Continue AAD (metoprolol) for at least 4-6 weeks with further direction on  use in follow up.  3.  Family updated with plan.  4.  Routine cardiology follow up with Dr. Freddi Starr.  5.  Patient and family updated about small risk of atrioesophageal fistula and need for emergent ED evaluation if any concerning symptoms arise.    Harriet Butte. Devona Konig, MD, MS  Clinical Cardiac Electrophysiology  [facility]   05/22/2023  3:53 PM

## 2023-05-22 NOTE — Anesthesia Post-Procedure Evaluation (Signed)
 Department of Anesthesiology  Postprocedure Note    Patient: Chad Rosario  MRN: [id number]  Birthdate: Oct 05, 1955  Date of evaluation: 05/22/2023    Procedure Summary       Date: 05/22/23 Room / Location: [facility] EP 2 ALL EVENTS / [facility]ARDI[facility] CATH LAB    Anesthesia Start: 1301 Anesthesia Stop: 1529    Procedure: Ablation A-fib w complete ep study Diagnosis:       Persistent atrial fibrillation (HCC)      (A-fib (HCC) [I48.91])    Providers: Rhett Bannister, MD Responsible Provider: Neta Mends, MD    Anesthesia Type: general ASA Status: 3 - Emergent            Anesthesia Type: No value filed.    Aldrete Phase I: Aldrete Score: 10    Aldrete Phase II:      Anesthesia Post Evaluation    Patient location during evaluation: PACU  Patient participation: complete - patient participated  Level of consciousness: awake and alert  Airway patency: patent  Nausea & Vomiting: no nausea and no vomiting  Cardiovascular status: hemodynamically stable  Respiratory status: acceptable, nonlabored ventilation and spontaneous ventilation  Hydration status: euvolemic  Comments: BP (!) 147/76   Pulse 73   Temp 98.1 F (36.7 C) (Infrared)   Resp 17   Ht 1.854 m (6\' 1" )   Wt 129.8 kg (286 lb 3.2 oz)   SpO2 96%   BMI 37.76 kg/m     Multimodal analgesia pain management approach  Pain management: adequate and satisfactory to patient    There were no known notable events for this encounter.

## 2023-05-22 NOTE — Plan of Care (Signed)
 Problem: Chronic Conditions and Co-morbidities  Goal: Patient's chronic conditions and co-morbidity symptoms are monitored and maintained or improved  05/22/2023 2232 by Freddy Finner, RN  Outcome: Progressing  05/22/2023 0908 by Lucillie Garfinkel, RN  Outcome: Progressing  Flowsheets (Taken 05/22/2023 0756)  Care Plan - Patient's Chronic Conditions and Co-Morbidity Symptoms are Monitored and Maintained or Improved:   Monitor and assess patient's chronic conditions and comorbid symptoms for stability, deterioration, or improvement   Collaborate with multidisciplinary team to address chronic and comorbid conditions and prevent exacerbation or deterioration   Update acute care plan with appropriate goals if chronic or comorbid symptoms are exacerbated and prevent overall improvement and discharge     Problem: Discharge Planning  Goal: Discharge to home or other facility with appropriate resources  05/22/2023 2232 by Freddy Finner, RN  Outcome: Progressing  05/22/2023 0908 by Lucillie Garfinkel, RN  Outcome: Progressing  Flowsheets (Taken 05/22/2023 [phone])  Discharge to home or other facility with appropriate resources:   Identify barriers to discharge with patient and caregiver   Arrange for needed discharge resources and transportation as appropriate   Identify discharge learning needs (meds, wound care, etc)   Refer to discharge planning if patient needs post-hospital services based on physician order or complex needs related to functional status, cognitive ability or social support system     Problem: Pain  Goal: Verbalizes/displays adequate comfort level or baseline comfort level  05/22/2023 2232 by Freddy Finner, RN  Outcome: Progressing  05/22/2023 0908 by Lucillie Garfinkel, RN  Outcome: Progressing  Flowsheets (Taken 05/22/2023 0756)  Verbalizes/displays adequate comfort level or baseline comfort level: Encourage patient to monitor pain and request assistance     Problem: Safety - Adult  Goal: Free  from fall injury  05/22/2023 2232 by Freddy Finner, RN  Outcome: Progressing  05/22/2023 0908 by Lucillie Garfinkel, RN  Outcome: Progressing     Problem: ABCDS Injury Assessment  Goal: Absence of physical injury  05/22/2023 2232 by Freddy Finner, RN  Outcome: Progressing  05/22/2023 0908 by Lucillie Garfinkel, RN  Outcome: Progressing

## 2023-05-22 NOTE — Other (Signed)
 TRANSFER - OUT REPORT:    Verbal report given to [place], RN on [name]  being transferred to 404 for routine post-op       Report consisted of patient's Situation, Background, Assessment and   Recommendations(SBAR).     Information from the following report(s) Nurse Handoff Report, Adult Overview, Surgery Report, Cardiac Rhythm NSR, Quality Measures, and Neuro Assessment was reviewed with the receiving nurse.    Lines:   Peripheral IV 05/21/23 Left Antecubital (Active)   Site Assessment Clean, dry & intact 05/22/23 1600   Line Status Flushed 05/22/23 1600   Line Care Cap changed 05/22/23 0756   Phlebitis Assessment No symptoms 05/22/23 1600   Infiltration Assessment 0 05/22/23 1600   Alcohol Cap Used Yes 05/22/23 0756   Dressing Status Clean, dry & intact 05/22/23 1600   Dressing Type Transparent 05/22/23 1600       Peripheral IV 05/21/23 Right;Anterior Forearm (Active)   Site Assessment Clean, dry & intact 05/22/23 1600   Line Status Flushed 05/22/23 1600   Line Care Connections checked and tightened 05/22/23 0756   Phlebitis Assessment No symptoms 05/22/23 1600   Infiltration Assessment 0 05/22/23 1600   Alcohol Cap Used Yes 05/22/23 0756   Dressing Status Clean, dry & intact 05/22/23 1600   Dressing Type Transparent 05/22/23 1600        Opportunity for questions and clarification was provided.      Patient transported with:   Monitor  Registered Nurse    VTE prophylaxis orders have been written for [employer] [place].    Patient and family given floor number and nurses name.  Family updated re: pt status after security code verified.

## 2023-05-22 NOTE — Progress Notes (Signed)
 TRANSFER - IN REPORT:    Verbal report received from [facility] on Chad Rosario  being received from PACU for routine progression of patient care      Report consisted of patient's Situation, Background, Assessment and   Recommendations(SBAR).     Information from the following report(s) Nurse Handoff Report, Adult Overview, Surgery Report, Cardiac Rhythm NSR, and Neuro Assessment was reviewed with the receiving nurse.    Opportunity for questions and clarification was provided.      Assessment completed upon patient's arrival to unit and care assumed.

## 2023-05-22 NOTE — Consults (Signed)
 [facility]  [address], [address]  [place], [place] [address]  PHONE: [phone]        05/22/23      NAME:  Chad Rosario  DOB: 1956-01-29  MRN: [id number]     Referring Cardiologist: Hendricks Limes, MD and Amelia Jo, MD    Reason for Consultation: Atrial fibrillation     ASSESSMENT and PLAN:  Atrial fibrillation, mildly persistent, CHADS2VASc = 4  HFpEF  CAD s/p CABG  Thrombocytopenia  HTN  HLD  DVT    68 year old male with a history of mildly persistent AF admitted overnight with recurrent severely symptomatic AF with RVR. We discussed treatment options as noted below. The patient was motivated to undergo AF ablation. We will proceed with the catheter ablation procedure under [place].    AF, persistent   -Continue IV diltiazem.  -Continue BB.   -Plan for AF ablation   -Risks include use of OAC, low platelets, CAD, HTN, HFpEF    Stroke ppx  -Continue Xarelto.     HFpEF  -Continue home medical therapy.  -IV diuresis.   -Plan for AF ablation.     DVT  -Long term use of OAC. Continue Xarelto.     Thrombocytopenia  -currently undergoing evaluation with hematology. Has been >50K and stable. Discussed with pt and reviewed risks, he is interested in undergoing ablation despite these risks.     Dispo  -Ongoing inpt care.     AF ABLATION EDUCATION (done today):  I discussed with the patient the pathophysiology of atrial fibrillation, including details about potential triggers from the pulmonary veins (PVs), as well as non-PV sites.  We also discussed that in certain patients (especially those with more persistent AF) there is often atrial substrate (i.e. fibrosis, dilatation, etc.) that promotes more sustained AF. We also discussed the therapeutic options for treatment of atrial fibrillation, including:  --Rate control   --Rhythm control with antiarrhythmic drugs (AAD) and supplemental rate control  --Catheter ablation, including pulmonary vein isolation (PVI), with or without additional  substrate modification, in attempt to maintain sinus rhythm long-term  --AV nodal ablation with a permanent pacemaker (ablate & pace).    I emphasized to the patient that all of these options require stroke prophylaxis with oral anticoagulation therapy (OAC) with  Coumadin or novel oral anticoagulant (NOACs), depending on risk factors. I explained that successful catheter ablation with sufficient long-term follow-up documenting a lack of recurrent AF may allow for discontinuation of anticoagulation in the future; however, this has not been proven safe in prospective clinical studies and is still considered experimental.  Data from retrospective trials, however, has suggested that stopping oral anticoagulation after successful ablation does not result in increased in stroke rates and appears to be safe.    The benefits and risks of each of these approaches were outlined in detail.  We discussed the variable success rates of AF ablation, which can range from 50-80+%, depending on multiple factors, including paroxysmal vs. persistent AF, duration of AF, AF burden, left atrial size and remodeling, concomitant valvular or other structural heart disease, non-PV triggers, prior ablation lesion sets, obstructive sleep apnea, and other potential confounding factors.  I also explained that often (approximately 30-50% of the time) patients will require multiple procedures to achieve long-term AF suppression.  Additionally, we discussed that ablation may decrease AF burden and/or symptoms, but additional therapeutic strategies (i.e. concomitant AAD therapy, rate controlling medications, oral anticoagulants, etc.) may be needed long term for AF management.  The catheter ablation procedure was described to the patient in detail, including the risks of recurrent AF/AT, need for repeat ablation, esophageal perforation/fistula, pulmonary vein stenosis,  bronchial injury/fistula, stroke, cardiac perforation with the need for  catheter drainage or surgical repair, vascular damage, DVT/PE, bleeding, thermal skin burns, radiation skin injury, kidney failure, pneumo/hemothorax, need for permanent pacemaker, phrenic or vagus nerve damage resulting in diaphragmatic paralysis or gastroparesis, stiff left atrial syndrome, and even death.      We also discussed in detail today the options for anticoagulation for AF , including the periprocedure period.  Published data (the ROCKET-AF trial, NEJM 2011) suggests that rivaroxiban is non-inferior to warfarin for stroke prevention in AF, and may also be safer.  Also, using Rivaroxaban will allow [place] to avoid routine lab monitoring as well as many drug-drug and food-drug interactions. Additionally, using Rivaroxaban will allow [place] to avoid using LMWH periprocedurally, which may reduce bleeding and hematomas.  The patient is aware of these risks/benefits and wishes to proceed with using Rivaroxaban periprocedurally.    TOTAL TIME: 60 minutes, >50% during counseling and coordination of care     Thank you for allowing me to participate in the electrophysiologic care of Mr. [name] [facility]. Please contact me if any questions or concerns were to arise.    Harriet [place]. Shon Mansouri MD, MS  Clinical Cardiac Electrophysiology  [facility]  05/22/23  7:12 AM    ===================================================================  Chief Complant:    Chief Complaint   Patient presents with    Chest Pain    Shortness of Breath        Consultation is requested by Hendricks Limes, MD, for evaluation of Chest Pain and Shortness of Breath    History:  [name] is a most pleasant 68 y.o. male with a past medical and cardiac history significant for chronic HFpEF, persistent atrial fibrillation, TAA, CAD status post CABG in 2015, statin intolerance, hypertension, hyperlipidemia, secondary erythrocytosis, chronic venous insufficiency and DVT who presented to ED with irregular heart beat. He is  followed by Dr. Freddi Starr. He presented to clinic at that time with DOE with an ECG consistent with AF with RVR in January. Similar presentation this time around. AF was newly diagnosed in January. He was directly admitted to the hospital. He underwent IV diuresis with documented -5 L. He had a TTE in January 2025 that noted a normal EF and mild MR. His ascending aorta was noted to be 4 cm. He was noted to have a moderately dilated LA. 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 He does have issues with a chronically low platelet count to which he sees hematology due to chronic liver disease?    Cardiac PMH: (Old records have been reviewed and summarized below)    EKG:  (EKG has been independently visualized by me with interpretation below): Atrial fibrillation with RVR, normal axis, NSST changes.     ECHO: 03/2023  Left Ventricle Normal left ventricular systolic function with a visually estimated EF of 55 - 60%. Left ventricle size is normal. Normal wall thickness. Normal wall motion. Indeterminate diastolic function due to atrial fibrillation.   Left Atrium Left atrium is moderately dilated. LA Vol Index is  39-45 ml/m2.   Right Ventricle Right ventricle size is normal. Normal systolic function. TAPSE is 1.9 cm.   Right Atrium Right atrium is mildly dilated.   Aortic Valve Moderately thickened cusps. Mildly calcified cusps.  Moderate sclerosis of the aortic valve cusps. No regurgitation. No stenosis.   Mitral Valve Moderate annular calcification. Mildly thickened, at the anterior leaflet. Mild regurgitation. No stenosis noted.   Tricuspid Valve Valve structure is normal. Trace regurgitation. No stenosis noted. Unable to assess RVSP due to inadequate or insignificant tricuspid regurgitation.   Pulmonic Valve Valve structure is normal. Physiologically normal regurgitation. No stenosis noted.   Aorta Normal sized aortic root. Ao root diameter is 3.2 cm.  Mildly dilated ascending aorta. Ao ascending diameter is 4.0 cm.   IVC/Hepatic Veins IVC diameter is less than or equal to 21 mm and decreases greater than 50% during inspiration; therefore the estimated right atrial pressure is normal (~3 mmHg). IVC size is normal.   Pericardium No pericardial effusion.     Previous Heart Catheterization: n/a     Stress Test: n/a     DEVICE INTERROGATION: n/a      Past Medical History, Past Surgical History, Family history, Social History, and Medications were all reviewed with the patient today and updated as necessary.     Current Facility-Administered Medications   Medication Dose Route Frequency Provider Last Rate Last Admin    allopurinol (ZYLOPRIM) tablet 300 mg  300 mg Oral Daily Milana Huntsman, APRN - CNP        zolpidem (AMBIEN) tablet 5 mg  5 mg Oral Nightly PRN Milana Huntsman, APRN - CNP   5 mg at 05/21/23 2204    cholecalciferol (VITAMIN D3) tablet TABS 800 Units  800 Units Oral Daily Milana Huntsman, APRN - CNP        metoprolol succinate (TOPROL XL) extended release tablet 50 mg  50 mg Oral BID Milana Huntsman, APRN - CNP   50 mg at 05/21/23 2007    lisinopril (PRINIVIL;ZESTRIL) tablet 40 mg  40 mg Oral Daily Milana Huntsman, APRN - CNP        gabapentin (NEURONTIN) capsule 600 mg  600 mg Oral TID Milana Huntsman, APRN - CNP   600 mg at 05/21/23 2007    furosemide (LASIX) tablet 40 mg  40 mg Oral Daily PRN Milana Huntsman, APRN - CNP        cetirizine (ZYRTEC) tablet 10 mg  10 mg Oral Daily Milana Huntsman, APRN - CNP        sodium chloride flush 0.9 % injection 5-40 mL  5-40 mL IntraVENous 2 times per day Milana Huntsman, APRN - CNP   10 mL at 05/21/23 2008    sodium chloride flush 0.9 % injection 5-40 mL  5-40 mL IntraVENous PRN Milana Huntsman, APRN - CNP        0.9 % sodium chloride infusion   IntraVENous PRN Milana Huntsman, APRN - CNP        ondansetron (ZOFRAN-ODT) disintegrating tablet 4 mg  4 mg Oral Q8H PRN Milana Huntsman, APRN - CNP        Or    ondansetron (ZOFRAN)  injection 4 mg  4 mg IntraVENous Q6H PRN Milana Huntsman, APRN - CNP        polyethylene glycol (GLYCOLAX) packet 17 g  17 g Oral Daily PRN Milana Huntsman, APRN - CNP        acetaminophen (TYLENOL) tablet 650 mg  650 mg Oral Q6H PRN Milana Huntsman, APRN - CNP   650 mg at 05/21/23 2208    Or    acetaminophen (TYLENOL) suppository 650 mg  650 mg Rectal Q6H PRN Milana Huntsman, APRN - CNP        dilTIAZem 100 mg in sodium chloride 0.9 % 100 mL infusion ([employer])  5-15 mg/hr IntraVENous Continuous Milana Huntsman, APRN - CNP 5 mL/hr at 05/22/23 1610 5 mg/hr at 05/22/23 9604    vitamin B-6 (PYRIDOXINE) tablet 25 mg  25 mg Oral Daily Hendricks Limes, MD        rivaroxaban Carlena Hurl) tablet 20 mg  20 mg Oral [address] [place] D, APRN - CNP   20 mg at 05/21/23 1729    doxycycline hyclate (VIBRAMYCIN) capsule 100 mg  100 mg Oral BID [employer], APRN - CNP   100 mg at 05/21/23 2007     Allergies   Allergen Reactions    Hydrocodone-Acetaminophen Anxiety and Itching    Losartan Shortness Of Breath    Amlodipine Other (See Comments)     Other reaction(s): Other (See Comments)  Fatigue   Fatigue       Rosuvastatin Other (See Comments)     Other reaction(s): Constipation  Fatigue, constipation  Other reaction(s): Fatigue    Atorvastatin Diarrhea    Ezetimibe      Other reaction(s): Constipation, Other (See Comments)  Constipation and decreased urine output  Constipation and decreased urine output  Other reaction(s): Constipation, Urine output low         Past Medical History:   Diagnosis Date    CAD (coronary artery disease) 2015    Gout     Hypertension     controlled with meds    Neuropathy May 2019    Osteoarthritis 03/2013    Sleep apnea     cpap at night     Past Surgical History:   Procedure Laterality Date    BACK SURGERY  2021    has had 3 different ones last was in 2021    CARDIAC SURGERY      CABG    CHOLECYSTECTOMY      COLONOSCOPY N/A 11/29/2020    COLONOSCOPY POLYPECTOMY HOT SNARE performed by  Drucie Opitz, MD at [facility] ENDOSCOPY    EYE SURGERY Left     cataract    FRACTURE SURGERY Left     tib-fib fracture    JOINT REPLACEMENT  Hip - 2018    KNEE ARTHROSCOPY Right     TOTAL HIP ARTHROPLASTY Left      Family History   Problem Relation Age of Onset    Cancer Mother         Lymp    Stroke Father     Cancer Sister         Breast    Clotting Disorder Maternal Grandfather      Social History     Tobacco Use    Smoking status: Never    Smokeless tobacco: Never   Substance Use Topics    Alcohol use: Yes     Alcohol/week: 12.0 standard drinks of alcohol     Types: 6 Cans of beer, 6 Shots of liquor per week     Comment: socially     ROS:  A comprehensive review of systems was performed with the pertinent positives and negatives as noted in the HPI in addition to:  Review of Systems   Constitutional:  Positive for fatigue.   HENT: Negative.     Eyes: Negative.    Respiratory:  Positive for shortness of breath.    Cardiovascular:  Positive for palpitations.  Gastrointestinal: Negative.    Endocrine: Negative.    Genitourinary: Negative.    Musculoskeletal: Negative.    Skin: Negative.    Allergic/Immunologic: Negative.    Neurological: Negative.    Hematological: Negative.    Psychiatric/Behavioral: Negative.     All other systems reviewed and are negative.    PHYSICAL EXAM:   BP 114/78   Pulse 80   Temp 97 F (36.1 C)   Resp 20   Ht 1.854 m (6\' 1" )   Wt 129.8 kg (286 lb 3.2 oz)   SpO2 96%   BMI 37.76 kg/m      Wt Readings from Last 3 Encounters:   05/22/23 129.8 kg (286 lb 3.2 oz)   05/20/23 131.1 kg (289 lb)   05/13/23 133.4 kg (294 lb 3.2 oz)     BP Readings from Last 3 Encounters:   05/22/23 114/78   05/20/23 134/75   05/13/23 120/70     Gen: Well appearing, well developed, no acute distress  Eyes: Pupils equal, round. Extraocular movements are intact  ENT: Oropharynx clear, no oral lesions, normal dentition  CV: S1S2, IRRR, no murmurs, rubs or gallops, normal JVD, no carotid bruits, normal distal pulses,  no LEE  Pulm: Clear to auscultation bilaterally, no accessory muscle uses, no wheezes or rales  GI: Soft, NT, ND, +BS  Neuro: Alert and oriented, nonfocal  Psych: Appropriate affect  Skin: Normal color and skin turgor  MSK: Normal muscle bulk and tone    Medical problems and test results were reviewed with the patient today.
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
## 2023-05-22 NOTE — Care Coordination-Inpatient (Addendum)
 Pt admitted overnight with recurrent severely symptomatic AFib RVR. Medical hx includes: mildly persistent AFib. Is s/p ablation today. PTA, pt lives with his spouse. A/Ox4. Drives. Indep with his ADLs at baseline. On RA. Wears a CPAP at night, reports compliance. Has a RW for ambulation assistance but does not use. Denies current [facility] services and h/o recent falls. PCP established. [place] Medicare verified and able to afford home. No anticipated discharge needs identified by CM, will remain available.     05/22/23 1255   Service Assessment   Patient Orientation Alert and Oriented;Person;Place;Situation;Self   Cognition Alert   History Provided By Patient   Primary Caregiver Self   Support Systems Spouse/Significant Other;Children;Family Members;Church/Faith Community;Friends/Neighbors   Patient's Healthcare Decision Maker is: Legal Next of Kin   PCP Verified by CM Yes   Last Visit to PCP Within last 3 months   Prior Functional Level Independent in ADLs/IADLs   Current Functional Level Independent in ADLs/IADLs   Can patient return to prior living arrangement Yes   Ability to make needs known: Good   Family able to assist with home care needs: Yes   Would you like for me to discuss the discharge plan with any other family members/significant others, and if so, who? 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 No   Potential Assistance  Purchasing Medications No   Type of Home Care Services None   Patient expects to be discharged to: [facility] At/After Discharge   Transition of Care Consult (CM Consult) Discharge Planning   [facility] Information Provided? No   Mode of Transport at Discharge Other (see comment)  (Family)   Confirm Follow Up Transport Family

## 2023-05-23 LAB — CBC WITH AUTO DIFFERENTIAL
Basophils %: 0.1 % (ref 0.0–2.0)
Basophils Absolute: 0.02 10*3/uL (ref 0.00–0.20)
Eosinophils %: 0.1 % — ABNORMAL LOW (ref 0.5–7.8)
Eosinophils Absolute: 0.01 10*3/uL (ref 0.00–0.80)
Hematocrit: 46.4 % (ref 41.1–50.3)
Hemoglobin: 16.4 g/dL (ref 13.6–17.2)
Immature Granulocytes %: 0.6 % (ref 0.0–5.0)
Immature Granulocytes Absolute: 0.09 10*3/uL (ref 0.0–0.5)
Lymphocytes %: 6.6 % — ABNORMAL LOW (ref 13.0–44.0)
Lymphocytes Absolute: 0.97 10*3/uL (ref 0.50–4.60)
MCH: 30.5 pg (ref 26.1–32.9)
MCHC: 35.3 g/dL — ABNORMAL HIGH (ref 31.4–35.0)
MCV: 86.4 FL (ref 82–102)
MPV: 12.6 FL — ABNORMAL HIGH (ref 9.4–12.3)
Monocytes %: 4.1 % (ref 4.0–12.0)
Monocytes Absolute: 0.61 10*3/uL (ref 0.10–1.30)
Neutrophils %: 88.5 % — ABNORMAL HIGH (ref 43.0–78.0)
Neutrophils Absolute: 13.02 10*3/uL — ABNORMAL HIGH (ref 1.70–8.20)
Platelets: 86 10*3/uL — ABNORMAL LOW (ref 150–450)
RBC: 5.37 M/uL (ref 4.23–5.6)
RDW: 15.8 % — ABNORMAL HIGH (ref 11.9–14.6)
WBC: 14.7 10*3/uL — ABNORMAL HIGH (ref 4.3–11.1)
nRBC: 0 10*3/uL (ref 0.0–0.2)

## 2023-05-23 LAB — EKG 12-LEAD
Atrial Rate: 81 {beats}/min
P Axis: 90 degrees
P-R Interval: 190 ms
Q-T Interval: 422 ms
QRS Duration: 84 ms
QTc Calculation (Bazett): 490 ms
R Axis: -5 degrees
T Axis: 79 degrees
Ventricular Rate: 81 {beats}/min

## 2023-05-23 LAB — ELECTROPHYSIOLOGY PROCEDURE: Body Surface Area: 2.57 m2

## 2023-05-23 LAB — MAGNESIUM: Magnesium: 1.8 mg/dL (ref 1.8–2.4)

## 2023-05-23 MED ORDER — HEPARIN (PORCINE) IN NACL 2000-0.9 UNIT/L-% IV SOLN
2000-0.9- | INTRAVENOUS | Status: DC | PRN
Start: 2023-05-23 — End: 2023-05-23
  Administered 2023-05-22: 18:00:00 3 via INTRAVENOUS

## 2023-05-23 MED FILL — DOXYCYCLINE HYCLATE 100 MG PO CAPS: 100 MG | ORAL | Qty: 1

## 2023-05-23 MED FILL — ALLOPURINOL 300 MG PO TABS: 300 MG | ORAL | Qty: 1

## 2023-05-23 MED FILL — METOPROLOL SUCCINATE ER 50 MG PO TB24: 50 MG | ORAL | Qty: 1

## 2023-05-23 MED FILL — CETIRIZINE HCL 10 MG PO TABS: 10 MG | ORAL | Qty: 1

## 2023-05-23 MED FILL — ZOLPIDEM TARTRATE 5 MG PO TABS: 5 MG | ORAL | Qty: 1

## 2023-05-23 MED FILL — VITAMIN B-6 50 MG PO TABS: 50 MG | ORAL | Qty: 0.5

## 2023-05-23 MED FILL — GABAPENTIN 300 MG PO CAPS: 300 MG | ORAL | Qty: 2

## 2023-05-23 MED FILL — LISINOPRIL 20 MG PO TABS: 20 MG | ORAL | Qty: 2

## 2023-05-23 MED FILL — XARELTO 20 MG PO TABS: 20 MG | ORAL | Qty: 1

## 2023-05-23 MED FILL — ACETAMINOPHEN 325 MG PO TABS: 325 MG | ORAL | Qty: 2

## 2023-05-23 NOTE — Progress Notes (Signed)
 Spiritual Health History and Assessment/Progress Note  [facility]    (P) Spiritual/Emotional Needs,  ,  ,      Name: Chad Rosario MRN: [id number]    Age: 68 y.o.     Sex: male   Language: English   Religion: Christian   Atrial fibrillation with rapid ventricular response (HCC)     Date: 05/23/2023            Total Time Calculated: (P) 10 min              Spiritual Assessment began in [facility] 4 TELEMETRY        Referral/Consult From: (P) Rounding   Encounter Overview/Reason: (P) Spiritual/Emotional Needs  Service Provided For: (P) Patient    Faith, Belief, Meaning:   Patient identifies as spiritual  Family/Friends No family/friends present      Importance and Influence:  Patient has no beliefs influential to healthcare decision-making identified during this visit  Family/Friends No family/friends present    Community:  Patient feels well-supported. Support system includes: Spouse/Partner and Children  Family/Friends No family/friends present    Assessment and Plan of Care:     Patient Interventions include: Facilitated expression of thoughts and feelings and Affirmed coping skills/support systems  Family/Friends Interventions include: No family/friends present    Patient Plan of Care: Spiritual Care available upon further referral  Family/Friends Plan of Care: No family/friends present    Electronically signed by Gabriela Eves, BCC on 05/23/2023 at 10:35 AM

## 2023-05-23 NOTE — Plan of Care (Signed)
 Problem: Chronic Conditions and Co-morbidities  Goal: Patient's chronic conditions and co-morbidity symptoms are monitored and maintained or improved  05/23/2023 0804 by Raul Del, RN  Outcome: Progressing  05/22/2023 2232 by Freddy Finner, RN  Outcome: Progressing  Flowsheets (Taken 05/22/2023 2145)  Care Plan - Patient's Chronic Conditions and Co-Morbidity Symptoms are Monitored and Maintained or Improved:   Monitor and assess patient's chronic conditions and comorbid symptoms for stability, deterioration, or improvement   Collaborate with multidisciplinary team to address chronic and comorbid conditions and prevent exacerbation or deterioration   Update acute care plan with appropriate goals if chronic or comorbid symptoms are exacerbated and prevent overall improvement and discharge     Problem: Discharge Planning  Goal: Discharge to home or other facility with appropriate resources  05/23/2023 0804 by Raul Del, RN  Outcome: Progressing  05/22/2023 2232 by Freddy Finner, RN  Outcome: Progressing  Flowsheets (Taken 05/22/2023 2145)  Discharge to home or other facility with appropriate resources:   Identify barriers to discharge with patient and caregiver   Arrange for needed discharge resources and transportation as appropriate   Identify discharge learning needs (meds, wound care, etc)   Refer to discharge planning if patient needs post-hospital services based on physician order or complex needs related to functional status, cognitive ability or social support system     Problem: Pain  Goal: Verbalizes/displays adequate comfort level or baseline comfort level  05/23/2023 0804 by Raul Del, RN  Outcome: Progressing  05/22/2023 2232 by Freddy Finner, RN  Outcome: Progressing     Problem: Safety - Adult  Goal: Free from fall injury  05/23/2023 0804 by Raul Del, RN  Outcome: Progressing  05/22/2023 2232 by Freddy Finner, RN  Outcome: Progressing  Flowsheets  (Taken 05/22/2023 2145)  Free From Fall Injury:   Instruct family/caregiver on patient safety   Based on caregiver fall risk screen, instruct family/caregiver to ask for assistance with transferring infant if caregiver noted to have fall risk factors     Problem: ABCDS Injury Assessment  Goal: Absence of physical injury  05/23/2023 0804 by Raul Del, RN  Outcome: Progressing  05/22/2023 2232 by Freddy Finner, RN  Outcome: Progressing  Flowsheets (Taken 05/22/2023 2145)  Absence of Physical Injury: Implement safety measures based on patient assessment

## 2023-05-23 NOTE — Progress Notes (Signed)
 [facility] PROGRESS NOTE           05/23/2023 6:43 AM    Admit Date: 05/21/2023    Admit Diagnosis: Atrial fibrillation with rapid ventricular response (HCC) [I48.91]  Atrial fibrillation with RVR (HCC) [I48.91]  Chest pain, unspecified type [R07.9]  Congestive heart failure, unspecified HF chronicity, unspecified heart failure type (HCC) [I50.9]      Subjective:   No complaints this AM, no chest pain or shortness of breath    Interval History: (History of pertinent interval events obtained from nursing staff)  S/p cardiac ablation yesterday without immediate complications    ROS:  GEN:  No fever or chills  Cardiovascular:  As noted above  Pulmonary:  As noted above  Neuro:  No new focal motor or sensory loss      Objective:     Vitals:    05/23/23 0210 05/23/23 0400 05/23/23 0411 05/23/23 0618   BP:   110/64    Pulse: 75 80 86 74   Resp:   18    Temp:   97.5 F (36.4 C)    TempSrc:   Oral    SpO2:   92%    Weight:   129.9 kg (286 lb 4.8 oz)    Height:           Physical Exam:  General-Well Developed, Well Nourished, No Acute Distress, Alert & Oriented x 3, appropriate mood.  Neck- supple, no JVD  CV- regular rate and rhythm no MRG  Lung- clear bilaterally  Abd- soft, nontender, nondistended  Ext- no edema bilaterally, B/L femoral access sites without hematoma or bruits  Skin- warm and dry    Current Facility-Administered Medications   Medication Dose Route Frequency    sodium chloride flush 0.9 % injection 5-40 mL  5-40 mL IntraVENous 2 times per day    sodium chloride flush 0.9 % injection 5-40 mL  5-40 mL IntraVENous PRN    0.9 % sodium chloride infusion   IntraVENous PRN    allopurinol (ZYLOPRIM) tablet 300 mg  300 mg Oral Daily    zolpidem (AMBIEN) tablet 5 mg  5 mg Oral Nightly PRN    cholecalciferol (VITAMIN D3) tablet TABS 800 Units  800 Units Oral Daily    metoprolol succinate (TOPROL XL) extended release tablet 50 mg  50 mg Oral BID    lisinopril (PRINIVIL;ZESTRIL) tablet 40  mg  40 mg Oral Daily    gabapentin (NEURONTIN) capsule 600 mg  600 mg Oral TID    furosemide (LASIX) tablet 40 mg  40 mg Oral Daily PRN    cetirizine (ZYRTEC) tablet 10 mg  10 mg Oral Daily    sodium chloride flush 0.9 % injection 5-40 mL  5-40 mL IntraVENous 2 times per day    sodium chloride flush 0.9 % injection 5-40 mL  5-40 mL IntraVENous PRN    0.9 % sodium chloride infusion   IntraVENous PRN    ondansetron (ZOFRAN-ODT) disintegrating tablet 4 mg  4 mg Oral Q8H PRN    Or    ondansetron (ZOFRAN) injection 4 mg  4 mg IntraVENous Q6H PRN    polyethylene glycol (GLYCOLAX) packet 17 g  17 g Oral Daily PRN    acetaminophen (TYLENOL) tablet 650 mg  650 mg Oral Q6H PRN    Or    acetaminophen (TYLENOL) suppository 650 mg  650 mg Rectal Q6H PRN    vitamin B-6 (PYRIDOXINE) tablet 25 mg  25 mg Oral Daily  rivaroxaban (XARELTO) tablet 20 mg  20 mg Oral Dinner    doxycycline hyclate (VIBRAMYCIN) capsule 100 mg  100 mg Oral BID     Data Review:   Recent Results (from the past 24 hour(s))   TEE (PRN contrast/bubble/3D)    Collection Time: 05/22/23  1:32 PM   Result Value Ref Range    Body Surface Area 2.57 m2   POCT activated clotting time    Collection Time: 05/22/23  2:15 PM   Result Value Ref Range    Activated Clotting Time 360 (H) 70 - 128 SECS   POCT activated clotting time    Collection Time: 05/22/23  2:50 PM   Result Value Ref Range    Activated Clotting Time 308 (H) 70 - 128 SECS   EKG 12 lead    Collection Time: 05/22/23  5:26 PM   Result Value Ref Range    Ventricular Rate 81 BPM    Atrial Rate 81 BPM    P-R Interval 190 ms    QRS Duration 84 ms    Q-T Interval 422 ms    QTc Calculation (Bazett) 490 ms    P Axis 90 degrees    R Axis -5 degrees    T Axis 79 degrees    Diagnosis       Sinus rhythm with Premature atrial complexes  Vent. rate has decreased BY  51 BPM  Questionable change in initial forces of Anterior leads  Confirmed by Cruz Condon ([id number]) on 05/23/2023 5:03:32 AM         EKG:  (EKG has been  independently visualized by me with interpretation below)  Assessment:     Principal Problem:    Atrial fibrillation with rapid ventricular response (HCC)  Resolved Problems:    * No resolved hospital problems. *    Plan:   afib:  Pt underwent cardiac ablation for afib yesterday without immediate complication, no questions or concerns this AM, ready and stable for discharge with appropriate follow up.     [place] Marialy Urbanczyk MD  Cardiology/Electrophysiology

## 2023-05-23 NOTE — Progress Notes (Signed)
 Physician Progress Note      PATIENT:               RAVIS, HERNE  CSN #:                  [id number]  DOB:                       1955/04/22  ADMIT DATE:       05/21/2023 10:49 AM  DISCH DATE:  RESPONDING  PROVIDER #:        Milana Huntsman NP          QUERY TEXT:      Patient admitted with atrial fibrillation, and is treated with Xarelto.    If possible, please document in progress notes and discharge summary if you   are evaluating and/or treating any of the following:?  ?  The medical record reflects the following:  Risk Factors: 68 y/o male, CHF, HTN  Clinical Indicators: Per Cardiology consult: Atrial fibrillation, mildly   persistent, CHADS2VASc = 4  Treatment: Xarelto  Options provided:  -- Secondary hypercoagulable state in a patient with atrial fibrillation  -- Other - I will add my own diagnosis  -- Disagree - Not applicable / Not valid  -- Disagree - Clinically unable to determine / Unknown  -- Refer to Clinical Documentation Reviewer    PROVIDER RESPONSE TEXT:    This patient has secondary hypercoagulable state in a patient with atrial   fibrillation.    Query created by: Osborne Casco on 05/23/2023 10:52 AM      Electronically signed by:  Milana Huntsman NP 05/23/2023 11:09 AM

## 2023-05-23 NOTE — Discharge Summary (Signed)
 [facility] Discharge Summary     Patient ID:  Chad Rosario  [id number]  68 y.o.  01/24/1956    Admit date: 05/21/2023    Discharge date:  05/23/2023    Admitting Physician: Hendricks Limes, MD     Discharge Physician: Dr. Sallyanne Kuster    Admission Diagnoses: Atrial fibrillation with rapid ventricular response (HCC) [I48.91]  Atrial fibrillation with RVR (HCC) [I48.91]  Chest pain, unspecified type [R07.9]  Congestive heart failure, unspecified HF chronicity, unspecified heart failure type [facility]) [I50.9]    Discharge Diagnoses:   Patient Active Problem List    Diagnosis    Atrial fibrillation with rapid ventricular response (HCC)    Persistent atrial fibrillation [facility])       Cardiology Procedures this admission:  TEE, AFIB ablation  Consults: Electrophysiology    Hospital Course: Patient presented to the ER with complaints of chest tightness and shortness of breath. Patient was found to be in atrial fibrillation with RVR. Patient was admitted for continued cardiac care.     Electrophysiology was consulted and ablation was recommended. Patient was taken to the EP lab by Dr. Devona Konig. Patient underwent TEE followed by AFIB ablation. Patient tolerated the procedure well and was taken to telemetry for recovery. The morning of discharge, patient was up feeling well without any complaints of chest pain or shortness of breath. Patient's bilateral cath sites were clean, dry and intact without hematoma or bruit. Patient's labs were WNL. Patient was seen and examined by Dr. Sallyanne Kuster and determined stable and ready for discharge. Patient was instructed on the importance of medication compliance including Xarelto everyday without missing a dose. No PPI, Carafate due to PFA technique used. The patient will follow up with [facility] -- Dr. Devona Konig in 4-6 weeks    DISPOSITION: The patient is being discharged home in stable condition on a low saturated fat, low cholesterol and low salt  diet. The patient is instructed to advance activities as tolerated to the limit of fatigue or shortness of breath. The patient is instructed to avoid all heavy lifting, straining, stooping or squatting for 3-5 days. The patient is instructed to watch the cath site for bleeding/oozing; if seen, the patient is instructed to apply firm pressure with a clean cloth and call [facility] at [phone]. The patient is instructed to watch for signs of infection which include: increasing area of redness, fever/hot to touch or purulent drainage at the catheterization site. The patient is instructed not to soak in a bathtub for 7-10 days, but is cleared to shower.       Discharge Exam:Patient has been seen by Dr. Sallyanne Kuster: see his progress note for exam details.   BP 110/64   Pulse 86   Temp 97.5 F (36.4 C) (Oral)   Resp 18   Ht 1.854 m (6\' 1" )   Wt 129.9 kg (286 lb 4.8 oz)   SpO2 92%   BMI 37.77 kg/m       Recent Results (from the past 24 hour(s))   TEE (PRN contrast/bubble/3D)    Collection Time: 05/22/23  1:32 PM   Result Value Ref Range    Body Surface Area 2.57 m2   POCT activated clotting time    Collection Time: 05/22/23  2:15 PM   Result Value Ref Range    Activated Clotting Time 360 (H) 70 - 128 SECS   POCT activated clotting time    Collection Time: 05/22/23  2:50 PM   Result Value Ref Range  Activated Clotting Time 308 (H) 70 - 128 SECS   EKG 12 lead    Collection Time: 05/22/23  5:26 PM   Result Value Ref Range    Ventricular Rate 81 BPM    Atrial Rate 81 BPM    P-R Interval 190 ms    QRS Duration 84 ms    Q-T Interval 422 ms    QTc Calculation (Bazett) 490 ms    P Axis 90 degrees    R Axis -5 degrees    T Axis 79 degrees    Diagnosis       Sinus rhythm with Premature atrial complexes  Vent. rate has decreased BY  51 BPM  Questionable change in initial forces of Anterior leads  Confirmed by Cruz Condon ([id number]) on 05/23/2023 5:03:32 AM           Patient Instructions:          Medication List         CHANGE how you take these medications      gabapentin 600 MG tablet  Commonly known as: Neurontin  Take 1 tablet by mouth in the morning, at noon, in the evening, and at bedtime.  What changed: additional instructions            CONTINUE taking these medications      allopurinol 300 MG tablet  Commonly known as: ZYLOPRIM  TAKE 1 TABLET BY MOUTH DAILY     cholecalciferol 400 UNIT Tabs tablet  Commonly known as: VITAMIN D3     colchicine 0.6 MG capsule  Commonly known as: MITIGARE  Take 1 capsule by mouth as needed for Pain     CPAP Machine Misc     doxycycline hyclate 100 MG capsule  Commonly known as: VIBRAMYCIN     fexofenadine 180 MG tablet  Commonly known as: ALLEGRA     furosemide 40 MG tablet  Commonly known as: LASIX  Take 1 tablet by mouth daily as needed (take one tablet daily for weight garin greater than 2 lbs in 24 hours or 4 lbs in 48 hours.)     lisinopril 20 MG tablet  Commonly known as: PRINIVIL;ZESTRIL  Take 2 tablets by mouth daily     metoprolol succinate 50 MG extended release tablet  Commonly known as: TOPROL XL  Take 1 tablet by mouth 2 times daily     PYRIDOXINE HCL PO     rivaroxaban 20 MG Tabs tablet  Commonly known as: XARELTO  Take 1 tablet by mouth daily (with breakfast)     therapeutic multivitamin-minerals tablet     VITAMIN B 12 PO     zolpidem 5 MG tablet  Commonly known as: AMBIEN  Take 1 tablet by mouth nightly as needed for Sleep for up to 90 days. Max Daily Amount: 5 mg            ASK your doctor about these medications      Alpha-Lipoic Acid 200 MG Caps

## 2023-05-23 NOTE — Care Coordination-Inpatient (Signed)
 Discharge order is in. Pt presented to the ED c/o chest tightness and SOB. Pt was found to be in AFib RVR. on with RVR. Pt was admitted for continued cardiac care. EP was consulted and ablation was recommended. Pt was taken to the EP lab by Dr. Devona Konig and underwent TEE followed by AFib ablation. Pt tolerated the procedure well and is now discharging home today in stable condition. No discharge needs identified by CM. Tx goals met.     05/23/23 0908   Services At/After Discharge   Transition of Care Consult (CM Consult) Discharge Planning   Services At/After Discharge None   Veteran Resource Information Provided? Yes   Mode of Transport at Discharge Other (see comment)  (Family)   Confirm Follow Up Transport Family   Condition of Participation: Discharge Planning   The Patient and/or Patient Representative was provided with a Choice of Provider? Patient   The Patient and/Or Patient Representative agree with the Discharge Plan? Yes   Freedom of Choice list was provided with basic dialogue that supports the patient's individualized plan of care/goals, treatment preferences, and shares the quality data associated with the providers?  Yes

## 2023-05-26 ENCOUNTER — Telehealth

## 2023-05-26 NOTE — Telephone Encounter (Signed)
 Pt would like to an order for travel cpap . He has an appt in July with lacey .

## 2023-05-26 NOTE — Care Coordination-Inpatient (Signed)
 Care Transitions Note    Initial Call - Call within 2 business days of discharge: Yes    Patient Current Location:  Home: [address] [place] [facility] [address]    Care Transition Nurse contacted the patient by telephone to perform post hospital discharge assessment, verified name and DOB as identifiers. Provided introduction to self, and explanation of the Care Transition Nurse role.     Patient: Chad Rosario [facility]    Patient DOB: 1956/03/10   MRN: [id number]    Reason for Admission: Congestive heart failure, unspecified HF chronicity, unspecified heart failure type (  Discharge Date: 05/23/23  RURS: Readmission Risk Score: 14.9      Last Discharge Facility       Date Complaint Diagnosis Description Type Department Provider    05/21/23 Chest Pain; Shortness of Breath Congestive heart failure, unspecified HF chronicity, unspecified heart failure type (HCC) ... ED to Hosp-Admission (Discharged) (ADMITTED) [id number] Hendricks Limes, MD; [place], ...            Was this an external facility discharge? No    Additional needs identified to be addressed with provider   No needs identified             Method of communication with provider: none.    Patients top risk factors for readmission: medical condition-Atrial fibrillation with rapid ventricular response (HCC) [I48.91]  Atrial fibrillation with RVR (HCC) [I48.91]  Chest pain, unspecified type [R07.9]  Congestive heart failure, unspecified HF chronicity, unspecified heart failure type   Interventions to address risk factors:   Review of patient management of conditions/medications: verbalized understanding.    Care Summary Note: patient states they are doing ok. Has no concerns or questions at this time.    Care Transition Nurse reviewed discharge instructions with patient. The patient was given an opportunity to ask questions; all questions answered at this time.. The patient verbalized understanding.   Were discharge instructions available to patient? Yes.   Reviewed  appropriate site of care based on symptoms and resources available to patient including: PCP  Specialist  Urgent care clinics  When to call 911. The patient agrees to contact the primary care provider and/or specialist office for questions related to their healthcare.      Advance Care Planning:   Does patient have an Advance Directive:   Primary Decision Maker: [name], [name] - Spouse - [phone]    Secondary Decision Maker: [name] - Child [phone] .    Medication Reconciliation:  Medication reconciliation was performed with patient,1111F entered: yes.     Remote Patient Monitoring:  Offered patient enrollment in the Remote Patient Monitoring (RPM) program for in-home monitoring: Yes, but did not enroll at this time: declined to enroll in the program becauseRPM .    Assessments:  Care Transitions 24 Hour Call    Do you have a copy of your discharge instructions?: Yes  Do you have all of your prescriptions and are they filled?: Yes  Have you been contacted by a [facility] Pharmacist?: No  Have you scheduled your follow up appointment?: Yes  How are you going to get to your appointment?: Car - family or friend to transport  Do you feel like you have everything you need to keep you well at home?: Yes  Care Transitions Interventions          Follow Up Appointment:   Patient does not have a follow up appointment scheduled at time of call. Prefers to self-schedule TOC appointment. CTN sent a  message to PCP office about patient's recent hospitalization and status.  Future Appointments         Provider Specialty Dept Phone    05/27/2023 8:50 AM Marcene Duos, APRN - NP; Fanny Skates, RN Wound Ostomy [phone]    06/06/2023 12:30 PM Cynda Familia, MD Gastroenterology [phone]    06/16/2023 10:45 AM Rhett Bannister, MD Cardiology [phone]    07/04/2023 12:00 PM Chesley Mires, MD Neurology [phone]    08/08/2023 9:15 AM [name], MD Family Medicine [phone]     08/12/2023 8:45 AM [name], MD Family Medicine [phone]    08/19/2023 9:30 AM [name], MD Cardiology [phone]    09/29/2023 8:20 AM Lance Sell, APRN - CNP Sleep Medicine [phone]            Care Transition Nurse provided contact information.  Plan for follow-up call in 6-10 days based on severity of symptoms and risk factors.  Plan for next call: self management-assess for self management of chronic disease including: follow up with providers, medication compliance, call providers as needed. Patient states that he will continue to follow up with his current plan of care and providers.     Cheryle Horsfall, RN

## 2023-05-27 ENCOUNTER — Inpatient Hospital Stay: Admit: 2023-05-27 | Discharge: 2023-05-27 | Payer: MEDICARE | Primary: Family Medicine

## 2023-05-27 VITALS — BP 150/80 | HR 62 | Temp 98.20000°F | Resp 16 | Ht 73.0 in | Wt 289.0 lb

## 2023-05-27 DIAGNOSIS — T8131XD Disruption of external operation (surgical) wound, not elsewhere classified, subsequent encounter: Secondary | ICD-10-CM

## 2023-05-27 DIAGNOSIS — S71101A Unspecified open wound, right thigh, initial encounter: Secondary | ICD-10-CM

## 2023-05-27 MED ORDER — SILVER NITRATE-POT NITRATE 75-25 % EX MISC
75-25 | CUTANEOUS | Status: DC | PRN
Start: 2023-05-27 — End: 2023-05-28
  Administered 2023-05-27: 17:00:00 1 via TOPICAL

## 2023-05-27 MED ORDER — LIDOCAINE HCL URETHRAL/MUCOSAL 2 % EX GEL
2 | CUTANEOUS | Status: DC | PRN
Start: 2023-05-27 — End: 2023-05-28
  Administered 2023-05-27: 17:00:00 via TOPICAL

## 2023-05-27 NOTE — Discharge Instructions (Signed)
 Return Appointment:   1 week with Chad Pollen, NP        Instructions: Right lower leg:  Cleanse with Vashe (may purchase at [facility]) let sit on wound for at least one minute  At home, apply Medihoney (may use [facility] brand, if desired); obtain over-the-counter and bring to next visit.  Apply oil emulsion dressing  Cover with Hydrofera Ready: Cut to wound size, place in wound bed, shiny side out.    Cover with cover dressing  Change 3 times per week     Wear compression stockings daily; may remove at night (may wear tubigrip at night).  Elevate legs while sitting  Eat minimum of 100 grams of protein per day  Protein:   Egg ~ 6g  Yogurt ~ 15g  Half cup of cottage cheese ~ 15g  Chicken breast ~ 30g  Salmon filet ~ 40g      Silver nitrate applied today post-debridement

## 2023-05-27 NOTE — Progress Notes (Signed)
 [facility]  Procedure Note    Chad Rosario [facility]  MEDICAL RECORD NUMBER: [id number]  AGE: 68 y.o.     GENDER: male    DOB: 14-Sep-1955  EPISODE DATE: 05/27/2023    Problem List Items Addressed This Visit          Other    * (Principal) Wound dehiscence, surgical - Primary (Chronic)     Procedure Note: Excisional Debridement  Indications: Based on my examination of the patient's wound(s) today, debridement is required to remove devitalized tissue, evaluate the wound base, and promote wound healing.  Performed by: Marcene Duos, APRN - NP  Consent obtained: Yes  Time out taken: Yes  Pain control:     Wound #: 1  Diabetic/pressure/chronic non-pressure ulcers only: non-pressure ulcer, fat layer exposed was/were debrided using curette. The wound(s) was/were debrided down through/to subcutaneous tissue. Devitalized tissue debrided: biofilm, slough, and exudate. Pre and post debridement measurements: see flow sheet.    Puncture 05/22/23 Femoral (Active)   Wound Assessment Other (Comment) 05/23/23 0400   Closure Sutures;Other (Comment) 05/23/23 0756   Drainage Amount None 05/23/23 0756   Dressing/Treatment Gauze dressing/dressing sponge;Tegaderm/transparent film dressing 05/23/23 0756   Dressing Status Old drainage noted 05/23/23 0756   Number of days: 10       Puncture 05/22/23 Femoral (Active)   Wound Assessment Other (Comment) 05/23/23 0400   Closure Other (Comment) 05/23/23 0756   Drainage Amount None 05/23/23 0756   Dressing/Treatment Gauze dressing/dressing sponge;Tegaderm/transparent film dressing 05/23/23 0756   Dressing Status Clean, dry & intact 05/23/23 0756   Number of days: 10       Wound 06/07/21 Knee Right;Posterior (Active)   Number of days: 724       Wound 05/20/23 Pretibial Right;Lower #1 (Active)   Wound Image   05/27/23 0849   Wound Etiology Other 05/27/23 0849   Dressing Status Old drainage noted 05/27/23 0849   Wound Cleansed Cleansed with saline 05/27/23  0849   Dressing/Treatment Gauze dressing/dressing sponge;Adhesive bandage 05/27/23 0849   Wound Length (cm) 3.3 cm 05/27/23 0849   Wound Width (cm) 2.7 cm 05/27/23 0849   Wound Depth (cm) 0.3 cm 05/27/23 0849   Wound Surface Area (cm^2) 8.91 cm^2 05/27/23 0849   Change in Wound Size % (l*w) -14.23 05/27/23 0849   Wound Volume (cm^3) 2.673 cm^3 05/27/23 0849   Wound Healing % -71 05/27/23 0849   Post-Procedure Length (cm) 3.3 cm 05/27/23 0849   Post-Procedure Width (cm) 2.7 cm 05/27/23 0849   Post-Procedure Depth (cm) 0.3 cm 05/27/23 0849   Post-Procedure Surface Area (cm^2) 8.91 cm^2 05/27/23 0849   Post-Procedure Volume (cm^3) 2.673 cm^3 05/27/23 0849   Wound Assessment Pink/red;Slough 05/27/23 0849   Drainage Amount Moderate (25-50%) 05/27/23 0849   Drainage Description Serosanguinous 05/27/23 0849   Odor None 05/27/23 0849   Peri-wound Assessment Maceration 05/27/23 0849   Wound Thickness Description not for Pressure Injury Full thickness 05/27/23 0849   Number of days: 12         Total surface area debrided: 8.9 sq cm   Estimated blood loss: minimal amount blood loss  Hemostasis achieved: pressure  Procedural pain: 0 / 10   Post procedural pain: 0 / 10   Response to treatment: tolerated procedure well with no complaints of pain    This dictation may have been generated in part by voice recognition computer software. Although all attempts are made to edit the dictation for  accuracy, there may be errors in the transcription that are not intended.

## 2023-05-27 NOTE — Other (Signed)
 05/27/23 0849   Right Leg Edema Point of Measurement   Leg circumference 45 cm   Ankle circumference 26 cm   Foot circumference 26.5 cm   Compression Therapy Compression stockings   Wound 05/20/23 Pretibial Right;Lower #1   Date First Assessed/Time First Assessed: 05/20/23 1412   Present on Original Admission: Yes  Wound Approximate Age at First Assessment (Weeks): 2 weeks  Primary Wound Type: (c) Other (comment)  Location: Pretibial  Wound Location Orientation: Right;Lo...   Wound Image    Wound Etiology Other  (s/p Mohs procedure)   Dressing Status Old drainage noted   Wound Cleansed Cleansed with saline   Dressing/Treatment Gauze dressing/dressing sponge;Adhesive bandage   Wound Length (cm) 3.3 cm   Wound Width (cm) 2.7 cm   Wound Depth (cm) 0.3 cm   Wound Surface Area (cm^2) 8.91 cm^2   Change in Wound Size % (l*w) -14.23   Wound Volume (cm^3) 2.673 cm^3   Wound Healing % -71   Post-Procedure Length (cm) 3.3 cm   Post-Procedure Width (cm) 2.7 cm   Post-Procedure Depth (cm) 0.3 cm   Post-Procedure Surface Area (cm^2) 8.91 cm^2   Post-Procedure Volume (cm^3) 2.673 cm^3   Wound Assessment Pink/red;Slough   Drainage Amount Moderate (25-50%)   Drainage Description Serosanguinous   Odor None   Peri-wound Assessment Maceration   Wound Thickness Description not for Pressure Injury Full thickness   Pain Assessment   Pain Assessment None - Denies Pain     Patient is currently taking Xarelto. Pre- and post-debridement.

## 2023-05-27 NOTE — Telephone Encounter (Signed)
 I called patient to follow-up with him on his venous and arterial ultrasound results.  There is no evidence of any significant peripheral arterial disease in his lower extremities.  His venous ultrasound does not show any acute DVT in the left leg and chronic changes we were aware of in his right femoral and popliteal veins.  I explained to him that we still think most of his discomfort is more related to his severe peripheral neuropathy as he has had prior back surgeries.  He is still taking Neurontin but I recommended he follow-up with neurology since his discomfort is significant.

## 2023-05-27 NOTE — Wound Image (Signed)
 Discharge Instructions for  [facility]  [address]  [address]  [place], [place] [address]  Phone [phone]   Fax [phone]      NAME:  Chad Rosario  DATE OF BIRTH:  Aug 16, 1955  MEDICAL RECORD NUMBER:  [id number]  DATE:  05/27/2023    Return Appointment:   1 week with Orlie Pollen, NP      Instructions: Right lower leg:  Cleanse with Vashe (may purchase at [facility]) let sit on wound for at least one minute  At home, apply Medihoney (may use [employer] brand, if desired); obtain over-the-counter and bring to next visit.  Apply oil emulsion dressing  Cover with Hydrofera Ready: Cut to wound size, place in wound bed, shiny side out.    Cover with cover dressing  Change 3 times per week    Wear compression stockings daily; may remove at night (may wear tubigrip at night).  Elevate legs while sitting  Eat minimum of 100 grams of protein per day  Protein:   Egg ~ 6g  Yogurt ~ 15g  Half cup of cottage cheese ~ 15g  Chicken breast ~ 30g  Salmon filet ~ 40g     Silver nitrate applied today post-debridement      Should you experience increased redness, swelling, pain, foul odor, size of wound(s), or have a temperature over 101 degrees please contact the Wound Healing Center at [phone] or if after hours contact your primary care physician or go to the hospital emergency department.    PLEASE NOTE: IF YOU ARE UNABLE TO OBTAIN WOUND SUPPLIES, CONTINUE TO USE THE SUPPLIES YOU HAVE AVAILABLE UNTIL YOU ARE ABLE TO REACH [place]. IT IS MOST IMPORTANT TO KEEP THE WOUND COVERED AT ALL TIMES.    Electronically signed Bobetta Lime, PT, [facility] on 05/27/2023 at 9:26 AM

## 2023-05-29 ENCOUNTER — Ambulatory Visit: Admit: 2023-05-29 | Discharge: 2023-05-29 | Payer: MEDICARE | Attending: Physician Assistant | Primary: Family Medicine

## 2023-05-29 DIAGNOSIS — Z09 Encounter for follow-up examination after completed treatment for conditions other than malignant neoplasm: Secondary | ICD-10-CM

## 2023-05-29 NOTE — Progress Notes (Signed)
 Post-Discharge Transitional Care Follow Up      Chad Rosario [facility]   Date of Birth:  10-14-1955    Date of Office Visit:  05/29/2023  Date of Hospital Admission: 05/21/23  Date of Hospital Discharge: 05/23/23  Readmission Risk Score (high >=14%. Medium >=10%):Readmission Risk Score: 14.9      Care management risk score Rising risk (score 2-5) and Complex Care (Scores >=6): No Risk Score On File     Non face to face  following discharge, date last encounter closed (first attempt may have been earlier): 05/26/2023     Call initiated 2 business days of discharge: Yes     Hospital discharge follow-up  -     PR DISCHARGE MEDS RECONCILED W/ CURRENT OUTPATIENT MED LIST    Medical Decision Making: moderate complexity  Return in 3 months (on 08/29/2023).           Subjective:   [name] was admitted 05/21/23 for afib with RVR, underwent ablation with successful rate control and d/c 05/23/23. He is here for postdischarge fu. He is feeling good. He is taking all medications as prescribed. HR has been running in the 50's. He denies dizziness, weakness, SOB, leg swelling, chest pain, fever, or urinary complaints.         Inpatient course: Discharge summary reviewed- see chart.    Interval history/Current status: stable, much improved     Patient Active Problem List   Diagnosis    Obesity (BMI 30-39.9)    History of DVT (deep vein thrombosis)    Hx of CABG    Hypertension    Hyperlipidemia    DOE (dyspnea on exertion)    Thrombocytopenia    Neuropathic pain    Paresthesia    Bilateral leg weakness    Foot drop, bilateral    Cramps of lower extremity    Acute medial meniscal tear    Allergic rhinitis    Ataxia    Calcium oxalate renal stones    Chronic pain disorder    Coronary artery disease involving native coronary artery of native heart without angina pectoris    Deep vein thrombosis (DVT) of femoral vein of right lower extremity (HCC)    Degeneration of intervertebral disc of lumbar region    Colon adenoma    Dyslipidemia     GERD without esophagitis    Gout of both feet    Idiopathic chronic gout of multiple sites without tophus    H/O seasonal allergies    Idiopathic gout    Idiopathic peripheral neuropathy    IFG (impaired fasting glucose)    Insomnia    Low back pain    Lumbar facet joint pain    Lumbar postlaminectomy syndrome    Lumbar radiculopathy, chronic    Lumbar stenosis    Obstructive sleep apnea syndrome    Primary osteoarthritis of left hip    S/P angioplasty with stent    Unsteadiness on feet    Volume overload    Bilateral lower extremity edema    Atrial fibrillation with RVR (HCC)    Congestive heart failure (HCC)    Persistent atrial fibrillation (HCC)    Chronic heart failure with preserved ejection fraction (HFpEF) (HCC)    Wound dehiscence, surgical    Lower extremity edema    S/P ablation of atrial fibrillation    S/P ablation of atrial flutter       Medications listed as ordered at the time of discharge from hospital  Medication List            Accurate as of May 29, 2023 11:59 PM. If you have any questions, ask your nurse or doctor.                CHANGE how you take these medications      gabapentin 600 MG tablet  Commonly known as: Neurontin  Take 1 tablet by mouth in the morning, at noon, in the evening, and at bedtime.  What changed: additional instructions            CONTINUE taking these medications      allopurinol 300 MG tablet  Commonly known as: ZYLOPRIM  TAKE 1 TABLET BY MOUTH DAILY     Alpha-Lipoic Acid 200 MG Caps     cholecalciferol 400 UNIT Tabs tablet  Commonly known as: VITAMIN D3     colchicine 0.6 MG capsule  Commonly known as: MITIGARE  Take 1 capsule by mouth as needed for Pain     CPAP Machine Misc     doxycycline hyclate 100 MG capsule  Commonly known as: VIBRAMYCIN     fexofenadine 180 MG tablet  Commonly known as: ALLEGRA     furosemide 40 MG tablet  Commonly known as: LASIX  Take 1 tablet by mouth daily as needed (take one tablet daily for weight garin greater than 2 lbs in 24 hours  or 4 lbs in 48 hours.)     lisinopril 20 MG tablet  Commonly known as: PRINIVIL;ZESTRIL  Take 2 tablets by mouth daily     metoprolol succinate 50 MG extended release tablet  Commonly known as: TOPROL XL  Take 1 tablet by mouth 2 times daily     PYRIDOXINE HCL PO     rivaroxaban 20 MG Tabs tablet  Commonly known as: XARELTO  Take 1 tablet by mouth daily (with breakfast)     therapeutic multivitamin-minerals tablet     VITAMIN B 12 PO     zolpidem 5 MG tablet  Commonly known as: AMBIEN  Take 1 tablet by mouth nightly as needed for Sleep for up to 90 days. Max Daily Amount: 5 mg               Medications marked "taking" at this time  Outpatient Medications Marked as Taking for the 05/29/23 encounter (Office Visit) with Tish Frederickson, PA   Medication Sig Dispense Refill    zolpidem (AMBIEN) 5 MG tablet Take 1 tablet by mouth nightly as needed for Sleep for up to 90 days. Max Daily Amount: 5 mg 30 tablet 2    lisinopril (PRINIVIL;ZESTRIL) 20 MG tablet Take 2 tablets by mouth daily 180 tablet 1    rivaroxaban (XARELTO) 20 MG TABS tablet Take 1 tablet by mouth daily (with breakfast) 30 tablet 11    metoprolol succinate (TOPROL XL) 50 MG extended release tablet Take 1 tablet by mouth 2 times daily 30 tablet 3    doxycycline hyclate (VIBRAMYCIN) 100 MG capsule Take 1 capsule by mouth 2 times daily      Multiple Vitamins-Minerals (THERAPEUTIC MULTIVITAMIN-MINERALS) tablet Take 1 tablet by mouth daily      vitamin D3 (CHOLECALCIFEROL) 10 MCG (400 UNIT) TABS tablet Take 2 tablets by mouth daily      fexofenadine (ALLEGRA) 180 MG tablet Take 1 tablet by mouth daily          Medications patient taking as of now reconciled against medications ordered at time of hospital discharge: Yes  Review of Systems    Objective:    BP 118/72 (Site: Left Upper Arm, Position: Sitting, Cuff Size: Large Adult)   Pulse 56   Temp 98.7 F (37.1 C) (Skin)   Ht 1.854 m (6\' 1" )   Wt 128.4 kg (283 lb)   SpO2 97%   BMI 37.34 kg/m   Physical  Exam  Vitals and nursing note reviewed.   Constitutional:       General: He is not in acute distress.     Appearance: Normal appearance.   HENT:      Head: Normocephalic and atraumatic.      Nose: Nose normal.   Eyes:      Conjunctiva/sclera: Conjunctivae normal.   Cardiovascular:      Rate and Rhythm: Normal rate and regular rhythm.      Heart sounds: No murmur heard.  Pulmonary:      Effort: Pulmonary effort is normal.      Breath sounds: No wheezing.   Skin:     General: Skin is warm and dry.      Capillary Refill: Capillary refill takes less than 2 seconds.   Neurological:      General: No focal deficit present.      Mental Status: He is alert and oriented to person, place, and time. Mental status is at baseline.   Psychiatric:         Mood and Affect: Mood normal.         Thought Content: Thought content normal.         Judgment: Judgment normal.       Plan:   Continue medication as prescribed at discharge. Monitor HR and fu with cardiology as scheduled.     An electronic signature was used to authenticate this note.  --Carmen Vallecillo ANN Rosy Estabrook, PA

## 2023-06-02 NOTE — Care Coordination-Inpatient (Signed)
 Care Transitions Note    Follow Up Call     Patient Current Location:  Home: [address] [place] [facility] [address]    LPN Care Coordinator contacted the patient by telephone. Verified name and DOB as identifiers.    Additional needs identified to be addressed with provider   No needs identified                 Method of communication with provider: none.    Care Summary Note: States he is doing well.  Reports good appetite and hydration.  Has attended scheduled follow up appointments.  Has follow up appointment at wound care  06/03/2023 which he states he will be attending.  Denies complaints of pain, shortness of breath, palpitations.  Denies falls.  No needs at this time.  Has contact information if needed, Jyl Heinz LPN [facility] [phone].    Plan of care updates since last contact:  Education: Jyl Heinz LPN [facility] [phone].  All scheduled appointments.        Advance Care Planning:   Does patient have an Advance Directive: Not on file; patient encouraged to bring existing ACP documents to a [facility] facility..    Medication Review:  No changes since last call.     Remote Patient Monitoring:  Offered patient enrollment in the Remote Patient Monitoring (RPM) program for in-home monitoring: Yes, but did not enroll at this time: na .    Assessments:  Please see above care summary note.     Follow Up Appointment:   Reviewed upcoming appointment(s).  Future Appointments         Provider Specialty Dept Phone    06/03/2023 8:10 AM Marcene Duos, APRN - NP; Bobetta Lime, PT Wound Ostomy [phone]    06/06/2023 12:30 PM Cynda Familia, MD Gastroenterology [phone]    06/16/2023 10:45 AM Rhett Bannister, MD Cardiology [phone]    07/04/2023 12:00 PM Chesley Mires, MD Neurology [phone]    08/08/2023 9:15 AM [name], MD Family Medicine [phone]    08/12/2023 8:45 AM [name], MD Family Medicine [phone]    08/19/2023 9:30 AM [name], MD Cardiology  [phone]    09/29/2023 8:20 AM Lance Sell, APRN - CNP Sleep Medicine [phone]            LPN Care Coordinator provided contact information.  Plan for follow-up call in 6-10 days based on severity of symptoms and risk factors.  Plan for next call: self management-diet, hydration, exercise, follow up appointments.       Clayton Bibles, LPN

## 2023-06-03 ENCOUNTER — Inpatient Hospital Stay: Admit: 2023-06-03 | Discharge: 2023-06-03 | Payer: MEDICARE | Primary: Family Medicine

## 2023-06-03 VITALS — BP 142/77 | HR 64 | Temp 98.50000°F | Resp 16 | Ht 73.0 in | Wt 290.0 lb

## 2023-06-03 DIAGNOSIS — E11622 Type 2 diabetes mellitus with other skin ulcer: Secondary | ICD-10-CM

## 2023-06-03 MED ORDER — LIDOCAINE HCL URETHRAL/MUCOSAL 2 % EX GEL
2 | CUTANEOUS | Status: DC | PRN
Start: 2023-06-03 — End: 2023-06-04
  Administered 2023-06-03: 13:00:00 via TOPICAL

## 2023-06-03 NOTE — Assessment & Plan Note (Signed)
 Assessment  SP Mohs procedure; surgical site sutures ruptured immediately post op, likely due to swelling  History of swelling; does not wear compression  Wound bed is pink tissue with slough; no localized edema or periwound erythema to suggest infection  Plan  Excisional debridement remove devitalized tissue and promote wound healing  Wound care: Vashe cleanse, medihoney, Hydrofera Blue, change three times weekly; agreeable to try multilayer compression  RTC 1 week for dressing change, 2 weeks for APC visit

## 2023-06-03 NOTE — Other (Signed)
 06/03/23 0815   Right Leg Edema Point of Measurement   Leg circumference 43 cm   Ankle circumference 26 cm   Foot circumference 26 cm   Compression Therapy Compression stockings   Wound 05/20/23 Pretibial Right;Lower #1   Date First Assessed/Time First Assessed: 05/20/23 1412   Present on Original Admission: Yes  Wound Approximate Age at First Assessment (Weeks): 2 weeks  Primary Wound Type: (c) Other (comment)  Location: Pretibial  Wound Location Orientation: Right;Lo...   Wound Image     Wound Etiology Other  (moh's procedure)   Dressing Status Old drainage noted   Wound Cleansed Cleansed with saline   Dressing/Treatment Honey gel/honey paste   Wound Length (cm) 2.5 cm   Wound Width (cm) 2.7 cm   Wound Depth (cm) 0.2 cm   Wound Surface Area (cm^2) 6.75 cm^2   Change in Wound Size % (l*w) 13.46   Wound Volume (cm^3) 1.35 cm^3   Wound Healing % 13   Post-Procedure Length (cm) 2.5 cm   Post-Procedure Width (cm) 2.8 cm   Post-Procedure Depth (cm) 0.2 cm   Post-Procedure Surface Area (cm^2) 7 cm^2   Post-Procedure Volume (cm^3) 1.4 cm^3   Wound Assessment Pink/red;Slough   Drainage Amount Moderate (25-50%)   Drainage Description Serosanguinous   Odor None   Peri-wound Assessment Maceration   Wound Thickness Description not for Pressure Injury Full thickness   Pain Assessment   Pain Assessment None - Denies Pain

## 2023-06-03 NOTE — Wound Image (Signed)
 Discharge Instructions for  [facility]  [address]  [address]  [place], [place] [address]  Phone [phone]   Fax [phone]      NAME:  Chad Rosario  DATE OF BIRTH:  [birth year]  MEDICAL RECORD NUMBER:  [id number]  DATE:  06/03/2023    Return Appointment:   2 weeks with Orlie Pollen, NP  Clinician visit in 1 week      Instructions: Right lower leg:  Cleanse with Vashe (may purchase at [facility]) let sit on wound for at least one minute  At home, apply Medihoney (may use [employer] brand, if desired); obtain over-the-counter and bring to next visit.  Apply oil emulsion dressing  Cover with Hydrofera Ready: Cut to wound size, place in wound bed, shiny side out.    Cover with cover dressing  Change 3 times per week  2 layer compression with wound and lower extremity assessment.  If there is any problem with the dressing (too tight, slides down, etc.) Patient to return to wound clinic to have re-wrapped by clinician.     Elevate legs when sitting.  Avoid prolonged standing or sitting with legs in dependent position.     Elevate legs while sitting  Eat minimum of 100 grams of protein per day  Protein:   Egg ~ 6g  Yogurt ~ 15g  Half cup of cottage cheese ~ 15g  Chicken breast ~ 30g  Salmon filet ~ 40g     Should you experience increased redness, swelling, pain, foul odor, size of wound(s), or have a temperature over 101 degrees please contact the Wound Healing Center at [phone] or if after hours contact your primary care physician or go to the hospital emergency department.    PLEASE NOTE: IF YOU ARE UNABLE TO OBTAIN WOUND SUPPLIES, CONTINUE TO USE THE SUPPLIES YOU HAVE AVAILABLE UNTIL YOU ARE ABLE TO REACH [place]. IT IS MOST IMPORTANT TO KEEP THE WOUND COVERED AT ALL TIMES.    Electronically signed Madelyn Flavors, RN, [facility] on 06/03/2023 at 8:28 AM

## 2023-06-03 NOTE — Progress Notes (Signed)
 -------------------------------------------------------------------------------  Attestation signed by Linward Natal, MD at 06/25/2023  1:42 PM  I have seen and evaluated the patient. Discussed with Dr Don Broach and agree with the findings and plan as documented in the fellow's note.    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 Op cx with S. epidermidis   Thrombocytopenia, chronic: Reported started with initiation of DVT. 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  Will transition from doxycycline to minocycline to decrease sun sensitivity given upcoming travels abroad and he reports noting significant sun sensitivity with doxycycline.  Follow-up after he returns from his travels at the end of May and considered discontinuation of suppressive therapy at that time, which will be more than 3 months of total suppressive therapy following treatment of coagulase-negative staph early onset right TSA PJI with single-stage exchange.  Consider surveillance blood work around 4 weeks after discontinuation of antibiotic therapy.  Consider addition of HIV and HCV screening at that time given chronic thrombocytopenia without screening on record.    Anti-Infectives:     Dalbavancin 03/14/23, 03/21/23  Doxycycline 03/24/23-  Ciprofloxacin 2/17 x7 days ppx for Mohs, per Dermatology    Subjective:     Chad Rosario returns for follow-up today for chronic suppressive antibiotic therapy following early onset right TSA infection with staph epi.  Today he reports that he is doing well and has continued to have improvement in pain and range of motion of the  right shoulder.  The incision is well-healed.  He denies any fevers or chills.  Since last clinic visit he did undergo a Mohs procedure to his right leg for removal of squamous cell carcinoma he received 7 days of ciprofloxacin in mid February prophylactically, per Dermatology, for this procedure.  He was also admitted for atrial fibrillation with rapid ventricular rate and underwent ablation 2/27.  He has been started on a beta-blocker and reports low energy, which he reports started after initiating this medication.  His heart rate is now resting in the low 50s, per his report.  He reports good tolerance of the doxycycline and denies any missed doses of this medication.  He does note some sun sensitivity.  He does report some constipation, which he attributes to the doxycycline.  He has started probiotics.  He reports he is planning a trip to [place] with his wife for/25-5/9.    We reviewed most recent lab work, including normalization of CRP by second dose of dalbavancin 12/27 with repeat 2/5 still normal.  He was noted to have a neutrophil predominant leukocytosis following ablation while recently inpatient and slight worsening of chronic thrombocytopenia.  He is scheduled to see Orthopedics 3/24.    Review of Systems:  As above    Allergies:  Allergies   Allergen Reactions   . Hydrocodone-Acetaminophen Anxiety and Itching   . Losartan Shortness Of Breath   . Amlodipine Unknown-Unspecified     Fatigue   . Rosuvastatin Other- (not listed) - Allergy and Unknown-Unspecified          . Atorvastatin Diarrhea   . Ezetimibe Other- (not listed) - Allergy and Unknown-Unspecified  Anticoagulants:  Anticoagulants Administered (last 24 hours)       None             Objective:     Physical Exam:      BP 130/80   Pulse (!) 55   Temp 98.2 F (36.8 C)   Wt 132 kg (290 lb 9.6 oz)   SpO2 98%   BMI 38.34 kg/m     General Appearance:  Alert, cooperative, no distress, appears stated age   Head:  Normocephalic,  atraumatic   Eyes:  No drainage   Throat: Mucus membranes moist    Neck: Supple, symmetrical, trachea midline   Extremities: Compression socks in place.  Well-healed right shoulder incision seen below   Skin: Skin color, texture, turgor normal, no rashes or lesions           Lines/Drains:       Wounds:  PH Wound 1 03/11/23 Surgical Shoulder Right Shoulder with staples and Pravena Wound care (Active)   Number of days: 84       QTcB   Date Value Ref Range Status   01/20/2023 435 ms Final       Labs:     General ID Labs:                             Results in Past 365 Days  Result Component Current Result Previous Result Ref Range   WBC 9.0 (03/21/2023) 7.6 (03/14/2023) 3.5 - 10.8 K/uL   Hemoglobin 16.1 (03/21/2023) 15.3 (03/14/2023) 12.7 - 17.2 g/dL   Hematocrit 19.1 (47/82/9562) 43.3 (03/14/2023) 39.4 - 51.6 %   MCV 93.5 (03/21/2023) 90.4 (03/14/2023) 79.0 - 100.0 fL   Platelets 121* (03/21/2023) 155 (03/14/2023) 150 - 400 K/uL   Neutrophils, % 64.4 (03/21/2023) 60.4 (03/14/2023)   %   Lymphocytes, % 20.4 (03/21/2023) 17.3 (03/14/2023)   %   Monocytes, % 6.8 (03/21/2023) 10.2 (03/14/2023) %   Eosinophils % 6.2 (03/21/2023) 11.0 (03/14/2023) %   Basophils % 1.0 (03/21/2023) 0.7 (03/14/2023) %   Protein, Total 7.1 (01/20/2023) Not in Time Range 6.4 - 8.3 g/dL   Albumin 3.8 (13/10/6576) Not in Time Range 3.5 - 5.2 g/dL   AST 40 (46/96/2952) Not in Time Range 10 - 50 IU/L   ALT 39 (01/20/2023) Not in Time Range 10 - 50 IU/L   Bilirubin, Total 1.3* (01/20/2023) Not in Time Range 0.1 - 1.2 mg/dL   eGFR 74 (84/13/2440) 74 (03/14/2023) >59 mL/min/1.73 m2        Microbiology:   Reviewed    Recent Imaging:   Reviewed and pertinent findings are noted above in impression    Care Coordination:  chart review and counseling patient/family regarding diagnosis, prognosis, and treatment plan (including risks and benefits)    I spent a total of 35 minutes, on the date of the visit, personally performing services related to this  encounter. The total time includes face-to-face, pre and post service times.      Note may have been generated with voice recognition software Dragon minor errors or discrepancy may occur.    Signed By: Karna Dupes, MD     June 03, 2023

## 2023-06-03 NOTE — Progress Notes (Signed)
 [facility]  Procedure Note    [facility]  MEDICAL RECORD NUMBER: [id number]  AGE: 68 y.o.     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, right (HCC) (Chronic)       Assessment  SP Mohs procedure; surgical site sutures ruptured immediately post op, likely due to swelling  History of swelling; does not wear compression  Wound bed is pink tissue with slough; no localized edema or periwound erythema to suggest infection  Plan  Excisional debridement remove devitalized tissue and promote wound healing  Wound care: Vashe cleanse, medihoney, Hydrofera Blue, change three times weekly; agreeable to try multilayer compression  RTC 1 week for dressing change, 2 weeks for APC visit            Lower extremity edema     Procedure Note: Excisional Debridement  Indications: Based on my examination of the patient's wound(s) today, debridement is required to remove devitalized tissue, evaluate the wound base, and promote wound healing.  Performed by: Marcene Duos, APRN - NP  Consent obtained: Yes  Time out taken: Yes  Pain control:     Wound #: 1  Diabetic/pressure/chronic non-pressure ulcers only: non-pressure ulcer, fat layer exposed was/were debrided using curette. The wound(s) was/were debrided down through/to subcutaneous tissue. Devitalized tissue debrided: biofilm, slough, and exudate. Pre and post debridement measurements: see flow  sheet.    Puncture 05/22/23 Femoral (Active)   Wound Assessment Other (Comment) 05/23/23 0400   Closure Sutures;Other (Comment) 05/23/23 0756   Drainage Amount None 05/23/23 0756   Dressing/Treatment Gauze dressing/dressing sponge;Tegaderm/transparent film dressing 05/23/23 0756   Dressing Status Old drainage noted 05/23/23 0756   Number of days: 11       Puncture 05/22/23 Femoral (Active)   Wound Assessment Other (Comment) 05/23/23 0400   Closure Other (Comment) 05/23/23 0756   Drainage Amount None 05/23/23 0756   Dressing/Treatment Gauze dressing/dressing sponge;Tegaderm/transparent film dressing 05/23/23 0756   Dressing Status Clean, dry & intact 05/23/23 0756   Number of days: 11       Wound 06/07/21 Knee Right;Posterior (Active)   Number of days: 725       Wound 05/20/23 Pretibial Right;Lower #1 (Active)   Wound Image    06/03/23 0815   Wound Etiology Other 06/03/23 0815   Dressing Status Old drainage noted 06/03/23 0815   Wound Cleansed Cleansed with saline 06/03/23 0815   Dressing/Treatment Honey gel/honey paste 06/03/23 0815   Wound Length (cm) 2.5 cm 06/03/23 0815   Wound Width (cm) 2.7 cm 06/03/23 0815   Wound Depth (cm) 0.2 cm 06/03/23 0815   Wound Surface Area (cm^2) 6.75 cm^2 06/03/23 0815   Change in Wound Size % (l*w) 13.46 06/03/23 0815  Wound Volume (cm^3) 1.35 cm^3 06/03/23 0815   Wound Healing % 13 06/03/23 0815   Post-Procedure Length (cm) 2.5 cm 06/03/23 0815   Post-Procedure Width (cm) 2.8 cm 06/03/23 0815   Post-Procedure Depth (cm) 0.2 cm 06/03/23 0815   Post-Procedure Surface Area (cm^2) 7 cm^2 06/03/23 0815   Post-Procedure Volume (cm^3) 1.4 cm^3 06/03/23 0815   Wound Assessment Pink/red;Slough 06/03/23 0815   Drainage Amount Moderate (25-50%) 06/03/23 0815   Drainage Description Serosanguinous 06/03/23 0815   Odor None 06/03/23 0815   Peri-wound Assessment Maceration 06/03/23 0815   Wound Thickness Description not for Pressure Injury Full thickness 06/03/23 0815   Number of days: 13          Total surface area debrided: 7 sq cm   Estimated blood loss: minimal amount blood loss  Hemostasis achieved: pressure  Procedural pain: 0 / 10   Post procedural pain: 0 / 10   Response to treatment: tolerated procedure well with no complaints of pain    This dictation may have been generated in part by voice recognition computer software. Although all attempts are made to edit the dictation for accuracy, there may be errors in the transcription that are not intended.

## 2023-06-04 MED ORDER — METOPROLOL SUCCINATE ER 50 MG PO TB24
50 MG | ORAL_TABLET | Freq: Two times a day (BID) | ORAL | 3 refills | Status: DC
Start: 2023-06-04 — End: 2023-07-01

## 2023-06-04 NOTE — Telephone Encounter (Signed)
 Advised patient of Dr. Hillard Danker response. Advised patient to continue to monitor BP and HR and call with any further questions or concerns. Patient verbalized understanding.   Requested Prescriptions     Signed Prescriptions Disp Refills    metoprolol succinate (TOPROL XL) 50 MG extended release tablet 90 tablet 3     Sig: Take 0.5 tablets by mouth 2 times daily     Authorizing Provider: [name]     Ordering User: Bea Laura

## 2023-06-04 NOTE — Telephone Encounter (Signed)
 [name], MD  Bea Laura, RN  Caller: Unspecified (Today, 12:28 PM)  Rather, have him drop it to 25 mg twice daily since he is on 50 mg twice daily.

## 2023-06-04 NOTE — Telephone Encounter (Signed)
 Low HR  (Newest Message First)  06/04/23 12:18 PM  [name], MA routed this conversation to [facility] Cardiology Triage  Chad Rosario to [facility] Cardiology Clinical Staff (supporting [name], MD)         06/04/23 12:18 PM  Hello,  I have been feeling better since my ablation.  There are times I don't feel normal.  My resting HR has been running 50-55.  This morning is was 49.  Before my resent issues my HR was in the 60's.     I currently take 50mg  of Metoprolol ER Succinate twice a day.  Can Metoprolol be the cause of my lower HR?     Anyway, I just wanted to make you aware.     Thank you!

## 2023-06-04 NOTE — Telephone Encounter (Signed)
 See triage note.

## 2023-06-06 ENCOUNTER — Telehealth: Admit: 2023-06-06 | Discharge: 2023-06-06 | Payer: MEDICARE | Primary: Family Medicine

## 2023-06-06 DIAGNOSIS — R7989 Other specified abnormal findings of blood chemistry: Secondary | ICD-10-CM

## 2023-06-06 NOTE — Progress Notes (Signed)
 Chad Rosario, was evaluated through a synchronous (real-time) audio-video encounter. The patient (or guardian if applicable) is aware that this is a billable service, which includes applicable co-pays. This Virtual Visit was conducted with patient's (and/or legal guardian's) consent. Patient identification was verified, and a caregiver was present when appropriate.   The patient was located at Home: [address] [facility] [address]  Confirm you are appropriately licensed, registered, or certified to deliver care in the state where the patient is located as indicated above. If you are not or unsure, please re-schedule the visit: Yes, I confirm.     Chad Rosario (DOB:  1955/07/27) is a Established patient, presenting virtually for evaluation of the following:    Patient presents today for follow up of elevated LFTs and thrombocytopenia. Hepatitis panel negative. Fibroscan showing normal/mild fibrosis. Patient has no complaints today. Recently underwent ablation for afib.       Below is the assessment and plan developed based on review of pertinent history, physical exam, labs, studies, and medications.    Assessment and Plan:   Elevated LFTs. Fibroscan consistent with METAVIR score F1 (normal/mild fibrosis). With this degree of fibrosis would not expect such severe thrombocytopenia.   Thrombocytopenia and splenomegaly     - Fibrosure ordered, if consistent with more severe fibrosis will obtain liver biopsy   - Labs ordered to complete evaluation for genetic or autoimmune causes of liver disease       On this date 06/06/2023 I have spent 30 minutes reviewing previous notes, test results and face to face (virtual) with the patient discussing the diagnosis and importance of compliance with the treatment plan as well as documenting on the day of the visit.    --Cynda Familia, MD

## 2023-06-09 NOTE — Care Coordination-Inpatient (Signed)
 Care Transitions Note    Follow Up Call     Attempted to reach patient for transitions of care follow up.  Unable to reach patient.      Outreach Attempts:   HIPAA compliant voicemail left for patient.     Care Summary Note: unable to reach.  Per noted documentation, has attended scheduled follow up appointments,.     Follow Up Appointment:   Future Appointments         Provider Specialty Dept Phone    06/10/2023 8:20 AM Harmon Dun, RN Wound Ostomy [phone]    06/16/2023 10:45 AM Devona Konig Harriet Butte, MD Cardiology [phone]    06/17/2023 8:20 AM Marcene Duos, APRN - NP; Bobetta Lime, PT Wound Ostomy [phone]    07/04/2023 12:00 PM Chesley Mires, MD Neurology [phone]    08/08/2023 9:15 AM [name], MD Family Medicine [phone]    08/12/2023 8:45 AM [name], MD Family Medicine [phone]    08/19/2023 9:30 AM [name], MD Cardiology [phone]    09/29/2023 8:20 AM Lance Sell, APRN - CNP Sleep Medicine [phone]            Plan for follow-up call in 6-10 days based on severity of symptoms and risk factors. Plan for next call: self management-diet, hydration, exercise, follow up appointments.     Clayton Bibles, LPN

## 2023-06-10 ENCOUNTER — Inpatient Hospital Stay: Admit: 2023-06-10 | Discharge: 2023-06-10 | Payer: MEDICARE | Primary: Family Medicine

## 2023-06-10 ENCOUNTER — Encounter

## 2023-06-10 VITALS — BP 138/75 | HR 60 | Temp 98.00000°F | Resp 12 | Ht 73.0 in | Wt 290.0 lb

## 2023-06-10 DIAGNOSIS — Z4800 Encounter for change or removal of nonsurgical wound dressing: Secondary | ICD-10-CM

## 2023-06-10 NOTE — Other (Signed)
 06/10/23 0828   Right Leg Edema Point of Measurement   Leg circumference 45 cm   Ankle circumference 25 cm   Foot circumference 26 cm   Compression Therapy 2 layer compression wrap   Wound 05/20/23 Pretibial Right;Lower #1   Date First Assessed/Time First Assessed: 05/20/23 1412   Present on Original Admission: Yes  Wound Approximate Age at First Assessment (Weeks): 2 weeks  Primary Wound Type: (c) Venous Ulcer  Location: Pretibial  Wound Location Orientation: Right;Lower...   Wound Image    Wound Etiology Surgical  (Moh's Surgery)   Dressing Status Old drainage noted   Wound Cleansed Cleansed with saline;Vashe   Dressing/Treatment Honey gel/honey paste;Hydrofera blue   Wound Length (cm) 3.2 cm   Wound Width (cm) 2.7 cm   Wound Depth (cm) 0.2 cm   Wound Surface Area (cm^2) 8.64 cm^2   Change in Wound Size % (l*w) -10.77   Wound Volume (cm^3) 1.728 cm^3   Wound Healing % -11   Wound Assessment Pink/red;Slough;Granulation tissue   Drainage Amount Moderate (25-50%)   Drainage Description Serosanguinous   Odor None   Peri-wound Assessment Intact   Margins Attached edges   Wound Thickness Description not for Pressure Injury Full thickness   Pain Assessment   Pain Assessment None - Denies Pain     Clinician visit only.  Patient takes Xarelto.  Wound mechanically debrided with gauze and normal saline.

## 2023-06-10 NOTE — Wound Image (Signed)
 Multilayer Compression Wrap   (Not Unna) Below the Knee    NAME:  Chad Rosario  DATE OF BIRTH:  [birth year]  MEDICAL RECORD NUMBER:  [id number]  DATE:  06/10/2023    Multilayer compression wrap: Removed old Multilayer wrap if indicated and wash leg with mild soap/water.  Applied moisturizing agent to dry skin as needed.   Applied primary and secondary dressing as ordered.  Applied multilayered dressing below the knee to right lower leg.  Instructed patient/caregiver not to remove dressing and to keep it clean and dry.   Instructed patient/caregiver on complications to report to provider, such as pain, numbness in toes, heavy drainage, and slippage of dressing.  Instructed patient on purpose of compression dressing and on activity and exercise recommendations.      Electronically signed by Marcos Eke. Garrel Ridgel, RN on 06/10/2023 at 4:14 PM

## 2023-06-11 LAB — CERULOPLASMIN: Ceruloplasmin: 20.5 mg/dL (ref 16.0–31.0)

## 2023-06-11 LAB — ALPHA-1-ANTITRYPSIN: A-1 Antitrypsin: 155 mg/dL (ref 101–187)

## 2023-06-11 LAB — IGG, IGA, IGM
IgA: 404 mg/dL (ref 61–437)
IgG, Serum: 1457 mg/dL (ref 603–1613)

## 2023-06-11 LAB — MITOCHONDRIAL ANTIBODIES, M2: Mitochondrial M2 Ab, IgG: <20 U (ref 0.0–20.0)

## 2023-06-11 LAB — ANTI-SMOOTH MUSCLE ANTIBODY: Smooth Muscle Ab: 32 U — ABNORMAL HIGH (ref 0–19)

## 2023-06-11 LAB — LIVER-KIDNEY MICROSOME (LKM-1) ANTIBODY (IGG): Liver-Kidney Microsome-1 Ab IgG: 1 U (ref 0.0–20.0)

## 2023-06-13 LAB — NASH FIBROSUREÂ® PLUS
ALT: 38 IU/L (ref 0–55)
AST (SGOT)   (NASH), 13987: 47 IU/L — ABNORMAL HIGH (ref 0–40)
Alpha 2-Macroglobulins, Qn: 268 mg/dL (ref 110–276)
Apolipoprotein A-1: 167 mg/dL (ref 101–178)
Cholesterol, Total: 161 mg/dL (ref 100–199)
Fibrosis Score: 0.47 — ABNORMAL HIGH (ref 0.00–0.21)
GGT: 24 IU/L (ref 0–65)
Glucose: 109 mg/dL — ABNORMAL HIGH (ref 70–99)
Haptoglobin: 82 mg/dL (ref 32–363)
NASH - Steatosis Score: 0.36 (ref 0.00–0.40)
NASH Score: 0 (ref 0.00–0.25)
Total Bilirubin: 0.6 mg/dL (ref 0.0–1.2)
Triglycerides: 154 mg/dL — ABNORMAL HIGH (ref 0–149)

## 2023-06-16 ENCOUNTER — Institutional Professional Consult (permissible substitution)
Admit: 2023-06-16 | Discharge: 2023-06-16 | Payer: MEDICARE | Attending: Cardiovascular Disease | Primary: Family Medicine

## 2023-06-16 ENCOUNTER — Encounter: Payer: MEDICARE | Attending: Cardiovascular Disease | Primary: Family Medicine

## 2023-06-16 VITALS — BP 132/78 | HR 61 | Ht 73.0 in | Wt 295.0 lb

## 2023-06-16 DIAGNOSIS — I4819 Other persistent atrial fibrillation: Secondary | ICD-10-CM

## 2023-06-16 NOTE — Progress Notes (Signed)
 [facility]  [address], [address]  [place], [place] [address]  PHONE: [phone]        06/16/23      NAME:  Chad Rosario  DOB: 1955-09-24  MRN: [id number]     Referring Cardiologist: Hendricks Limes, MD and Amelia Jo, MD     Reason for Consultation: Atrial fibrillation      ASSESSMENT and PLAN:  Atrial fibrillation, mildly persistent, CHADS2VASc = 4 s/p AF ablation (PFA) on 04/2023  HFpEF  CAD s/p CABG  Thrombocytopenia  HTN  HLD  DVT     68 year old male with a history of mildly persistent AF admitted overnight with recurrent severely symptomatic AF with RVR and he is s/p AF ablation.      AF, persistent   -Continue BB, reduce dose to 25 mg po nightly.   -s/p AF ablation. Remains in NSR.  -Fatigued but improving.      Stroke ppx  -Continue Xarelto.      HFpEF  -Continue GDMT as tolerated.   -Plan for AF ablation.      DVT  -Long term use of OAC. Continue Xarelto.      Thrombocytopenia  -currently undergoing evaluation with hematology. Has been >50K and stable. Discussed with pt.     Routine cardiac care per Dr. Freddi Starr.     EP follow up in 3-4 months or PRN.     Patient has been instructed and agrees to call our office with any issues or other concerns related to their cardiac condition(s) and/or complaint(s).    No follow-up provider specified.    Thank you for allowing me to participate in the electrophysiologic care of [name] Please contact me if any questions or concerns were to arise.    Harriet Butte. Shamiyah Ngu MD, MS  Clinical Cardiac Electrophysiology  [facility] Cardiology  06/16/23  11:17 AM    ===================================================================  Chief Complant:    Chief Complaint   Patient presents with    Consultation    Atrial Fibrillation        History:  [name] is a most pleasant 68 y.o. male with a past medical and cardiac history significant for chronic HFpEF, persistent atrial fibrillation, TAA, CAD status post CABG in 2015,  statin intolerance, hypertension, hyperlipidemia, secondary erythrocytosis, chronic venous insufficiency and DVT who presented to ED with irregular heart beat. He is followed by Dr. Freddi Starr. He presented to clinic at that time with DOE with an ECG consistent with AF with RVR in January. Similar presentation this time around. AF was newly diagnosed in January. He was directly admitted to the hospital. He underwent IV diuresis with documented -5 L. He had a TTE in January 2025 that noted a normal EF and mild MR. His ascending aorta was noted to be 4 cm. He was noted to have a moderately dilated LA. 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 He does have issues with a chronically low platelet count to which he sees hematology due to chronic liver disease?    Going to [place] in mid to late April.      Cardiac PMH: (Old records have been reviewed and summarized below)    AF ablation: 05/23/2023      EKG:  (EKG has been independently visualized by me with interpretation below): NSR, sinus arrhythmia, normal axis, no ischemia.     ECHO: 03/2023  Left Ventricle Normal left  ventricular systolic function with a visually estimated EF of 55 - 60%. Left ventricle size is normal. Normal wall thickness. Normal wall motion. Indeterminate diastolic function due to atrial fibrillation.   Left Atrium Left atrium is moderately dilated. LA Vol Index is  39-45 ml/m2.   Right Ventricle Right ventricle size is normal. Normal systolic function. TAPSE is 1.9 cm.   Right Atrium Right atrium is mildly dilated.   Aortic Valve Moderately thickened cusps. Mildly calcified cusps. Moderate sclerosis of the aortic valve cusps. No regurgitation. No stenosis.   Mitral Valve Moderate annular calcification. Mildly thickened, at the anterior leaflet. Mild regurgitation. No stenosis noted.   Tricuspid Valve Valve structure is normal. Trace regurgitation. No stenosis noted. Unable to assess  RVSP due to inadequate or insignificant tricuspid regurgitation.   Pulmonic Valve Valve structure is normal. Physiologically normal regurgitation. No stenosis noted.   Aorta Normal sized aortic root. Ao root diameter is 3.2 cm. Mildly dilated ascending aorta. Ao ascending diameter is 4.0 cm.   IVC/Hepatic Veins IVC diameter is less than or equal to 21 mm and decreases greater than 50% during inspiration; therefore the estimated right atrial pressure is normal (~3 mmHg). IVC size is normal.   Pericardium No pericardial effusion.      Previous Heart Catheterization: n/a     Stress Test: n/a     DEVICE INTERROGATION: n/a     Past Medical History, Past Surgical History, Family history, Social History, and Medications were all reviewed with the patient today and updated as necessary.     Current Outpatient Medications   Medication Sig Dispense Refill    azelastine (ASTEPRO) 137 MCG/SPRAY nasal spray USE 1 TO 2 SPRAYS IN EACH NOSTRIL TWICE DAILY AS NEEDED AS DIRECTED      metoprolol succinate (TOPROL XL) 50 MG extended release tablet Take 0.5 tablets by mouth 2 times daily 90 tablet 3    zolpidem (AMBIEN) 5 MG tablet Take 1 tablet by mouth nightly as needed for Sleep for up to 90 days. Max Daily Amount: 5 mg 30 tablet 2    lisinopril (PRINIVIL;ZESTRIL) 20 MG tablet Take 2 tablets by mouth daily 180 tablet 1    rivaroxaban (XARELTO) 20 MG TABS tablet Take 1 tablet by mouth daily (with breakfast) 30 tablet 11    furosemide (LASIX) 40 MG tablet Take 1 tablet by mouth daily as needed (take one tablet daily for weight garin greater than 2 lbs in 24 hours or 4 lbs in 48 hours.) 30 tablet 3    doxycycline hyclate (VIBRAMYCIN) 100 MG capsule Take 1 capsule by mouth 2 times daily      Cyanocobalamin (VITAMIN B 12 PO) Take by mouth every morning      allopurinol (ZYLOPRIM) 300 MG tablet TAKE 1 TABLET BY MOUTH DAILY 90 tablet 3    gabapentin (NEURONTIN) 600 MG tablet Take 1 tablet by mouth in the morning, at noon, in the evening,  and at bedtime. 360 tablet 3    Multiple Vitamins-Minerals (THERAPEUTIC MULTIVITAMIN-MINERALS) tablet Take 1 tablet by mouth daily      vitamin D3 (CHOLECALCIFEROL) 10 MCG (400 UNIT) TABS tablet Take 2 tablets by mouth daily      CPAP Machine MISC by Does not apply route      colchicine (MITIGARE) 0.6 MG capsule Take 1 capsule by mouth as needed for Pain 30 capsule 3    fexofenadine (ALLEGRA) 180 MG tablet Take 1 tablet by mouth daily       No  current facility-administered medications for this visit.     Allergies   Allergen Reactions    Hydrocodone-Acetaminophen Anxiety and Itching    Losartan Shortness Of Breath    Amlodipine Other (See Comments)     Other reaction(s): Other (See Comments)  Fatigue   Fatigue       Rosuvastatin Other (See Comments)     Other reaction(s): Constipation  Fatigue, constipation  Other reaction(s): Fatigue    Atorvastatin Diarrhea    Ezetimibe      Other reaction(s): Constipation, Other (See Comments)  Constipation and decreased urine output  Constipation and decreased urine output  Other reaction(s): Constipation, Urine output low         Past Medical History:   Diagnosis Date    CAD (coronary artery disease) 2015    Gout     Hypertension     controlled with meds    Neuropathy May 2019    Osteoarthritis 03/2013    Sleep apnea     cpap at night     Past Surgical History:   Procedure Laterality Date    BACK SURGERY  2021    has had 3 different ones last was in 2021    CARDIAC SURGERY      CABG    CHOLECYSTECTOMY      COLONOSCOPY N/A 11/29/2020    COLONOSCOPY POLYPECTOMY HOT SNARE performed by Drucie Opitz, MD at [facility] ENDOSCOPY    EP DEVICE PROCEDURE N/A 05/22/2023    Ablation A-fib w complete ep study performed by Rhett Bannister, MD at [facility] CARDIAC CATH LAB    EYE SURGERY Left     cataract    FRACTURE SURGERY Left     tib-fib fracture    JOINT REPLACEMENT  Hip - 2018    KNEE ARTHROSCOPY Right     TOTAL HIP ARTHROPLASTY Left      Family History   Problem Relation Age of Onset    Cancer  Mother         Lymp    Stroke Father     Cancer Sister         Breast    Clotting Disorder Maternal Grandfather      Social History     Tobacco Use    Smoking status: Never    Smokeless tobacco: Never   Substance Use Topics    Alcohol use: Yes     Alcohol/week: 12.0 standard drinks of alcohol     Types: 6 Cans of beer, 6 Shots of liquor per week     Comment: socially       ROS:  A comprehensive review of systems was performed with the pertinent positives and negatives as noted in the HPI in addition to:  Review of Systems      PHYSICAL EXAM:   BP 132/78   Pulse 61   Ht 1.854 m (6\' 1" )   Wt 133.8 kg (295 lb)   BMI 38.92 kg/m      Wt Readings from Last 3 Encounters:   06/16/23 133.8 kg (295 lb)   06/10/23 131.5 kg (290 lb)   06/03/23 131.5 kg (290 lb)     BP Readings from Last 3 Encounters:   06/16/23 132/78   06/10/23 138/75   06/03/23 (!) 142/77       Gen: Well appearing, well developed, no acute distress  Eyes: Pupils equal, round. Extraocular movements are intact  ENT: Oropharynx clear, no oral lesions, normal dentition  CV: S1S2, regular rate  and rhythm, no murmurs, rubs or gallops, normal JVD, no carotid bruits, normal distal pulses, no LEE  Pulm: Clear to auscultation bilaterally, no accessory muscle uses, no wheezes or rales  GI: Soft, NT, ND, +BS  Neuro: Alert and oriented, nonfocal  Psych: Appropriate affect  Skin: Normal color and skin turgor  MSK: Normal muscle bulk and tone    Medical problems and test results were reviewed with the patient today.     No results found for any visits on 06/16/23.

## 2023-06-16 NOTE — Care Coordination-Inpatient (Signed)
 Care Transitions Note    Follow Up Call     Patient Current Location:  Home: [address] [place] [facility] [address]    LPN Care Coordinator contacted the patient by telephone. Verified name and DOB as identifiers.    Additional needs identified to be addressed with provider   No needs identified                 Method of communication with provider: none.    Care Summary Note: States he is doing very well.  Has attended all scheduled follow up appointments. Has follow up with wound care on 06/17/2023.  Continues to report good appetite and hydration.  No needs or concerns at this time, has contact information if needed, Jyl Heinz LPN CC [phone].    Plan of care updates since last contact:  Education: Jyl Heinz LPN CC [phone].  All scheduled appointments.        Advance Care Planning:   Does patient have an Advance Directive: reviewed during previous call, see note. .    Medication Review:  No changes since last call.     Remote Patient Monitoring:  Offered patient enrollment in the Remote Patient Monitoring (RPM) program for in-home monitoring: Yes, but did not enroll at this time: na .    Assessments:  Please see above care summary note.    Follow Up Appointment:   Reviewed upcoming appointment(s).  Future Appointments         Provider Specialty Dept Phone    06/17/2023 8:20 AM Marcene Duos, APRN - NP; Bobetta Lime, PT Wound Ostomy [phone]    07/04/2023 12:00 PM Chesley Mires, MD Neurology [phone]    08/08/2023 9:15 AM [name], MD Family Medicine [phone]    08/12/2023 8:45 AM [name], MD Family Medicine [phone]    08/19/2023 7:30 AM [facility] Cardiology [phone]    08/19/2023 9:30 AM [name], MD Cardiology [phone]    09/12/2023 9:00 AM Rhett Bannister, MD Cardiology [phone]    09/29/2023 8:20 AM Lance Sell, APRN - CNP Sleep Medicine [phone]            LPN Care Coordinator provided contact information.   Plan for follow-up call in 6-10 days based on severity of symptoms and risk factors.  Plan for next call: self management-diet, hydration, exercise, follow up appointments.       Clayton Bibles, LPN

## 2023-06-17 ENCOUNTER — Inpatient Hospital Stay: Admit: 2023-06-17 | Discharge: 2023-06-17 | Payer: MEDICARE | Primary: Family Medicine

## 2023-06-17 VITALS — BP 127/78 | HR 61 | Temp 98.10000°F | Resp 18 | Ht 73.0 in | Wt 286.0 lb

## 2023-06-17 DIAGNOSIS — T8131XA Disruption of external operation (surgical) wound, not elsewhere classified, initial encounter: Secondary | ICD-10-CM

## 2023-06-17 MED ORDER — LIDOCAINE HCL URETHRAL/MUCOSAL 2 % EX GEL
2 | CUTANEOUS | Status: DC | PRN
Start: 2023-06-17 — End: 2023-06-18
  Administered 2023-06-17: 13:00:00 via TOPICAL

## 2023-06-17 MED ORDER — SILVER NITRATE-POT NITRATE 75-25 % EX MISC
75-25 | CUTANEOUS | Status: DC | PRN
Start: 2023-06-17 — End: 2023-06-18
  Administered 2023-06-17: 13:00:00 1 via TOPICAL

## 2023-06-17 NOTE — Discharge Instructions (Signed)
 Return Appointment:   1 week with Orlie Pollen, NP           Instructions: Right lower leg:  Cleanse with Vashe (may purchase at [facility]) let sit on wound for at least one minute  Apply Medihoney (brought from home; may use [employer] brand, if desired) to wound base;   Apply oil emulsion dressing  Cover with Hydrofera Ready: Cut to wound size, place in wound bed, shiny side out.    Cover with ABD  2 layer compression with wound and lower extremity assessment.  If there is any problem with the dressing (too tight, slides down, etc.) Patient to return to wound clinic to have re-wrapped by clinician.   Change dressing weekly     Elevate legs when sitting.  Avoid prolonged standing or sitting with legs in dependent position.     Elevate legs while sitting  Eat minimum of 100 grams of protein per day  Protein:   Egg ~ 6g  Yogurt ~ 15g  Half cup of cottage cheese ~ 15g  Chicken breast ~ 30g  Salmon filet ~ 40g

## 2023-06-17 NOTE — Other (Signed)
 06/17/23 0845   Right Leg Edema Point of Measurement   Leg circumference 41 cm   Ankle circumference 24.5 cm   Foot circumference 25 cm   Compression Therapy 2 layer compression wrap   Wound 05/20/23 Pretibial Right;Lower #1   Date First Assessed/Time First Assessed: 05/20/23 1412   Present on Original Admission: Yes  Wound Approximate Age at First Assessment (Weeks): 2 weeks  Primary Wound Type: (c) Venous Ulcer  Location: Pretibial  Wound Location Orientation: Right;Lower...   Wound Image     Wound Etiology Surgical   Dressing Status Old drainage noted   Wound Cleansed Cleansed with saline   Dressing/Treatment Hydrofera blue   Wound Length (cm) 3 cm   Wound Width (cm) 3 cm   Wound Depth (cm) 0.1 cm   Wound Surface Area (cm^2) 9 cm^2   Change in Wound Size % (l*w) -15.38   Wound Volume (cm^3) 0.9 cm^3   Wound Healing % 42   Post-Procedure Length (cm) 3 cm   Post-Procedure Width (cm) 3 cm   Post-Procedure Depth (cm) 0.1 cm   Post-Procedure Surface Area (cm^2) 9 cm^2   Post-Procedure Volume (cm^3) 0.9 cm^3   Wound Assessment Pink/red;Slough   Drainage Amount Moderate (25-50%)   Drainage Description Serosanguinous   Odor None   Peri-wound Assessment Intact   Wound Thickness Description not for Pressure Injury Full thickness   Pain Assessment   Pain Assessment None - Denies Pain     Pre- and post-debridement. Patient is currently taking Xarelto

## 2023-06-17 NOTE — Wound Image (Addendum)
 Discharge Instructions for  [facility]  [address]  [address]  [place], [place] [address]  Phone [phone]   Fax [phone]      NAME:  Chad Rosario  DATE OF BIRTH:  [birth year]  MEDICAL RECORD NUMBER:  [id number]  DATE:  06/17/2023    Return Appointment:   1 week with Orlie Pollen, NP        Instructions: Right lower leg:  Cleanse with Vashe (may purchase at [facility]) let sit on wound for at least one minute  Apply Medihoney (brought from home; may use [employer] brand, if desired) to wound base;   Apply oil emulsion dressing  Cover with Hydrofera Ready: Cut to wound size, place in wound bed, shiny side out.    Cover with ABD  2 layer compression with wound and lower extremity assessment.  If there is any problem with the dressing (too tight, slides down, etc.) Patient to return to wound clinic to have re-wrapped by clinician.   Change dressing weekly    Elevate legs when sitting.  Avoid prolonged standing or sitting with legs in dependent position.     Elevate legs while sitting  Eat minimum of 100 grams of protein per day  Protein:   Egg ~ 6g  Yogurt ~ 15g  Half cup of cottage cheese ~ 15g  Chicken breast ~ 30g  Salmon filet ~ 40g     Should you experience increased redness, swelling, pain, foul odor, size of wound(s), or have a temperature over 101 degrees please contact the Wound Healing Center at [phone] or if after hours contact your primary care physician or go to the hospital emergency department.    PLEASE NOTE: IF YOU ARE UNABLE TO OBTAIN WOUND SUPPLIES, CONTINUE TO USE THE SUPPLIES YOU HAVE AVAILABLE UNTIL YOU ARE ABLE TO REACH [place]. IT IS MOST IMPORTANT TO KEEP THE WOUND COVERED AT ALL TIMES.    Electronically signed Bobetta Lime, PT, [facility] on 06/17/2023 at 8:59 AM

## 2023-06-17 NOTE — Assessment & Plan Note (Signed)
 Assessment  Wound bed is red granulation/hypergranulation with less slough; no localized edema or periwound erythema to suggest infection  Edema is improved with multilayer compression  Plan  Excisional debridement remove devitalized tissue and promote wound healing  Wound care: Vashe cleanse, medihoney, Hydrofera Blue, multilayer compression weekly  RTC 1 week

## 2023-06-17 NOTE — Progress Notes (Signed)
 [facility]  Procedure Note    Chad Rosario [facility]  MEDICAL RECORD NUMBER: [id number]  AGE: 68 y.o.     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, right (HCC) (Chronic)    Assessment  Wound bed is red granulation/hypergranulation with less slough; no localized edema or periwound erythema to suggest infection  Edema is improved with multilayer compression  Plan  Excisional debridement remove devitalized tissue and promote wound healing  Wound care: Vashe cleanse, medihoney, Hydrofera Blue, multilayer compression weekly  RTC 1 week         Lower extremity edema     Procedure Note: Excisional Debridement  Indications: Based on my examination of the patient's wound(s) today, debridement is required to remove devitalized tissue, evaluate the wound base, and promote wound healing.  Performed by: Marcene Duos, APRN - NP  Consent obtained: Yes  Time out taken: Yes  Pain control:     Wound #: 1  Diabetic/pressure/chronic non-pressure ulcers only: non-pressure ulcer, fat layer exposed was/were debrided using curette. The wound(s) was/were debrided down through/to subcutaneous tissue. Devitalized tissue debrided: biofilm, slough, and exudate. Pre and post debridement measurements: see flow sheet.    Puncture 05/22/23 Femoral (Active)   Site Assessment Other (Comment) 05/23/23 0400   Closure Sutures;Other (Comment)  05/23/23 0756   Drainage Amount None 05/23/23 0756   Dressing/Treatment Gauze dressing/dressing sponge;Tegaderm/transparent film dressing 05/23/23 0756   Dressing Status Old drainage noted 05/23/23 0756   Number of days: 25       Puncture 05/22/23 Femoral (Active)   Site Assessment Other (Comment) 05/23/23 0400   Closure Other (Comment) 05/23/23 0756   Drainage Amount None 05/23/23 0756   Dressing/Treatment Gauze dressing/dressing sponge;Tegaderm/transparent film dressing 05/23/23 0756   Dressing Status Clean, dry & intact 05/23/23 0756   Number of days: 25       Wound 06/07/21 Knee Right;Posterior (Active)   Number of days: 740       Wound 05/20/23 Pretibial Right;Lower #1 (Active)   Wound Image    06/17/23 0845   Wound Etiology Surgical 06/17/23 0845   Dressing Status Old drainage noted 06/17/23 0845   Wound Cleansed Cleansed with saline 06/17/23 0845   Dressing/Treatment Hydrofera blue 06/17/23 0845   Wound Length (cm) 3 cm 06/17/23 0845   Wound Width (cm) 3 cm 06/17/23 0845   Wound Depth (cm) 0.1 cm 06/17/23 0845   Wound Surface Area (cm^2) 9 cm^2 06/17/23 0845   Change in Wound Size % (l*w) -15.38 06/17/23 0845   Wound Volume (cm^3) 0.9 cm^3 06/17/23 0845   Wound Healing % 42 06/17/23 0845   Post-Procedure Length (cm) 3 cm 06/17/23 0845   Post-Procedure Width (cm) 3 cm 06/17/23 0845   Post-Procedure Depth (  cm) 0.1 cm 06/17/23 0845   Post-Procedure Surface Area (cm^2) 9 cm^2 06/17/23 0845   Post-Procedure Volume (cm^3) 0.9 cm^3 06/17/23 0845   Wound Assessment Pink/red;Slough 06/17/23 0845   Drainage Amount Moderate (25-50%) 06/17/23 0845   Drainage Description Serosanguinous 06/17/23 0845   Odor None 06/17/23 0845   Peri-wound Assessment Intact 06/17/23 0845   Margins Attached edges 06/10/23 0828   Wound Thickness Description not for Pressure Injury Full thickness 06/17/23 0845   Number of days: 28         Total surface area debrided: 9 sq cm   Estimated blood loss: minimal amount blood loss  Hemostasis  achieved: pressure and silver nitrate  Procedural pain: 0 / 10   Post procedural pain: 0 / 10   Response to treatment: tolerated procedure well with no complaints of pain    This dictation may have been generated in part by voice recognition computer software. Although all attempts are made to edit the dictation for accuracy, there may be errors in the transcription that are not intended.

## 2023-06-17 NOTE — Wound Image (Signed)
 Multilayer Compression Wrap   (Not Unna) Below the Knee    NAME:  Chad Rosario  DATE OF BIRTH:  06/28/1955  MEDICAL RECORD NUMBER:  [id number]  DATE:  06/17/2023    Multilayer compression wrap: Removed old Multilayer wrap if indicated and wash leg with mild soap/water.  Applied primary and secondary dressing as ordered.  Applied multilayered dressing below the knee to right lower leg.  Instructed patient/caregiver not to remove dressing and to keep it clean and dry.   Instructed patient/caregiver on complications to report to provider, such as pain, numbness in toes, heavy drainage, and slippage of dressing.  Instructed patient on purpose of compression dressing and on activity and exercise recommendations.      Electronically signed by Bobetta Lime, PT, WCC on 06/17/2023 at 9:31 AM

## 2023-06-20 NOTE — Telephone Encounter (Signed)
 Hi Chad Rosario,    I have called in the Eliquis to [facility]. I am not completely sure how long it will take to get to you but they say about 3-4 weeks. If you need samples in the mean time please let us know.      Have a great weekend.  Victorino Dike

## 2023-06-24 ENCOUNTER — Inpatient Hospital Stay: Admit: 2023-06-24 | Discharge: 2023-06-24 | Payer: MEDICARE | Primary: Family Medicine

## 2023-06-24 VITALS — BP 125/85 | HR 65 | Ht 73.0 in | Wt 286.0 lb

## 2023-06-24 DIAGNOSIS — I87311 Chronic venous hypertension (idiopathic) with ulcer of right lower extremity: Secondary | ICD-10-CM

## 2023-06-24 DIAGNOSIS — L97919 Non-pressure chronic ulcer of unspecified part of right lower leg with unspecified severity: Secondary | ICD-10-CM

## 2023-06-24 NOTE — Progress Notes (Signed)
 [facility]  Procedure Note    Chad Rosario [facility]  MEDICAL RECORD NUMBER: [id number]  AGE: 68 y.o.     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, right (HCC) (Chronic)    Assessment  Wound is smaller; wound bed is red granulation with minimal slough, no localized edema or periwound erythema to suggest infection  Edema is improved with multilayer compression  Plan  Excisional debridement remove devitalized tissue and promote wound healing  Wound care: Vashe cleanse, medihoney, Hydrofera Blue, multilayer compression weekly  RTC 1 week         Lower extremity edema     Procedure Note: Excisional Debridement  Indications: Based on my examination of the patient's wound(s) today, debridement is required to remove devitalized tissue, evaluate the wound base, and promote wound healing.  Performed by: Marcene Duos, APRN - NP  Consent obtained: Yes  Time out taken: Yes  Pain control:     Wound #: 1  Diabetic/pressure/chronic non-pressure ulcers only: non-pressure ulcer, fat layer exposed was/were debrided using curette. The wound(s) was/were debrided down through/to subcutaneous tissue. Devitalized tissue debrided: biofilm, slough, and exudate. Pre and post debridement measurements: see flow sheet.    Puncture 05/22/23 Femoral (Active)   Number of days: 32       Puncture 05/22/23 Femoral (Active)   Number of days: 32       Wound 06/07/21 Knee Right;Posterior (Active)   Number of days: 747       Wound 05/20/23 Pretibial Right;Lower #1 (Active)   Wound Image    06/24/23 0956   Wound Etiology Surgical 06/24/23 0956   Dressing Status Old drainage noted 06/24/23 0956   Wound Cleansed Cleansed with saline 06/24/23 0956    Dressing/Treatment Hydrofera blue 06/24/23 0956   Wound Length (cm) 2.4 cm 06/24/23 0956   Wound Width (cm) 2.5 cm 06/24/23 0956   Wound Depth (cm) 0.1 cm 06/24/23 0956   Wound Surface Area (cm^2) 6 cm^2 06/24/23 0956   Change in Wound Size % (l*w) 23.08 06/24/23 0956   Wound Volume (cm^3) 0.6 cm^3 06/24/23 0956   Wound Healing % 62 06/24/23 0956   Post-Procedure Length (cm) 2.4 cm 06/24/23 0956   Post-Procedure Width (cm) 2.5 cm 06/24/23 0956   Post-Procedure Depth (cm) 0.1 cm 06/24/23 0956   Post-Procedure Surface Area (cm^2) 6 cm^2 06/24/23 0956   Post-Procedure Volume (cm^3) 0.6 cm^3 06/24/23 0956   Wound Assessment Pink/red;Slough 06/24/23 0956   Drainage Amount Moderate (25-50%) 06/24/23 0956   Drainage Description Serosanguinous 06/24/23 0956   Odor None 06/24/23 0956   Peri-wound Assessment Intact 06/24/23 0956   Margins Attached edges 06/10/23 0828   Wound Thickness Description not for Pressure Injury Full thickness 06/24/23 0956   Number of days: 35         Total surface area debrided: 6 sq cm   Estimated blood loss: minimal amount blood loss  Hemostasis achieved: pressure  Procedural pain: 0 / 10   Post procedural pain: 0 / 10   Response to treatment: tolerated procedure well with no complaints of pain  This dictation may have been generated in part by voice recognition computer software. Although all attempts are made to edit the dictation for accuracy, there may be errors in the transcription that are not intended.

## 2023-06-24 NOTE — Wound Image (Signed)
 Discharge Instructions for  [facility]  [address]  [address]  [place], [place] [address]  Phone [phone]   Fax [phone]      NAME:  Chad Rosario  DATE OF BIRTH:  February 25, 1956  MEDICAL RECORD NUMBER:  [id number]  DATE:  06/24/2023    Return Appointment:   1 week with Orlie Pollen, NP        Instructions: Right lower leg:  Cleanse with Vashe (may purchase at [facility]) let sit on wound for at least one minute  Apply Medihoney (brought from home; may use [employer] brand, if desired) to wound base;   Apply oil emulsion dressing  Cover with Hydrofera Ready: Cut to wound size, place in wound bed, shiny side out.    Cover with ABD  2 layer compression with wound and lower extremity assessment.  If there is any problem with the dressing (too tight, slides down, etc.) Patient to return to wound clinic to have re-wrapped by clinician.   Change dressing weekly    Elevate legs when sitting.  Avoid prolonged standing or sitting with legs in dependent position.     Elevate legs while sitting  Eat minimum of 100 grams of protein per day  Protein:   Egg ~ 6g  Yogurt ~ 15g  Half cup of cottage cheese ~ 15g  Chicken breast ~ 30g  Salmon filet ~ 40g     Should you experience increased redness, swelling, pain, foul odor, size of wound(s), or have a temperature over 101 degrees please contact the Wound Healing Center at [phone] or if after hours contact your primary care physician or go to the hospital emergency department.    PLEASE NOTE: IF YOU ARE UNABLE TO OBTAIN WOUND SUPPLIES, CONTINUE TO USE THE SUPPLIES YOU HAVE AVAILABLE UNTIL YOU ARE ABLE TO REACH [place]. IT IS MOST IMPORTANT TO KEEP THE WOUND COVERED AT ALL TIMES.    Electronically signed Harmon Dun, RN, WCC on 06/24/2023 at 10:13 AM

## 2023-06-24 NOTE — Discharge Instructions (Signed)
 Discharge Instructions for  [facility]  [address]  [address]  [place], [place] [address]  Phone [phone]   Fax [phone]      NAME:  Chad Rosario  DATE OF BIRTH:  Aug 28, 1955  MEDICAL RECORD NUMBER:  [id number]  DATE:  @ED @    Return Appointment:   1 week with Orlie Pollen, NP      Instructions: Right lower leg:  Cleanse with Vashe (may purchase at [facility]) let sit on wound for at least one minute  Apply Medihoney (brought from home; may use [employer] brand, if desired) to wound base;   Apply oil emulsion dressing  Cover with Hydrofera Ready: Cut to wound size, place in wound bed, shiny side out.    Cover with ABD  2 layer compression with wound and lower extremity assessment.  If there is any problem with the dressing (too tight, slides down, etc.) Patient to return to wound clinic to have re-wrapped by clinician.   Change dressing weekly     Elevate legs when sitting.  Avoid prolonged standing or sitting with legs in dependent position.     Elevate legs while sitting  Eat minimum of 100 grams of protein per day  Protein:   Egg ~ 6g  Yogurt ~ 15g  Half cup of cottage cheese ~ 15g  Chicken breast ~ 30g        Should you experience increased redness, swelling, pain, foul odor, size of wound(s), or have a temperature over 101 degrees please contact the Wound Healing Center at [phone] or if after hours contact your primary care physician or go to the hospital emergency department.    PLEASE NOTE: IF YOU ARE UNABLE TO OBTAIN WOUND SUPPLIES, CONTINUE TO USE THE SUPPLIES YOU HAVE AVAILABLE UNTIL YOU ARE ABLE TO REACH [place]. IT IS MOST IMPORTANT TO KEEP THE WOUND COVERED AT ALL TIMES.    Electronically signed Harmon Dun, RN on 06/24/2023 at 10:13 AM

## 2023-06-24 NOTE — Other (Signed)
 06/24/23 0956   Right Leg Edema Point of Measurement   Leg circumference 43 cm   Ankle circumference 26 cm   Foot circumference 25 cm   Compression Therapy Compression ordered   Wound 05/20/23 Pretibial Right;Lower #1   Date First Assessed/Time First Assessed: 05/20/23 1412   Present on Original Admission: Yes  Wound Approximate Age at First Assessment (Weeks): 2 weeks  Primary Wound Type: (c) Venous Ulcer  Location: Pretibial  Wound Location Orientation: Right;Lower...   Wound Image     Wound Etiology Surgical   Dressing Status Old drainage noted   Wound Cleansed Cleansed with saline   Dressing/Treatment Hydrofera blue   Wound Length (cm) 2.4 cm   Wound Width (cm) 2.5 cm   Wound Depth (cm) 0.1 cm   Wound Surface Area (cm^2) 6 cm^2   Change in Wound Size % (l*w) 23.08   Wound Volume (cm^3) 0.6 cm^3   Wound Healing % 62   Post-Procedure Length (cm) 2.4 cm   Post-Procedure Width (cm) 2.5 cm   Post-Procedure Depth (cm) 0.1 cm   Post-Procedure Surface Area (cm^2) 6 cm^2   Post-Procedure Volume (cm^3) 0.6 cm^3   Wound Assessment Pink/red;Slough   Drainage Amount Moderate (25-50%)   Drainage Description Serosanguinous   Odor None   Peri-wound Assessment Intact   Wound Thickness Description not for Pressure Injury Full thickness     Post debridement

## 2023-06-24 NOTE — Other (Signed)
 06/24/23 0956   Right Leg Edema Point of Measurement   Leg circumference 43 cm   Ankle circumference 26 cm   Foot circumference 25 cm   Compression Therapy Compression ordered   Wound 05/20/23 Pretibial Right;Lower #1   Date First Assessed/Time First Assessed: 05/20/23 1412   Present on Original Admission: Yes  Wound Approximate Age at First Assessment (Weeks): 2 weeks  Primary Wound Type: (c) Venous Ulcer  Location: Pretibial  Wound Location Orientation: Right;Lower...   Wound Image    Wound Etiology Surgical   Dressing Status Old drainage noted   Wound Cleansed Cleansed with saline   Dressing/Treatment Hydrofera blue   Wound Length (cm) 2.4 cm   Wound Width (cm) 2.5 cm   Wound Depth (cm) 0.1 cm   Wound Surface Area (cm^2) 6 cm^2   Change in Wound Size % (l*w) 23.08   Wound Volume (cm^3) 0.6 cm^3   Wound Healing % 62   Wound Assessment Pink/red;Slough   Drainage Amount Moderate (25-50%)   Drainage Description Serosanguinous   Odor None   Peri-wound Assessment Intact   Wound Thickness Description not for Pressure Injury Full thickness

## 2023-06-24 NOTE — Assessment & Plan Note (Signed)
 Assessment  Wound is smaller; wound bed is red granulation with minimal slough, no localized edema or periwound erythema to suggest infection  Edema is improved with multilayer compression  Plan  Excisional debridement remove devitalized tissue and promote wound healing  Wound care: Vashe cleanse, medihoney, Hydrofera Blue, multilayer compression weekly  RTC 1 week

## 2023-06-25 NOTE — Care Coordination-Inpatient (Signed)
 Care Transitions Note    Final Call     Patient Current Location:  Home: [address] [place] [facility] [address]    LPN Care Coordinator contacted the patient by telephone. Verified name and DOB as identifiers.    Patient graduated from the Care Transitions program on 06/25/2023.  Patient/family verbalizes confidence in the ability to self-manage at this time..      Advance Care Planning:   Does patient have an Advance Directive: reviewed during previous call, see note. .    Handoff:   Patient was not referred to the [facility] team due to no additional needs identified.       Care Summary Note: States he is doing very well.  No needs or concerns, has contact information if needed, Jyl Heinz LPN CC [phone].    Assessments:  No changes since last call    Upcoming Appointments:    Future Appointments         Provider Specialty Dept Phone    07/02/2023 8:00 AM Marcene Duos, APRN - NP; Roxanne Gates, RN Wound Ostomy [phone]    07/04/2023 12:00 PM Chesley Mires, MD Neurology [phone]    08/08/2023 9:15 AM [name], MD Family Medicine [phone]    08/12/2023 8:45 AM [name], MD Family Medicine [phone]    08/19/2023 7:30 AM [facility] Cardiology [phone]    08/19/2023 9:30 AM [name], MD Cardiology [phone]    09/12/2023 9:00 AM Rhett Bannister, MD Cardiology [phone]    09/29/2023 8:20 AM Lance Sell, APRN - CNP Sleep Medicine [phone]            Patient has agreed to contact primary care provider and/or specialist for any further questions, concerns, or needs.    Clayton Bibles, LPN

## 2023-06-30 NOTE — Telephone Encounter (Signed)
 See 06/29/23 telephone encounter.

## 2023-06-30 NOTE — Telephone Encounter (Signed)
 Per medical record, Toprol XL was continued at last appointment with Dr. Freddi Starr on 04/29/23 and with Dr. Devona Konig on 06/16/23. Next scheduled appointment with Dr. Freddi Starr is 08/19/23. Next scheduled appointment with Dr. Devona Konig is 09/12/23.  Requested Prescriptions     Pending Prescriptions Disp Refills    metoprolol succinate (TOPROL XL) 50 MG extended release tablet [Pharmacy Med Name: METOPROLOL ER SUCCINATE 50MG  TABS] 90 tablet 3     Sig: TAKE 1 TABLET BY MOUTH TWICE DAILY     Pended Toprol XL refill, as above, sent to Dr. Freddi Starr for approval.

## 2023-06-30 NOTE — Telephone Encounter (Signed)
 Has already been sent

## 2023-06-30 NOTE — Telephone Encounter (Signed)
 Chad Rosario Chad Rosario to [name] [facility] Cardiology Clinical Staff (supporting [name], MD)         06/30/23 12:47 PM  I need a new prescription sent to      [facility]   [address]      Thank you

## 2023-07-01 MED ORDER — METOPROLOL SUCCINATE ER 25 MG PO TB24
25 | ORAL_TABLET | Freq: Every day | ORAL | 3 refills | Status: DC
Start: 2023-07-01 — End: 2024-02-17

## 2023-07-01 NOTE — Telephone Encounter (Signed)
-----   Message from Dr. Amelia Jo, MD sent at 06/30/2023  6:05 PM EDT -----  Should it be Toprol XL 25 mg BID?  It appears so according to telephone encounters and [facility]'s note. Please verify with patient what he is taking.

## 2023-07-01 NOTE — Telephone Encounter (Signed)
 Patient states he has been taking Toprol XL 50 mg 1/2 tab daily. He states Toprol XL was decreased to 1 x daily because his HR was too low when taking it BID.   Requested Prescriptions     Pending Prescriptions Disp Refills    metoprolol succinate (TOPROL XL) 25 MG extended release tablet 90 tablet 3     Sig: Take 1 tablet by mouth daily

## 2023-07-02 ENCOUNTER — Inpatient Hospital Stay: Admit: 2023-07-02 | Discharge: 2023-07-02 | Payer: MEDICARE | Attending: Family Medicine | Primary: Family Medicine

## 2023-07-02 VITALS — BP 158/79 | HR 67 | Temp 97.90000°F | Resp 16 | Ht 73.0 in | Wt 287.0 lb

## 2023-07-02 DIAGNOSIS — T8131XD Disruption of external operation (surgical) wound, not elsewhere classified, subsequent encounter: Secondary | ICD-10-CM

## 2023-07-02 DIAGNOSIS — T8131XA Disruption of external operation (surgical) wound, not elsewhere classified, initial encounter: Secondary | ICD-10-CM

## 2023-07-02 MED ORDER — LIDOCAINE HCL URETHRAL/MUCOSAL 2 % EX GEL
2 | CUTANEOUS | Status: DC | PRN
Start: 2023-07-02 — End: 2023-07-03
  Administered 2023-07-02: 12:00:00 via TOPICAL

## 2023-07-02 NOTE — Wound Image (Signed)
 Discharge Instructions for  [facility]  [address]  [address]  [place], [place] [address]  Phone [phone]   Fax [phone]      NAME:  Chad Rosario  DATE OF BIRTH:  11-08-1955  MEDICAL RECORD NUMBER:  [id number]  DATE:  07/02/2023    Return Appointment:   1 week with Orlie Pollen, NP    Instructions: Right lower leg:  Cleanse with Vashe (may purchase at [facility]) let sit on wound for at least one minute  Apply Medihoney (brought from home; may use [employer] brand, if desired) to wound base;   Apply oil emulsion dressing  Cover with Hydrofera Ready: Cut to wound size, place in wound bed, shiny side out.    Cover with ABD  2 layer compression with wound and lower extremity assessment.  If there is any problem with the dressing (too tight, slides down, etc.) Patient to return to wound clinic to have re-wrapped by clinician.   Change dressing weekly    Elevate legs when sitting.  Avoid prolonged standing or sitting with legs in dependent position.     Elevate legs while sitting  Eat minimum of 100 grams of protein per day  Protein:   Egg ~ 6g  Yogurt ~ 15g  Half cup of cottage cheese ~ 15g  Chicken breast ~ 30g  Salmon filet ~ 40g     Should you experience increased redness, swelling, pain, foul odor, size of wound(s), or have a temperature over 101 degrees please contact the [facility] Healing Center at [phone] or if after hours contact your primary care physician or go to the hospital emergency department.    PLEASE NOTE: IF YOU ARE UNABLE TO OBTAIN WOUND SUPPLIES, CONTINUE TO USE THE SUPPLIES YOU HAVE AVAILABLE UNTIL YOU ARE ABLE TO REACH [place]. IT IS MOST IMPORTANT TO KEEP THE WOUND COVERED AT ALL TIMES.    Electronically signed Fanny Skates, RN, WCC on 07/02/2023 at 8:16 AM

## 2023-07-02 NOTE — Other (Signed)
 07/02/23 0806   Wound 05/20/23 Pretibial Right;Lower #1   Date First Assessed/Time First Assessed: 05/20/23 1412   Present on Original Admission: Yes  Wound Approximate Age at First Assessment (Weeks): 2 weeks  Primary Wound Type: (c) Venous Ulcer  Location: Pretibial  Wound Location Orientation: Right;Lower...   Wound Image     Wound Etiology Surgical   Dressing Status Intact   Wound Cleansed Cleansed with saline   Dressing/Treatment Hydrofera blue   Wound Length (cm) 2 cm   Wound Width (cm) 1.9 cm   Wound Depth (cm) 0.1 cm   Wound Surface Area (cm^2) 3.8 cm^2   Change in Wound Size % (l*w) 51.28   Wound Volume (cm^3) 0.38 cm^3   Wound Healing % 76   Post-Procedure Length (cm) 2.1 cm   Post-Procedure Width (cm) 2 cm   Post-Procedure Depth (cm) 0.1 cm   Post-Procedure Surface Area (cm^2) 4.2 cm^2   Post-Procedure Volume (cm^3) 0.42 cm^3   Wound Assessment Granulation tissue   Drainage Amount Moderate (25-50%)   Drainage Description Serosanguinous   Odor None   Peri-wound Assessment Intact   Wound Thickness Description not for Pressure Injury Full thickness   Pain Assessment   Pain Assessment None - Denies Pain

## 2023-07-04 ENCOUNTER — Ambulatory Visit: Admit: 2023-07-04 | Discharge: 2023-07-04 | Payer: MEDICARE | Attending: Neurology | Primary: Family Medicine

## 2023-07-04 VITALS — BP 152/92 | HR 61 | Wt 292.0 lb

## 2023-07-04 DIAGNOSIS — G629 Polyneuropathy, unspecified: Secondary | ICD-10-CM

## 2023-07-04 NOTE — Progress Notes (Addendum)
 [facility] FOLLOW-UP NOTE    Patient: Chad Rosario  Physician: Sedalia Dacosta, MD    CC:   Chief Complaint   Patient presents with    Follow-up     Neuropathy follow up, pt reports that he has gotten the swelling down in his legs but now the bottoms of his feet are more sensitive.        PCP: Alvie Jolly, MD  Referring Provider: No ref. provider found    History of Present Illness:     Interval History on 07/04/2023:  Chad Rosario is a 68 y.o. right-handed male who presents for follow-up management of peripheral polyneuropathy and neuropathic pain.  Patient reports he is doing about the same.  No significant worsening.  He was recently diagnosed with atrial fibrillation, underwent ablation and was started on anticoagulation.  He has been experiencing swelling in the lower extremities for which he wears compression stockings and  it helps.  He is back on gabapentin 600 mg 4 times a day.  He had more side effects with that medication so he prefers to stay on this medicine.  He has no other questions or concerns.      Interval History on 10/16/2022:  Chad Rosario is a 68 y.o. 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  He is overall doing well.  He reports subjectively fluctuating numbness and imbalance.  Reports side effects with current dose of Gabapentin 600mg  QID, he is very fatigued. Works with PT. No falls, uses cane for security to prevent falling down.   Previous workup was unrevealing in terms of etiology of his polyneuropathy.  B12 was borderline at 295 in 2019, we discussed repeating it today.   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  This findings correlate with patient's clinical picture.  Discussed with patient patient can take to prevent further damage.  Discussed prognosis for his neuropathy.        Original HPI 05/21/21  68 year old married  right-handed white male with many problems.  He is sent for gait disorder weakness of his legs and feet bilateral foot drop polyneuropathy.  He was seen over the years and finally was seen by neuropathy evaluator Dr. Gregory Leash at [facility].     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     He notes that his dad actually had weakness onset his last 2 years of life in his 64s mid 66s to late 28s of severe weakness at his feet to the point of needing a wheelchair.  His parents are succumbed by this time and there is no other family history of such.  Apparently his dad had pes planus.     Patient states that he has more of a pes cavus.  Idiopathic polyneuropathy = = with stumbling and falling he does have a placard for his car he does have a cane that he did not bring with him as he comes unaccompanied today.  He notes that he always has to have the lights on at night has a moderate degree to a severe degree of gait disturbance and falling but no incontinence.                      right handed 68 y.o.    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       Review of Systems:   All systems were reviewed and relevant findings are addressed within the documentation.   Neurological review of systems per HPI.     Lab/Imaging Review:   I REVIEWED PERTINENT LABS, IMAGES, AND REPORTS WITH THE PATIENT PERSONALLY, DIRECTLY AND FULLY. THE MOST PERTINENT FINDINGS ARE NOTED BELOW:    MRI Result (most recent):  MRI KNEE LEFT WO CONTRAST 01/18/2022    Narrative  ---------------------------------------------    EXAMINATION: MRI KNEE LEFT WITHOUT CONTRAST 01/18/2022 7:45 AM EDT    ACCESSION NUMBER: [id number]    COMPARISON: Radiographs performed January 16, 2022.    INDICATION: Left  knee pain x3 weeks    TECHNIQUE: Dedicated MRI of the left knee was performed with multiplanar multiecho technique  Technical note: Evaluation is degraded by patient motion.    FINDINGS:    MEDIAL MENISCUS: High-grade radial type tear of the posterior root attachment with mild extrusion of the body segment.  LATERAL MENISCUS:  Intact.    ACL:  Intact.  PCL:  Intact.  MCL:  Intact.  LATERAL LIGAMENTS AND TENDONS:  Intact.    EXTENSOR MECHANISM:  The quadriceps and patellar tendons are intact.    FAT PADS:   Postsurgical scarring within Hoffa's fat.    CARTILAGE:  Patellofemoral compartment: Mild surface irregularity. Suggestion of partial-thickness chondral fissuring within the inferior aspect of the patellar apex.  Medial compartment: Mild diffuse chondral thinning and surface irregularity. Focus of full-thickness chondral loss at the mid weightbearing surface of the medial femoral condyle with subtle subchondral marrow reactive edema.  Lateral compartment: Intact.    BONE MARROW: Partially imaged intramedullary rod within the tibia with proximal fixation screw. No fracture or marrow replacing process.    OTHER: Small joint effusion. Mild anterior predominant subcutaneous soft tissue edema. Suggestion of mild fatty infiltration of the semimembranosus muscle.    Impression  IMPRESSION:    1.  High-grade radial type tear of the posterior root attachment medial meniscus with mild extrusion of the body segment.  2.  Mild osteoarthritic changes, as above, with a focus of full-thickness chondral fissuring within the medial femoral condyle.  3.  Cruciate and collateral ligaments are intact.  4.  Small joint effusion.     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 Place the patient's;indicated arm neutral to his/her side wi    COMPARISON: Radiograph 10/30/2022    TECHNIQUE: Contiguous axial images of the right shoulder were obtained without intravenous contrast administration.      FINDINGS:    Osseous structures: Advanced glenohumeral joint degenerative changes. Asymmetric posterior glenoid wear resulting in retroversion greater than 15 degrees. There is mild posterior subluxation of the humeral head. Findings are most compatible with B3 morphology. No evidence of large glenohumeral joint effusion. Mild degenerative changes are present at the [facility] joint with a questionable prior subacromial decompression. No acute fracture identified. No aggressive osseous lesion.    Soft tissues:  The visualized right lung is clear. No evidence of significant  abnormality in the soft tissues.    Impression  Advanced glenohumeral joint degenerative changes with glenoid retroversion/posterior erosion as well as posterior subluxation of the humeral head most compatible with B3 morphology.    Signed by: 11/01/2022 10:41 AM: Neal Baldy     Cardiology (most recent):  05/21/23    TEE (PRN CONTRAST/BUBBLE/3D) 05/22/2023  5:19 PM (Final)    Interpretation Summary    Left Ventricle: Reduced left ventricular systolic function with a visually estimated EF of 45 - 50%. Left ventricle size is normal. Normal wall thickness. Mild global hypokinesis present.    Aortic Valve: Mild sclerosis of the aortic valve cusps.    Mitral Valve: Mild regurgitation.    Tricuspid Valve: Mild regurgitation.    Left Atrium: Left atrium is moderately dilated. No left atrial appendage thrombus noted.    Right Atrium: Right atrium is moderately dilated.    Image quality is excellent.    Signed by: Nat Badger on 05/22/2023  5:19 PM       Past  Medical History:   Past medical history, surgical history, social history, family history, medications, and allergies were reviewed and updated as appropriate.     PAST MEDICAL HISTORY:  Past Medical History:   Diagnosis Date    CAD (coronary artery disease) 2015    Gout     Hypertension     controlled with meds    Neuropathy May 2019    Osteoarthritis 03/2013    Sleep apnea     cpap at night       PAST SURGICAL HISTORY:   Past Surgical History:   Procedure Laterality Date    BACK SURGERY  2021    has had 3 different ones last was in 2021    CARDIAC SURGERY      CABG    CHOLECYSTECTOMY      COLONOSCOPY N/A 11/29/2020    COLONOSCOPY POLYPECTOMY HOT SNARE performed by Margurette Shillings, MD at [facility] ENDOSCOPY    EP DEVICE PROCEDURE N/A 05/22/2023    Ablation A-fib w complete ep study performed by Nat Badger, MD at [facility] CARDIAC CATH LAB    EYE SURGERY Left     cataract    FRACTURE SURGERY Left     tib-fib fracture    JOINT REPLACEMENT  Hip - 2018    KNEE ARTHROSCOPY Right     TOTAL HIP ARTHROPLASTY Left        FAMILY HISTORY:  Family History   Problem Relation Age of Onset    Cancer Mother         Lymp    Stroke Father     Cancer Sister         Breast    Clotting Disorder Maternal Grandfather         SOCIAL HISTORY:  Social History     Socioeconomic History    Marital status: Married   Tobacco Use    Smoking status: Never    Smokeless tobacco: Never   Vaping Use    Vaping status: Never Used   Substance and Sexual Activity    Alcohol use: Yes     Alcohol/week: 12.0 standard drinks of alcohol     Types: 6 Cans of beer, 6 Shots of liquor per week     Comment: socially    Drug use: Never    Sexual activity: Not Currently     Partners: Female     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  Resource Strain: Low Risk  (04/09/2022)    Received from [facility], [facility]    Overall Financial Resource Strain (CARDIA)     Difficulty of Paying Living Expenses: Not hard at all   Food Insecurity: No Food Insecurity (05/21/2023)     Hunger Vital Sign     Worried About Running Out of Food in the Last Year: Never true     Ran Out of Food in the Last Year: Never true   Transportation Needs: No Transportation Needs (05/21/2023)    PRAPARE - Therapist, art (Medical): No     Lack of Transportation (Non-Medical): No   Physical Activity: Insufficiently Active (07/19/2022)    Exercise Vital Sign     Days of Exercise per Week: 2 days     Minutes of Exercise per Session: 30 min   Stress: No Stress Concern Present (04/09/2022)    Received from [facility], [facility] of Occupational Health - Occupational Stress Questionnaire     Feeling of Stress : Not at all   Social Connections: Socially Integrated (04/09/2022)    Received from [facility]    Social Network     How would you rate your social network (family, work, friends)?: Good participation with social networks   Intimate Partner Violence: Not At Risk (04/09/2022)    Received from [facility], [facility]    HITS     Over the last 12 months how often did your partner physically hurt you?: Never     Over the last 12 months how often did your partner insult you or talk down to you?: Never     Over the last 12 months how often did your partner threaten you with physical harm?: Never     Over the last 12 months how often did your partner scream or curse at you?: Never   Housing Stability: Low Risk  (05/21/2023)    Housing Stability Vital Sign     Unable to Pay for Housing in the Last Year: No     Number of Times Moved in the Last Year: 0     Homeless in the Last Year: No         Medications/Allergies:   MEDICATIONS:   Outpatient Encounter Medications as of 07/04/2023   Medication Sig Dispense Refill    minocycline (MINOCIN;DYNACIN) 100 MG capsule Take 1 capsule by mouth 2 times daily      metoprolol succinate (TOPROL XL) 25 MG extended release tablet Take 1 tablet by mouth daily 90 tablet 3    azelastine (ASTEPRO) 137 MCG/SPRAY nasal spray  USE 1 TO 2 SPRAYS IN EACH NOSTRIL TWICE DAILY AS NEEDED AS DIRECTED      zolpidem (AMBIEN) 5 MG tablet Take 1 tablet by mouth nightly as needed for Sleep for up to 90 days. Max Daily Amount: 5 mg 30 tablet 2    lisinopril (PRINIVIL;ZESTRIL) 20 MG tablet Take 2 tablets by mouth daily 180 tablet 1    furosemide (LASIX) 40 MG tablet Take 1 tablet by mouth daily as needed (take one tablet daily for weight garin greater than 2 lbs in 24 hours or 4 lbs in 48 hours.) 30 tablet 3    Cyanocobalamin (VITAMIN B 12 PO) Take by mouth every morning      allopurinol (ZYLOPRIM) 300 MG tablet TAKE 1 TABLET BY MOUTH DAILY 90 tablet 3    gabapentin (NEURONTIN) 600 MG tablet  Take 1 tablet by mouth in the morning, at noon, in the evening, and at bedtime. 360 tablet 3    Multiple Vitamins-Minerals (THERAPEUTIC MULTIVITAMIN-MINERALS) tablet Take 1 tablet by mouth daily      vitamin D3 (CHOLECALCIFEROL) 10 MCG (400 UNIT) TABS tablet Take 2 tablets by mouth daily      CPAP Machine MISC by Does not apply route      colchicine (MITIGARE) 0.6 MG capsule Take 1 capsule by mouth as needed for Pain 30 capsule 3    fexofenadine (ALLEGRA) 180 MG tablet Take 1 tablet by mouth daily      rivaroxaban (XARELTO) 20 MG TABS tablet Take 1 tablet by mouth daily (with breakfast) (Patient not taking: Reported on 07/04/2023) 30 tablet 11    doxycycline hyclate (VIBRAMYCIN) 100 MG capsule Take 1 capsule by mouth 2 times daily (Patient not taking: Reported on 07/04/2023)       No facility-administered encounter medications on file as of 07/04/2023.       ALLERGIES:  Allergies   Allergen Reactions    Hydrocodone-Acetaminophen Anxiety and Itching    Losartan Shortness Of Breath    Amlodipine Other (See Comments)     Other reaction(s): Other (See Comments)  Fatigue   Fatigue       Rosuvastatin Other (See Comments)     Other reaction(s): Constipation  Fatigue, constipation  Other reaction(s): Fatigue    Atorvastatin Diarrhea    Ezetimibe      Other reaction(s):  Constipation, Other (See Comments)  Constipation and decreased urine output  Constipation and decreased urine output  Other reaction(s): Constipation, Urine output low         Physical Exam:   BP (!) 152/92 (BP Site: Right Upper Arm, Patient Position: Sitting)   Pulse 61   Wt 132.5 kg (292 lb)   SpO2 96%   BMI 38.52 kg/m     Physical and Neurological Exam  General: no acute distress, cooperative  HEENT: normocephalic, anicteric sclerae, MMM  Cardiovascular: RRR  Respiratory: good air entry b/l, unlabored breathing  Gastrointestinal: non-distended   Extremities: no peripheral edema   Skin: warm, dry, no rash    Mental Status: AOx4, normal affect, following commands consistently, language is normal (both comprehension and repetition)  Language: Comprehension intact, no notable hearing deficit, expression is fluent.  Speech:   Cranial Nerves: visual fields full, eye movements are intact  light touch in V1-3 distributions is intact and symmetric  facial muscles of expression symmetric  hearing grossly intact bilaterally  uvula is midline, symmetric palate elevation, SCM and trap strength symmetric  Motor: normal bulk and tone, strength in LUE 5/5, RUE 5/5, dorsiflexion weakness bilaterally 3 out of 5  Sensory: length-dependent decrease in light touch/pinprick and temperature sensation from bilateral toes to knees, vibration sense is reduced in both toes  Reflexes: Absent ankles and knees  Coordination: FTN and HTS without dysmetria, Romberg negative   Gait: Cautious, ambulates with cane    Assessment and Plan:   Chad Rosario is a 68 y.o. male who presents with the following concerns:     Cassell was seen today for follow-up.    Diagnoses and all orders for this visit:    Polyneuropathy    Neuropathic pain    Medication management      Patient is doing about the same.  No significant worsening of his neuropathy.  Neuropathic pain is generally well-controlled with gabapentin 600 mg 4 times daily.  At this point  patient can continue to follow-up with his primary care doctor.  He can come back to the office on an as-needed basis.    Return if symptoms worsen or fail to improve.    Sedalia Dacosta, MD   General Neurology   [facility]  [address], [address]  [place] [address]  Phone: [phone]    All medications and relevant precautions were fully reviewed with the patient. More than 50% of this visit was used to evaluate, educate and coordinate care for the patient for the above concerns. Total visit time: 35 minutes. Time includes pre- and post- face-to-face time, including record review of available pertinent images and reports. I have written all aspects of this note, parts of which were produced using speech recognition software, which may contain inadvertent errors in the produced words.

## 2023-07-09 ENCOUNTER — Inpatient Hospital Stay: Admit: 2023-07-09 | Discharge: 2023-07-09 | Payer: MEDICARE | Primary: Family Medicine

## 2023-07-09 VITALS — BP 170/93 | HR 61 | Temp 98.20000°F | Resp 16 | Ht 73.0 in | Wt 288.0 lb

## 2023-07-09 DIAGNOSIS — I87311 Chronic venous hypertension (idiopathic) with ulcer of right lower extremity: Principal | ICD-10-CM

## 2023-07-09 DIAGNOSIS — T8131XD Disruption of external operation (surgical) wound, not elsewhere classified, subsequent encounter: Secondary | ICD-10-CM

## 2023-07-09 MED ORDER — LIDOCAINE HCL URETHRAL/MUCOSAL 2 % EX GEL
2 | CUTANEOUS | Status: AC | PRN
Start: 2023-07-09 — End: ?
  Administered 2023-07-09: 14:00:00 via TOPICAL

## 2023-07-09 MED ORDER — VASHE WOUND THERAPY EX SOLN
CUTANEOUS | Status: AC | PRN
Start: 2023-07-09 — End: ?
  Administered 2023-07-09: 14:00:00 via TOPICAL

## 2023-07-09 NOTE — Assessment & Plan Note (Signed)
 Assessment  Wound continues to improve - wound bed is red granulation with minimal slough, no localized edema or periwound erythema to suggest infection  Edema is improved with multilayer compression  Patient will be leaving for a cruise in 1 week and he will be gone for 2  Plan  Excisional debridement remove devitalized tissue and promote wound healing  Wound care: Vashe cleanse, Hydrofera Blue; remove multilayer compression in 1 week and transition to compression stockings while on cruise  RTC after cruise

## 2023-07-09 NOTE — Discharge Instructions (Signed)
 NAME:  Tito Ausmus [facility]  DATE OF BIRTH:  [birth year]  MEDICAL RECORD NUMBER:  [id number]  DATE:  07/09/2023     Return Appointment:   follow up after your cruise with Orlie Pollen, NP     Instructions: Right lower leg:  Cleanse with Vashe (may purchase at [facility]) let sit on wound for at least one minute  Apply oil emulsion dressing  Cover with Hydrofera Ready: Cut to wound size, place in wound bed, shiny side out.    Cover with ABD  2 layer compression with wound and lower extremity assessment.  If there is any problem with the dressing (too tight, slides down, etc.) Patient to return to wound clinic to have re-wrapped by clinician.   May remove after one week, replace dressing and wear your compression stockings daily. (Once 2 layer wrap is removed, change dressing 3x weekly)  Change dressing weekly        Elevate legs when sitting.  Avoid prolonged standing or sitting with legs in dependent position.     Elevate legs while sitting  Eat minimum of 100 grams of protein per day  Protein:   Egg ~ 6g  Yogurt ~ 15g  Half cup of cottage cheese ~ 15g  Chicken breast ~ 30g  Salmon filet ~ 40g

## 2023-07-09 NOTE — Progress Notes (Signed)
 [place] [facility]  Procedure Note    Chad Rosario [facility]  MEDICAL RECORD NUMBER: [id number]  AGE: 68 y.o.     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, right (HCC) (Chronic)    Assessment  Wound continues to improve - wound bed is red granulation with minimal slough, no localized edema or periwound erythema to suggest infection  Edema is improved with multilayer compression  Patient will be leaving for a cruise in 1 week and he will be gone for 2  Plan  Excisional debridement remove devitalized tissue and promote wound healing  Wound care: Vashe cleanse, Hydrofera Blue; remove multilayer compression in 1 week and transition to compression stockings while on cruise  RTC after cruise         Lower extremity edema     Procedure Note: Excisional Debridement  Indications: Based on my examination of the patient's wound(s) today, debridement is required to remove devitalized tissue, evaluate the wound base, and promote wound healing.  Performed by: Inocente Manifold, APRN - NP  Consent obtained: Yes  Time out taken: Yes  Pain control:     Wound #: 1  Diabetic/pressure/chronic non-pressure ulcers only: non-pressure ulcer, fat layer exposed was/were debrided using curette. The wound(s) was/were debrided down through/to subcutaneous tissue. Devitalized tissue debrided: biofilm, slough, and exudate. Pre and post debridement measurements: see flow sheet.    Puncture 05/22/23 Femoral (Active)   Number of days: 48       Puncture 05/22/23 Femoral (Active)   Number of days: 48       Wound 06/07/21 Knee Right;Posterior (Active)   Number of days: 763       Wound 05/20/23 Pretibial Right;Lower  #1 (Active)   Wound Image    07/09/23 0912   Wound Etiology Surgical 07/09/23 0912   Dressing Status Intact 07/02/23 0806   Wound Cleansed Vashe 07/09/23 0912   Dressing/Treatment Hydrofera blue 07/09/23 0912   Wound Length (cm) 2 cm 07/09/23 0912   Wound Width (cm) 1.9 cm 07/09/23 0912   Wound Depth (cm) 0.1 cm 07/09/23 0912   Wound Surface Area (cm^2) 3.8 cm^2 07/09/23 0912   Change in Wound Size % (l*w) 51.28 07/09/23 0912   Wound Volume (cm^3) 0.38 cm^3 07/09/23 0912   Wound Healing % 76 07/09/23 0912   Post-Procedure Length (cm) 2 cm 07/09/23 0912   Post-Procedure Width (cm) 1.9 cm 07/09/23 0912   Post-Procedure Depth (cm) 0.2 cm 07/09/23 0912   Post-Procedure Surface Area (cm^2) 3.8 cm^2 07/09/23 0912   Post-Procedure Volume (cm^3) 0.76 cm^3 07/09/23 0912   Wound Assessment Granulation tissue 07/09/23 0912   Drainage Amount Moderate (25-50%) 07/09/23 0912   Drainage Description Serosanguinous 07/09/23 0912   Odor None 07/02/23 0806   Peri-wound Assessment Intact 07/02/23 0806   Margins Attached edges 06/10/23 0828   Wound Thickness Description not for Pressure Injury Full thickness 07/09/23 0912   Number of days: 50         Total surface area debrided: 3.8 sq cm   Estimated blood loss: minimal amount blood loss  Hemostasis achieved: pressure  Procedural pain: 0 / 10   Post procedural pain: 0 / 10   Response to treatment: tolerated procedure well with no complaints of pain    This dictation may have been generated in part by voice recognition computer software. Although all attempts are made to edit the dictation for accuracy, there may be errors in the transcription that are not intended.

## 2023-07-09 NOTE — Wound Image (Signed)
 Discharge Instructions for  [facility]  [address]  [address]  [place], [place] [address]  Phone [phone]   Fax [phone]      NAME:  Chad Rosario  DATE OF BIRTH:  12-18-1955  MEDICAL RECORD NUMBER:  [id number]  DATE:  07/09/2023    Return Appointment:   follow up after your cruise with Neysa Bares, NP    Instructions: Right lower leg:  Cleanse with Vashe (may purchase at [facility]) let sit on wound for at least one minute  Apply oil emulsion dressing  Cover with Hydrofera Ready: Cut to wound size, place in wound bed, shiny side out.    Cover with ABD  2 layer compression with wound and lower extremity assessment.  If there is any problem with the dressing (too tight, slides down, etc.) Patient to return to wound clinic to have re-wrapped by clinician.   May remove after one week, replace dressing and wear your compression stockings daily. (Once 2 layer wrap is removed, change dressing 3x weekly)  Change dressing weekly      Elevate legs when sitting.  Avoid prolonged standing or sitting with legs in dependent position.     Elevate legs while sitting  Eat minimum of 100 grams of protein per day  Protein:   Egg ~ 6g  Yogurt ~ 15g  Half cup of cottage cheese ~ 15g  Chicken breast ~ 30g  Salmon filet ~ 40g     Should you experience increased redness, swelling, pain, foul odor, size of wound(s), or have a temperature over 101 degrees please contact the Wound Healing Center at [phone] or if after hours contact your primary care physician or go to the hospital emergency department.    PLEASE NOTE: IF YOU ARE UNABLE TO OBTAIN WOUND SUPPLIES, CONTINUE TO USE THE SUPPLIES YOU HAVE AVAILABLE UNTIL YOU ARE ABLE TO REACH US . IT IS MOST IMPORTANT TO KEEP THE WOUND COVERED AT ALL TIMES.    Electronically signed Dodson Freestone, RN, [facility] on 07/09/2023 at 9:26 AM

## 2023-07-09 NOTE — Wound Image (Signed)
 Multilayer Compression Wrap   (Not Unna) Below the Knee    NAME:  Chad Rosario  DATE OF BIRTH:  1955/11/28  MEDICAL RECORD NUMBER:  [id number]  DATE:  07/09/2023    Multilayer compression wrap: Applied primary and secondary dressing as ordered.  Applied multilayered dressing below the knee to right lower leg.  Instructed patient/caregiver not to remove dressing and to keep it clean and dry.   Instructed patient/caregiver on complications to report to provider, such as pain, numbness in toes, heavy drainage, and slippage of dressing.  Instructed patient on purpose of compression dressing and on activity and exercise recommendations.      Electronically signed by Dodson Freestone, RN on 07/09/2023 at 9:41 AM

## 2023-07-09 NOTE — Other (Signed)
 07/09/23 0912   Right Leg Edema Point of Measurement   Leg circumference 43.5 cm   Ankle circumference 26.5 cm   Foot circumference 26 cm   Compression Therapy Compression stockings   Wound 05/20/23 Pretibial Right;Lower #1   Date First Assessed/Time First Assessed: 05/20/23 1412   Present on Original Admission: Yes  Wound Approximate Age at First Assessment (Weeks): 2 weeks  Primary Wound Type: (c) Venous Ulcer  Location: Pretibial  Wound Location Orientation: Right;Lower...   Wound Image     Wound Etiology Surgical   Wound Cleansed Vashe   Dressing/Treatment Hydrofera blue   Wound Length (cm) 2 cm   Wound Width (cm) 1.9 cm   Wound Depth (cm) 0.1 cm   Wound Surface Area (cm^2) 3.8 cm^2   Change in Wound Size % (l*w) 51.28   Wound Volume (cm^3) 0.38 cm^3   Wound Healing % 76   Post-Procedure Length (cm) 2 cm   Post-Procedure Width (cm) 1.9 cm   Post-Procedure Depth (cm) 0.2 cm   Post-Procedure Surface Area (cm^2) 3.8 cm^2   Post-Procedure Volume (cm^3) 0.76 cm^3   Wound Assessment Granulation tissue   Drainage Amount Moderate (25-50%)   Drainage Description Serosanguinous   Wound Thickness Description not for Pressure Injury Full thickness

## 2023-07-30 ENCOUNTER — Encounter: Payer: MEDICARE | Attending: Family Medicine | Primary: Family Medicine

## 2023-08-05 ENCOUNTER — Inpatient Hospital Stay: Admit: 2023-08-05 | Discharge: 2023-08-05 | Payer: MEDICARE | Primary: Family Medicine

## 2023-08-05 VITALS — BP 145/76 | HR 69 | Temp 98.50000°F | Resp 16 | Ht 73.0 in | Wt 272.0 lb

## 2023-08-05 DIAGNOSIS — L97812 Non-pressure chronic ulcer of other part of right lower leg with fat layer exposed: Secondary | ICD-10-CM

## 2023-08-05 DIAGNOSIS — T8131XD Disruption of external operation (surgical) wound, not elsewhere classified, subsequent encounter: Secondary | ICD-10-CM

## 2023-08-05 MED ORDER — LIDOCAINE HCL URETHRAL/MUCOSAL 2 % EX GEL
2 % | CUTANEOUS | Status: DC | PRN
Start: 2023-08-05 — End: 2023-08-06
  Administered 2023-08-05: 14:00:00 via TOPICAL

## 2023-08-05 NOTE — Assessment & Plan Note (Signed)
 Assessment  Wound is smaller today - wound bed is red granulation with minimal slough, no localized edema or periwound erythema to suggest infection  Edema is minimal despite patient not wearing compression while on vacation  Plan  Excisional debridement remove devitalized tissue and promote wound healing  Wound care: Vashe cleanse, Hydrofera Blue, OTC compression stockings  RTC 2 weeks

## 2023-08-05 NOTE — Wound Image (Signed)
 Discharge Instructions for  [facility]  [address]  [address]  [place], [place] [address]  Phone [phone]   Fax [phone]      NAME:  Chad Rosario  DATE OF BIRTH:  [birth year]  MEDICAL RECORD NUMBER:  [id number]  DATE:  08/05/2023    Return Appointment:   2 weeks with Neysa Bares, NP    Instructions: Right lower leg:  Cleanse with Vashe (may purchase at [facility]) let sit on wound for at least one minute  Cover with Hydrofera Ready: Cut to wound size, place in wound bed, shiny side out.    Cover with Adhesive bandage  Wear Compressions socks during the day.    Elevate legs when sitting.  Avoid prolonged standing or sitting with legs in dependent position.     Elevate legs while sitting  Eat minimum of 100 grams of protein per day  Protein:   Egg ~ 6g  Yogurt ~ 15g  Half cup of cottage cheese ~ 15g  Chicken breast ~ 30g  Salmon filet ~ 40g     Should you experience increased redness, swelling, pain, foul odor, size of wound(s), or have a temperature over 101 degrees please contact the Wound Healing Center at [phone] or if after hours contact your primary care physician or go to the hospital emergency department.    PLEASE NOTE: IF YOU ARE UNABLE TO OBTAIN WOUND SUPPLIES, CONTINUE TO USE THE SUPPLIES YOU HAVE AVAILABLE UNTIL YOU ARE ABLE TO REACH US . IT IS MOST IMPORTANT TO KEEP THE WOUND COVERED AT ALL TIMES.    Electronically signed Julietta Ogren, RN, [facility] on 08/05/2023 at 9:37 AM

## 2023-08-05 NOTE — Other (Signed)
 08/05/23 0913   Right Leg Edema Point of Measurement   Leg circumference 40 cm   Ankle circumference 26 cm   Foot circumference 26 cm   Compression Therapy Compression ordered   Wound 05/20/23 Pretibial Right;Lower #1   Date First Assessed/Time First Assessed: 05/20/23 1412   Present on Original Admission: Yes  Wound Approximate Age at First Assessment (Weeks): 2 weeks  Primary Wound Type: (c) Venous Ulcer  Location: Pretibial  Wound Location Orientation: Right;Lower...   Wound Image     Wound Etiology Surgical   Dressing Status Old drainage noted   Wound Cleansed Cleansed with saline   Dressing/Treatment Hydrofera blue   Wound Length (cm) 1.5 cm   Wound Width (cm) 1.5 cm   Wound Depth (cm) 0.1 cm   Wound Surface Area (cm^2) 2.25 cm^2   Change in Wound Size % (l*w) 71.15   Wound Volume (cm^3) 0.225 cm^3   Wound Healing % 86   Post-Procedure Length (cm) 1.6 cm   Post-Procedure Width (cm) 1.6 cm   Post-Procedure Depth (cm) 0.1 cm   Post-Procedure Surface Area (cm^2) 2.56 cm^2   Post-Procedure Volume (cm^3) 0.256 cm^3   Wound Assessment Granulation tissue   Drainage Amount Small (< 25%)   Drainage Description Serosanguinous   Odor None   Peri-wound Assessment Blanchable erythema   Wound Thickness Description not for Pressure Injury Full thickness   Pain Assessment   Pain Assessment None - Denies Pain

## 2023-08-05 NOTE — Progress Notes (Signed)
 [place] [facility] [facility]  Procedure Note    Chad Rosario [facility]  MEDICAL RECORD NUMBER: [id number]  AGE: 68 y.o.     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, right (HCC) (Chronic)    Assessment  Wound is smaller today - wound bed is red granulation with minimal slough, no localized edema or periwound erythema to suggest infection  Edema is minimal despite patient not wearing compression while on vacation  Plan  Excisional debridement remove devitalized tissue and promote wound healing  Wound care: Vashe cleanse, Hydrofera Blue, OTC compression stockings  RTC 2 weeks          Procedure Note: Excisional Debridement  Indications: Based on my examination of the patient's wound(s) today, debridement is required to remove devitalized tissue, evaluate the wound base, and promote wound healing.  Performed by: Inocente Manifold, APRN - NP  Consent obtained: Yes  Time out taken: Yes  Pain control:     Wound #: 1  Diabetic/pressure/chronic non-pressure ulcers only: non-pressure ulcer, fat layer exposed was/were debrided using curette. The wound(s) was/were debrided down through/to subcutaneous tissue. Devitalized tissue debrided: biofilm, slough, and exudate. Pre and post debridement measurements: see flow sheet.    Puncture 05/22/23 Femoral (Active)   Number of days: 75       Puncture 05/22/23 Femoral (Active)   Number of days: 75       Wound 06/07/21 Knee Right;Posterior (Active)   Number of days: 790       Wound 05/20/23 Pretibial Right;Lower #1 (Active)   Wound Image    08/05/23 0913   Wound Etiology Surgical 08/05/23 0913   Dressing Status Old drainage noted 08/05/23 0913   Wound Cleansed Cleansed with saline 08/05/23 0913   Dressing/Treatment Hydrofera blue 08/05/23 0913   Wound Length (cm)  1.5 cm 08/05/23 0913   Wound Width (cm) 1.5 cm 08/05/23 0913   Wound Depth (cm) 0.1 cm 08/05/23 0913   Wound Surface Area (cm^2) 2.25 cm^2 08/05/23 0913   Change in Wound Size % (l*w) 71.15 08/05/23 0913   Wound Volume (cm^3) 0.225 cm^3 08/05/23 0913   Wound Healing % 86 08/05/23 0913   Post-Procedure Length (cm) 1.6 cm 08/05/23 0913   Post-Procedure Width (cm) 1.6 cm 08/05/23 0913   Post-Procedure Depth (cm) 0.1 cm 08/05/23 0913   Post-Procedure Surface Area (cm^2) 2.56 cm^2 08/05/23 0913   Post-Procedure Volume (cm^3) 0.256 cm^3 08/05/23 0913   Wound Assessment Granulation tissue 08/05/23 0913   Drainage Amount Small (< 25%) 08/05/23 0913   Drainage Description Serosanguinous 08/05/23 0913   Odor None 08/05/23 0913   Peri-wound Assessment Blanchable erythema 08/05/23 0913   Margins Attached edges 06/10/23 4098   Wound Thickness Description not for Pressure Injury Full thickness 08/05/23 0913   Number of days: 77         Total surface area debrided: 2.56 sq cm   Estimated blood loss: minimal amount blood loss  Hemostasis achieved: pressure  Procedural pain: 0 / 10   Post procedural pain: 0 / 10   Response to treatment: tolerated procedure well with no complaints of pain    This dictation may have been generated in part by voice recognition  Production manager. Although all attempts are made to edit the dictation for accuracy, there may be errors in the transcription that are not intended.

## 2023-08-08 ENCOUNTER — Ambulatory Visit: Admit: 2023-08-08 | Discharge: 2023-08-08 | Payer: MEDICARE | Attending: Family Medicine | Primary: Family Medicine

## 2023-08-08 VITALS — BP 140/80 | HR 94 | Temp 97.90000°F | Ht 73.0 in | Wt 283.0 lb

## 2023-08-08 DIAGNOSIS — Z0000 Encounter for general adult medical examination without abnormal findings: Secondary | ICD-10-CM

## 2023-08-08 LAB — CBC WITH AUTO DIFFERENTIAL
Basophils %: 0.4 % (ref 0.0–2.0)
Basophils Absolute: 0.03 10*3/uL (ref 0.00–0.20)
Eosinophils %: 5.5 % (ref 0.5–7.8)
Eosinophils Absolute: 0.44 10*3/uL (ref 0.00–0.80)
Hematocrit: 47.5 % (ref 41.1–50.3)
Hemoglobin: 17.4 g/dL — ABNORMAL HIGH (ref 13.6–17.2)
Immature Granulocytes %: 0.4 % (ref 0.0–5.0)
Immature Granulocytes Absolute: 0.03 10*3/uL (ref 0.0–0.5)
Lymphocytes %: 14.7 % (ref 13.0–44.0)
Lymphocytes Absolute: 1.18 10*3/uL (ref 0.50–4.60)
MCH: 32.1 pg (ref 26.1–32.9)
MCHC: 36.6 g/dL — ABNORMAL HIGH (ref 31.4–35.0)
MCV: 87.6 FL (ref 82–102)
MPV: 13 FL — ABNORMAL HIGH (ref 9.4–12.3)
Monocytes %: 7.5 % (ref 4.0–12.0)
Monocytes Absolute: 0.6 10*3/uL (ref 0.10–1.30)
Neutrophils %: 71.5 % (ref 43.0–78.0)
Neutrophils Absolute: 5.75 10*3/uL (ref 1.70–8.20)
Platelets: 53 10*3/uL — AB (ref 150–450)
RBC: 5.42 M/uL (ref 4.23–5.6)
RDW: 17 % — ABNORMAL HIGH (ref 11.9–14.6)
WBC: 8 10*3/uL (ref 4.3–11.1)
nRBC: 0 10*3/uL (ref 0.0–0.2)

## 2023-08-08 LAB — COMPREHENSIVE METABOLIC PANEL
ALT: 52 U/L (ref 8–55)
AST: 45 U/L — ABNORMAL HIGH (ref 15–37)
Albumin/Globulin Ratio: 1.1 (ref 1.0–1.9)
Albumin: 3.5 g/dL (ref 3.2–4.6)
Alk Phosphatase: 64 U/L (ref 40–129)
Anion Gap: 9 mmol/L (ref 7–16)
BUN: 19 mg/dL (ref 8–23)
CO2: 26 mmol/L (ref 20–29)
Calcium: 9.2 mg/dL (ref 8.8–10.2)
Chloride: 102 mmol/L (ref 98–107)
Creatinine: 1.08 mg/dL (ref 0.80–1.30)
Est, Glom Filt Rate: 75 mL/min/{1.73_m2} (ref >60)
Globulin: 3.2 g/dL (ref 2.3–3.5)
Glucose: 109 mg/dL — ABNORMAL HIGH (ref 70–99)
Potassium: 4.8 mmol/L (ref 3.5–5.1)
Sodium: 136 mmol/L (ref 136–145)
Total Bilirubin: 1.3 mg/dL — ABNORMAL HIGH (ref 0.0–1.2)
Total Protein: 6.7 g/dL (ref 6.3–8.2)

## 2023-08-08 LAB — LIPID PANEL
Chol/HDL Ratio: 3.2 (ref 0.0–5.0)
Cholesterol, Total: 170 mg/dL (ref 0–200)
HDL: 53 mg/dL (ref 40–60)
LDL Cholesterol: 95 mg/dL (ref 0–100)
Triglycerides: 111 mg/dL (ref 0–150)
VLDL Cholesterol Calculated: 22 mg/dL (ref 6–23)

## 2023-08-08 LAB — HEMOGLOBIN A1C
Estimated Avg Glucose: 106 mg/dL
Hemoglobin A1C: 5.3 % (ref 0–5.6)

## 2023-08-08 LAB — MAGNESIUM: Magnesium: 1.8 mg/dL (ref 1.8–2.4)

## 2023-08-08 LAB — URIC ACID: Uric Acid: 5.6 mg/dL (ref 3.9–8.2)

## 2023-08-08 NOTE — Progress Notes (Signed)
 Chad Rosario  07-31-1955 is a 68 y.o. male ,Established patient, here for evaluation of the following chief complaint(s):   Chief Complaint   Patient presents with    Medicare AWV    Numbness     Bilateral feet, getting worse neuropathy    Other     On downside of sickness    Medication Refill     No refills on ambien , just refilled at pharmacy          1. Medicare annual wellness visit, subsequent  2. Paroxysmal atrial fibrillation (HCC)  -     EKG 12 Lead  -     CBC with Auto Differential; Future  -     Comprehensive Metabolic Panel; Future  3. Tachycardia  -     EKG 12 Lead  -     CBC with Auto Differential; Future  -     Comprehensive Metabolic Panel; Future  4. Morbid (severe) obesity due to excess calories (E66.01)  5. Body mass index [BMI] 37.0-37.9, adult (Z68.37)  6. Gout, unspecified cause, unspecified chronicity, unspecified site  -     Uric Acid; Future  7. Abnormal glucose  -     Hemoglobin A1C; Future  8. Mixed hyperlipidemia  -     Lipid Panel; Future  9. Ectopic beats        Return in about 4 weeks (around 09/05/2023).        Subjective   SUBJECTIVE/OBJECTIVE:  Medication Refill  This is a chronic problem. The current episode started in the past 7 days. The problem occurs daily. The problem has been unchanged. Pertinent negatives include no abdominal pain, chest pain, chills, coughing, fever, headaches, joint swelling, myalgias, nausea, rash, sore throat or weakness.   Hypertension  This is a chronic problem. The current episode started more than 1 year ago. The problem has been gradually improving since onset. The problem is resistant. Pertinent negatives include no anxiety, blurred vision, chest pain, headaches, palpitations, peripheral edema, PND or shortness of breath.       Review of Systems   Constitutional:  Negative for chills and fever.   HENT:  Negative for ear pain, hearing loss and sore throat.    Eyes:  Negative for blurred vision, photophobia and pain.   Respiratory:  Negative  for cough and shortness of breath.    Cardiovascular:  Negative for chest pain, palpitations, leg swelling and PND.   Gastrointestinal:  Negative for abdominal distention, abdominal pain, blood in stool and nausea.   Genitourinary:  Negative for dysuria and urgency.   Musculoskeletal:  Negative for joint swelling and myalgias.   Skin:  Negative for color change, pallor and rash.   Neurological:  Negative for speech difficulty, weakness, light-headedness and headaches.   Hematological:  Negative for adenopathy.   Psychiatric/Behavioral:  Negative for agitation, confusion, hallucinations, self-injury and suicidal ideas.           Objective   Physical Exam  Constitutional:       Appearance: Normal appearance.   HENT:      Head: Normocephalic and atraumatic.      Nose: Nose normal.   Eyes:      Extraocular Movements: Extraocular movements intact.      Conjunctiva/sclera: Conjunctivae normal.      Pupils: Pupils are equal, round, and reactive to light.   Cardiovascular:      Rate and Rhythm: Normal rate and regular rhythm.      Pulses: Normal pulses.  Heart sounds: Normal heart sounds.   Pulmonary:      Effort: Pulmonary effort is normal.      Breath sounds: Normal breath sounds.   Abdominal:      General: Abdomen is flat. Bowel sounds are normal.      Palpations: Abdomen is soft.   Skin:     General: Skin is warm and dry.   Neurological:      General: No focal deficit present.      Mental Status: He is alert and oriented to person, place, and time.   Psychiatric:         Mood and Affect: Mood normal.                   An electronic signature was used to authenticate this note.    --Alvie Jolly, MD Medicare Annual Wellness Visit    Madden Garron [facility] is here for Medicare AWV, Numbness (Bilateral feet, getting worse neuropathy), Other (On downside of sickness), and Medication Refill (No refills on ambien , just refilled at pharmacy)    Assessment & Plan   Medicare annual wellness visit, subsequent     No  follow-ups on file.     Subjective       Patient's complete Health Risk Assessment and screening values have been reviewed and are found in Flowsheets. The following problems were reviewed today and where indicated follow up appointments were made and/or referrals ordered.    Positive Risk Factor Screenings with Interventions:       Alcohol Screening:  AUDIT-C Score: (Patient-Rptd) 5  AUDIT Total Score: (Patient-Rptd) 5  Total Score Interpretation: 0-7 suggests low risk alcohol consumption but assess individual risks   Interventions:  Patient declined any further intervention or treatment                Abnormal BMI (obese):  Body mass index is 37.34 kg/m. (!) Abnormal  Interventions:  Patient advised to follow-up in this office for further evaluation and treatment                           Objective   Vitals:    08/08/23 0938   BP: (!) 140/90   Pulse: 94   Temp: 97.9 F (36.6 C)   SpO2: 98%   Weight: 128.4 kg (283 lb)   Height: 1.854 m (6\' 1" )      Body mass index is 37.34 kg/m.                    Allergies   Allergen Reactions    Hydrocodone-Acetaminophen  Anxiety and Itching    Losartan Shortness Of Breath    Amlodipine Other (See Comments)     Other reaction(s): Other (See Comments)  Fatigue   Fatigue       Rosuvastatin Other (See Comments)     Other reaction(s): Constipation  Fatigue, constipation  Other reaction(s): Fatigue    Atorvastatin Diarrhea    Ezetimibe      Other reaction(s): Constipation, Other (See Comments)  Constipation and decreased urine output  Constipation and decreased urine output  Other reaction(s): Constipation, Urine output low       Prior to Visit Medications    Medication Sig Taking? Authorizing Provider   minocycline (MINOCIN;DYNACIN) 100 MG capsule Take 1 capsule by mouth 2 times daily Yes [provider]   metoprolol  succinate (TOPROL  XL) 25 MG extended release tablet Take 1 tablet by mouth daily  Yes Dowdy, Zachary M, MD   azelastine (ASTEPRO) 137 MCG/SPRAY nasal spray USE  1 TO 2 SPRAYS IN EACH NOSTRIL TWICE DAILY AS NEEDED AS DIRECTED Yes [provider]   zolpidem  (AMBIEN ) 5 MG tablet Take 1 tablet by mouth nightly as needed for Sleep for up to 90 days. Max Daily Amount: 5 mg Yes Alvie Jolly, MD   lisinopril  (PRINIVIL ;ZESTRIL ) 20 MG tablet Take 2 tablets by mouth daily Yes de Mazie Speed, MD   rivaroxaban  (XARELTO ) 20 MG TABS tablet Take 1 tablet by mouth daily (with breakfast) Yes Valetti, Kelly K, PA   furosemide  (LASIX ) 40 MG tablet Take 1 tablet by mouth daily as needed (take one tablet daily for weight garin greater than 2 lbs in 24 hours or 4 lbs in 48 hours.) Yes Valetti, Kelly K, PA   doxycycline  hyclate (VIBRAMYCIN ) 100 MG capsule Take 1 capsule by mouth 2 times daily Yes [provider]   Cyanocobalamin (VITAMIN B 12 PO) Take by mouth every morning Yes [provider]   allopurinol  (ZYLOPRIM ) 300 MG tablet TAKE 1 TABLET BY MOUTH DAILY Yes Alvie Jolly, MD   Multiple Vitamins-Minerals (THERAPEUTIC MULTIVITAMIN-MINERALS) tablet Take 1 tablet by mouth daily Yes [provider]   vitamin D3 (CHOLECALCIFEROL ) 10 MCG (400 UNIT) TABS tablet Take 2 tablets by mouth daily Yes [provider]   CPAP Machine MISC by Does not apply route Yes [provider]   colchicine  (MITIGARE ) 0.6 MG capsule Take 1 capsule by mouth as needed for Pain Yes Wanda Gurney, PA-C   fexofenadine (ALLEGRA) 180 MG tablet Take 1 tablet by mouth daily Yes [provider]   gabapentin  (NEURONTIN ) 600 MG tablet Take 1 tablet by mouth in the morning, at noon, in the evening, and at bedtime.  Adelle Hong, MD       CareTeam (Including outside providers/suppliers regularly involved in providing care):   Patient Care Team:  Alvie Jolly, MD as PCP - General (Family Medicine)  Reece Cane Devorah Fonder, MD as PCP - Empaneled Provider  Ali, Zeeshan, MD as Hematology/Oncology (Hematology and Oncology)      Recommendations for Preventive Services Due: see orders and patient instructions/AVS.  Recommended screening schedule for the next 5-10 years is provided to the patient in written form: see Patient Instructions/AVS.     Reviewed and updated this visit:  Allergies  Meds

## 2023-08-08 NOTE — Patient Instructions (Signed)
 9 Ways to Cut Back on Drinking  Maybe you've found yourself drinking more alcohol than you'd prefer. If you want to cut back, here are some ideas to try.    Think before you drink.  Do you really want a drink, or is it just a habit? If you're used to having a drink at a certain time, try doing something else then.     Look for substitutes.  Find some no-alcohol drinks that you enjoy, like flavored seltzer water, tea with honey, or tonic with a slice of lime. Or try alcohol-free beer or "virgin" cocktails (without the alcohol).     Drink more water.  Use water to quench your thirst. Drink a glass of water before you have any alcohol. Have another glass along with every drink or between drinks.     Shrink your drink.  For example, have a bottle of beer instead of a pint. Use a smaller glass for wine. Choose drinks with lower alcohol content (ABV%). Or use less liquor and more mixer in cocktails.     Slow down.  It's easy to drink quickly and without thinking about it. Pay attention, and make each drink last longer.     Do the math.  Total up how much you spend on alcohol each month. How much is that a year? If you cut back, what could you do with the money you save?     Take a break.  Choose a day or two each week when you won't drink at all. Notice how you feel on those days, physically and emotionally. How did you sleep? Do you feel better? Over time, add more break days.     Count calories.  Would you like to lose some weight? For some people that's a good motivator for cutting back. Figure out how many calories are in each drink. How many does that add up to in a day? In a week? In a month?     Practice saying no.  Be ready when someone offers you a drink. Try: "Thanks, I've had enough." Or "Thanks, but I'm cutting back." Or "No, thanks. I feel better when I drink less."   Current as of: November 12, 2022  Content Version: 14.4   2024-2025 [place], [place].   Care instructions adapted under license by  [facility]. If you have questions about a medical condition or this instruction, always ask your healthcare professional. Laren Player, [facility], disclaims any warranty or liability for your use of this information.       Starting a Weight-Loss Plan: Care Instructions  Overview    It can be a challenge to lose weight. But your doctor can help you make a weight-loss plan that meets your needs.  You don't have to make a lot of big changes at once. A better idea might be to focus on small changes and stick with them. When those changes become habit, you can add a few more changes.  Some people find it helpful to take an exercise or nutrition class. If you have questions, ask your doctor about seeing a registered dietitian or an exercise specialist. You might also think about joining a weight-loss support group.  If you're not ready to make changes right now, try to pick a date in the future. Then make an appointment with your doctor to talk about when and how you'll get started with a plan.  Follow-up care is a key part of your treatment and safety. Be sure  to make and go to all appointments, and call your doctor if you are having problems. It's also a good idea to know your test results and keep a list of the medicines you take.  How can you care for yourself as you start a weight-loss plan?  Set realistic goals. Many people expect to lose much more weight than is likely. A weight loss of 5% to 10% of your body weight may be enough to improve your health.  Get family and friends involved to provide support. Talk to them about why you are trying to lose weight, and ask them to help. They can help by participating in exercise and having meals with you, even if they may be eating something different.  Find what works best for you. If you do not have time or do not like to cook, a program that offers meal replacement bars or shakes may be better for you. Or if you like to prepare meals, finding a plan that includes  daily menus and recipes may be best.  Ask your doctor about other health professionals who can help you achieve your weight-loss goals.  A dietitian can help you make healthy changes in your diet.  An exercise specialist or personal trainer can help you develop a safe and effective exercise program.  A counselor or psychiatrist can help you cope with issues such as depression, anxiety, or family problems that can make it hard to focus on weight loss.  Consider joining a support group for people who are trying to lose weight. Your doctor can suggest groups in your area.  Where can you learn more?  Go to [employer] and enter U357 to learn more about "Starting a Weight-Loss Plan: Care Instructions."  Current as of: July 23, 2022  Content Version: 14.4   2024-2025 [place], [place].   Care instructions adapted under license by [facility]. If you have questions about a medical condition or this instruction, always ask your healthcare professional. Laren Player, [facility], disclaims any warranty or liability for your use of this information.         A Healthy Heart: Care Instructions  Overview     Coronary artery disease, also called heart disease, occurs when a substance called plaque builds up in the vessels that supply oxygen-rich blood to your heart muscle. This can narrow the blood vessels and reduce blood flow. A heart attack happens when blood flow is completely blocked. A high-fat diet, smoking, and other factors increase the risk of heart disease.  Your doctor has found that you have a chance of having heart disease. A heart-healthy lifestyle can help keep your heart healthy and prevent heart disease. This lifestyle includes eating healthy, being active, staying at a weight that's healthy for you, and not smoking or using tobacco. It also includes taking medicines as directed, managing other health conditions, and trying to get a healthy amount of sleep.  Follow-up care is a key  part of your treatment and safety. Be sure to make and go to all appointments, and call your doctor if you are having problems. It's also a good idea to know your test results and keep a list of the medicines you take.  How can you care for yourself at home?  Diet    Use less salt when you cook and eat. This helps lower your blood pressure. Taste food before salting. Add only a little salt when you think you need it. With time, your taste buds will adjust  to less salt.     Eat fewer snack items, fast foods, canned soups, and other high-salt, high-fat, processed foods.     Read food labels and try to avoid saturated and trans fats. They increase your risk of heart disease by raising cholesterol levels.     Limit the amount of solid fat--butter, margarine, and shortening--you eat. Use olive, peanut, or canola oil when you cook. Bake, broil, and steam foods instead of frying them.     Eat a variety of fruit and vegetables every day. Dark green, deep orange, red, or yellow fruits and vegetables are especially good for you. Examples include spinach, carrots, peaches, and berries.     Foods high in fiber can reduce your cholesterol and provide important vitamins and minerals. High-fiber foods include whole-grain cereals and breads, oatmeal, beans, brown rice, citrus fruits, and apples.     Eat lean proteins. Heart-healthy proteins include seafood, lean meats and poultry, eggs, beans, peas, nuts, seeds, and soy products.     Limit drinks and foods with added sugar. These include candy, desserts, and soda pop.   Heart-healthy lifestyle    If your doctor recommends it, get more exercise. For many people, walking is a good choice. Or you may want to swim, bike, or do other activities. Bit by bit, increase the time you're active every day. Try for at least 30 minutes on most days of the week.     Try to quit or cut back on using tobacco and other nicotine products. This includes smoking and vaping. If you need help quitting,  talk to your doctor about stop-smoking programs and medicines. These can increase your chances of quitting for good. Quitting is one of the most important things you can do to protect your heart. It is never too late to quit. Try to avoid secondhand smoke too.     Stay at a weight that's healthy for you. Talk to your doctor if you need help losing weight.     Try to get 7 to 9 hours of sleep each night.     Limit alcohol to 2 drinks a day for men and 1 drink a day for women. Too much alcohol can cause health problems.     Manage other health problems such as diabetes, high blood pressure, and high cholesterol. If you think you may have a problem with alcohol or drug use, talk to your doctor.   Medicines    Take your medicines exactly as prescribed. Call your doctor if you think you are having a problem with your medicine.     If your doctor recommends aspirin, take the amount directed each day. Make sure you take aspirin and not another kind of pain reliever, such as acetaminophen  (Tylenol ).   When should you call for help?   Call 911 if you have symptoms of a heart attack. These may include:    Chest pain or pressure, or a strange feeling in the chest.     Sweating.     Shortness of breath.     Pain, pressure, or a strange feeling in the back, neck, jaw, or upper belly or in one or both shoulders or arms.     Lightheadedness or sudden weakness.     A fast or irregular heartbeat.   After you call 911, the operator may tell you to chew 1 adult-strength or 2 to 4 low-dose aspirin. Wait for an ambulance. Do not try to drive yourself.  Watch closely for  changes in your health, and be sure to contact your doctor if you have any problems.  Where can you learn more?  Go to [employer] and enter F075 to learn more about "A Healthy Heart: Care Instructions."  Current as of: October 23, 2022  Content Version: 14.4   2024-2025 [place], [place].   Care instructions adapted under license by [facility]. If you have questions about a medical condition or this instruction, always ask your healthcare professional. Laren Player, [facility], disclaims any warranty or liability for your use of this information.    Personalized Preventive Plan for Chad Rosario - 08/08/2023  Medicare offers a range of preventive health benefits. Some of the tests and screenings are paid in full while other may be subject to a deductible, co-insurance, and/or copay.  Some of these benefits include a comprehensive review of your medical history including lifestyle, illnesses that may run in your family, and various assessments and screenings as appropriate.  After reviewing your medical record and screening and assessments performed today your provider may have ordered immunizations, labs, imaging, and/or referrals for you.  A list of these orders (if applicable) as well as your Preventive Care list are included within your After Visit Summary for your review.

## 2023-08-10 NOTE — Assessment & Plan Note (Signed)
 I proceeded to do an EKG which shows sinus rhythm with ventricular ectopic beat. He does appear to be going in and out of afib despite ablation. Will send note to dr dowdy as he is supposed to see him sooner. Patient is asymptomatic.

## 2023-08-10 NOTE — Assessment & Plan Note (Signed)
 Monitored by specialist- no acute findings meriting change in the plan

## 2023-08-12 ENCOUNTER — Encounter: Payer: MEDICARE | Attending: Family Medicine | Primary: Family Medicine

## 2023-08-12 ENCOUNTER — Encounter

## 2023-08-12 NOTE — Telephone Encounter (Signed)
 PLEASE SET UP LAB APPOINTMENT FOR PATIENT

## 2023-08-13 ENCOUNTER — Encounter: Payer: MEDICARE | Attending: Neurology | Primary: Family Medicine

## 2023-08-14 ENCOUNTER — Other Ambulatory Visit: Admit: 2023-08-14 | Discharge: 2023-08-14 | Payer: MEDICARE | Primary: Family Medicine

## 2023-08-14 DIAGNOSIS — Z125 Encounter for screening for malignant neoplasm of prostate: Secondary | ICD-10-CM

## 2023-08-14 LAB — PSA SCREENING: PSA: 1.1 ng/mL (ref 0.0–4.0)

## 2023-08-19 ENCOUNTER — Ambulatory Visit
Admit: 2023-08-19 | Discharge: 2023-08-19 | Payer: MEDICARE | Attending: Cardiovascular Disease | Primary: Family Medicine

## 2023-08-19 ENCOUNTER — Encounter: Payer: MEDICARE | Primary: Family Medicine

## 2023-08-19 ENCOUNTER — Ambulatory Visit: Admit: 2023-08-19 | Payer: MEDICARE | Primary: Family Medicine

## 2023-08-19 ENCOUNTER — Encounter

## 2023-08-19 VITALS — BP 130/70 | HR 66 | Ht 73.0 in | Wt 285.0 lb

## 2023-08-19 VITALS — BP 154/80 | Ht 73.0 in | Wt 283.0 lb

## 2023-08-19 DIAGNOSIS — Z9889 Other specified postprocedural states: Secondary | ICD-10-CM

## 2023-08-19 DIAGNOSIS — I4819 Other persistent atrial fibrillation: Secondary | ICD-10-CM

## 2023-08-19 LAB — ECHO (TTE) COMPLETE (PRN CONTRAST/BUBBLE/STRAIN/3D)
AV Mean Gradient: 9 mmHg
AV Mean Velocity: 1.4 m/s
AV VTI: 39.9 cm
Body Surface Area: 2.57 m2
EF Physician: 60 %
Est. RA Pressure: 3 mmHg
LA Area 2C: 29.4 cm2
LA Area 4C: 25.9 cm2
LA Diameter: 5.5 cm
LA Major Axis: 5.9 cm
LA Minor Axis: 6 cm
LA Size Index: 2.21 cm/m2
LA Volume BP: 103 mL — AB (ref 18–58)
LA Volume Index BP: 41 mL/m2 — AB (ref 16–34)
LA Volume Index MOD A2C: 46 mL/m2 — AB (ref 16–34)
LA Volume Index MOD A4C: 37 mL/m2 — AB (ref 16–34)
LA Volume MOD A2C: 115 mL — AB (ref 18–58)
LA Volume MOD A4C: 91 mL — AB (ref 18–58)
LV E' Lateral Velocity: 9.85 cm/s
LV E' Septal Velocity: 6.43 cm/s
LV EDV A2C: 91 mL
LV EDV A4C: 93 mL
LV EDV Index A2C: 37 mL/m2
LV EDV Index A4C: 37 mL/m2
LV ESV A2C: 32 mL
LV ESV A4C: 38 mL
LV ESV Index A2C: 13 mL/m2
LV ESV Index A4C: 15 mL/m2
LV Ejection Fraction A4C: 59 %
LVOT Mean Gradient: 3 mmHg
LVOT VTI: 27.3 cm
LVOT:AV VTI Index: 0.68
MV A Velocity: 0.81 m/s
MV E Wave Deceleration Time: 278 ms
RV Basal Dimension: 4.2 cm
RV Mid Dimension: 3.3 cm
RVIDd: 4 cm
TAPSE: 2.4 cm (ref 1.7–?)

## 2023-08-19 NOTE — Telephone Encounter (Signed)
 Patient is an established patient with Dr. Ceclia Cohens. He was last seen 04/08/23.    Per Dr. Wilbur Handing at [facility] - schedule urgent follow up for Thrombocytopenia.    Once patient is scheduled please assign referral # [id number] to appointment. Thank you!

## 2023-08-19 NOTE — Progress Notes (Signed)
 [facility] Follow Up                 Reason for Visit: History of atrial fibrillation    Subjective:     Patient is a 68 y.o. male with a PMH of atrial fibrillation status post ablation, TAA, CAD status post CABG in 2015, statin intolerance, hypertension, hyperlipidemia, thrombocytopenia, chronic venous insufficiency and DVT who presents for follow-up.  The patient was last seen in February 2025.  A DCCV was pursued as well as an EP referral for further management of persistent AF.  Blood work was ordered.  It was noted that his TAA would be followed up with a TEE.  He was noted to have thrombocytopenia with a platelet count of 75,000 of an unknown etiology.  A message was sent to his hematologist to see if it was safe to continue the medication.  In a delayed fashion, hematology ultimately recommended CBCs while the patient was on the medication and that it was acceptable to continue a DOAC as long as platelet count >50K.  However, he has no follow-up with hematology. He was asked to follow-up with EP to consider Watchman although he has a history of DVT.  The patient underwent PVI and CTI ablation in February 2025.  According to documentation from the EP, his thrombocytopenia was discussed with him and Xarelto  was continued.  The patient had a TTE today that was noted to demonstrate a normal EF.  He had a TEE in February 2025 that noted a 4 cm ascending aorta.  The patient follows with ID with a history of total shoulder arthroplasty infection.  He has follow-up with EP in June.  He says that he has two more weeks of antibiotics for his shoulder infection. He reports minimally symptomatic and well tolerated palpitations.      Past Medical History:   Diagnosis Date    CAD (coronary artery disease) 2015    Gout     Hypertension     controlled with meds    Neuropathy May 2019    Osteoarthritis 03/2013    Sleep apnea     cpap at night      Past Surgical History:   Procedure Laterality Date    BACK SURGERY  2021     has had 3 different ones last was in 2021    CARDIAC SURGERY      CABG    CHOLECYSTECTOMY      COLONOSCOPY N/A 11/29/2020    COLONOSCOPY POLYPECTOMY HOT SNARE performed by Margurette Shillings, MD at [facility] ENDOSCOPY    EP DEVICE PROCEDURE N/A 05/22/2023    Ablation A-fib w complete ep study performed by Nat Badger, MD at [facility] C[facility]AC CATH LAB    EYE SURGERY Left     cataract    FRACTURE SURGERY Left     tib-fib fracture    JOINT REPLACEMENT  Hip - 2018    KNEE ARTHROSCOPY Right     TOTAL HIP ARTHROPLASTY Left       Family History   Problem Relation Age of Onset    Cancer Mother         Lymp    Stroke Father     Cancer Sister         Breast    Clotting Disorder Maternal Grandfather       Social History     Tobacco Use    Smoking status: Never    Smokeless tobacco: Never   Substance Use  Topics    Alcohol use: Yes     Alcohol/week: 12.0 standard drinks of alcohol     Types: 6 Cans of beer, 6 Shots of liquor per week     Comment: socially      Allergies   Allergen Reactions    Hydrocodone-Acetaminophen  Anxiety and Itching    Losartan Shortness Of Breath    Amlodipine Other (See Comments)     Other reaction(s): Other (See Comments)  Fatigue   Fatigue       Rosuvastatin Other (See Comments)     Other reaction(s): Constipation  Fatigue, constipation  Other reaction(s): Fatigue    Atorvastatin Diarrhea    Ezetimibe      Other reaction(s): Constipation, Other (See Comments)  Constipation and decreased urine output  Constipation and decreased urine output  Other reaction(s): Constipation, Urine output low           ROS:  No obvious pertinent positives on review of systems except for what was outlined above.       Objective:       BP 130/70   Pulse 66   Ht 1.854 m (6\' 1" )   Wt 129.3 kg (285 lb)   BMI 37.60 kg/m     BP Readings from Last 3 Encounters:   08/19/23 130/70   08/19/23 (!) 154/80   08/08/23 (!) 140/80       Wt Readings from Last 3 Encounters:   08/19/23 129.3 kg (285 lb)   08/19/23 128.4 kg (283 lb)   08/08/23  128.4 kg (283 lb)       General/Constitutional:   Alert and oriented x 3, no acute distress  HEENT:   normocephalic, atraumatic, moist mucous membranes  Neck:   No JVD or carotid bruits bilaterally  Cardiovascular:  IRIR, no rub/gallop appreciated  Pulmonary:   clear to auscultation bilaterally, no respiratory distress  Abdomen:   soft, non-tender, non-distended  Ext:   No sig LE edema bilaterally  Skin:  warm and dry, no obvious rashes seen  Neuro:   no obvious sensory or motor deficits  Psychiatric:   normal mood and affect    ECG:   Sinus rhythm   PACs  Heart rate 68 BPM    Data Review:   Lab Results   Component Value Date    CHOL 170 08/08/2023    CHOL 161 06/10/2023    CHOL 144 03/11/2023     Lab Results   Component Value Date    TRIG 111 08/08/2023    TRIG 154 (H) 06/10/2023    TRIG 79 03/11/2023     Lab Results   Component Value Date    HDL 53 08/08/2023    HDL 46 03/11/2023    HDL 45 08/07/2022     No components found for: "LDLCHOLESTEROL", "LDLCALC"  Lab Results   Component Value Date    VLDL 22 08/08/2023    VLDL 16 03/11/2023    VLDL 23 08/07/2022     Lab Results   Component Value Date    CHOLHDLRATIO 3.2 08/08/2023    CHOLHDLRATIO 3.1 03/11/2023    CHOLHDLRATIO 4.0 08/07/2022        Lab Results   Component Value Date/Time    NA 136 08/08/2023 10:21 AM    NA 136 05/22/2023 05:52 AM    NA 138 05/21/2023 10:28 AM    K 4.8 08/08/2023 10:21 AM    K 4.6 05/22/2023 05:52 AM    K 4.8 05/21/2023 10:28 AM  CL 102 08/08/2023 10:21 AM    CL 105 05/22/2023 05:52 AM    CL 101 05/21/2023 10:28 AM    CO2 26 08/08/2023 10:21 AM    CO2 22 05/22/2023 05:52 AM    CO2 24 05/21/2023 10:28 AM    BUN 19 08/08/2023 10:21 AM    BUN 22 05/22/2023 05:52 AM    BUN 26 05/21/2023 10:28 AM    CREATININE 1.08 08/08/2023 10:21 AM    CREATININE 1.14 05/22/2023 05:52 AM    CREATININE 1.20 05/21/2023 10:28 AM    GLUCOSE 109 08/08/2023 10:21 AM    GLUCOSE 109 06/10/2023 09:04 AM    GLUCOSE 121 05/22/2023 05:52 AM    GLUCOSE 114 05/21/2023  10:28 AM    GLUCOSE 127 11/21/2021 09:42 AM    CALCIUM 9.2 08/08/2023 10:21 AM    CALCIUM 8.9 05/22/2023 05:52 AM    CALCIUM 9.8 05/21/2023 10:28 AM         Lab Results   Component Value Date    ALT 52 08/08/2023    ALT 38 06/10/2023    ALT 48 05/21/2023    AST 45 (H) 08/08/2023    AST 53 (H) 05/21/2023    AST 48 (H) 04/17/2023        Assessment/Plan:   1. Status post ablation of atrial fibrillation  - CHA2DS2-VASc equals 4   - Per prior hematologist, DOAC acceptable as long as platelet >50K  - The patient's history of DVT indicates a persistent risk of VTE, which often necessitates long-term anticoagulation independent of AF: Because the primary benefit of Watchman is to eliminate the need for chronic anticoagulation in AF patients, its utility is limited in this context where anticoagulation would likely still be required: Follow up hematology insight into persistent risk of VTE  - Refer to hematology urgently (Dr. Rolley Clinton)  - Currently on Eliquis   - Minimally symptomatic and well-tolerated palpitations in the setting of PACs: Continue with Toprol  XL    2. Thoracic aortic aneurysm without rupture, unspecified part  - 4 cm TAA noted on TEE in February 2025  - Obtain a thoracic CTA    3. Hx of CABG  4. Hyperlipidemia, unspecified hyperlipidemia type  - In patients with AF and CCD (beyond 1 year after revascularization or CAD not requiring coronary revascularization) without history of stent thrombosis, oral anticoagulation monotherapy is recommended over the combination therapy of OAC and single APT (aspirin or P2Y12 inhibitor) to decrease the risk of major bleeding (class I recommendation; 2023 ACC guidelines for AF)  - History of statin intolerance     5. Hypertension, unspecified type  - Well-controlled  - Recommend measuring BP at home and submitting measurements to clinic  - Continue with Toprol -XL  - Currently on lisinopril     6. History of DVT (deep vein thrombosis)  7. Thrombocytopenia  - See "status post  ablation of atrial fibrillation" above  - Currently on Eliquis   - Unclear etiology of thrombocytopenia  - Refer to hematology urgently (Dr. Rolley Clinton)    Time spent on this encounter: 40 minutes    F/U: 6 months    Baxter Gonzalez M Dajohn Ellender, MD

## 2023-08-20 ENCOUNTER — Inpatient Hospital Stay: Admit: 2023-08-20 | Discharge: 2023-08-20 | Payer: MEDICARE | Primary: Family Medicine

## 2023-08-20 VITALS — BP 138/67 | HR 68 | Temp 98.20000°F | Resp 18 | Ht 73.0 in | Wt 271.0 lb

## 2023-08-20 DIAGNOSIS — L97812 Non-pressure chronic ulcer of other part of right lower leg with fat layer exposed: Secondary | ICD-10-CM

## 2023-08-20 DIAGNOSIS — T8131XD Disruption of external operation (surgical) wound, not elsewhere classified, subsequent encounter: Secondary | ICD-10-CM

## 2023-08-20 MED ORDER — VASHE WOUND THERAPY EX SOLN
CUTANEOUS | Status: DC | PRN
Start: 2023-08-20 — End: 2023-08-20
  Administered 2023-08-20: 14:00:00 via TOPICAL

## 2023-08-20 MED ORDER — LIDOCAINE HCL URETHRAL/MUCOSAL 2 % EX GEL
2 | CUTANEOUS | Status: DC | PRN
Start: 2023-08-20 — End: 2023-08-20
  Administered 2023-08-20: 14:00:00 via TOPICAL

## 2023-08-20 NOTE — Other (Signed)
 08/20/23 0915   Right Leg Edema Point of Measurement   Leg circumference 42 cm   Ankle circumference 28 cm   Foot circumference 26 cm   Compression Therapy Compression ordered   Wound 05/20/23 Pretibial Right;Lower #1   Date First Assessed/Time First Assessed: 05/20/23 1412   Present on Original Admission: Yes  Wound Approximate Age at First Assessment (Weeks): 2 weeks  Primary Wound Type: (c) Venous Ulcer  Location: Pretibial  Wound Location Orientation: Right;Lower...   Wound Image     Wound Etiology Surgical   Dressing Status Old drainage noted   Wound Cleansed Cleansed with saline   Dressing/Treatment Hydrofera blue   Wound Length (cm) 0.7 cm   Wound Width (cm) 0.7 cm   Wound Depth (cm) 0.1 cm   Wound Surface Area (cm^2) 0.49 cm^2   Change in Wound Size % (l*w) 93.72   Wound Volume (cm^3) 0.049 cm^3   Wound Healing % 97   Post-Procedure Length (cm) 0.4 cm   Post-Procedure Width (cm) 0.4 cm   Post-Procedure Depth (cm) 0.1 cm   Post-Procedure Surface Area (cm^2) 0.16 cm^2   Post-Procedure Volume (cm^3) 0.016 cm^3   Wound Assessment Granulation tissue   Drainage Amount Small (< 25%)   Drainage Description Serosanguinous   Odor None   Peri-wound Assessment Blanchable erythema   Wound Thickness Description not for Pressure Injury Full thickness   Pain Assessment   Pain Assessment None - Denies Pain     Pt is taking Eliquis .

## 2023-08-20 NOTE — Wound Image (Signed)
 Discharge Instructions for  [facility]  [address]  [address]  [place], [place] [address]  Phone [phone]   Fax [phone]      NAME:  Chad Rosario  DATE OF BIRTH:  01-19-1956  MEDICAL RECORD NUMBER:  [id number]  DATE:  08/20/2023    Return Appointment:  Discharge with Neysa Bares, NP    Instructions: Right lower leg:  Cleanse with Vashe (may purchase at [facility] let sit on wound for at least one minute  Apply Xeroform to wound bed, single layer    Cover with Adhesive bandage  Change daily  Wear Compressions socks during the day.    Elevate legs when sitting.  Avoid prolonged standing or sitting with legs in dependent position.     Elevate legs while sitting  Eat minimum of 100 grams of protein per day  Protein:   Egg ~ 6g  Yogurt ~ 15g  Half cup of cottage cheese ~ 15g  Chicken breast ~ 30g  Salmon filet ~ 40g     Should you experience increased redness, swelling, pain, foul odor, size of wound(s), or have a temperature over 101 degrees please contact the Wound Healing Center at [phone] or if after hours contact your primary care physician or go to the hospital emergency department.    PLEASE NOTE: IF YOU ARE UNABLE TO OBTAIN WOUND SUPPLIES, CONTINUE TO USE THE SUPPLIES YOU HAVE AVAILABLE UNTIL YOU ARE ABLE TO REACH US . IT IS MOST IMPORTANT TO KEEP THE WOUND COVERED AT ALL TIMES.    Electronically signed Abron Hoist, RN, WCC on 08/20/2023 at 9:30 AM

## 2023-08-20 NOTE — Telephone Encounter (Signed)
 Attempted to contact patient about appointment.  Was hung up on.    Will send mychart message to have patient call the office to schedule.

## 2023-08-20 NOTE — Assessment & Plan Note (Signed)
 Assessment  Wound is smaller today - wound bed is red granulation with some new epithelial tissue to wound edges, no localized edema or periwound erythema to suggest infection  Edema is well managed  Patient is requesting discharge  Plan  Selective debridement remove devitalized tissue and promote wound healing  Wound care: Vashe cleanse, Xeroform gauze, OTC compression stockings  [facility] discharge education -  Patient encouraged to elevate leg at/above level of the heart when possible   Recommend daily use of: OTC compression stockings; replace every 3 months  Will need compression for life  Discharge from clinic - RTC PRN open wound

## 2023-08-20 NOTE — Progress Notes (Signed)
 [facility]  Procedure Note    Chad Rosario [facility]  MEDICAL RECORD NUMBER: [id number]  AGE: 68 y.o.     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, right (HCC) (Chronic)    Assessment  Wound is smaller today - wound bed is red granulation with some new epithelial tissue to wound edges, no localized edema or periwound erythema to suggest infection  Edema is well managed  Patient is requesting discharge  Plan  Selective debridement remove devitalized tissue and promote wound healing  Wound care: Vashe cleanse, Xeroform gauze, OTC compression stockings  [facility] discharge education -  Patient encouraged to elevate leg at/above level of the heart when possible   Recommend daily use of: OTC compression stockings; replace every 3 months  Will need compression for life  Discharge from clinic - RTC PRN open wound            Procedure Note: Selective Debridement/Non-Excisional Debridement  Indications: Based on my examination of the patient's wound(s) today, debridement is required to remove devitalized tissue, evaluate the wound base, and promote wound healing.  Performed by: Chad Manifold, APRN - NP  Consent obtained: Yes  Time out taken: Yes  Pain control:     Wound #: 1  Diabetic/pressure/chronic non-pressure ulcers only: non-pressure ulcer, fat layer exposed was/were debrided using curette. The wound(s) was/were debrided down through/to subcutaneous tissue. Devitalized tissue debrided: biofilm and exudate. Pre and post debridement measurements: see flow sheet.      08/20/23 0915    Right Leg Edema Point of Measurement   Leg circumference 42 cm   Ankle circumference 28 cm   Foot circumference 26 cm   Compression Therapy Compression ordered   Wound 05/20/23 Pretibial Right;Lower #1    Date First Assessed/Time First Assessed: 05/20/23 1412   Present on Original Admission: Yes  Wound Approximate Age at First Assessment (Weeks): 2 weeks  Primary Wound Type: (c) Venous Ulcer  Location: Pretibial  Wound Location Orientation: Right;Lower...   Wound Image                Wound Etiology Surgical   Dressing Status Old drainage noted   Wound Cleansed Cleansed with saline   Dressing/Treatment Hydrofera blue   Wound Length (cm) 0.7 cm   Wound Width (cm) 0.7 cm   Wound Depth (cm) 0.1 cm   Wound Surface Area (cm^2) 0.49 cm^2   Change in Wound Size % (l*w) 93.72   Wound Volume (cm^3) 0.049 cm^3   Wound Healing % 97   Post-Procedure Length (cm) 0.4 cm   Post-Procedure Width (cm) 0.4 cm   Post-Procedure Depth (cm) 0.1 cm   Post-Procedure Surface Area (cm^2) 0.16 cm^2   Post-Procedure Volume (cm^3) 0.016 cm^3   Wound Assessment Granulation tissue   Drainage Amount Small (< 25%)   Drainage Description Serosanguinous   Odor None   Peri-wound Assessment Blanchable erythema   Wound Thickness Description not for Pressure Injury Full thickness   Pain Assessment   Pain Assessment None - Denies Pain     Total surface area debrided: 0.16 sq cm   Estimated blood loss: minimal amount blood loss  Hemostasis achieved: pressure  Procedural pain: 0 /  10   Post procedural pain: 0 / 10   Response to treatment: tolerated procedure well with no complaints of pain    This dictation may have been generated in part by voice recognition computer software. Although all attempts are made to edit the dictation for accuracy, there may be errors in the transcription that are not intended.

## 2023-08-27 ENCOUNTER — Inpatient Hospital Stay
Admit: 2023-08-27 | Discharge: 2023-08-29 | Payer: MEDICARE | Attending: Cardiovascular Disease | Primary: Family Medicine

## 2023-08-27 DIAGNOSIS — I712 Thoracic aortic aneurysm, without rupture, unspecified: Secondary | ICD-10-CM

## 2023-08-27 MED ORDER — IOPAMIDOL 76 % IV SOLN
76 | Freq: Once | INTRAVENOUS | Status: AC | PRN
Start: 2023-08-27 — End: 2023-08-27
  Administered 2023-08-27: 12:00:00 100 mL via INTRAVENOUS

## 2023-08-27 MED FILL — ISOVUE-370 76 % IV SOLN: 76 % | INTRAVENOUS | Qty: 100 | Fill #0

## 2023-08-27 NOTE — Telephone Encounter (Signed)
 I spoke w/pt.informed of MD response.He said that he heard back from the hematologist and they were saying since he was an established pt.that he would have to stick w/Dr.Ali.Pt.says he pleaded his case that he wanted a 2nd look by another hematologist and was told they would have to discuss w/manager and get back w/him.He has not heard back as of yet.    I reached out to the [facility] and was told they reached out to pt.hung up and then they sent a mychart message.I told her the pt.says someone talked to him after that when he called back and he was told someone would reach out to him after talking w/manager and he had not heard back.[facility] Center does not see that noted in chart and said pt.will need to reach out to them again.I called and notified pt.and he will reach out again to the [facility].

## 2023-08-27 NOTE — Telephone Encounter (Signed)
-----   Message from Dr. Wilbur Handing, MD sent at 08/27/2023 12:06 PM EDT -----  Please let the patient know that the ascending aorta is 4.3 cm.  This warrants a surveillance thoracic CTA in 1 year.  Why does he not have an appointment with hematology?  He was referred urgently to hematology last clinic appointment (Dr. Rolley Clinton) with a change in providers.

## 2023-09-08 ENCOUNTER — Encounter: Payer: MEDICARE | Attending: Family Medicine | Primary: Family Medicine

## 2023-09-12 ENCOUNTER — Ambulatory Visit
Admit: 2023-09-12 | Discharge: 2023-09-12 | Payer: MEDICARE | Attending: Cardiovascular Disease | Primary: Family Medicine

## 2023-09-12 VITALS — BP 136/68 | HR 69 | Ht 73.0 in | Wt 270.0 lb

## 2023-09-12 DIAGNOSIS — Z8679 Personal history of other diseases of the circulatory system: Secondary | ICD-10-CM

## 2023-09-12 DIAGNOSIS — Z9889 Other specified postprocedural states: Secondary | ICD-10-CM

## 2023-09-12 NOTE — Progress Notes (Signed)
 [facility]  [address], [address]  [place], [place] [address]  PHONE: [phone]        09/12/23      NAME:  Chad Rosario  DOB: February 04, 1956  MRN: [id number]     Referring Cardiologist: Lonni HILARIO Sharps, MD and Arthea Lingo, MD     Reason for Consultation: Atrial fibrillation      ASSESSMENT and PLAN:  Atrial fibrillation, mildly persistent, CHADS2VASc = 4 s/p AF ablation (PFA) on 04/2023  HFpEF  CAD s/p CABG  Thrombocytopenia  HTN  HLD  DVT     68 year old male with a history of mildly persistent AF admitted overnight with recurrent severely symptomatic AF with RVR and he is s/p AF ablation.      AF, persistent   -Continue BB, on reduced dose to 25 mg po nightly.   -s/p AF ablation. Remains in NSR.  -Fatigued but improving.      Stroke ppx  -Continue Xarelto . Candidate for Watchman? Upcoming appt with Dr. Nalani in hematology.      HFpEF  -Continue GDMT as tolerated.   -S/p AF ablation.      DVT  -Long term use of OAC? Continue Xarelto .    -Upcoming appt with Dr. Nalani to further discuss options, need for long term therapy.      Thrombocytopenia  -currently undergoing evaluation with hematology. Has been >50K and stable. Discussed with pt.   -Reviewed recent CBC, platelet count of 53.     Routine cardiac care per Dr. Lingo.     EP follow up in 6 months or PRN.     Patient has been instructed and agrees to call our office with any issues or other concerns related to their cardiac condition(s) and/or complaint(s).    No follow-up provider specified.    Thank you for allowing me to participate in the electrophysiologic care of [name] [facility]. Please contact me if any questions or concerns were to arise.    Reyes PARAS. Cheyrl Buley MD, MS  Clinical Cardiac Electrophysiology  [facility]  09/12/23  11:12 AM    ===================================================================  Chief Complant:    Chief Complaint   Patient presents with    Congestive Heart Failure    Atrial  Fibrillation        History:  Chad Rosario is a most pleasant 68 y.o. male with a past medical and cardiac history significant for chronic HFpEF, persistent atrial fibrillation, TAA, CAD status post CABG in 2015, statin intolerance, hypertension, hyperlipidemia, secondary erythrocytosis, chronic venous insufficiency and DVT who presented to ED with irregular heart beat. He is followed by Dr. Lingo. He presented to clinic at that time with DOE with an ECG consistent with AF with RVR in January. Similar presentation this time around. AF was newly diagnosed in January. He was directly admitted to the hospital. He underwent IV diuresis with documented -5 L. He had a TTE in January 2025 that noted a normal EF and mild MR. His ascending aorta was noted to be 4 cm. He was noted to have a moderately dilated LA. 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 He does have issues with a chronically low platelet count to which he sees hematology due to chronic liver disease?    He comes in for follow up. He remains in NSR after AF ablation, few palps. Mild fatigue.      Cardiac PMH: (Old records  have been reviewed and summarized below)    AF ablation: 05/23/2023      EKG:  (EKG has been independently visualized by me with interpretation below): NSR, PACs, normal axis, no ischemia.     ECHO: 07/2023  Left Ventricle Normal left ventricular systolic function with a visually estimated EF of 55 - 60%. The LV is poorly visualized in the PLAX view such that LV wall thickness and LV size cannot be accurately determined on the study.  Indeterminate diastolic function due to MAC.   Left Atrium Left atrium is mildly dilated.   Right Ventricle Right ventricle size is normal. Normal systolic function.   Right Atrium Right atrium is dilated.   Aortic Valve Not well visualized. Mild sclerosis of the aortic valve cusps. No regurgitation. No stenosis.   Mitral Valve There  is moderate annular calcification noted. Trace regurgitation. No stenosis noted.   Tricuspid Valve Valve structure is normal. Trace regurgitation. No stenosis noted.   Pulmonic Valve The pulmonic valve was not well visualized. No regurgitation. No stenosis noted.   Aorta Not well visualized.   IVC/Hepatic Veins IVC diameter is normal or and decreases greater than 50% during inspiration; therefore the estimated right atrial pressure is normal (~3 mmHg).   Pericardium No pericardial effusion.       ECHO: 03/2023  Left Ventricle Normal left ventricular systolic function with a visually estimated EF of 55 - 60%. Left ventricle size is normal. Normal wall thickness. Normal wall motion. Indeterminate diastolic function due to atrial fibrillation.   Left Atrium Left atrium is moderately dilated. LA Vol Index is  39-45 ml/m2.   Right Ventricle Right ventricle size is normal. Normal systolic function. TAPSE is 1.9 cm.   Right Atrium Right atrium is mildly dilated.   Aortic Valve Moderately thickened cusps. Mildly calcified cusps. Moderate sclerosis of the aortic valve cusps. No regurgitation. No stenosis.   Mitral Valve Moderate annular calcification. Mildly thickened, at the anterior leaflet. Mild regurgitation. No stenosis noted.   Tricuspid Valve Valve structure is normal. Trace regurgitation. No stenosis noted. Unable to assess RVSP due to inadequate or insignificant tricuspid regurgitation.   Pulmonic Valve Valve structure is normal. Physiologically normal regurgitation. No stenosis noted.   Aorta Normal sized aortic root. Ao root diameter is 3.2 cm. Mildly dilated ascending aorta. Ao ascending diameter is 4.0 cm.   IVC/Hepatic Veins IVC diameter is less than or equal to 21 mm and decreases greater than 50% during inspiration; therefore the estimated right atrial pressure is normal (~3 mmHg). IVC size is normal.   Pericardium No pericardial effusion.      Previous Heart Catheterization: n/a     Stress Test: n/a      DEVICE INTERROGATION: n/a     Past Medical History, Past Surgical History, Family history, Social History, and Medications were all reviewed with the patient today and updated as necessary.     Current Outpatient Medications   Medication Sig Dispense Refill    apixaban  (ELIQUIS ) 5 MG TABS tablet Take 1 tablet by mouth 2 times daily      metoprolol  succinate (TOPROL  XL) 25 MG extended release tablet Take 1 tablet by mouth daily 90 tablet 3    azelastine (ASTEPRO) 137 MCG/SPRAY nasal spray USE 1 TO 2 SPRAYS IN EACH NOSTRIL TWICE DAILY AS NEEDED AS DIRECTED      lisinopril  (PRINIVIL ;ZESTRIL ) 20 MG tablet Take 2 tablets by mouth daily 180 tablet 1    furosemide  (LASIX ) 40 MG tablet Take 1  tablet by mouth daily as needed (take one tablet daily for weight garin greater than 2 lbs in 24 hours or 4 lbs in 48 hours.) 30 tablet 3    Cyanocobalamin (VITAMIN B 12 PO) Take by mouth every morning      allopurinol  (ZYLOPRIM ) 300 MG tablet TAKE 1 TABLET BY MOUTH DAILY 90 tablet 3    gabapentin  (NEURONTIN ) 600 MG tablet Take 1 tablet by mouth in the morning, at noon, in the evening, and at bedtime. 360 tablet 3    Multiple Vitamins-Minerals (THERAPEUTIC MULTIVITAMIN-MINERALS) tablet Take 1 tablet by mouth daily      vitamin D3 (CHOLECALCIFEROL ) 10 MCG (400 UNIT) TABS tablet Take 2 tablets by mouth daily      CPAP Machine MISC by Does not apply route      colchicine  (MITIGARE ) 0.6 MG capsule Take 1 capsule by mouth as needed for Pain 30 capsule 3    fexofenadine (ALLEGRA) 180 MG tablet Take 1 tablet by mouth daily      doxycycline  hyclate (VIBRAMYCIN ) 100 MG capsule Take 1 capsule by mouth 2 times daily (Patient not taking: Reported on 09/12/2023)       No current facility-administered medications for this visit.     Allergies   Allergen Reactions    Hydrocodone-Acetaminophen  Anxiety and Itching    Losartan Shortness Of Breath    Amlodipine Other (See Comments)     Other reaction(s): Other (See Comments)  Fatigue   Fatigue        Rosuvastatin Other (See Comments)     Other reaction(s): Constipation  Fatigue, constipation  Other reaction(s): Fatigue    Atorvastatin Diarrhea    Ezetimibe      Other reaction(s): Constipation, Other (See Comments)  Constipation and decreased urine output  Constipation and decreased urine output  Other reaction(s): Constipation, Urine output low         Past Medical History:   Diagnosis Date    CAD (coronary artery disease) 2015    Gout     Hypertension     controlled with meds    Neuropathy May 2019    Osteoarthritis 03/2013    Sleep apnea     cpap at night     Past Surgical History:   Procedure Laterality Date    BACK SURGERY  2021    has had 3 different ones last was in 2021    CARDIAC SURGERY      CABG    CHOLECYSTECTOMY      COLONOSCOPY N/A 11/29/2020    COLONOSCOPY POLYPECTOMY HOT SNARE performed by Randalyn Romans, MD at [facility] ENDOSCOPY    EP DEVICE PROCEDURE N/A 05/22/2023    Ablation A-fib w complete ep study performed by Carlon Reyes PARAS, MD at [facility] CARDIAC CATH LAB    EYE SURGERY Left     cataract    FRACTURE SURGERY Left     tib-fib fracture    JOINT REPLACEMENT  Hip - 2018    KNEE ARTHROSCOPY Right     TOTAL HIP ARTHROPLASTY Left      Family History   Problem Relation Age of Onset    Cancer Mother         Lymp    Stroke Father     Cancer Sister         Breast    Clotting Disorder Maternal Grandfather      Social History     Tobacco Use    Smoking status: Never    Smokeless  tobacco: Never   Substance Use Topics    Alcohol use: Yes     Alcohol/week: 12.0 standard drinks of alcohol     Types: 6 Cans of beer, 6 Shots of liquor per week     Comment: socially       ROS:  A comprehensive review of systems was performed with the pertinent positives and negatives as noted in the HPI in addition to:  Review of Systems   Constitutional: Negative.    HENT: Negative.     Eyes: Negative.    Respiratory: Negative.     Cardiovascular: Negative.    Gastrointestinal: Negative.    Endocrine: Negative.    Genitourinary:  Negative.    Musculoskeletal: Negative.    Skin: Negative.    Allergic/Immunologic: Negative.    Neurological: Negative.    Hematological: Negative.    Psychiatric/Behavioral: Negative.     All other systems reviewed and are negative.        PHYSICAL EXAM:   BP 136/68   Pulse 69   Ht 1.854 m (6' 1)   Wt 122.5 kg (270 lb)   BMI 35.62 kg/m      Wt Readings from Last 3 Encounters:   09/12/23 122.5 kg (270 lb)   08/20/23 122.9 kg (271 lb)   08/19/23 129.3 kg (285 lb)     BP Readings from Last 3 Encounters:   09/12/23 136/68   08/20/23 138/67   08/19/23 130/70       Gen: Well appearing, well developed, no acute distress  Eyes: Pupils equal, round. Extraocular movements are intact  ENT: Oropharynx clear, no oral lesions, normal dentition  CV: S1S2, regular rate and rhythm, no murmurs, rubs or gallops, normal JVD, no carotid bruits, normal distal pulses, no LEE  Pulm: Clear to auscultation bilaterally, no accessory muscle uses, no wheezes or rales  GI: Soft, NT, ND, +BS  Neuro: Alert and oriented, nonfocal  Psych: Appropriate affect  Skin: Normal color and skin turgor  MSK: Normal muscle bulk and tone    Medical problems and test results were reviewed with the patient today.     No results found for any visits on 09/12/23.

## 2023-09-22 ENCOUNTER — Encounter

## 2023-09-24 ENCOUNTER — Encounter: Payer: MEDICARE | Attending: Family Medicine | Primary: Family Medicine

## 2023-09-24 ENCOUNTER — Encounter

## 2023-09-24 MED ORDER — ZOLPIDEM TARTRATE 5 MG PO TABS
5 | ORAL_TABLET | ORAL | 2 refills | Status: DC
Start: 2023-09-24 — End: 2023-11-28

## 2023-09-25 ENCOUNTER — Ambulatory Visit: Admit: 2023-09-25 | Discharge: 2023-09-25 | Payer: MEDICARE | Attending: Family | Primary: Family Medicine

## 2023-09-25 VITALS — BP 134/76 | HR 83 | Temp 98.40000°F | Resp 18 | Ht 73.0 in | Wt 296.7 lb

## 2023-09-25 DIAGNOSIS — G4733 Obstructive sleep apnea (adult) (pediatric): Principal | ICD-10-CM

## 2023-09-29 ENCOUNTER — Ambulatory Visit
Admit: 2023-09-29 | Discharge: 2023-09-29 | Payer: MEDICARE | Attending: Hematology & Oncology | Primary: Family Medicine

## 2023-09-29 ENCOUNTER — Inpatient Hospital Stay: Admit: 2023-09-29 | Payer: MEDICARE | Primary: Family Medicine

## 2023-09-29 DIAGNOSIS — D696 Thrombocytopenia, unspecified: Principal | ICD-10-CM

## 2023-09-29 DIAGNOSIS — Z86718 Personal history of other venous thrombosis and embolism: Principal | ICD-10-CM

## 2023-09-29 DIAGNOSIS — K760 Fatty (change of) liver, not elsewhere classified: Principal | ICD-10-CM

## 2023-09-29 LAB — HEPATITIS B SURFACE ANTIBODY: Hep B S Ab: <3.5 m[IU]/mL

## 2023-09-29 LAB — CBC WITH AUTO DIFFERENTIAL
Basophils %: 0.9 % (ref 0.0–2.0)
Basophils Absolute: 0.06 K/UL (ref 0.00–0.20)
Eosinophils %: 8.3 % — ABNORMAL HIGH (ref 0.5–7.8)
Eosinophils Absolute: 0.58 K/UL (ref 0.00–0.80)
Hematocrit: 46.9 % (ref 41.1–50.3)
Hemoglobin: 16.9 g/dL (ref 13.6–17.2)
Immature Granulocytes %: 0.6 % (ref 0.0–5.0)
Immature Granulocytes Absolute: 0.04 K/UL (ref 0.00–0.50)
Lymphocytes %: 20.8 % (ref 13.0–44.0)
Lymphocytes Absolute: 1.46 K/UL (ref 0.50–4.60)
MCH: 32.9 pg (ref 26.1–32.9)
MCHC: 36 g/dL — ABNORMAL HIGH (ref 31.4–35.0)
MCV: 91.4 FL (ref 82.0–102.0)
MPV: 11.9 FL (ref 9.4–12.3)
Monocytes %: 9.4 % (ref 4.0–12.0)
Monocytes Absolute: 0.66 K/UL (ref 0.10–1.30)
Neutrophils %: 60 % (ref 43.0–78.0)
Neutrophils Absolute: 4.22 K/UL (ref 1.70–8.20)
Platelets: 69 K/uL — ABNORMAL LOW (ref 150–450)
RBC: 5.13 M/uL (ref 4.23–5.6)
RDW: 15.5 % — ABNORMAL HIGH (ref 11.9–14.6)
WBC: 7 K/uL (ref 4.3–11.1)
nRBC: 0 K/uL (ref 0.0–0.2)

## 2023-09-29 LAB — HEPATITIS PANEL, ACUTE
Hep A IgM: NONREACTIVE
Hep B Core Ab, IgM: NONREACTIVE
Hepatitis B Surface Ag: NONREACTIVE
Hepatitis C Ab: NONREACTIVE

## 2023-09-29 LAB — MISCELLANEOUS NEOGENOMICS SENDOUT

## 2023-09-29 LAB — COMPREHENSIVE METABOLIC PANEL
ALT: 42 U/L (ref 8–55)
AST: 46 U/L — ABNORMAL HIGH (ref 15–37)
Albumin/Globulin Ratio: 1.1 (ref 1.0–1.9)
Albumin: 3.9 g/dL (ref 3.2–4.6)
Alk Phosphatase: 68 U/L (ref 40–129)
Anion Gap: 13 mmol/L (ref 7–16)
BUN: 17 mg/dL (ref 8–23)
CO2: 21 mmol/L (ref 20–29)
Calcium: 9.4 mg/dL (ref 8.8–10.2)
Chloride: 101 mmol/L (ref 98–107)
Creatinine: 1.16 mg/dL (ref 0.80–1.30)
Est, Glom Filt Rate: 69 ml/min/1.73m2 (ref >60)
Globulin: 3.5 g/dL (ref 2.3–3.5)
Glucose: 112 mg/dL — ABNORMAL HIGH (ref 70–99)
Potassium: 4.5 mmol/L (ref 3.5–5.1)
Sodium: 135 mmol/L — ABNORMAL LOW (ref 136–145)
Total Bilirubin: 1.3 mg/dL — ABNORMAL HIGH (ref 0.0–1.2)
Total Protein: 7.4 g/dL (ref 6.3–8.2)

## 2023-09-29 LAB — IMMATURE PLATELET FRACTION: Immature Plt. Fraction: 9.1 % — ABNORMAL HIGH (ref 1.8–7.9)

## 2023-09-29 LAB — PROTIME-INR
INR: 1.2
Protime: 15 s — ABNORMAL HIGH (ref 11.3–14.9)

## 2023-09-29 LAB — AFP TUMOR MARKER: AFP-Tumor Marker: <1.82 ng/mL (ref 0.00–8.30)

## 2023-09-29 LAB — HIV 1/2 AG/AB, 4TH GENERATION,W RFLX CONFIRM: HIV 1/2 Interp: NONREACTIVE

## 2023-09-29 LAB — IRON AND TIBC
Iron % Saturation: 42 % (ref 20–50)
Iron: 118 ug/dL — ABNORMAL HIGH (ref 35–100)
TIBC: 282 ug/dL (ref 240–450)
UIBC: 164 ug/dL (ref 112.0–347.0)

## 2023-09-29 LAB — FERRITIN: Ferritin: 74 ng/mL (ref 8–388)

## 2023-09-29 LAB — APTT: APTT: 31 s (ref 23.3–37.4)

## 2023-09-29 LAB — HAPTOGLOBIN: Haptoglobin: 71 mg/dL (ref 30–200)

## 2023-09-29 LAB — LACTATE DEHYDROGENASE: LD: 181 U/L (ref 127–281)

## 2023-09-29 NOTE — Progress Notes (Unsigned)
 Verbal order for Copper and Zinc received from Dr. Nalani with verbal read back to confirm. Order signed and routed to provider for co signature.

## 2023-09-29 NOTE — Progress Notes (Signed)
 [facility]  [address]  [place], [place] [address]  [phone]    Patient Name:  Chad Rosario [facility]  Date of Birth:  October 02, 1955      Office Visit 09/25/2023    CHIEF COMPLAINT:    Chief Complaint   Patient presents with    Sleep Apnea     The patient (or guardian, if applicable) and other individuals in attendance with the patient were advised that Artificial Intelligence will be utilized during this visit to record, process the conversation to generate a clinical note, and support improvement of the AI technology. The patient (or guardian, if applicable) and other individuals in attendance at the appointment consented to the use of AI, including the recording.      History of Present Illness    The patient is a 68 year old male who was initially diagnosed with sleep apnea in 2005. We were unable to obtain a copy of his study. He had a home sleep test on 10/09/2021 with an AHI of 33.1 events per hour and desaturations to 82%. He is prescribed CPAP at 11 cm using a nasal mask. He has 3 machines and uses PAP therapy nightly, including a travel CPAP when out of town.    He reports dissatisfaction with his travel CPAP machine due to issues with the nasal pillow cushion, which he finds uncomfortable and improperly sealed. He has been using a heated hose, which has alleviated dryness. He is considering trying a different headgear and has no issues obtaining supplies from [employer]. He acknowledges the importance of maintaining cleanliness of his equipment and changing filters regularly. He uses his machine every night as he can not sleep without it. If he does not use it, he snores excessively and experiences poor sleep quality. He finds the pressure setting of 11 cm comfortable and prefers the nasal mask that goes around the nose. He continues to take Ambien  nightly, which aids in both falling asleep and maintaining sleep. He occasionally wakes up during the night to use the bathroom, for which he  uses a cane due to neuropathy. He reports no balance issues or falls at night.    Supplemental Information  He had his shoulder replaced and has some circulation issues. He is seeing vascular and had radiofrequency ablation. He went into atrial fibrillation after the shoulder surgery. He had a cardioversion and ablation. He is on Eliquis  and will be seeing a hematologist next week with his hx of DVT.          Epworth Sleepiness Scale      09/25/2023     5:52 AM 09/23/2022    10:53 AM 07/20/2021     8:49 AM   Sleep Medicine   Sitting and reading 1 0 3   Watching TV 1 1 0   Sitting, inactive in a public place (e.g. a theatre or a meeting) 0 0 0   As a passenger in a car for an hour without a break 0 0 0   Lying down to rest in the afternoon when circumstances permit 2 0 3   Sitting and talking to someone 0 0 0   Sitting quietly after a lunch without alcohol 1 0 3   In a car, while stopped for a few minutes in traffic 0 0 0   Epworth Sleepiness Score 5  1 9        Patient-reported          Past Medical History:  Diagnosis Date    CAD (coronary artery disease) 2015    Gout     Hypertension     controlled with meds    Neuropathy May 2019    Osteoarthritis 03/2013    Sleep apnea     cpap at night         Patient Active Problem List   Diagnosis    Obesity (BMI 30-39.9)    History of DVT (deep vein thrombosis)    Hx of CABG    Hypertension    Hyperlipidemia    DOE (dyspnea on exertion)    Thrombocytopenia    Neuropathic pain    Paresthesia    Bilateral leg weakness    Foot drop, bilateral    Cramps of lower extremity    Acute medial meniscal tear    Allergic rhinitis    Ataxia    Calcium oxalate renal stones    Chronic pain disorder    Coronary artery disease involving native coronary artery of native heart without angina pectoris    Deep vein thrombosis (DVT) of femoral vein of right lower extremity (HCC)    Degeneration of intervertebral disc of lumbar region    Colon adenoma    Dyslipidemia    GERD without esophagitis    Gout  of both feet    Idiopathic chronic gout of multiple sites without tophus    H/O seasonal allergies    Idiopathic gout    Idiopathic peripheral neuropathy    IFG (impaired fasting glucose)    Insomnia    Low back pain    Lumbar facet joint pain    Lumbar postlaminectomy syndrome    Lumbar radiculopathy, chronic    Lumbar stenosis    Obstructive sleep apnea syndrome    Primary osteoarthritis of left hip    S/P angioplasty with stent    Unsteadiness on feet    Volume overload    Bilateral lower extremity edema    Atrial fibrillation with RVR (HCC)    Congestive heart failure (HCC)    Persistent atrial fibrillation (HCC)    Chronic heart failure with preserved ejection fraction (HFpEF) (HCC)    Wound dehiscence, surgical    Lower extremity edema    Status post ablation of atrial fibrillation    S/P ablation of atrial flutter    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)    Non-pressure chronic ulcer of lower leg with fat layer exposed, right (HCC)    Polyneuropathy    Medication management    Morbid (severe) obesity due to excess calories (E66.01)    Body mass index [BMI] 37.0-37.9, adult (S31.62)    Thoracic aortic aneurysm without rupture          Past Surgical History:   Procedure Laterality Date    BACK SURGERY  2021    has had 3 different ones last was in 2021    CARDIAC SURGERY      CABG    CHOLECYSTECTOMY      COLONOSCOPY N/A 11/29/2020    COLONOSCOPY POLYPECTOMY HOT SNARE performed by Randalyn Romans, MD at [facility] ENDOSCOPY    EP DEVICE PROCEDURE N/A 05/22/2023    Ablation A-fib w complete ep study performed by Carlon Reyes PARAS, MD at [facility] CARDIAC CATH LAB    EYE SURGERY Left     cataract    FRACTURE SURGERY Left     tib-fib fracture    JOINT REPLACEMENT  Hip - 2018  KNEE ARTHROSCOPY Right     TOTAL HIP ARTHROPLASTY Left            Social History     Socioeconomic History    Marital status: Married     Spouse name: Not on file    Number of children: Not on file    Years of  education: Not on file    Highest education level: Not on file   Occupational History    Not on file   Tobacco Use    Smoking status: Never    Smokeless tobacco: Never   Vaping Use    Vaping status: Never Used   Substance and Sexual Activity    Alcohol use: Yes     Alcohol/week: 12.0 standard drinks of alcohol     Types: 6 Cans of beer, 6 Shots of liquor per week     Comment: socially    Drug use: Never    Sexual activity: Not Currently     Partners: Female   Other Topics Concern    Not on file   Social History Narrative    Not on file     Social Drivers of Health     Financial Resource Strain: Low Risk  (04/09/2022)    Received from [facility], [facility]    Overall Financial Resource Strain (CARDIA)     Difficulty of Paying Living Expenses: Not hard at all   Food Insecurity: No Food Insecurity (05/21/2023)    Hunger Vital Sign     Worried About Running Out of Food in the Last Year: Never true     Ran Out of Food in the Last Year: Never true   Transportation Needs: No Transportation Needs (05/21/2023)    PRAPARE - Therapist, art (Medical): No     Lack of Transportation (Non-Medical): No   Physical Activity: Insufficiently Active (08/05/2023)    Exercise Vital Sign     Days of Exercise per Week: 3 days     Minutes of Exercise per Session: 30 min   Stress: No Stress Concern Present (04/09/2022)    Received from [facility], [facility] of Occupational Health - Occupational Stress Questionnaire     Feeling of Stress : Not at all   Social Connections: Socially Integrated (04/09/2022)    Received from [facility], [facility]    Social Network     How would you rate your social network (family, work, friends)?: Good participation with social networks   Intimate Partner Violence: Not At Risk (04/09/2022)    Received from [facility], [facility]    HITS     Over the last 12 months how often did your partner physically hurt you?: Never     Over the last 12 months  how often did your partner insult you or talk down to you?: Never     Over the last 12 months how often did your partner threaten you with physical harm?: Never     Over the last 12 months how often did your partner scream or curse at you?: Never   Housing Stability: Low Risk  (05/21/2023)    Housing Stability Vital Sign     Unable to Pay for Housing in the Last Year: No     Number of Times Moved in the Last Year: 0     Homeless in the Last Year: No         Family History  Problem Relation Age of Onset    Cancer Mother         Lymp    Stroke Father     Cancer Sister         Breast    Clotting Disorder Maternal Grandfather          Allergies   Allergen Reactions    Hydrocodone-Acetaminophen  Anxiety and Itching    Losartan Shortness Of Breath    Amlodipine Other (See Comments)     Other reaction(s): Other (See Comments)  Fatigue   Fatigue       Rosuvastatin Other (See Comments)     Other reaction(s): Constipation  Fatigue, constipation  Other reaction(s): Fatigue    Atorvastatin Diarrhea    Ezetimibe      Other reaction(s): Constipation, Other (See Comments)  Constipation and decreased urine output  Constipation and decreased urine output  Other reaction(s): Constipation, Urine output low           Current Outpatient Medications   Medication Sig    zolpidem  (AMBIEN ) 5 MG tablet TAKE 1 TABLET BY MOUTH EVERY NIGHT AS NEEDED FOR SLEEP. MAX DAILY AMOUNT: 5 MG    apixaban  (ELIQUIS ) 5 MG TABS tablet Take 1 tablet by mouth 2 times daily    metoprolol  succinate (TOPROL  XL) 25 MG extended release tablet Take 1 tablet by mouth daily    azelastine (ASTEPRO) 137 MCG/SPRAY nasal spray USE 1 TO 2 SPRAYS IN EACH NOSTRIL TWICE DAILY AS NEEDED AS DIRECTED    lisinopril  (PRINIVIL ;ZESTRIL ) 20 MG tablet Take 2 tablets by mouth daily    furosemide  (LASIX ) 40 MG tablet Take 1 tablet by mouth daily as needed (take one tablet daily for weight garin greater than 2 lbs in 24 hours or 4 lbs in 48 hours.)    Cyanocobalamin (VITAMIN B 12 PO) Take  by mouth every morning    allopurinol  (ZYLOPRIM ) 300 MG tablet TAKE 1 TABLET BY MOUTH DAILY    Multiple Vitamins-Minerals (THERAPEUTIC MULTIVITAMIN-MINERALS) tablet Take 1 tablet by mouth daily    vitamin D3 (CHOLECALCIFEROL ) 10 MCG (400 UNIT) TABS tablet Take 2 tablets by mouth daily    CPAP Machine MISC by Does not apply route    colchicine  (MITIGARE ) 0.6 MG capsule Take 1 capsule by mouth as needed for Pain    fexofenadine (ALLEGRA) 180 MG tablet Take 1 tablet by mouth daily    doxycycline  hyclate (VIBRAMYCIN ) 100 MG capsule Take 1 capsule by mouth 2 times daily (Patient not taking: Reported on 09/25/2023)    gabapentin  (NEURONTIN ) 600 MG tablet Take 1 tablet by mouth in the morning, at noon, in the evening, and at bedtime.     No current facility-administered medications for this visit.           REVIEW OF SYSTEMS:   CONSTITUTIONAL:   There is no history of fever, chills, night sweats, weight loss, weight gain, persistent fatigue, or lethargy/hypersomnolence.   CARDIAC:   No chest pain, pressure, discomfort, palpitations, orthopnea, murmurs, or edema.   GI:   No dysphagia, heartburn reflux, nausea/vomiting, diarrhea, abdominal pain, or bleeding.   NEURO:   There is no history of AMS, persistent headache, decreased level of consciousness, seizures, or motor or sensory deficits.      PHYSICAL EXAM:    Vitals:    09/25/23 1001   BP: 134/76   Pulse: 83   Resp: 18   Temp: 98.4 F (36.9 C)   TempSrc: Temporal   SpO2: 96%  Weight: 134.6 kg (296 lb 11.2 oz)   Height: 1.854 m (6' 1)        Body mass index is 39.14 kg/m.         GENERAL APPEARANCE:   The patient is obese  and in no respiratory distress.   HEENT:   PERRL.  Conjunctivae unremarkable.   Nasal mucosa is without epistaxis, exudate, or polyps.    NECK/LYMPHATIC:   Symmetrical with no elevation of jugular venous pulsation.  Trachea midline. No thyroid enlargement.  No cervical adenopathy.   LUNGS:   Normal respiratory effort with symmetrical lung expansion.    Breath sounds clear bilaterally.   HEART:   There is a regular rate and rhythm.  No murmur, rub, or gallop.  There is no edema in the lower extremities.   ABDOMEN:   Soft and non-tender.  No hepatosplenomegaly.  Bowel sounds are normal.     NEURO:   The patient is alert and oriented to person, place, and time.  Memory appears intact and mood is normal.  No gross sensorimotor deficits are present.          ASSESSMENT:  (Medical Decision Making)      Diagnosis Orders   1. OSA (obstructive sleep apnea)  DME - DURABLE MEDICAL EQUIPMENT   He continues to use and benefit from PAP therapy.  Continue current settings with nightly compliance  He will discuss with [employer] if he can find a different nasal mask for his travel machine     2. Nocturnal hypoxemia  DME - DURABLE MEDICAL EQUIPMENT   Improved with treatment of sleep apnea        PLAN:  Continue CPAP 11 cm with nightly compliance  Renew supplies  Follow-up will be in 1 year or sooner if needed    Orders Placed This Encounter   Procedures    DME - DURABLE MEDICAL EQUIPMENT     [facility]  Phone: [address]  [place] [address]  Dept: [phone]      Patient Name: Chad Rosario [facility]  DOB: Oct 05, 1955  Gender: male  Address: [address]  [place] [address]   Patient phone: [phone] (home) [phone] (work)      Editor, commissioning: Payor: MEDICARE / Plan: MEDICARE PART A AND B / Product Type: *No Product type* /   Subscriber ID: [id number] - (Medicare)      AMB Supply Order  Order Details     DME Location:   Order Date: 09/25/2023   There were no encounter diagnoses.             (  X   )Supplies Needed       cpap Machine   (     [phone] CPAP Unit  (     [phone] Auto CPAP Unit  (     )E0470 BiLevel Unit  (     )E0470 Auto BiLevel Unit  (     )E0471 ASV        (     )E0471 Bilevel ST      Length of need: 12 months    Pressure:  11 cmH20  EPR:     Starting Ramp Pressure:   cm H20  Ramp Time: min        Patient had  a diagnostic Apnea Hypopnea Index (AHI) of :    *SUPPLIES* Replace all as needed, or per coverage guidelines  Masks Type:  (    ) A7030-Full Face Mask (1 per 3 mon)  (    ) A7031-Full Mask (1 per month) Interface/Cushion      ( x ) A7034-Nasal Mask (1 per 3 mon)  ( x  ) J2967- Nasal Mask (1 per month) Interface/Cushion  (  x   ) A7033-Pillow (2 per mon)  (     ) A7036-Chinstrap (1 per 6 mon)            Other Supplies:    (   X  )A7035-Headgear (1 per 6 mon)  (   X  )A4604-Heated Tubing (1 per 3 mon)  (   X  )J2961- Disposable Filter (2 per mon)  (   x  )E0562-Heated Humidifier (1 per year)     (  x   )A7036-Chinstrap (sometimes used with Full Face Mask) (1 per 6 mos)  (    )A7037-Tubing-without heat (1 per 3 mos)  (     )A7039-Non-Disposable Filter (1 per 6 mos)  (  x   )A7046-Water Chamber (1 per 6 mos)  (     )E0561-Humidifier non-heated (1 per 5 yrs)      Signed Date: 09/25/2023  Electronically Signed By: Armida DELENA Moles, APRN - CNP  Electronically Dated:  09/25/2023     No orders of the defined types were placed in this encounter.        Collaborating Physician: Dr. Raymona    Today's office visit was spent  reviewing test results, prognosis, importance of compliance, education about disease process, benefits of medications, instructions for management of acute flare-ups, and follow up plans.  Total time spent with patient was 20 minutes.        Armida DELENA Moles, APRN - CNP  Electronically signed

## 2023-09-29 NOTE — Patient Instructions (Signed)
 Patient Information from Today's Visit    Diagnosis: History of DVT; Thrombocytopenia      Follow Up Instructions: Virtual visit in a few weeks.    Labs reviewed.  Symptoms reviewed.  We will order additional labs.  Ultrasound of abdomen ordered.  You can call to schedule this at [phone].      Treatment Summary has been discussed and given to patient: N/A      Current Labs:   Hospital Outpatient Visit on 09/29/2023   Component Date Value Ref Range Status    Sodium 09/29/2023 135 (L)  136 - 145 mmol/L Final    Potassium 09/29/2023 4.5  3.5 - 5.1 mmol/L Final    Chloride 09/29/2023 101  98 - 107 mmol/L Final    CO2 09/29/2023 21  20 - 29 mmol/L Final    Anion Gap 09/29/2023 13  7 - 16 mmol/L Final    Glucose 09/29/2023 112 (H)  70 - 99 mg/dL Final    Comment: <29 mg/dL Consistent with, but not fully diagnostic of hypoglycemia.  100 - 125 mg/dL Impaired fasting glucose/consistent with pre-diabetes mellitus.  > 126 mg/dl Fasting glucose consistent with overt diabetes mellitus      BUN 09/29/2023 17  8 - 23 MG/DL Final    Creatinine 92/92/7974 1.16  0.80 - 1.30 MG/DL Final    Est, Glom Filt Rate 09/29/2023 69  >60 ml/min/1.51m2 Final    Comment:    Pediatric calculator link: https://www.kidney.org/professionals/kdoqi/gfr_calculatorped     These results are not intended for use in patients <55 years of age.     eGFR results are calculated without a race factor using  the 2021 CKD-EPI equation. Careful clinical correlation is recommended, particularly when comparing to results calculated using previous equations.  The CKD-EPI equation is less accurate in patients with extremes of muscle mass, extra-renal metabolism of creatinine, excessive creatine ingestion, or following therapy that affects renal tubular secretion.      Calcium 09/29/2023 9.4  8.8 - 10.2 MG/DL Final    Total Bilirubin 09/29/2023 1.3 (H)  0.0 - 1.2 MG/DL Final    ALT 92/92/7974 42  8 - 55 U/L Final    AST 09/29/2023 46 (H)  15 - 37 U/L Final    Alk  Phosphatase 09/29/2023 68  40 - 129 U/L Final    Total Protein 09/29/2023 7.4  6.3 - 8.2 g/dL Final    Albumin 92/92/7974 3.9  3.2 - 4.6 g/dL Final    Globulin 92/92/7974 3.5  2.3 - 3.5 g/dL Final    Albumin/Globulin Ratio 09/29/2023 1.1  1.0 - 1.9   Final    WBC 09/29/2023 7.0  4.3 - 11.1 K/uL Final    RBC 09/29/2023 5.13  4.23 - 5.6 M/uL Final    Hemoglobin 09/29/2023 16.9  13.6 - 17.2 g/dL Final    Hematocrit 92/92/7974 46.9  41.1 - 50.3 % Final    MCV 09/29/2023 91.4  82.0 - 102.0 FL Final    MCH 09/29/2023 32.9  26.1 - 32.9 PG Final    MCHC 09/29/2023 36.0 (H)  31.4 - 35.0 g/dL Final    RDW 92/92/7974 15.5 (H)  11.9 - 14.6 % Final    Platelets 09/29/2023 69 (L)  150 - 450 K/uL Final    MPV 09/29/2023 11.9  9.4 - 12.3 FL Final    nRBC 09/29/2023 0.00  0.0 - 0.2 K/uL Final    **Note: Absolute NRBC parameter is now reported with Hemogram**    Neutrophils %  09/29/2023 60.0  43.0 - 78.0 % Final    Lymphocytes % 09/29/2023 20.8  13.0 - 44.0 % Final    Monocytes % 09/29/2023 9.4  4.0 - 12.0 % Final    Eosinophils % 09/29/2023 8.3 (H)  0.5 - 7.8 % Final    Basophils % 09/29/2023 0.9  0.0 - 2.0 % Final    Immature Granulocytes % 09/29/2023 0.6  0.0 - 5.0 % Final    Neutrophils Absolute 09/29/2023 4.22  1.70 - 8.20 K/UL Final    Lymphocytes Absolute 09/29/2023 1.46  0.50 - 4.60 K/UL Final    Monocytes Absolute 09/29/2023 0.66  0.10 - 1.30 K/UL Final    Eosinophils Absolute 09/29/2023 0.58  0.00 - 0.80 K/UL Final    Basophils Absolute 09/29/2023 0.06  0.00 - 0.20 K/UL Final    Immature Granulocytes Absolute 09/29/2023 0.04  0.00 - 0.50 K/UL Final    Differential Type 09/29/2023 AUTOMATED    Final                 Please refer to After Visit Summary or MyChart for upcoming appointment information. If you have any questions regarding your upcoming schedule please reach out to your care team through MyChart or call [phone].    Please notify your assigned Nurse Navigator of any unplanned hospital admissions or Emergency  Room visits within 24 hours of discharge.    -------------------------------------------------------------------------------------------------------------------  Please call our office at [phone] if you have any  of the following symptoms:   Fever of 100.5 or greater  Chills  Shortness of breath  Swelling or pain in one leg    After office hours an answering service is available and will contact a provider for emergencies or if you are experiencing any of the above symptoms.        JOHANA POSTAL, RN

## 2023-09-29 NOTE — Progress Notes (Signed)
 [facility] Hematology and Oncology: Established patient - follow up     Chief Complaint   Patient presents with    Follow-up     History of Present Illness:  Chad Rosario is a 68 y.o. male who presents today in referral from Dr. Lajoyce.  The past medical history is significant for CAD, s/p CABG x2 in 2015 on Eliquis , HLD, secondary polycythemia, DVT, fatty liver, idiopathic neuropathy, colon polyps (last colonoscopy 11/2020, path returned as mixed tubulovillous adenoma), sleep apnea, significant degenerative disc disease in his back status post multiple surgeries to address.  06/07/2021 he underwent PTV/PopV recanalization with PA (Dr. Rox).     The patient (or guardian, if applicable) and other individuals in attendance with the patient were advised that Artificial Intelligence will be utilized during this visit to record, process the conversation to generate a clinical note, and support improvement of the AI technology. The patient (or guardian, if applicable) and other individuals in attendance at the appointment consented to the use of AI, including the recording.    09/29/23  History of Present Illness  The patient presents for evaluation of thrombocytopenia on [facility] for hx of DVT, currently on Eliquis .  He reports a history of deep vein thrombosis (DVT) approximately 10 years ago, which was associated with pain, warmth, and redness. 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 He also has a history of heart disease and has undergone double bypass surgery. He is currently on Eliquis  and is eager to discontinue it.  He has been experiencing balance issues due to neuropathy for the past 3 years, which worsened over the last year. He does not experience any pain. The cause of his neuropathy is unknown. He consulted a neurologist and was dx'ed with idiopathic neuropathy. He has undergone conduction studies. He retains sensation and can  feel pressure, but his fine motor skills are impaired. He has been taking a nerve supplement, which improved his foot movement. He also has foot drop. He had a leg fracture and compartment syndrome, which led to his current condition.  He was informed that he may have fatty liver. This was first mentioned to him in 2007 when he was hospitalized for leg fracture. He underwent a FibroScan in 12/2022, which revealed mild liver fibrosis. He has been under the care of Dr. Roblin, a gastroenterologist. His bilirubin levels have been elevated since 1992. He had an ultrasound of his liver and spleen, which showed slight enlargement. He does not have a gallbladder.  On evaluation today, he denies lower extremity pain or new swelling.  No chest pain, worsening shortness of breath, bleeding or dark stools, abdominal pain, NVD.  No B symptoms.  No falls, head trauma.  He has chronic leg weakness and due to neuropathy, needs to focus to walk/prevent tripping.      SOCIAL HISTORY  The patient drinks alcohol, typically three or four drinks on Fridays and Saturdays, and occasionally on Sundays.    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 OVERALL, THE   FEATURES ARE MOST IN KEEPING WITH SECONDARY ERYTHROCYTOSIS.   11/29/2014.      DVT RLE 7/17  There was not enough blood to do the complete  antiphospholipid testing. The lupus anticoagulant was positive which may be artifactual related to the presence of xarelto . Both factor V Leiden and the prothrombin gene mutation were absent     05/28/22: six month follow up. okay overall. recent left knee surgery. 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 seeing vascular soon.  denies chest pain, dizziness, nausea, vomiting, fever, black or bloody stools, spontaneous bleeding.  tolerating Eliquis  5 mg BID but not affordable and switching back to Xarelto .  Hgb remains mildly elevated but stable. Platetlets 64,000 which is low but stable from September/November results. to hold [facility] if/when plts <50K.  Most likely cause of thrombocytopenia is liver disease - encouraged weight loss and avoidance of liver toxic substances like Tylenol /alcohol.   11/28/22 - switched from Eliquis  to Xarelto  and has been taking the medication compliantly at 20 mg p.o. daily.  In last few weeks, noted increased bruising but denies spontaneous bleeding.  denies any pain in legs.  evaluated by Dr. Rox in March.  Venous ultrasound RLE noted occluded femoral vein, it was open a year ago.  Due to chronically occluded right popliteal vein, access to the occluded femoral vein would be challenging.  Any intervention has therefore been deferred until a point when he develops worsening symptoms (swelling, pain, discoloration).  His platelet count has been trending down slowly and is 58,000.  Due to increased bruising, reduced dose of Xarelto  to 10 mg daily.  dose may need to be increased back to 20 mg daily, should he develop any acute thrombotic event.  Likely etiology for thrombocytopenia is liver disease/splenomegaly.  Patient reports a history of binge drinking until his 80s.  Cautioned  against alcohol consumption.  Advised avoidance of acetaminophen .  Rec ultrasound liver/spleen for further evaluation and  likely refer him back to GI.    01/30/23 - underwent right shoulder revision arthroplasty and developed joint space infection. He received i.v. antibiotics and was on long term oral abx.    03/2023 FU with Dr Hildegard     09/29/23 first time seeing pt for evaluation     Family History   Problem Relation Age of Onset    Cancer Mother         Lymp    Stroke Father     Cancer Sister         Breast    Clotting Disorder Maternal Grandfather       Social History     Socioeconomic History    Marital status: Married     Spouse name: None    Number of children: None    Years of education: None    Highest education level: None   Tobacco Use    Smoking status: Never    Smokeless tobacco: Never   Vaping Use    Vaping status: Never Used   Substance and Sexual Activity    Alcohol use: Yes     Alcohol/week: 12.0 standard drinks of alcohol     Types: 6 Cans of beer, 6 Shots  of liquor per week     Comment: socially    Drug use: Never    Sexual activity: Not Currently     Partners: Female     Social Drivers of Health     Financial Resource Strain: Low Risk  (04/09/2022)    Received from [facility], [facility]    Overall Financial Resource Strain (CARDIA)     Difficulty of Paying Living Expenses: Not hard at all   Food Insecurity: No Food Insecurity (05/21/2023)    Hunger Vital Sign     Worried About Running Out of Food in the Last Year: Never true     Ran Out of Food in the Last Year: Never true   Transportation Needs: No Transportation Needs (05/21/2023)    PRAPARE - Therapist, art (Medical): No     Lack of Transportation (Non-Medical): No   Physical Activity: Insufficiently Active (08/05/2023)    Exercise Vital Sign     Days of Exercise per Week: 3 days     Minutes of Exercise per Session: 30 min   Stress: No Stress Concern Present (04/09/2022)    Received from [facility], [facility] - Occupational Stress Questionnaire     Feeling of Stress : Not at all   Social  Connections: Socially Integrated (04/09/2022)    Received from [facility], [facility]    Social Network     How would you rate your social network (family, work, friends)?: Good participation with social networks   Intimate Partner Violence: Not At Risk (04/09/2022)    Received from [facility], [facility]    HITS     Over the last 12 months how often did your partner physically hurt you?: Never     Over the last 12 months how often did your partner insult you or talk down to you?: Never     Over the last 12 months how often did your partner threaten you with physical harm?: Never     Over the last 12 months how often did your partner scream or curse at you?: Never   Housing Stability: Low Risk  (05/21/2023)    Housing Stability Vital Sign     Unable to Pay for Housing in the Last Year: No     Number of Times Moved in the Last Year: 0     Homeless in the Last Year: No      Review of Systems   Constitutional:  Positive for fatigue. Negative for appetite change, diaphoresis, fever and unexpected weight change.   HENT:   Negative for sore throat.    Eyes:  Negative for eye problems and icterus.   Respiratory:  Negative for cough, hemoptysis and shortness of breath.    Cardiovascular:  Negative for chest pain, leg swelling and palpitations.   Gastrointestinal:  Negative for abdominal distention, abdominal pain, blood in stool, constipation, diarrhea, nausea and vomiting.   Endocrine: Negative for hot flashes.   Genitourinary:  Negative for dysuria.    Musculoskeletal:  Positive for arthralgias, gait problem and myalgias. Negative for back pain.   Skin:  Negative for itching, rash and wound.   Neurological:  Positive for gait problem and numbness. Negative for dizziness, extremity weakness, headaches, light-headedness, seizures and speech difficulty.   Hematological:  Negative for adenopathy. Does not bruise/bleed easily.   Psychiatric/Behavioral:  Positive for sleep disturbance. Negative for decreased  concentration and depression. The  patient is not nervous/anxious.       Allergies   Allergen Reactions    Hydrocodone-Acetaminophen  Anxiety and Itching    Losartan Shortness Of Breath    Amlodipine Other (See Comments)     Other reaction(s): Other (See Comments)  Fatigue   Fatigue       Rosuvastatin Other (See Comments)     Other reaction(s): Constipation  Fatigue, constipation  Other reaction(s): Fatigue    Atorvastatin Diarrhea    Ezetimibe      Other reaction(s): Constipation, Other (See Comments)  Constipation and decreased urine output  Constipation and decreased urine output  Other reaction(s): Constipation, Urine output low       Past Medical History:   Diagnosis Date    CAD (coronary artery disease) 2015    Gout     Hypertension     controlled with meds    Neuropathy May 2019    Osteoarthritis 03/2013    Sleep apnea     cpap at night     Past Surgical History:   Procedure Laterality Date    BACK SURGERY  2021    has had 3 different ones last was in 2021    CARDIAC SURGERY      CABG    CHOLECYSTECTOMY      COLONOSCOPY N/A 11/29/2020    COLONOSCOPY POLYPECTOMY HOT SNARE performed by Randalyn Romans, MD at [facility] ENDOSCOPY    EP DEVICE PROCEDURE N/A 05/22/2023    Ablation A-fib w complete ep study performed by Carlon Reyes PARAS, MD at [facility] CARDIAC CATH LAB    EYE SURGERY Left     cataract    FRACTURE SURGERY Left     tib-fib fracture    JOINT REPLACEMENT  Hip - 2018    KNEE ARTHROSCOPY Right     TOTAL HIP ARTHROPLASTY Left      Current Outpatient Medications   Medication Sig Dispense Refill    zolpidem  (AMBIEN ) 5 MG tablet TAKE 1 TABLET BY MOUTH EVERY NIGHT AS NEEDED FOR SLEEP. MAX DAILY AMOUNT: 5 MG 30 tablet 2    apixaban  (ELIQUIS ) 5 MG TABS tablet Take 1 tablet by mouth 2 times daily      metoprolol  succinate (TOPROL  XL) 25 MG extended release tablet Take 1 tablet by mouth daily 90 tablet 3    azelastine (ASTEPRO) 137 MCG/SPRAY nasal spray USE 1 TO 2 SPRAYS IN EACH NOSTRIL TWICE DAILY AS NEEDED AS DIRECTED       lisinopril  (PRINIVIL ;ZESTRIL ) 20 MG tablet Take 2 tablets by mouth daily 180 tablet 1    furosemide  (LASIX ) 40 MG tablet Take 1 tablet by mouth daily as needed (take one tablet daily for weight garin greater than 2 lbs in 24 hours or 4 lbs in 48 hours.) 30 tablet 3    Cyanocobalamin (VITAMIN B 12 PO) Take by mouth every morning      allopurinol  (ZYLOPRIM ) 300 MG tablet TAKE 1 TABLET BY MOUTH DAILY 90 tablet 3    gabapentin  (NEURONTIN ) 600 MG tablet Take 1 tablet by mouth in the morning, at noon, in the evening, and at bedtime. 360 tablet 3    Multiple Vitamins-Minerals (THERAPEUTIC MULTIVITAMIN-MINERALS) tablet Take 1 tablet by mouth daily      vitamin D3 (CHOLECALCIFEROL ) 10 MCG (400 UNIT) TABS tablet Take 2 tablets by mouth daily      CPAP Machine MISC by Does not apply route      colchicine  (MITIGARE ) 0.6 MG capsule Take 1 capsule by mouth  as needed for Pain 30 capsule 3    fexofenadine (ALLEGRA) 180 MG tablet Take 1 tablet by mouth daily       No current facility-administered medications for this visit.     OBJECTIVE:  BP (!) 156/66 (BP Site: Right Upper Arm, Patient Position: Sitting)   Pulse 91   Temp 98.6 F (37 C) (Oral)   Resp 16   Wt 134.3 kg (296 lb)   SpO2 96%   BMI 39.05 kg/m     ECOG PERFORMANCE STATUS - 2 - Ambulatory and capable of all selfcare but unable to carry out any work activities. Up and about more than 50% of waking hours.  Pain - 0 - No pain/10. None/Minimal pain - not affecting QOL     Physical Exam  Vitals reviewed.   Constitutional:       General: He is not in acute distress.     Appearance: Normal appearance. He is obese. He is not ill-appearing.   HENT:      Head: Normocephalic and atraumatic.      Nose: Nose normal. No congestion.      Mouth/Throat:      Mouth: Mucous membranes are moist.   Eyes:      General: No scleral icterus.     Extraocular Movements: Extraocular movements intact.      Conjunctiva/sclera: Conjunctivae normal.      Pupils: Pupils are equal, round, and  reactive to light.   Cardiovascular:      Rate and Rhythm: Normal rate and regular rhythm.      Heart sounds: No murmur heard.  Pulmonary:      Effort: Pulmonary effort is normal. No respiratory distress.      Breath sounds: Normal breath sounds. No wheezing, rhonchi or rales.   Abdominal:      General: There is no distension.      Palpations: Abdomen is soft. There is no mass.      Tenderness: There is no abdominal tenderness. There is no guarding or rebound.   Musculoskeletal:      Cervical back: Normal range of motion. No rigidity.   Lymphadenopathy:      Cervical: No cervical adenopathy.      Upper Body:      Right upper body: No supraclavicular or axillary adenopathy.      Left upper body: No supraclavicular or axillary adenopathy.   Skin:     General: Skin is warm and dry.      Coloration: Skin is not jaundiced or pale.      Comments: Post surgical changes in LE    Neurological:      General: No focal deficit present.      Mental Status: He is alert and oriented to person, place, and time.      Motor: No weakness.      Coordination: Coordination normal.      Gait: Gait abnormal.   Psychiatric:         Behavior: Behavior normal.         Thought Content: Thought content normal.        Labs:  No results found for this or any previous visit (from the past week).    Imaging: reviewed   PATHOLOGY:         ASSESSMENT:   Diagnosis Orders   1. Thrombocytopenia, unspecified  Erythropoietin    Ferritin    Iron and TIBC    ANA, Direct, w/Reflex    Hepatitis B Surface  Antibody    Hepatitis Panel, Acute    HIV 1/2 Ag/Ab, 4TH Generation,W Rflx Confirm    APTT    Protime-INR    Miscellaneous [employer] SendOut    Lactate Dehydrogenase    Haptoglobin    Immature Platelet Fraction      2. History of DVT (deep vein thrombosis)  Erythropoietin    Ferritin    Iron and TIBC    ANA, Direct, w/Reflex    Hepatitis B Surface Antibody    Hepatitis Panel, Acute    HIV 1/2 Ag/Ab, 4TH Generation,W Rflx Confirm    APTT    Protime-INR     Miscellaneous [employer] SendOut    Lactate Dehydrogenase    Haptoglobin      3. Nutritional deficiency  Ferritin    Iron and TIBC    Copper, Serum    Zinc      4. Hepatic steatosis  AFP Tumor Marker    US  ABDOMEN COMPLETE      5. Elevated LFTs  AFP Tumor Marker    US  ABDOMEN COMPLETE      [name] is here for evaluation     1. 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  Elevated bilirubin levels suggest liver dz/Gilbert's disease.  Prev referred to GI for eval and fatty liver was confirmed.    - needs monitoring of TCP as on AC.  AC should be held when plts <50K or with active bleeding.    - A comprehensive lab workup will be conducted today, including tests for autoimmune causes, viral serologies, and [employer]. AFP will be checked.    - total abdominal ultrasound will be ordered to assess the size of the spleen and liver.  Prev liver at 20-21cm and spleen 16cm.      2. 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 Rox could not perform further interventions due to the occlusion; communicating w DR Rox re pt's case   - no new clotting issues in the arms or legs. Swelling persists without pain.     3. Neuropathy.  Neuropathy causing balance issues has worsened over the past year. Conduction studies confirmed neuropathy. Currently taking a nerve supplement that has improved symptoms.  To FU with neuro per their recs.      4. Fatty liver/hepatosplenomegaly - drinks alcohol and hx of fatty liver   - FibroScan showing mild fibrosis (F1), fatty liver and organomegaly.  - Liver function tests show mildly increased AST and elevated bilirubin. Alcohol consumption  can be contributing to liver abnormalities. abdominal ultrasound will be ordered to reassess the liver and spleen.  Advised to avoid alcohol.    - organomegaly contributes to sequestration of wbc/plts - leading to cytopenias.       Follow-up: A virtual visit will be scheduled in a few weeks to discuss the results of these tests.    [42min - chart review, review of results and discussion of options, next steps, coordination of care, communication with other MD and charting ]    RESUSCITATION DIRECTIVES/HOSPICE CARE: Full Support    MDM     Amount and/or Complexity of Data Reviewed  Clinical lab tests: ordered and reviewed  Tests in  the radiology section of CPT: ordered and reviewed  Tests in the medicine section of CPT: ordered  Review and summarize past medical records: yes  Discuss the patient with other providers: yes  Independent visualization of images, tracings, or specimens: yes    Risk of Complications, Morbidity, and/or Mortality  Presenting problems: moderate  Diagnostic procedures: moderate  Management options: high      Lab studies and imaging studies were personally reviewed.  Pertinent old records were reviewed.     Historical:    All questions were asked and answered to the best of my ability.  The patient verbalized understanding and agrees with the plan above.      Documentation was sent to Dr. Lajoyce for continuation of care.  Thank you, Dr. Lajoyce, for the courtesy of this referral.    Asia Kathryne) Nalani, MD  [facility]  [address]  [place], [place] [address]  Office : [phone]  Fax : [phone]

## 2023-09-30 LAB — ZINC: Zinc: 63 ug/dL (ref 44–115)

## 2023-09-30 LAB — COPPER, SERUM: Copper: 82 ug/dL (ref 69–132)

## 2023-09-30 LAB — PERIPHERAL BLOOD SMEAR, PATH REVIEW

## 2023-09-30 LAB — ERYTHROPOIETIN: ERYTHROPOIETIN: 28.4 m[IU]/mL — ABNORMAL HIGH (ref 2.6–18.5)

## 2023-09-30 LAB — ANA, DIRECT, W/REFLEX: ANA: NEGATIVE

## 2023-10-07 ENCOUNTER — Inpatient Hospital Stay: Admit: 2023-10-07 | Payer: MEDICARE | Attending: Hematology & Oncology | Primary: Family Medicine

## 2023-10-07 DIAGNOSIS — K760 Fatty (change of) liver, not elsewhere classified: Principal | ICD-10-CM

## 2023-10-09 ENCOUNTER — Inpatient Hospital Stay: Admit: 2023-10-09 | Discharge: 2023-10-09 | Payer: MEDICARE | Primary: Family Medicine

## 2023-10-09 VITALS — BP 124/70 | HR 94 | Ht 73.0 in | Wt 291.0 lb

## 2023-10-09 DIAGNOSIS — I87311 Chronic venous hypertension (idiopathic) with ulcer of right lower extremity: Principal | ICD-10-CM

## 2023-10-09 NOTE — Assessment & Plan Note (Addendum)
 Assessment  Patient last seen on 08/20/2023; he returns today because his wound never fully healed  He reports he has not been wearing compression and has been leaving wound open to air  Wound bed is pink, slightly hypergranular tissue with a layer of biofilm/exudate; leg is generally edematous  No periwound induration or erythema  Plan  Excisional debridement remove devitalized tissue and promote wound healing  Discussed in depth the role of compression and wound healing; reinforced that this wound likely will not heal without adequate compression  Wound care: Vashe cleanse, Xeroform gauze, 30 to 40 mmHg multilayer compression, change weekly  Patient encouraged to elevate leg when able  RTC 1 week

## 2023-10-09 NOTE — Other (Signed)
 10/09/23 1417   Wound 10/09/23 Pretibial Right #1   Date First Assessed/Time First Assessed: 10/09/23 1416   Present on Original Admission: Yes  Wound Approximate Age at First Assessment (Weeks): 2 weeks  Primary Wound Type: Vascular Ulcer  Secondary Wound Type - Vascular Ulcer: Venous  Location: Preti...   Wound Image    Wound Etiology Venous   Dressing Status Clean;Dry;Intact   Wound Cleansed Cleansed with saline   Dressing/Treatment Xeroform   Wound Length (cm) 0.4 cm   Wound Width (cm) 0.5 cm   Wound Depth (cm) 0.1 cm   Wound Surface Area (cm^2) 0.2 cm^2   Wound Volume (cm^3) 0.02 cm^3   Wound Assessment Hyper granulation tissue   Drainage Amount Small (< 25%)   Drainage Description Serosanguinous   Odor None   Peri-wound Assessment Edematous   Wound Thickness Description not for Pressure Injury Full thickness     Patient is currently taking eliquis  daily

## 2023-10-09 NOTE — Progress Notes (Signed)
 [facility]  History and Physical Note   Referring Provider: Mliss Carol, MD     Chad Rosario [facility]  MEDICAL RECORD NUMBER: [id number]  AGE: 68 y.o.     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, right (HCC)  Assessment  Patient last seen on 08/20/2023; he returns today because his wound never fully healed  He reports he has not been wearing compression and has been leaving wound open to air  Wound bed is pink, slightly hypergranular tissue with a layer of biofilm/exudate; leg is generally edematous  No periwound induration or erythema  Plan  Excisional debridement remove devitalized tissue and promote wound healing  Discussed in depth the role of compression and wound healing; reinforced that this wound likely will not heal without adequate compression  Wound care: Vashe cleanse, Xeroform gauze, 30 to 40 mmHg multilayer compression, change weekly  Patient encouraged to elevate leg when able  RTC 1 week    Medical decision making:  Assessment required other independent historian(s): N/A.  Comorbid conditions affecting wound healing as noted in PMH and PSH reviewed for this visit.  Review of medical records and external note from other providers reviewed for this visit.  Pertinent diagnostics were reviewed for this visit. Discussed management or test interpretation with other qualified health care professional.  Prescription drug management: N/A.    Risk of complications and/or mortality of treatment plan:  The patient has a moderate risk of morbidity and mortality from additional diagnostic testing or treatment. This is due to conditions that affect wound healing as well as patient and procedure risk factors. Patient/caregiver educated and given the opportunity to ask questions. They agree to treatment plan. Pertinent decisions include: minor surgery or procedures as  below.   The patient's diagnosis and/or treatment is significantly limited by the following social drivers of health: N/A.    Procedure(s) performed this visit:  Procedure Note: Excisional Debridement  Indications: Based on my examination of the patient's wound(s) today, debridement IS required to remove devitalized tissue, evaluate the wound base, and promote wound healing.  - Performed by: Damien Sherre Moles, APRN - NP  - Consent obtained: YES  - Time out taken: YES  - Pain control:     - Wound #: 1  - Diabetic/pressure/chronic non-pressure ulcers only: non-pressure ulcer, fat layer exposed was/were debrided using curette. The wound(s) was/were debrided down through/to subcutaneous tissue. Devitalized tissue debrided: fibrin, biofilm, and exudate.  - Pre and post debridement measurements: see flow sheet  - Total surface area debrided: 0.2 sq cm   - Estimated blood loss: minimal amount blood loss  - Hemostasis achieved: by pressure  - Procedural pain: 0 / 10   - Post procedural pain: 0 / 10   - Response to treatment: Patient tolerated procedure well with no complaints of pain.    Discussed with patient/caregiver factors that negatively affect wound healing: swelling and infection. Discussed with patient/caregiver ways to modify these factors: compression and keeping wound clean and covered. Discussed the benefit of serial debridements. Discussed signs of infection (warmth, redness, swelling, pain, fever >100.5, body aches, chills, loss of appetite, etc.), how and when to contact clinic, when to seek treatment in the ED. Questions asked and answered. Written instructions provided to patient/caregiver. Patient/caregiver voiced understanding of the importance of adhering to instructions for wound healing/best outcome.  Puncture 05/22/23 Femoral (Active)   Number of days: 139       Puncture 05/22/23 Femoral (Active)   Number of days: 139       Wound 06/07/21 Knee Right;Posterior (Active)   Number of days: 854        Wound 10/09/23 Pretibial Right #1 (Active)   Wound Image    10/09/23 1417   Wound Etiology Venous 10/09/23 1417   Dressing Status Clean;Dry;Intact 10/09/23 1417   Wound Cleansed Cleansed with saline 10/09/23 1417   Dressing/Treatment Xeroform 10/09/23 1417   Wound Length (cm) 0.4 cm 10/09/23 1417   Wound Width (cm) 0.5 cm 10/09/23 1417   Wound Depth (cm) 0.1 cm 10/09/23 1417   Wound Surface Area (cm^2) 0.2 cm^2 10/09/23 1417   Wound Volume (cm^3) 0.02 cm^3 10/09/23 1417   Post-Procedure Length (cm) 0.4 cm 10/09/23 1417   Post-Procedure Width (cm) 0.5 cm 10/09/23 1417   Post-Procedure Depth (cm) 0.1 cm 10/09/23 1417   Post-Procedure Surface Area (cm^2) 0.2 cm^2 10/09/23 1417   Post-Procedure Volume (cm^3) 0.02 cm^3 10/09/23 1417   Wound Assessment Hyper granulation tissue 10/09/23 1417   Drainage Amount Small (< 25%) 10/09/23 1417   Drainage Description Serosanguinous 10/09/23 1417   Odor None 10/09/23 1417   Peri-wound Assessment Edematous 10/09/23 1417   Wound Thickness Description not for Pressure Injury Full thickness 10/09/23 1417   Number of days: 0         Subjective:     Chief Complaint   Patient presents with    Wound Check     RLE      History of Present Illness   Chad Rosario is a 68 y.o. male who presents today for initial evaluation of a wound. The patient is returning to the wound center for a recurrent or new wound. He presents alone. The wound(s) has/have been present since (date) February 2025. The patient had a Mohs procedure at that time and the wound was slow to heal. The patient was seen for several months. He was close to healing and requested discharge. Despite instructions to wear his compression, he did not. The wound has not healed. The patient  has a past medical history of CAD (coronary artery disease), Gout, Hypertension, Neuropathy, Osteoarthritis, and Sleep apnea. He is a never smoker. He reports swelling. He denies pain severity: none, drainage, redness, body aches, fever,  and chills. VSS.    Review of Systems   Constitutional:  Negative for appetite change, chills and fever.   Cardiovascular:  Negative for leg swelling.   Gastrointestinal:  Negative for nausea and vomiting.   Musculoskeletal:  Negative for gait problem.   Skin:  Positive for wound.   Allergic/Immunologic: Negative for immunocompromised state.   Neurological:  Negative for numbness.   Hematological:  Bruises/bleeds easily.      PAST MEDICAL HISTORY      Diagnosis Date    CAD (coronary artery disease) 2015    Gout     Hypertension     controlled with meds    Neuropathy May 2019    Osteoarthritis 03/2013    Sleep apnea     cpap at night     PAST SURGICAL HISTORY  Past Surgical History:   Procedure Laterality Date    BACK SURGERY  2021    has had 3 different ones last was in 2021    CARDIAC SURGERY      CABG    CHOLECYSTECTOMY  COLONOSCOPY N/A 11/29/2020    COLONOSCOPY POLYPECTOMY HOT SNARE performed by Randalyn Romans, MD at [facility] ENDOSCOPY    EP DEVICE PROCEDURE N/A 05/22/2023    Ablation A-fib w complete ep study performed by Carlon Reyes PARAS, MD at [facility] CARDIAC CATH LAB    EYE SURGERY Left     cataract    FRACTURE SURGERY Left     tib-fib fracture    JOINT REPLACEMENT  Hip - 2018    KNEE ARTHROSCOPY Right     TOTAL HIP ARTHROPLASTY Left      FAMILY HISTORY  Family History   Problem Relation Age of Onset    Cancer Mother         Lymp    Stroke Father     Cancer Sister         Breast    Clotting Disorder Maternal Grandfather      SOCIAL HISTORY  Social History     Tobacco Use    Smoking status: Never    Smokeless tobacco: Never   Vaping Use    Vaping status: Never Used   Substance Use Topics    Alcohol use: Yes     Alcohol/week: 12.0 standard drinks of alcohol     Types: 6 Cans of beer, 6 Shots of liquor per week     Comment: socially    Drug use: Never     ALLERGIES  Allergies   Allergen Reactions    Hydrocodone-Acetaminophen  Anxiety and Itching    Losartan Shortness Of Breath    Amlodipine Other (See Comments)      Other reaction(s): Other (See Comments)  Fatigue   Fatigue       Rosuvastatin Other (See Comments)     Other reaction(s): Constipation  Fatigue, constipation  Other reaction(s): Fatigue    Atorvastatin Diarrhea    Ezetimibe      Other reaction(s): Constipation, Other (See Comments)  Constipation and decreased urine output  Constipation and decreased urine output  Other reaction(s): Constipation, Urine output low       MEDICATIONS  Current Outpatient Medications on File Prior to Encounter   Medication Sig Dispense Refill    zolpidem  (AMBIEN ) 5 MG tablet TAKE 1 TABLET BY MOUTH EVERY NIGHT AS NEEDED FOR SLEEP. MAX DAILY AMOUNT: 5 MG 30 tablet 2    apixaban  (ELIQUIS ) 5 MG TABS tablet Take 1 tablet by mouth 2 times daily      metoprolol  succinate (TOPROL  XL) 25 MG extended release tablet Take 1 tablet by mouth daily 90 tablet 3    azelastine (ASTEPRO) 137 MCG/SPRAY nasal spray USE 1 TO 2 SPRAYS IN EACH NOSTRIL TWICE DAILY AS NEEDED AS DIRECTED      lisinopril  (PRINIVIL ;ZESTRIL ) 20 MG tablet Take 2 tablets by mouth daily 180 tablet 1    furosemide  (LASIX ) 40 MG tablet Take 1 tablet by mouth daily as needed (take one tablet daily for weight garin greater than 2 lbs in 24 hours or 4 lbs in 48 hours.) 30 tablet 3    Cyanocobalamin (VITAMIN B 12 PO) Take by mouth every morning      allopurinol  (ZYLOPRIM ) 300 MG tablet TAKE 1 TABLET BY MOUTH DAILY 90 tablet 3    gabapentin  (NEURONTIN ) 600 MG tablet Take 1 tablet by mouth in the morning, at noon, in the evening, and at bedtime. 360 tablet 3    Multiple Vitamins-Minerals (THERAPEUTIC MULTIVITAMIN-MINERALS) tablet Take 1 tablet by mouth daily      vitamin D3 (CHOLECALCIFEROL ) 10 MCG (  400 UNIT) TABS tablet Take 2 tablets by mouth daily      CPAP Machine MISC by Does not apply route      colchicine  (MITIGARE ) 0.6 MG capsule Take 1 capsule by mouth as needed for Pain 30 capsule 3    fexofenadine (ALLEGRA) 180 MG tablet Take 1 tablet by mouth daily       No current  facility-administered medications on file prior to encounter.     Objective:    BP 124/70   Pulse 94   Ht 1.854 m (6' 1)   Wt 132 kg (291 lb)   BMI 38.39 kg/m   Wt Readings from Last 3 Encounters:   10/09/23 132 kg (291 lb)   09/29/23 134.3 kg (296 lb)   09/25/23 134.6 kg (296 lb 11.2 oz)     Physical Exam  Vitals and nursing note reviewed.   Constitutional:       General: He is not in acute distress.     Appearance: Normal appearance. He is not toxic-appearing.   Cardiovascular:      Rate and Rhythm: Normal rate.      Pulses: Normal pulses.   Pulmonary:      Effort: Pulmonary effort is normal.   Musculoskeletal:      Right lower leg: Edema present.      Left lower leg: Edema present.   Skin:     General: Skin is warm and dry.      Findings: Wound (Right anterior lower leg) present.      Comments: Lymphedematous changes noted on exam include: hemosiderin deposition, brawny edema, varicose veins, reticular veins, and teleangiectasias.     Neurological:      Mental Status: He is alert. Mental status is at baseline.   Psychiatric:         Mood and Affect: Mood normal.         Behavior: Behavior normal.     On this date 10/09/23, I have spent 30 minutes reviewing the patient's chart (previous notes, test results, etc.) and face to face with them discussing their diagnosis and treatment plan (offloading/pressure relief measures, nutrition requirements for wound healing, smoking cessation (when applicable), infection risk, etc.), documenting the visit, and coordinating their care. Chart review, face to face encounter, and documentation were all completed on the date of service.     This dictation may have been generated in part by voice recognition computer software. Although all attempts are made to edit the dictation for accuracy, there may be errors in the transcription that are not intended.    Electronically signed by Damien Sherre Moles, APRN - NP on 10/09/2023 at 3:12 PM.

## 2023-10-09 NOTE — Wound Image (Signed)
 Discharge Instructions for  [facility]  [address]  [address]  [place], [place] [address]  Phone [phone]   Fax [phone]      NAME:  Chad Rosario  DATE OF BIRTH:  06-28-1955  MEDICAL RECORD NUMBER:  [id number]  DATE:  10/09/2023    Return Appointment:   1 week next Wednesday with Damien Moles, NP      Instructions: RLE:  Cleanse with soap and water  Xeroform- apply to wound bed.  Cover with ABD  2 layer compression with wound and lower extremity assessment.  If there is any problem with the dressing (too tight, slides down, etc.) Patient to return to wound clinic to have re-wrapped by clinician.  Change weekly at wound center    Do not cut compression wraps off. If wraps become too loose or slide down, call wound center to make an appointment for dressing change. If wraps become too tight, try elevating your legs to assist fluid drainage.  Elevate legs when sitting.  Avoid prolonged standing or sitting with legs in dependent position.  Be cautious of increased salt intake as this promotes fluid retention and swelling.  Take diuretic (fluid pill) as instructed by your doctor.  Increase protein intake to promote wound healing. Juven and Ensure are examples of protein supplements.\          Should you experience increased redness, swelling, pain, foul odor, size of wound(s), or have a temperature over 101 degrees please contact the Wound Healing Center at [phone] or if after hours contact your primary care physician or go to the hospital emergency department.    PLEASE NOTE: IF YOU ARE UNABLE TO OBTAIN WOUND SUPPLIES, CONTINUE TO USE THE SUPPLIES YOU HAVE AVAILABLE UNTIL YOU ARE ABLE TO REACH US . IT IS MOST IMPORTANT TO KEEP THE WOUND COVERED AT ALL TIMES.    Electronically signed Duwaine MARLA Akin, RN on 10/09/2023 at 2:35 PM

## 2023-10-09 NOTE — Wound Image (Signed)
 Multilayer Compression Wrap   (Not Unna) Below the Knee    NAME:  Chad Rosario  DATE OF BIRTH:  [birth year]  MEDICAL RECORD NUMBER:  [id number]  DATE:  10/09/2023    Multilayer compression wrap: Removed old Multilayer wrap if indicated and wash leg with mild soap/water.  Applied moisturizing agent to dry skin as needed.   Applied primary and secondary dressing as ordered.  Applied multilayered dressing below the knee to right lower leg.  Instructed patient/caregiver not to remove dressing and to keep it clean and dry.   Instructed patient/caregiver on complications to report to provider, such as pain, numbness in toes, heavy drainage, and slippage of dressing.  Instructed patient on purpose of compression dressing and on activity and exercise recommendations.      Electronically signed by Duwaine MARLA Akin, RN on 10/09/2023 at 2:36 PM

## 2023-10-09 NOTE — Other (Signed)
 10/09/23 1417   Wound 10/09/23 Pretibial Right #1   Date First Assessed/Time First Assessed: 10/09/23 1416   Present on Original Admission: Yes  Wound Approximate Age at First Assessment (Weeks): 2 weeks  Primary Wound Type: Vascular Ulcer  Secondary Wound Type - Vascular Ulcer: Venous  Location: Preti...   Wound Image     Wound Etiology Venous   Dressing Status Clean;Dry;Intact   Wound Cleansed Cleansed with saline   Dressing/Treatment Xeroform   Wound Length (cm) 0.4 cm   Wound Width (cm) 0.5 cm   Wound Depth (cm) 0.1 cm   Wound Surface Area (cm^2) 0.2 cm^2   Wound Volume (cm^3) 0.02 cm^3   Post-Procedure Length (cm) 0.4 cm   Post-Procedure Width (cm) 0.5 cm   Post-Procedure Depth (cm) 0.1 cm   Post-Procedure Surface Area (cm^2) 0.2 cm^2   Post-Procedure Volume (cm^3) 0.02 cm^3   Wound Assessment Hyper granulation tissue   Drainage Amount Small (< 25%)   Drainage Description Serosanguinous   Odor None   Peri-wound Assessment Edematous   Wound Thickness Description not for Pressure Injury Full thickness     Post debridement

## 2023-10-11 DIAGNOSIS — D696 Thrombocytopenia, unspecified: Secondary | ICD-10-CM

## 2023-10-15 ENCOUNTER — Inpatient Hospital Stay: Admit: 2023-10-15 | Discharge: 2023-10-15 | Payer: MEDICARE | Primary: Family Medicine

## 2023-10-15 DIAGNOSIS — I87311 Chronic venous hypertension (idiopathic) with ulcer of right lower extremity: Principal | ICD-10-CM

## 2023-10-15 NOTE — Wound Image (Signed)
 Discharge Instructions for  [facility]  [address]  [address]  [place], [place] [address]  Phone [phone]   Fax [phone]      NAME:  Chad Rosario  DATE OF BIRTH:  10/11/1955  MEDICAL RECORD NUMBER:  [id number]  DATE:  10/15/2023    Return Appointment:   1 week next Wednesday with Damien Moles, NP      Instructions: RLE:  Cleanse with soap and water  Xeroform- apply to wound bed.  Cover with ABD  2 layer compression with wound and lower extremity assessment.  If there is any problem with the dressing (too tight, slides down, etc.) Patient to return to wound clinic to have re-wrapped by clinician.  Change weekly at wound center    Do not cut compression wraps off. If wraps become too loose or slide down, call wound center to make an appointment for dressing change. If wraps become too tight, try elevating your legs to assist fluid drainage.  Elevate legs when sitting.  Avoid prolonged standing or sitting with legs in dependent position.  Be cautious of increased salt intake as this promotes fluid retention and swelling.  Take diuretic (fluid pill) as instructed by your doctor.  Increase protein intake to promote wound healing. Juven and Ensure are examples of protein supplements.\          Should you experience increased redness, swelling, pain, foul odor, size of wound(s), or have a temperature over 101 degrees please contact the Wound Healing Center at [phone] or if after hours contact your primary care physician or go to the hospital emergency department.    PLEASE NOTE: IF YOU ARE UNABLE TO OBTAIN WOUND SUPPLIES, CONTINUE TO USE THE SUPPLIES YOU HAVE AVAILABLE UNTIL YOU ARE ABLE TO REACH US . IT IS MOST IMPORTANT TO KEEP THE WOUND COVERED AT ALL TIMES.    Electronically signed Shona Hutchinson, RN on 10/15/2023 at 3:44 PM

## 2023-10-15 NOTE — Progress Notes (Signed)
 [facility]  Medical Staff Progress Note     [place] B[facility]  MEDICAL RECORD NUMBER: [id number]  AGE: 68 y.o.     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, right (HCC)  Assessment  Wound is much smaller; wound bed is pink and granular, periwound is intact  Leg is less edematous; although patient removed compression wrap for podiatry app yesterday, he was wearing compression sleeve today  Plan  Excisional debridement remove devitalized tissue and promote wound healing  Again discussed the role of compression and wound healing and reinforced that wound likely will not heal without adequate compression; patient is agreeable to compression wrap  Wound care: Vashe cleanse, Xeroform gauze, 30 to 40 mmHg multilayer compression, change weekly  Patient encouraged to elevate leg when able  RTC 1 week     Medical decision making:  Assessment required other independent historian(s): N/A  Comorbid conditions affecting wound healing as noted in PMH and PSH reviewed for this visit  Review of medical records and external note from other providers reviewed for this visit  Pertinent diagnostics were reviewed for this visit; discussed management or test interpretation with other qualified health care professional  Prescription drug management: N/A    Risk of complications and/or mortality of treatment plan:  The patient has a low risk of morbidity and mortality from additional diagnostic testing or treatment. This is due to conditions that affect wound healing as well as patient and procedure risk factors. Patient/caregiver educated and given the opportunity to ask questions. They agree to treatment plan. Pertinent decisions include: N/A .   The patient's diagnosis and/or treatment is significantly limited by the following social drivers of health: N/A.    Discussed with  patient/caregiver factors that negatively affect wound healing: swelling and infection. Discussed with patient/caregiver ways to modify these factors: compression and keeping wound clean and covered. Discussed the benefit of serial debridements. Discussed signs of infection (warmth, redness, swelling, pain, fever >100.5, body aches, chills, loss of appetite, etc.), how and when to contact clinic, when to seek treatment in the ED. Questions asked and answered. Written instructions provided to patient/caregiver. Patient/caregiver voiced understanding of the importance of adhering to instructions for optimal wound healing/best outcome.     Puncture 05/22/23 Femoral (Active)   Number of days: 145       Puncture 05/22/23 Femoral (Active)   Number of days: 145       Wound 06/07/21 Knee Right;Posterior (Active)   Number of days: 860       Wound 10/09/23 Pretibial Right #1 (Active)   Wound Image   10/15/23 1517   Wound Etiology Venous 10/15/23 1517   Dressing Status Intact 10/15/23 1517   Wound Cleansed Vashe 10/15/23 1517   Dressing/Treatment Xeroform 10/15/23 1517   Wound Length (cm) 0.4 cm 10/15/23 1517   Wound Width (cm) 0.3 cm 10/15/23 1517   Wound Depth (cm) 0.1 cm 10/15/23 1517   Wound Surface Area (cm^2) 0.12 cm^2 10/15/23 1517   Change in Wound Size % (l*w) 40 10/15/23 1517   Wound Volume (cm^3) 0.012 cm^3 10/15/23 1517   Wound Healing % 40 10/15/23 1517   Post-Procedure Length (cm) 0.4 cm 10/09/23 1417   Post-Procedure Width (cm) 0.5 cm 10/09/23 1417   Post-Procedure Depth (cm) 0.1 cm 10/09/23 1417   Post-Procedure Surface Area (cm^2) 0.2  cm^2 10/09/23 1417   Post-Procedure Volume (cm^3) 0.02 cm^3 10/09/23 1417   Wound Assessment Epithelialization;Pink/red 10/15/23 1517   Drainage Amount Scant (moist but unmeasurable) 10/15/23 1517   Drainage Description Serosanguinous 10/15/23 1517   Odor None 10/09/23 1417   Peri-wound Assessment Edematous 10/15/23 1517   Wound Thickness Description not for Pressure Injury Full  thickness 10/15/23 1517   Number of days: 6         Subjective:     Chief Complaint   Patient presents with    Wound Check     Right leg     Patient presents for follow up evaluation of wound(s) per chief complaint.     HPI and ROS:  HPI per initial history and physical note. Changes in history since last evaluation: no. Symptoms, wound related questions or concerns, and/or other pertinent information since last visit: no. Tolerating current treatment: yes. Systemic complaints above baseline: no. Dislikes multilayer compression due to heat but is willing to wear for another week. VSS.    Review of Systems   Constitutional:  Negative for appetite change, chills and fever.   Cardiovascular:  Negative for leg swelling.   Gastrointestinal:  Negative for nausea and vomiting.   Musculoskeletal:  Negative for gait problem.   Skin:  Positive for wound.   Allergic/Immunologic: Negative for immunocompromised state.   Neurological:  Negative for numbness.   Hematological:  Bruises/bleeds easily.      Objective:    BP (!) 163/83   Pulse 70   Temp 98.3 F (36.8 C) (Temporal)   Resp 16   Wt Readings from Last 3 Encounters:   10/09/23 132 kg (291 lb)   09/29/23 134.3 kg (296 lb)   09/25/23 134.6 kg (296 lb 11.2 oz)     Physical Exam  Vitals and nursing note reviewed.   Constitutional:       General: He is not in acute distress.     Appearance: Normal appearance. He is not toxic-appearing.   Cardiovascular:      Rate and Rhythm: Normal rate.      Pulses: Normal pulses.   Pulmonary:      Effort: Pulmonary effort is normal.   Musculoskeletal:      Right lower leg: Edema present.      Left lower leg: Edema present.   Skin:     General: Skin is warm and dry.      Findings: Wound (Right anterior lower leg) present.      Comments: Lymphedematous changes noted on exam include: hemosiderin deposition, brawny edema, varicose veins, reticular veins, and teleangiectasias.     Neurological:      Mental Status: He is alert. Mental status is  at baseline.   Psychiatric:         Mood and Affect: Mood normal.         Behavior: Behavior normal.     On this date 10/15/23, I have spent 22 minutes reviewing the patient's chart (previous notes, test results, etc.) and face to face with them discussing their diagnosis and treatment plan (offloading/pressure relief measures, nutrition requirements for wound healing, smoking cessation (when applicable), infection risk, etc.), documenting the visit, and coordinating their care. Chart review, face to face encounter, and documentation were all completed on the date of service.     This dictation may have been generated in part by voice recognition computer software. Although all attempts are made to edit the dictation for accuracy, there may be errors in the  transcription that are not intended.    Electronically signed by Damien Sherre Moles, APRN - NP on 10/15/2023 at 3:31 PM.

## 2023-10-15 NOTE — Assessment & Plan Note (Addendum)
 Assessment  Wound is much smaller; wound bed is pink and granular, periwound is intact  Leg is less edematous; although patient removed compression wrap for podiatry app yesterday, he was wearing compression sleeve today  Plan  Excisional debridement remove devitalized tissue and promote wound healing  Again discussed the role of compression and wound healing and reinforced that wound likely will not heal without adequate compression; patient is agreeable to compression wrap  Wound care: Vashe cleanse, Xeroform gauze, 30 to 40 mmHg multilayer compression, change weekly  Patient encouraged to elevate leg when able  RTC 1 week

## 2023-10-15 NOTE — Other (Signed)
 10/15/23 1517   Right Leg Edema Point of Measurement   Leg circumference 44.5 cm   Ankle circumference 25.5 cm   Foot circumference 26 cm   Compression Therapy Compression stockings   Wound 10/09/23 Pretibial Right #1   Date First Assessed/Time First Assessed: 10/09/23 1416   Present on Original Admission: Yes  Wound Approximate Age at First Assessment (Weeks): 2 weeks  Primary Wound Type: Vascular Ulcer  Secondary Wound Type - Vascular Ulcer: Venous  Location: Preti...   Wound Image    Wound Etiology Venous   Dressing Status Intact   Wound Cleansed Vashe   Dressing/Treatment Xeroform   Wound Length (cm) 0.4 cm   Wound Width (cm) 0.3 cm   Wound Depth (cm) 0.1 cm   Wound Surface Area (cm^2) 0.12 cm^2   Change in Wound Size % (l*w) 40   Wound Volume (cm^3) 0.012 cm^3   Wound Healing % 40   Wound Assessment Epithelialization;Pink/red   Drainage Amount Scant (moist but unmeasurable)   Drainage Description Serosanguinous   Peri-wound Assessment Edematous   Wound Thickness Description not for Pressure Injury Full thickness     Patient removed 2 layer wrap on Monday and replaced with compression sleeve.

## 2023-10-15 NOTE — Wound Image (Signed)
 Multilayer Compression Wrap   (Not Unna) Below the Knee    NAME:  Chad Rosario  DATE OF BIRTH:  1955/05/26  MEDICAL RECORD NUMBER:  [id number]  DATE:  10/15/2023    Multilayer compression wrap: Applied moisturizing agent to dry skin as needed.   Applied primary and secondary dressing as ordered.  Applied multilayered dressing below the knee to right lower leg.  Instructed patient/caregiver not to remove dressing and to keep it clean and dry.   Instructed patient/caregiver on complications to report to provider, such as pain, numbness in toes, heavy drainage, and slippage of dressing.  Instructed patient on purpose of compression dressing and on activity and exercise recommendations.      Electronically signed by Shona Hutchinson, RN on 10/15/2023 at 3:44 PM

## 2023-10-17 LAB — THROMBOTIC VENOUS PANEL
Act. Prt C Resist w/FV Defic.: 2.8 ratio
Antithrombin Activity: 108 %
BETA-2 GLYCOPROTEIN I, IGA 500591: <10 SAU
BETA-2 GLYCOPROTEIN I, IGG 500589: <10 SGU
BETA-2 GLYCOPROTEIN I, IGM 500593: <10 SMU
CARDIOLIPIN AB IGG,CLPN1: <10 [GPL'U]
CARDIOLIPIN AB, IGM,CLPN2: <10 [MPL'U]
DRVVT Confirm Seconds: 58.4 s
DRVVT RATIO: 1.2 ratio
DRVVT Screen: 79 s — ABNORMAL HIGH
Factor VIII Activity: 95 %
HEXAGONAL PHOSPHOLIPID NEUTRAL 500569: 5 s
Homocysteine: 6.2 umol/L
Protein C Activity (Chromogenic): 96 %
Protein S Antigen, Free: 110 %
aPTT: 26.9 s

## 2023-10-20 NOTE — Progress Notes (Signed)
 [facility] [place] [address] ORTHO/NEURO  [facility] [place]  [address]  [place] [facility] [id number]  [phone]  Date of Encounter:  10/20/2023    PATIENT INFORMATION:  Name: Chad Rosario  Age:  68 y.o.  Sex:  male  DOB:  04-03-1955    MRN:  [id number]  Cell Phone:    Telephone Information:   Mobile [phone]     PCP:  Clair Ozell Meth, MD  Insurance:  Payor: MEDICARE / Plan: MEDICARE PART A & B / Product Type: *No Product type* /     CHIEF COMPLAINT:    Chief Complaint   Patient presents with   . Right Shoulder - Follow-up     Denies pain today.    . Left Shoulder - Pain     PS 3-4 with movement.        Roberts Rover MD surgery 03/11/23   I&D Revision right reverse total shoulder arthroplasty     Roberts Rover MD surgery 01/30/23   Right reverse total shoulder arthroplasty with GPS  Open biceps tenodesis      HPI:   The patient is a 68 year old with increasing left shoulder pain.  This is bothering him with reaching and lifting. Bothering him with overhead type activities. He is also having pain at nighttime. No prior surgeries.      He is status post a right reverse shoulder arthroplasty where he developed a Staph epi infection that as treated by ID and currently he is off antibiotics and doing well.     Pain Assessment  Pain Assessment: 0-10  Verbal Pain Score: 4-moderate  Pain Location: Shoulder  Pain Orientation: Left    PAST MEDICAL HISTORY:  Medical History[1]      PAST SURGICAL HISTORY:  Surgical History[2]    FAMILY HISTORY:  Family History[3]    SOCIAL HISTORY:  Social History[4]    MEDICATIONS:  Current Medications[5]    ALLERGIES:  Hydrocodone-acetaminophen , Losartan, Amlodipine, Rosuvastatin, Atorvastatin, and Ezetimibe      PHYSICAL EXAMINATION:  General: Well developed, well-nourished and in no acute distress.  HEENT:  Head atraumatic/normocephalic.  No oropharyngeal lesions.  Neck: Trachea midline, no thyromegaly, no JVD.  Eyes:   Anicteric sclerae, moist conjunctivae.  No lid-lag.  Extraocular muscles intact.   Psychiatric: Well oriented to person, place and time.  Demonstrates normal mood and affect.  Cardiovascular: Regular rhythm by palpation of distal pulse, normal color and temperature, no concerning varicosities on symptomatic side  Pulmonary: Breathing comfortably.  Normal and symmetric chest expansion.  No intercostal retractions.  Skin: No rashes, lesions or ulcers.  Normal temperature, turgor, and texture on uninvolved extremity.  Musculoskeletal:  His right shoulder incision is well healed. No erythema. He has active forward elevation of 170. Active external rotation of 60.     His left shoulder lacks terminal 30 of motion in all planes.  Tenderness anteriorly. Pain at extremes of motion.        IMAGING:  X-rays of his right shoulder taken today show well placed reverse prosthesis without any abnormalities.       Left shoulder has narrowed joint space.       ASSESSMENT(S):  1. Right shoulder pain, unspecified chronicity    2. Primary osteoarthritis of left shoulder        Degenerative joint disease.       TREATMENT:    Plan:  He was given a left intra-articular injection. SJT/jlm  Procedure:  Procedures        Orders:  No orders of the defined types were placed in this encounter.      Rx:  Patient is to continue all medications as directed by prescribing physicians. Continuations on today's visit are made based on the patient's report of current medications      Follow Up:  Return if symptoms worsen or fail to improve.    Notes: Patient is to continue all medications as directed by prescribing physicians. Continuations on today's visit are made based on the patient's report of current medications.     Current Meds Verified: Current meds/immunizations reviewed, including purpose with pt. Med Recon list given to pt/family. Pt advised to discard old med lists and provide all providers with current list at each visit and carry  list with them in case of emergency    .I, as attending physician, participated in the care of this patient and was physically present for the key or critical components of the services provided.   Patient was seen and evaluated with our orthopedic resident/fellow. All imaging was reviewed by myself and discussed with patient. I agree with our assessment and plan going forward. All questions/concerns answered and prognosis discussed.        Roberts Rover, MD       Current view: Showing all answers           Ph Col Ortho Sports Related Questionnaire       Question 10/13/2023 10:13 AM EDT - Fredricka by Patient    Are you here today for a sports related injury? No          Promis Scale V1.2 - Global Health       Question 10/13/2023 10:14 AM EDT - Filed by Patient 06/09/2023  9:47 AM EDT - Fredricka by Patient    Please respond to each question or statement.      In general, would you say your health is: Good Good    In general, would you say your quality of life is: Fair Fair    In general, how would you rate your physical health? Good Good    In general, how would you rate your mental health, including your mood and your ability to think? Very good Very good    In general, how would you rate your satisfaction with your social activities and relationships? Fair Very good    In general, please rate how well you carry out your usual social activities and roles. 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 Moderately Mostly    In the past 7 days      How often have you been bothered by emotional problems such as feeling anxious, depressed or irritable? Rarely Rarely    How would you rate your fatigue on average? Mild Mild    How would you rate your pain on average? 2 2     PROMIS Global Mental Health T-Score (range: 20 - 70) 44 (Good) 48 (Good)    PROMIS Global Physical Health T-Score (range: 15 - 70) 45 (Good) 48 (Good)    PROMIS General Health Score (range: 1 - 5) 3 3    PROMIS Global Social Activities & Roles Score (range: 1 -  5) 2 4          Promis Cat V1.1-Pain Interference (Screen-To-Cat)       Question 10/13/2023 10:14 AM EDT - Filed by Patient    How much did pain interfere with your day to day activities? A little bit    How much did pain interfere with your ability to participate in social activities? A little bit    How much did pain interfere with your enjoyment of social activities? A little bit    How much did pain interfere with work around the home? A little bit    PROMIS Pain Interference T-Score (range: 10 - 90) 56 (mild)          Promis Cat V1.0 - Depression (Screen-To-Cat)       Question 10/13/2023 10:15 AM EDT - Fredricka by Patient    I felt depressed Rarely    I felt like a failure Rarely    I felt worthless Never    I felt helpless Rarely    PROMIS Depression T-Score (range: 10 - 90) 53 (within normal limits)          Promis Cat V2.1 - Upper Extremity (Screen-To-Cat)       Question 10/13/2023 10:15 AM EDT - Fredricka by Patient    Are you able to carry a heavy object (over 10 pounds /5 kg)? With some difficulty    Are you able to put on and take off a coat or jacket? With a little difficulty    Are you able to carry a shopping bag or briefcase? With a little difficulty    Are you able to pour liquid from a bottle into a glass? Without any difficulty    PROMIS Upper Extremity Function T-Score (range: 10 - 90) 36 (moderate dysfunction)*          Promis Cat V2.0 - Physical Function (Screen-To-Cat)       Question 10/13/2023 10:16 AM EDT - Fredricka by Patient    Does your health now limit you in doing two hours of physical labor? Quite a lot    Does your health now limit you in doing yard work like raking leaves, weeding, or pushing a Surveyor, mining? Quite a lot    Are you able to do  chores such as vacuuming or yard work? With some difficulty    Are you able to carry a laundry basket up a flight of stairs? With some difficulty    PROMIS Physical Function T-Score (range: 10 - 90) 38 (moderate dysfunction)*                 [1]  Past Medical History:  Diagnosis Date   . Chronic venous insufficiency    . Coronary artery disease    . Dvt femoral (deep venous thrombosis) (HCC)    . Fatty liver    . HLD (hyperlipidemia)    . Hypertension    . Influenza    . Joint infection (HCC) 11/24    Current condition   . Measles    . Meningitis 3/03   . Shingles 8/81   . Sleep apnea    [2]  Past Surgical History:  Procedure Laterality Date   . ABDOMINAL SURGERY  12/21.  Back surgery   . ABSCESS DRAINAGE  12/24   . BACK SURGERY      fusion   . CARDIAC SURGERY  8/17    Double by pass   . CARPAL TUNNEL RELEASE Bilateral    .  CHOLECYSTECTOMY     . CORONARY ARTERY BYPASS GRAFT  2015    with stent.   SABRA EYE SURGERY     . FRACTURE SURGERY  11/07    Left leg   . JOINT REPLACEMENT  8/19. 11/24    Shoulder -right.  Hip - left   . KNEE ARTHROSCOPY Bilateral    . LEG SURGERY     . REVERSE TOTAL SHOULDER ARTHROPLASTY Right 01/30/2023    Roberts Rover MD   . SHOULDER ARTHROSCOPY Bilateral    . SHOULDER SURGERY Right 03/11/2023    INCISION & DRAINAGE, SHOULDER AREA; DEEP ABSCESS OR HEMATOMA *RIGHT SHOULDER INCISION AND DRAINAGE, POSSIBLE REVISION OF RIGHT REVERSE TOTAL SHOULDER ARTHROPLASTY SJT   . TOTAL HIP ARTHROPLASTY Left    [3]  Family History  Problem Relation Age of Onset   . Cancer Mother    . Arthritis Father    . Heart disease Father    . Hypertension Father    . Stroke Father    . Early death Sister    [4]  Social History  Socioeconomic History   . Marital status: Married   Tobacco Use   . Smoking status: Never   . Smokeless tobacco: Never   Vaping Use   . Vaping status: Never Used   Substance and Sexual Activity   . Alcohol use: Yes     Alcohol/week: 10.0 standard drinks of alcohol     Types: 2 Glasses of wine, 4 Cans  of beer, 4 Servings of liquor per week     Comment: social   . Drug use: Never   . Sexual activity: Not Currently     Partners: Male     Birth control/protection: Surgical, Other, None   [5]  Current Outpatient Medications   Medication Sig Dispense Refill   . acetaminophen  (TYLENOL ) 500 mg Take 1,000 mg by mouth every 6 (six) hours as needed     . allopurinoL  (ZYLOPRIM ) 300 mg tablet Take by mouth every morning     . alpha lipoic acid 200 mg Take 300 mg (Patient not taking: Reported on 08/13/2023)     . amitriptyline (ELAVIL) 10 mg tablet at bedtime as needed (Patient not taking: Reported on 08/13/2023)     . apixaban  (ELIQUIS ) 5 mg 5 mg by Oral/Gastric Tube route 2 (two) times a day     . ascorbic acid (VITAMIN C ORAL) Take by mouth every morning     . aspirin (ECOTRIN) 81 mg EC tablet Take 1 tablet (81 mg) by mouth daily Start taking day of surgery and then stop once taking Xarelto  again. 30 tablet 0   . azelastine (ASTELIN) 137 mcg (0.1 %) nasal spray 1 spray into each nostril daily as needed     . CPAP Take by mouth nightly Use as instructed     . cyanocobalamin, vitamin B-12, (VITAMIN B12 ORAL) Take by mouth every morning     . docusate sodium (COLACE) 100 mg capsule Take 1 capsule (100 mg) by mouth 2 (two) times a day as needed for constipation for up to 10 days 20 capsule 0   . ergocalciferol (DRISDOL) 1,250 mcg (50,000 unit) capsule Take by mouth in the morning.     . fexofenadine (ALLEGRA) 60 mg tablet Take 60 mg by mouth every morning     . gabapentin  (NEURONTIN ) 300 mg capsule Take 1 capsule (300 mg) by mouth 3 (three) times a day 90 capsule 0   . lisinopriL  (ZESTRIL ) 20 mg  tablet Take 20 mg by mouth every morning (Patient taking differently: Take 20 mg by mouth every morning 40mg  daily)     . METOPROLOL  SUCCINATE ORAL Take by mouth Pt unsure of dosage     . naloxone (NARCAN) 4 mg/actuation nasal spray Place 1 spray (4 mg) into one nostril once as needed for opioid overdose for up to 1 dose (Patient not  taking: Reported on 08/13/2023) 1 each 0   . ondansetron  (ZOFRAN  ODT) 4 mg disintegrating tablet Take 1 tablet (4 mg) by mouth 3 (three) times a day as needed for nausea or vomiting for up to 10 days 30 tablet 0   . pyridoxine  HCl, vitamin B6, (VITAMIN B-6 ORAL) Take by mouth every morning     . rivaroxaban  (XARELTO ) tablet Take 1 tablet (20 mg) by mouth every morning DO NOT start until Saturday 03/16/23 then take as previously prescribed. 30 tablet 0   . senna (SENOKOT) 8.6 mg tablet Take 2 tablets by mouth at bedtime as needed for constipation for up to 10 days 20 tablet 0   . zolpidem  (AMBIEN ) 10 mg tablet Take 10 mg by mouth at bedtime as needed (Patient taking differently: Take 10 mg by mouth at bedtime as needed Pt states taking 5mg  PRN)       No current facility-administered medications for this visit.   *Some images could not be shown.

## 2023-10-22 ENCOUNTER — Inpatient Hospital Stay: Admit: 2023-10-22 | Discharge: 2023-10-22 | Payer: MEDICARE | Primary: Family Medicine

## 2023-10-22 DIAGNOSIS — I87311 Chronic venous hypertension (idiopathic) with ulcer of right lower extremity: Principal | ICD-10-CM

## 2023-10-22 NOTE — Progress Notes (Signed)
 [facility]  Medical Staff Progress Note     [facility]  MEDICAL RECORD NUMBER: [id number]  AGE: 68 y.o.     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, right (HCC)  Assessment  Healed  Plan  [facility] discharge education -  Moisturize daily with emollient of choice (Vaseline, Aquaphor, etc.)  Patient encouraged to elevate leg at/above level of the heart when possible   Recommend daily use of: OTC compression stockings; replace every 3 months  Will need compression for life  Discharge from clinic - RTC PRN open wound      Medical decision making:  Assessment required other independent historian(s): N/A  Comorbid conditions affecting wound healing as noted in PMH and PSH reviewed for this visit  Review of medical records and external note from other providers reviewed for this visit  Pertinent diagnostics were reviewed for this visit; discussed management or test interpretation with other qualified health care professional  Prescription drug management: N/A    Risk of complications and/or mortality of treatment plan:  The patient has a minimal risk of morbidity and mortality from additional diagnostic testing or treatment. This is due to conditions that affect wound healing as well as patient and procedure risk factors. Patient/caregiver educated and given the opportunity to ask questions. They agree to treatment plan. Pertinent decisions include: N/A .   The patient's diagnosis and/or treatment is significantly limited by the following social drivers of health: N/A.    Discussed with patient/caregiver factors that negatively affect wound healing: swelling. Discussed with patient/caregiver ways to modify these factors: compression. Discussed the benefit of serial debridements. Discussed signs of infection (warmth, redness, swelling, pain, fever >100.5, body aches,  chills, loss of appetite, etc.), how and when to contact clinic, when to seek treatment in the ED. Questions asked and answered. Written instructions provided to patient/caregiver. Patient/caregiver voiced understanding of the importance of adhering to instructions for optimal wound healing/best outcome.     Puncture 05/22/23 Femoral (Active)   Number of days: 174       Puncture 05/22/23 Femoral (Active)   Number of days: 174       Wound 06/07/21 Knee Right;Posterior (Active)   Number of days: 889         Subjective:     Chief Complaint   Patient presents with    Wound Check     RLE - anterior     Patient presents for follow up evaluation of wound(s) per chief complaint.     HPI and ROS:  HPI per initial history and physical note. Changes in history since last evaluation: no. Symptoms, wound related questions or concerns, and/or other pertinent information since last visit: no. Tolerating current treatment: yes. Systemic complaints above baseline: no. VSS.    Review of Systems   Constitutional:  Negative for appetite change, chills and fever.   Cardiovascular:  Positive for leg swelling.   Gastrointestinal:  Negative for nausea and vomiting.   Musculoskeletal:  Negative for gait problem.   Skin:  Negative for wound.   Allergic/Immunologic: Negative for immunocompromised state.   Neurological:  Negative for numbness.   Hematological:  Bruises/bleeds easily.      Objective:    BP 139/79   Pulse 70   Temp 98.2 F (36.8 C) (Oral)   Resp 16   Ht 1.854  m (6' 1)   Wt 133.4 kg (294 lb)   SpO2 95%   BMI 38.79 kg/m   Wt Readings from Last 3 Encounters:   11/11/23 133.8 kg (295 lb)   10/30/23 131.5 kg (290 lb)   10/22/23 133.4 kg (294 lb)     Physical Exam  Vitals and nursing note reviewed.   Constitutional:       General: He is not in acute distress.     Appearance: Normal appearance. He is not toxic-appearing.   Cardiovascular:      Rate and Rhythm: Normal rate.      Pulses: Normal pulses.   Pulmonary:      Effort:  Pulmonary effort is normal.   Musculoskeletal:      Right lower leg: Edema present.      Left lower leg: Edema present.   Skin:     General: Skin is warm and dry.      Findings: No wound.      Comments: Lymphedematous changes noted on exam include: hemosiderin deposition, brawny edema, varicose veins, reticular veins, and teleangiectasias.     Neurological:      Mental Status: He is alert. Mental status is at baseline.   Psychiatric:         Mood and Affect: Mood normal.         Behavior: Behavior normal.     On this date 11/13/23, I have spent 22 minutes reviewing the patient's chart (previous notes, test results, etc.) and face to face with them discussing their diagnosis and treatment plan (offloading/pressure relief measures, nutrition requirements for wound healing, smoking cessation (when applicable), infection risk, etc.), documenting the visit, and coordinating their care. Chart review, face to face encounter, and documentation were all completed on the date of service.     This dictation may have been generated in part by voice recognition computer software. Although all attempts are made to edit the dictation for accuracy, there may be errors in the transcription that are not intended.    Electronically signed by Damien Sherre Moles, APRN - NP on 11/13/2023 at 12:49 PM.

## 2023-10-22 NOTE — Other (Signed)
 10/22/23 1441   Right Leg Edema Point of Measurement   Leg circumference 43 cm   Ankle circumference 25 cm   Foot circumference 25 cm   Compression Therapy Compression stockings   Wound 10/09/23 Pretibial Right #1   Date First Assessed/Time First Assessed: 10/09/23 1416   Present on Original Admission: Yes  Wound Approximate Age at First Assessment (Weeks): 2 weeks  Primary Wound Type: Vascular Ulcer  Secondary Wound Type - Vascular Ulcer: Venous  Location: Preti...   Wound Image    Wound Etiology Venous   Dressing Status Intact   Wound Cleansed Cleansed with saline   Dressing/Treatment Xeroform   Wound Length (cm) 0 cm   Wound Width (cm) 0 cm   Wound Depth (cm) 0 cm   Wound Surface Area (cm^2) 0 cm^2   Change in Wound Size % (l*w) 100   Wound Volume (cm^3) 0 cm^3   Wound Healing % 100   Wound Assessment Epithelialization   Drainage Amount None (dry)   Peri-wound Assessment Edematous   Pain Assessment   Pain Assessment None - Denies Pain     Patient takes Eliquis .

## 2023-10-22 NOTE — Wound Image (Signed)
 Discharge Instructions for  [facility]  [address]  [address]  [place], [place] [address]  Phone [phone]   Fax [phone]      NAME:  Chad Rosario  DATE OF BIRTH:  Dec 28, 1955  MEDICAL RECORD NUMBER:  [id number]  DATE:  10/22/2023    Return Appointment:   Discharge with Damien Moles, NP      Instructions:   Right Lower Leg wound resolved!  Moisturize lower legs daily  Vaseline to fragile new skin 2x daily x 1 month.  Compression Stockings daily    Elevate legs when sitting.  Avoid prolonged standing or sitting with legs in dependent position.  Be cautious of increased salt intake as this promotes fluid retention and swelling.  Take diuretic (fluid pill) as instructed by your doctor.  Increase protein intake to promote wound healing. Juven and Ensure are examples of protein supplements.        Should you experience increased redness, swelling, pain, foul odor, size of wound(s), or have a temperature over 101 degrees please contact the Wound Healing Center at [phone] or if after hours contact your primary care physician or go to the hospital emergency department.    PLEASE NOTE: IF YOU ARE UNABLE TO OBTAIN WOUND SUPPLIES, CONTINUE TO USE THE SUPPLIES YOU HAVE AVAILABLE UNTIL YOU ARE ABLE TO REACH US . IT IS MOST IMPORTANT TO KEEP THE WOUND COVERED AT ALL TIMES.    Electronically signed Darice CROME. Reece, RN on 10/22/2023 at 2:48 PM

## 2023-10-22 NOTE — Assessment & Plan Note (Addendum)
 Assessment  Healed  Plan  [facility] discharge education -  Moisturize daily with emollient of choice (Vaseline, Aquaphor, etc.)  Patient encouraged to elevate leg at/above level of the heart when possible   Recommend daily use of: OTC compression stockings; replace every 3 months  Will need compression for life  Discharge from clinic - RTC PRN open wound

## 2023-10-22 NOTE — Discharge Instructions (Signed)
 Right Lower Leg wound resolved!  Moisturize lower legs daily  Vaseline to fragile new skin 2x daily x 1 month.  Compression Stockings daily     Elevate legs when sitting.  Avoid prolonged standing or sitting with legs in dependent position.  Be cautious of increased salt intake as this promotes fluid retention and swelling.  Take diuretic (fluid pill) as instructed by your doctor.  Increase protein intake to promote wound healing. Juven and Ensure are examples of protein supplements.

## 2023-10-27 MED ORDER — ALLOPURINOL 300 MG PO TABS
300 | ORAL_TABLET | Freq: Every day | ORAL | 0 refills | 90.00000 days | Status: DC
Start: 2023-10-27 — End: 2023-12-10

## 2023-10-30 ENCOUNTER — Ambulatory Visit: Admit: 2023-10-30 | Discharge: 2023-10-30 | Payer: MEDICARE | Attending: Family Medicine | Primary: Family Medicine

## 2023-10-30 DIAGNOSIS — L089 Local infection of the skin and subcutaneous tissue, unspecified: Principal | ICD-10-CM

## 2023-10-30 MED ORDER — MUPIROCIN 2 % EX OINT
2 | CUTANEOUS | 2 refills | 10.00000 days | Status: AC
Start: 2023-10-30 — End: 2023-11-06

## 2023-10-31 ENCOUNTER — Telehealth
Admit: 2023-10-31 | Discharge: 2023-10-31 | Payer: MEDICARE | Attending: Hematology & Oncology | Primary: Family Medicine

## 2023-10-31 DIAGNOSIS — I82511 Chronic embolism and thrombosis of right femoral vein: Principal | ICD-10-CM

## 2023-10-31 NOTE — Progress Notes (Unsigned)
 [facility] Hematology and Oncology: Established patient - follow up     No chief complaint on file.    History of Present Illness:  Chad Rosario is a 68 y.o. male who presents today in referral from Dr. No ref. provider found.  The past medical history is significant for CAD, s/p CABG x2 in 2015 on Eliquis , HLD, secondary polycythemia, DVT, fatty liver, idiopathic neuropathy, colon polyps (last colonoscopy 11/2020, path returned as mixed tubulovillous adenoma), sleep apnea, significant degenerative disc disease in his back status post multiple surgeries to address.  06/07/2021 he underwent PTV/PopV recanalization with PA (Dr. Rox).     The patient (or guardian, if applicable) and other individuals in attendance with the patient were advised that Artificial Intelligence will be utilized during this visit to record, process the conversation to generate a clinical note, and support improvement of the AI technology. The patient (or guardian, if applicable) and other individuals in attendance at the appointment consented to the use of AI, including the recording.    09/29/23  History of Present Illness  The patient presents for evaluation of thrombocytopenia on [facility] for hx of DVT, currently on Eliquis .  He reports a history of deep vein thrombosis (DVT) approximately 10 years ago, which was associated with pain, warmth, and redness. 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 He also has a history of heart disease and has undergone double bypass surgery. He is currently on Eliquis  and is eager to discontinue it.  He has been experiencing balance issues due to neuropathy for the past 3 years, which worsened over the last year. He does not experience any pain. The cause of his neuropathy is unknown. He consulted a neurologist and was dx'ed with idiopathic neuropathy. He has undergone conduction studies. He retains sensation and can feel  pressure, but his fine motor skills are impaired. He has been taking a nerve supplement, which improved his foot movement. He also has foot drop. He had a leg fracture and compartment syndrome, which led to his current condition.  He was informed that he may have fatty liver. This was first mentioned to him in 2007 when he was hospitalized for leg fracture. He underwent a FibroScan in 12/2022, which revealed mild liver fibrosis. He has been under the care of Dr. Roblin, a gastroenterologist. His bilirubin levels have been elevated since 1992. He had an ultrasound of his liver and spleen, which showed slight enlargement. He does not have a gallbladder.  On evaluation today, he denies lower extremity pain or new swelling.  No chest pain, worsening shortness of breath, bleeding or dark stools, abdominal pain, NVD.  No B symptoms.  No falls, head trauma.  He has chronic leg weakness and due to neuropathy, needs to focus to walk/prevent tripping.      SOCIAL HISTORY  The patient drinks alcohol, typically three or four drinks on Fridays and Saturdays, and occasionally on Sundays.      The patient is here for a follow-up evaluation of thrombocytopenia and idiopathic neuropathy. He is currently on anticoagulation therapy with Eliquis  due to a history of deep vein thrombosis (DVT). Approximately 10 years ago, he experienced a DVT characterized by pain, warmth, and redness, which occurred during a period of frequent travel for work across multiple states.    He has a history of secondary erythrocytosis and underwent a bone marrow biopsy in 2017. A comprehensive lab workup was conducted to  investigate potential autoimmune causes. His MPN mutation panel did not reveal JAK2, CALR, or nipple mutations, suggesting the absence of myeloproliferative disease at this time. His hypercoagulable workup showed normal homocysteine, factor 8 activity, antithrombin activity, protein C and protein S activity and antigen, respectively.  Coagulation tests were normal. The DRVVT screen was increased, but the confirmatory test was normal. Cardiolipin levels were normal, and he did not have a prothrombin mutation. An ultrasound of the spleen and liver was also performed.    He has idiopathic neuropathy, for which conduction studies were completed.    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 OVERALL, THE   FEATURES ARE MOST IN KEEPING WITH SECONDARY ERYTHROCYTOSIS.   11/29/2014.      DVT RLE 7/17  There was not enough blood to do the complete antiphospholipid testing. The lupus anticoagulant was positive which may be artifactual related to the presence of xarelto . Both factor V Leiden and the prothrombin gene mutation were absent     05/28/22: six month follow up. okay overall. recent left knee surgery. 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 seeing vascular soon.  denies chest pain, dizziness, nausea, vomiting, fever, black or bloody stools, spontaneous bleeding.  tolerating Eliquis  5 mg BID but not affordable and switching back to Xarelto .  Hgb remains mildly elevated but stable. Platetlets 64,000 which is low but stable from September/November results. to hold [facility] if/when plts <50K.  Most likely cause of thrombocytopenia is liver disease -  encouraged weight loss and avoidance of liver toxic substances like Tylenol /alcohol.   11/28/22 - switched from Eliquis  to Xarelto  and has been taking the medication compliantly at 20 mg p.o. daily.  In last few weeks, noted increased bruising but denies spontaneous bleeding.  denies any pain in legs.  evaluated by Dr. Rox in March.  Venous ultrasound RLE noted occluded femoral vein, it was open a year ago.  Due to chronically occluded right popliteal vein, access to the occluded femoral vein would be challenging.  Any intervention has therefore been deferred until a point when he develops worsening symptoms (swelling, pain, discoloration).  His platelet count has been trending down slowly and is 58,000.  Due to increased bruising, reduced dose of Xarelto  to 10 mg daily.  dose may need to be increased back to 20 mg daily, should he develop any acute thrombotic event.  Likely etiology for thrombocytopenia is liver disease/splenomegaly.  Patient reports a history of binge drinking until his 89s.  Cautioned  against alcohol consumption.  Advised avoidance of acetaminophen .  Rec ultrasound liver/spleen for further evaluation and likely refer him back to GI.    01/30/23 - underwent right shoulder revision arthroplasty and developed joint space infection. He received i.v. antibiotics and was on long term oral abx.  03/2023 FU with Dr Hildegard     09/29/23 first time seeing pt for evaluation     Family History   Problem Relation Age of Onset   . Cancer Mother         Lymp   . Stroke Father    . Cancer Sister         Breast   . Clotting Disorder Maternal Grandfather       Social History     Socioeconomic History   . Marital status: Married   Tobacco Use   . Smoking status: Never   . Smokeless tobacco: Never   Vaping Use   . Vaping status: Never Used   Substance and Sexual Activity   . Alcohol use: Yes     Alcohol/week: 12.0 standard drinks of alcohol     Types: 6 Cans of beer, 6 Shots of liquor per week     Comment: socially    . Drug use: Never   . Sexual activity: Not Currently     Partners: Female     Social Drivers of Health     Financial Resource Strain: Low Risk  (04/09/2022)    Received from [facility], [facility]    Overall Financial Resource Strain (CARDIA)    . Difficulty of Paying Living Expenses: Not hard at all   Food Insecurity: No Food Insecurity (05/21/2023)    Hunger Vital Sign    . Worried About Programme researcher, broadcasting/film/video in the Last Year: Never true    . Ran Out of Food in the Last Year: Never true   Transportation Needs: No Transportation Needs (05/21/2023)    PRAPARE - Transportation    . Lack of Transportation (Medical): No    . Lack of Transportation (Non-Medical): No   Physical Activity: Insufficiently Active (08/05/2023)    Exercise Vital Sign    . Days of Exercise per Week: 3 days    . Minutes of Exercise per Session: 30 min   Stress: No Stress Concern Present (04/09/2022)    Received from [facility], [facility] - Occupational Stress Questionnaire    . Feeling of Stress : Not at all   Social Connections: Socially Integrated (04/09/2022)    Received from [facility], [facility]    Social Network    . How would you rate your social network (family, work, friends)?: Good participation with social networks   Intimate Partner Violence: Not At Risk (04/09/2022)    Received from [facility], [facility]    HITS    . Over the last 12 months how often did your partner physically hurt you?: Never    . Over the last 12 months how often did your partner insult you or talk down to you?: Never    . Over the last 12 months how often did your partner threaten you with physical harm?: Never    . Over the last 12 months how often did your partner scream or curse at you?: Never   Housing Stability: Low Risk  (05/21/2023)    Housing Stability Vital Sign    . Unable to Pay for Housing in the Last Year: No    . Number of Times Moved in the Last Year: 0    . Homeless in the Last Year:  No      Review of Systems   Constitutional:  Positive for fatigue. Negative for appetite change, diaphoresis, fever and unexpected weight change.  HENT:   Negative for sore throat.    Eyes:  Negative for eye problems and icterus.   Respiratory:  Negative for cough, hemoptysis and shortness of breath.    Cardiovascular:  Negative for chest pain, leg swelling and palpitations.   Gastrointestinal:  Negative for abdominal distention, abdominal pain, blood in stool, constipation, diarrhea, nausea and vomiting.   Endocrine: Negative for hot flashes.   Genitourinary:  Negative for dysuria.    Musculoskeletal:  Positive for arthralgias, gait problem and myalgias. Negative for back pain.   Skin:  Negative for itching, rash and wound.   Neurological:  Positive for gait problem and numbness. Negative for dizziness, extremity weakness, headaches, light-headedness, seizures and speech difficulty.   Hematological:  Negative for adenopathy. Does not bruise/bleed easily.   Psychiatric/Behavioral:  Positive for sleep disturbance. Negative for decreased concentration and depression. The patient is not nervous/anxious.       Allergies   Allergen Reactions   . Hydrocodone-Acetaminophen  Anxiety and Itching   . Losartan Shortness Of Breath   . Amlodipine Other (See Comments)     Other reaction(s): Other (See Comments)  Fatigue   Fatigue      . Rosuvastatin Other (See Comments)     Other reaction(s): Constipation  Fatigue, constipation  Other reaction(s): Fatigue   . Atorvastatin Diarrhea   . Ezetimibe      Other reaction(s): Constipation, Other (See Comments)  Constipation and decreased urine output  Constipation and decreased urine output  Other reaction(s): Constipation, Urine output low       Past Medical History:   Diagnosis Date   . CAD (coronary artery disease) 2015   . Gout    . Hypertension     controlled with meds   . Neuropathy May 2019   . Osteoarthritis 03/2013   . Sleep apnea     cpap at night     Past Surgical History:    Procedure Laterality Date   . BACK SURGERY  2021    has had 3 different ones last was in 2021   . CARDIAC SURGERY      CABG   . CHOLECYSTECTOMY     . COLONOSCOPY N/A 11/29/2020    COLONOSCOPY POLYPECTOMY HOT SNARE performed by Randalyn Romans, MD at [facility] ENDOSCOPY   . EP DEVICE PROCEDURE N/A 05/22/2023    Ablation A-fib w complete ep study performed by Carlon Reyes PARAS, MD at [facility] CARDIAC CATH LAB   . EYE SURGERY Left     cataract   . FRACTURE SURGERY Left     tib-fib fracture   . JOINT REPLACEMENT  Hip - 2018   . KNEE ARTHROSCOPY Right    . TOTAL HIP ARTHROPLASTY Left      Current Outpatient Medications   Medication Sig Dispense Refill   . zolpidem  (AMBIEN ) 5 MG tablet TAKE 1 TABLET BY MOUTH EVERY NIGHT AS NEEDED FOR SLEEP. MAX DAILY AMOUNT: 5 MG 30 tablet 2   . apixaban  (ELIQUIS ) 5 MG TABS tablet Take 1 tablet by mouth 2 times daily     . metoprolol  succinate (TOPROL  XL) 25 MG extended release tablet Take 1 tablet by mouth daily 90 tablet 3   . azelastine (ASTEPRO) 137 MCG/SPRAY nasal spray USE 1 TO 2 SPRAYS IN EACH NOSTRIL TWICE DAILY AS NEEDED AS DIRECTED     . lisinopril  (PRINIVIL ;ZESTRIL ) 20 MG tablet Take 2 tablets by mouth daily 180 tablet 1   . furosemide  (LASIX ) 40 MG tablet Take 1 tablet by  mouth daily as needed (take one tablet daily for weight garin greater than 2 lbs in 24 hours or 4 lbs in 48 hours.) 30 tablet 3   . Cyanocobalamin (VITAMIN B 12 PO) Take by mouth every morning     . allopurinol  (ZYLOPRIM ) 300 MG tablet TAKE 1 TABLET BY MOUTH DAILY 90 tablet 3   . gabapentin  (NEURONTIN ) 600 MG tablet Take 1 tablet by mouth in the morning, at noon, in the evening, and at bedtime. 360 tablet 3   . Multiple Vitamins-Minerals (THERAPEUTIC MULTIVITAMIN-MINERALS) tablet Take 1 tablet by mouth daily     . vitamin D3 (CHOLECALCIFEROL ) 10 MCG (400 UNIT) TABS tablet Take 2 tablets by mouth daily     . CPAP Machine MISC by Does not apply route     . colchicine  (MITIGARE ) 0.6 MG capsule Take 1 capsule by mouth as  needed for Pain 30 capsule 3   . fexofenadine (ALLEGRA) 180 MG tablet Take 1 tablet by mouth daily       No current facility-administered medications for this visit.     OBJECTIVE:  There were no vitals taken for this visit.    ECOG PERFORMANCE STATUS - 2 - Ambulatory and capable of all selfcare but unable to carry out any work activities. Up and about more than 50% of waking hours.  Pain - /10. None/Minimal pain - not affecting QOL     Physical Exam  Vitals reviewed.   Constitutional:       General: He is not in acute distress.     Appearance: Normal appearance. He is obese. He is not ill-appearing.   HENT:      Head: Normocephalic and atraumatic.      Nose: Nose normal. No congestion.      Mouth/Throat:      Mouth: Mucous membranes are moist.   Eyes:      General: No scleral icterus.     Extraocular Movements: Extraocular movements intact.      Conjunctiva/sclera: Conjunctivae normal.      Pupils: Pupils are equal, round, and reactive to light.   Cardiovascular:      Rate and Rhythm: Normal rate and regular rhythm.      Heart sounds: No murmur heard.  Pulmonary:      Effort: Pulmonary effort is normal. No respiratory distress.      Breath sounds: Normal breath sounds. No wheezing, rhonchi or rales.   Abdominal:      General: There is no distension.      Palpations: Abdomen is soft. There is no mass.      Tenderness: There is no abdominal tenderness. There is no guarding or rebound.   Musculoskeletal:      Cervical back: Normal range of motion. No rigidity.   Lymphadenopathy:      Cervical: No cervical adenopathy.      Upper Body:      Right upper body: No supraclavicular or axillary adenopathy.      Left upper body: No supraclavicular or axillary adenopathy.   Skin:     General: Skin is warm and dry.      Coloration: Skin is not jaundiced or pale.      Comments: Post surgical changes in LE    Neurological:      General: No focal deficit present.      Mental Status: He is alert and oriented to person, place, and  time.      Motor: No weakness.  Coordination: Coordination normal.      Gait: Gait abnormal.   Psychiatric:         Behavior: Behavior normal.         Thought Content: Thought content normal.      Labs:  No results found for this or any previous visit (from the past week).    Imaging: reviewed   PATHOLOGY:           ASSESSMENT:  {No diagnosis found. (Refresh or delete this SmartLink)}  [name] is here for evaluation     1. 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  Elevated bilirubin levels suggest liver dz/Gilbert's disease.  Prev referred to GI for eval and fatty liver was confirmed.    - needs monitoring of TCP as on AC.  AC should be held when plts <50K or with active bleeding.    - A comprehensive lab workup will be conducted today, including tests for autoimmune causes, viral serologies, and [employer]. AFP will be checked.    - total abdominal ultrasound will be ordered to assess the size of the spleen and liver.  Prev liver at 20-21cm and spleen 16cm.      2. 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 Rox could not perform further interventions due to the occlusion; communicating w DR Rox re pt's case   - no new clotting issues in the arms or legs. Swelling persists without pain.     3. Neuropathy.  Neuropathy causing balance issues has worsened over the past year. Conduction studies confirmed neuropathy. Currently taking a nerve supplement that has improved symptoms.  To FU with neuro per their recs.      4. Fatty liver/hepatosplenomegaly - drinks alcohol and hx of fatty liver   - FibroScan showing mild fibrosis (F1), fatty liver and  organomegaly.  - Liver function tests show mildly increased AST and elevated bilirubin. Alcohol consumption can be contributing to liver abnormalities. abdominal ultrasound will be ordered to reassess the liver and spleen.  Advised to avoid alcohol.    - organomegaly contributes to sequestration of wbc/plts - leading to cytopenias.       Follow-up: A virtual visit will be scheduled in a few weeks to discuss the results of these tests.    [26min - chart review, review of results and discussion of options, next steps, coordination of care, communication with other MD and charting ]    RESUSCITATION DIRECTIVES/HOSPICE CARE: Full Support    MDM    Lab studies and imaging studies were personally reviewed.  Pertinent old records were reviewed.     Historical:    All questions were asked and answered to the best of my ability.  The patient verbalized understanding and agrees with the plan above.      Asia Kathryne) Nalani, MD  [facility]  [address]  [place], [place] [address]  Office : [phone]  Fax : [phone]

## 2023-10-31 NOTE — Patient Instructions (Addendum)
 Patient Information from Today's Visit    Diagnosis: Thrombocytopenia; History of DVT      Follow Up Instructions: 4-5 months.    Labs reviewed.  Symptoms reviewed.      Treatment Summary has been discussed and given to patient: N/A      Current Labs:   Hospital Outpatient Visit on 09/29/2023   Component Date Value Ref Range Status    Homocysteine 09/29/2023 6.2  umol/L Final    Comment: (NOTE)  Homocysteine levels in patients >60 years increase 1-2  umol/L.  Reference Range:  5.0 - 15.0      Factor VIII Activity 09/29/2023 95  % Final    Comment: (NOTE)  Reference Range:  57 - 163      Antithrombin Activity 09/29/2023 108  % Final    Comment: (NOTE)  Direct oral anticoagulants such as rivaroxaban , apixaban  and  edoxaban will lead to spuriously elevated antithrombin  activity levels possibly masking a deficiency.  Reference Range:  7 months and older: 95 - 135      Protein C Activity (Chromogenic) 09/29/2023 96  % Final    Comment: (NOTE)  Reference Range:  17 years and older: 39 - 180      Protein S Antigen, Free 09/29/2023 110  % Final    Comment: (NOTE)  Reference Range:  7 months and older: 52 - 157  This test was developed and its performance characteristics  determined by [facility]. It has not been cleared or approved  by the Food and Drug Administration.      aPTT 09/29/2023 26.9  sec Final    Comment: (NOTE)  This test has not been validated for monitoring  unfractionated heparin  therapy.  aPTT-based therapeutic  ranges for unfractionated heparin  therapy have not been  established.  Consider ordering Heparin  anti-Xa  (unfractionated).  Reference Range:  18 years and older: 22.9 - 30.2      APTT 1:1 Normal Plasma 09/29/2023 TEST NOT PERFORMED  sec Final    Comment: (NOTE)  Testing Not Indicated  This test was developed and its performance characteristics  determined by [facility]. It has not been cleared or approved  by the [place]  Food and Drug Administration.      aPTT 1:1 Saline 09/29/2023 TEST NOT PERFORMED  Sec  Final    Comment: (NOTE)  Testing Not Indicated  This test was developed and its performance characteristics  determined by [facility]. It has not been cleared or approved  by the [place]  Food and Drug Administration.      Lac Interpretation 09/29/2023 Comment    Final    Comment: (NOTE)  A lupus anticoagulant is not detected. All antiphospholipid  antibodies evaluated are normal. As antibody titers may  fluctuate with time, repeat testing may be indicated.  Please contact [facility] if further  clarification is needed.      Act. Prt C Resist w/FV Defic. 09/29/2023 2.8  ratio Final    Comment: (NOTE)  The APCR result may be falsely increased (masking an  abnormal, low APCR result) in patients on direct Xa  inhibitor (e.g., rivaroxaban , apixaban , edoxaban) or a  direct thrombin inhibitor (e.g., dabigatran) anticoagulant  therapy due to assay interference by these drugs.  Reference Range:  2.2 - 3.5      DRVVT Screen 09/29/2023 79.0 (H)  sec Final    Comment: (NOTE)  Reference Range:  <= 47.0      DRVVT Confirm Seconds 09/29/2023 58.4  sec Final    DRVVT  RATIO 09/29/2023 1.2  ratio Final    Comment: (NOTE)  Reference Range:  0.8 - 1.2      HEXAGONAL PHOSPHOLIPID NEUTRAL 500* 09/29/2023 5  sec Final    Comment: (NOTE)  This value is NEGATIVE.  This is a qualitative assay and is therefore reported as  positive for lupus anticoagulant or negative.  The  quantitative value is provided as an aid in diagnosis.  Reference Range:  0 - 11      CARDIOLIPIN AB IGG,CLPN1 09/29/2023 <10  GPL Final    Comment: (NOTE)  Reference Range:  Negative: <15  Indeterminate: 15 - 20  Low to medium positive: >20 - 80  High positive: >80      CARDIOLIPIN AB, IGM,CLPN2 09/29/2023 <10  MPL Final    Comment: (NOTE)  Reference Range:  Negative: <13  Indeterminate: 13 - 20  Low to medium positive: >20 - 80  High positive: >80      BETA-2 GLYCOPROTEIN I, IGG [id number] 09/29/2023 <10  SGU Final    Comment: (NOTE)  The reference interval reflects a 3SD  or 99th percentile  interval.  Reference Range:  Negative: <21      BETA-2 GLYCOPROTEIN I, IGM 499406 09/29/2023 <10  SMU Final    Comment: (NOTE)  The reference interval reflects a 3SD or 99th percentile  interval.  Reference Range:  Negative: <33      BETA-2 GLYCOPROTEIN I, BRYCE 499408 09/29/2023 <10  SAU Final    Comment: (NOTE)  The reference interval reflects a 3SD or 99th percentile  interval.  Reference Range:  Negative: <26      Factor II Gene Mutation 09/29/2023 Comment    Final    Comment: (NOTE)  G-G (Normal-Normal)  No prothrombin G20210A mutation present.      Interpretation 09/29/2023 Comment    Final    Comment: (NOTE)  While the patient does not possess this risk factor, other  thrombotic risk factors may be detected through systematic  clinical laboratory analysis.      METHODOLOGY 09/29/2023 Comment    Final    Comment: (NOTE)  Patient DNA was evaluated for the factor II gene mutation  at nucleotide 79789 using PCR amplification followed by  restriction analysis and gel electrophoresis.      Comments 09/29/2023 Comment    Final    Comment: (NOTE)  Simultaneous Risks: If a patient possesses two or more  congenital or acquired thrombophilic risk factors, the risk  of thrombosis may rise to more than the sum of the risk  ratios for the individual risk factors. For instance, a  combination of the prothrombin G20210A mutation and the  factor V Leiden mutation may confer an increase in  thrombotic risk in the range of 20-30 fold.  Recommendations for Genetic Counseling: The prothrombin  gene mutation is an inherited characteristic. If the  mutation is present, we recommend that the patient and  their family consider genetic counseling to obtain  additional information on inheritance and to identify other  family members at risk.  Testing Characteristics: Genetic testing provides  exceptionally high sensitivity and specificity. Inaccurate  results are limited to rare polymorphisms in primer binding  sites  and to misidentification of specimens by collectors  or laboratory personnel. This assay detects only the  prothrombin G2021                           0A mutation and does not  detect other  genetic abnormalities.  This test was developed and its performance characteristics  determined by [facility]. It has not been cleared or approved  by the Food and Drug Administration.  References: Delores POUR, et al. Br J of Haem. 8002;01:092.  Cumming AM, et al. Br J of Haem. 8002;01:646. Del  [place] and [place] Mol.Diagn. 2001;6(3):201.  Emmerich J, et al. Kerri Fish. 7998;13:190-83.  Margaglione M, et al. [place]. [phone].  Performed At: [facility]  [address] [place], [place] [id number]  Shermon Redell MANO MD Ey:[id number]      Sodium 09/29/2023 135 (L)  136 - 145 mmol/L Final    Potassium 09/29/2023 4.5  3.5 - 5.1 mmol/L Final    Chloride 09/29/2023 101  98 - 107 mmol/L Final    CO2 09/29/2023 21  20 - 29 mmol/L Final    Anion Gap 09/29/2023 13  7 - 16 mmol/L Final    Glucose 09/29/2023 112 (H)  70 - 99 mg/dL Final    Comment: <29 mg/dL Consistent with, but not fully diagnostic of hypoglycemia.  100 - 125 mg/dL Impaired fasting glucose/consistent with pre-diabetes mellitus.  > 126 mg/dl Fasting glucose consistent with overt diabetes mellitus      BUN 09/29/2023 17  8 - 23 MG/DL Final    Creatinine 92/92/7974 1.16  0.80 - 1.30 MG/DL Final    Est, Glom Filt Rate 09/29/2023 69  >60 ml/min/1.36m2 Final    Comment:    Pediatric calculator link: https://www.kidney.org/professionals/kdoqi/gfr_calculatorped     These results are not intended for use in patients <35 years of age.     eGFR results are calculated without a race factor using  the 2021 CKD-EPI equation. Careful clinical correlation is recommended, particularly when comparing to results calculated using previous equations.  The CKD-EPI equation is less accurate in patients with extremes of muscle mass, extra-renal metabolism of  creatinine, excessive creatine ingestion, or following therapy that affects renal tubular secretion.      Calcium 09/29/2023 9.4  8.8 - 10.2 MG/DL Final    Total Bilirubin 09/29/2023 1.3 (H)  0.0 - 1.2 MG/DL Final    ALT 92/92/7974 42  8 - 55 U/L Final    AST 09/29/2023 46 (H)  15 - 37 U/L Final    Alk Phosphatase 09/29/2023 68  40 - 129 U/L Final    Total Protein 09/29/2023 7.4  6.3 - 8.2 g/dL Final    Albumin 92/92/7974 3.9  3.2 - 4.6 g/dL Final    Globulin 92/92/7974 3.5  2.3 - 3.5 g/dL Final    Albumin/Globulin Ratio 09/29/2023 1.1  1.0 - 1.9   Final    WBC 09/29/2023 7.0  4.3 - 11.1 K/uL Final    RBC 09/29/2023 5.13  4.23 - 5.6 M/uL Final    Hemoglobin 09/29/2023 16.9  13.6 - 17.2 g/dL Final    Hematocrit 92/92/7974 46.9  41.1 - 50.3 % Final    MCV 09/29/2023 91.4  82.0 - 102.0 FL Final    MCH 09/29/2023 32.9  26.1 - 32.9 PG Final    MCHC 09/29/2023 36.0 (H)  31.4 - 35.0 g/dL Final    RDW 92/92/7974 15.5 (H)  11.9 - 14.6 % Final    Platelets 09/29/2023 69 (L)  150 - 450 K/uL Final    MPV 09/29/2023 11.9  9.4 - 12.3 FL Final    nRBC 09/29/2023 0.00  0.0 - 0.2 K/uL Final    **Note: Absolute NRBC parameter is now  reported with Hemogram**    Neutrophils % 09/29/2023 60.0  43.0 - 78.0 % Final    Lymphocytes % 09/29/2023 20.8  13.0 - 44.0 % Final    Monocytes % 09/29/2023 9.4  4.0 - 12.0 % Final    Eosinophils % 09/29/2023 8.3 (H)  0.5 - 7.8 % Final    Basophils % 09/29/2023 0.9  0.0 - 2.0 % Final    Immature Granulocytes % 09/29/2023 0.6  0.0 - 5.0 % Final    Neutrophils Absolute 09/29/2023 4.22  1.70 - 8.20 K/UL Final    Lymphocytes Absolute 09/29/2023 1.46  0.50 - 4.60 K/UL Final    Monocytes Absolute 09/29/2023 0.66  0.10 - 1.30 K/UL Final    Eosinophils Absolute 09/29/2023 0.58  0.00 - 0.80 K/UL Final    Basophils Absolute 09/29/2023 0.06  0.00 - 0.20 K/UL Final    Immature Granulocytes Absolute 09/29/2023 0.04  0.00 - 0.50 K/UL Final    Differential Type 09/29/2023 AUTOMATED    Final    AFP-Tumor Marker  09/29/2023 <1.82  0.00 - 8.30 ng/mL Final    Comment: Roche ECLIA methodology  Patient's results of tumor marker testing may not be comparable to labs using different manufacturers/methods.      Immature Plt. Fraction 09/29/2023 9.1 (H)  1.8 - 7.9 % Final    Haptoglobin 09/29/2023 71  30 - 200 mg/dL Final    Specimen hemolysis has exceeded the interference as defined by Roche. Value may be false increased.     LD 09/29/2023 181  127 - 281 U/L Final    Test Description: 09/29/2023 NEO   Final    Collection Information 09/29/2023 Collected for [facility]    Final    Date sent to Ref Lab 09/29/2023 SENT ON 09/29/23   Final    Protime 09/29/2023 15.0 (H)  11.3 - 14.9 sec Final    PLEASE NOTE NEW REFERENCE RANGE    INR 09/29/2023 1.2    Final    Comment: Suggested therapeutic INR range:  Venous thrombosis and embolus  2.0-3.0  Prosthetic heart valve         2.5-3.5  ** Note new reference range and method **      APTT 09/29/2023 31.0  23.3 - 37.4 SEC Final    Comment: Heparin  Therapeutic Range = 74 - 123 seconds  When clinically evaluating a  prolonged PTT, preanalytic variables  including possible fluctuations in  levels of heparin  should be considered.  PLEASE NOTE NEW REFERENCE RANGE      HIV 1/2 Interp 09/29/2023 NONREACTIVE  NR   Final    HIV 1/2 Result Comment 09/29/2023 While this assay is highly sensitive, a non-reactive result for HIV antibodies HIV-1 and HIV-2 and p24 antigen does not preclude the possiblity of exposure to or infection with HIV.    Final    Test performed using the Roche Elecys HIV DUO assay.    Hep A IgM 09/29/2023 NONREACTIVE  NR   Final    Hep B Core Ab, IgM 09/29/2023 NONREACTIVE  NR   Final    Hepatitis B Surface Ag 09/29/2023 NONREACTIVE  NR   Final    Hepatitis C Ab 09/29/2023 NONREACTIVE  NR   Final    Hep B S Ab 09/29/2023 <3.50  mIU/mL Final    Comment:    <8.50 mIU/mL      Non-immune  8.50-11.50 mIU/mL Indeterminate  >11.50 mIU/mL     Immune to infection with HBV  ANA 09/29/2023  Negative  Negative   Final    Comment: (NOTE)  Performed At: [facility]  [address] [place], [place] [id number]  Jennette Shorter MD Ey:[id number]      Iron 09/29/2023 118 (H)  35 - 100 ug/dL Final    TIBC 92/92/7974 282  240 - 450 ug/dL Final    Iron % Saturation 09/29/2023 42  20 - 50 % Final    UIBC 09/29/2023 164.0  112.0 - 347.0 ug/dL Final    Ferritin 92/92/7974 74  8 - 388 NG/ML Final    ERYTHROPOIETIN 09/29/2023 28.4 (H)  2.6 - 18.5 mIU/mL Final    Comment: (NOTE)  [employer] Coul[employer] UniCel DxI 800 Immunoassay System  Values obtained with different assay methods or kits cannot be used  interchangeably. Results cannot be interpreted as absolute evidence  of the presence or absence of malignant disease.  Performed At: [facility]  [address] [place], [place] [id number]  Jennette Shorter MD Ey:[id number]      Zinc 09/29/2023 63  44 - 115 ug/dL Final    Comment: (NOTE)  This test was developed and its performance characteristics  determined by [facility]. It has not been cleared or approved  by the Food and Drug Administration.                                 Detection Limit = 5  Performed At: [facility]  [address] [place], [place] [id number]  Jennette Shorter MD Ey:[id number]      Copper 09/29/2023 82  69 - 132 ug/dL Final    Comment: (NOTE)  This test was developed and its performance characteristics  determined by [facility]. It has not been cleared or approved  by the Food and Drug Administration.                                 Detection Limit = 5  Performed At: [facility]  [address] [place], [place] [id number]  Jennette Shorter MD Ey:[id number]      Pathologist Review 09/29/2023 (NOTE)   Final    Comment:   RBCs and WBCs- Morphologically unremarkable  PLTs- Decreased in number; Morphologically unremarkable      Madonna Edman, MD, PhD                     Please refer to After Visit Summary or MyChart for upcoming appointment information. If you have any questions  regarding your upcoming schedule please reach out to your care team through MyChart or call [phone].    Please notify your assigned Nurse Navigator of any unplanned hospital admissions or Emergency Room visits within 24 hours of discharge.    -------------------------------------------------------------------------------------------------------------------  Please call our office at [phone] if you have any  of the following symptoms:   Fever of 100.5 or greater  Chills  Shortness of breath  Swelling or pain in one leg    After office hours an answering service is available and will contact a provider for emergencies or if you are experiencing any of the above symptoms.        JOHANA POSTAL, RN

## 2023-11-02 NOTE — Progress Notes (Signed)
 Chad Rosario  August 24, 1955 is a 68 y.o. male ,Established patient, here for evaluation of the following chief complaint(s):   Chief Complaint   Patient presents with    Follow-up     Pt is fasting--did just have a lot of blood work in July done at a different office, dose have VV tomorrow with that provider to discuss results-- left leg has a spot on the back of his calf that he would like to make sure isn't infected          1. Skin infection  -     mupirocin  (BACTROBAN ) 2 % ointment; Apply topically 3 times daily., Disp-22 g, R-2, Normal  2. Peripheral polyneuropathy  -     [facility]  3. Gout, unspecified cause, unspecified chronicity, unspecified site  -     Uric Acid; Future  4. Other hyperlipidemia  -     Lipid Panel; Future  -     Hemoglobin A1C; Future  5. Prediabetes  -     Hemoglobin A1C; Future        No follow-ups on file.        Subjective   SUBJECTIVE/OBJECTIVE:  Leg Pain   The incident occurred more than 1 week ago. There was no injury mechanism. The pain is moderate. The pain has been Fluctuating since onset. Associated symptoms include a loss of sensation, numbness and tingling.   Hypertension  This is a chronic problem. The current episode started more than 1 year ago. The problem has been gradually improving since onset. The problem is controlled. Pertinent negatives include no anxiety, blurred vision, chest pain, headaches, palpitations, peripheral edema, PND or shortness of breath.       Review of Systems   Constitutional:  Negative for chills and fever.   HENT:  Negative for ear pain, hearing loss and sore throat.    Eyes:  Negative for blurred vision, photophobia and pain.   Respiratory:  Negative for cough and shortness of breath.    Cardiovascular:  Negative for chest pain, palpitations, leg swelling and PND.   Gastrointestinal:  Negative for abdominal distention, abdominal pain, blood in stool and nausea.   Genitourinary:  Negative for dysuria and urgency.    Musculoskeletal:  Negative for joint swelling and myalgias.   Skin:  Negative for color change, pallor and rash.   Neurological:  Positive for tingling and numbness. Negative for speech difficulty, weakness, light-headedness and headaches.   Hematological:  Negative for adenopathy.   Psychiatric/Behavioral:  Negative for agitation, confusion, hallucinations, self-injury and suicidal ideas.           Objective   Physical Exam  Constitutional:       Appearance: Normal appearance.   HENT:      Head: Normocephalic and atraumatic.      Nose: Nose normal.   Eyes:      Extraocular Movements: Extraocular movements intact.      Conjunctiva/sclera: Conjunctivae normal.      Pupils: Pupils are equal, round, and reactive to light.   Cardiovascular:      Rate and Rhythm: Normal rate and regular rhythm.      Pulses: Normal pulses.      Heart sounds: Normal heart sounds.   Pulmonary:      Effort: Pulmonary effort is normal.      Breath sounds: Normal breath sounds.   Abdominal:      General: Abdomen is flat. Bowel sounds are normal.  Palpations: Abdomen is soft.   Skin:     General: Skin is warm and dry.   Neurological:      General: No focal deficit present.      Mental Status: He is alert and oriented to person, place, and time.   Psychiatric:         Mood and Affect: Mood normal.                   An electronic signature was used to authenticate this note.    --Ozell Cyndee Mare, MD

## 2023-11-05 ENCOUNTER — Telehealth: Admit: 2023-11-05 | Discharge: 2023-11-05 | Payer: MEDICARE | Attending: Physician Assistant | Primary: Family Medicine

## 2023-11-05 DIAGNOSIS — K121 Other forms of stomatitis: Principal | ICD-10-CM

## 2023-11-05 MED ORDER — MAGIC MOUTHWASH
Freq: Four times a day (QID) | TOPICAL | 0 refills | 4.00000 days | Status: DC | PRN
Start: 2023-11-05 — End: 2023-11-10

## 2023-11-05 NOTE — Progress Notes (Signed)
 Chad Rosario, was evaluated through a synchronous (real-time) audio-video encounter. The patient (or guardian if applicable) is aware that this is a billable service, which includes applicable co-pays. This Virtual Visit was conducted with patient's (and/or legal guardian's) consent. Patient identification was verified, and a caregiver was present when appropriate.   The patient was located at Home: [address] [facility] [address]  Provider was located at Facility (Appt Dept): [address] [place] [address]  Confirm you are appropriately licensed, registered, or certified to deliver care in the state where the patient is located as indicated above. If you are not or unsure, please re-schedule the visit: Yes, I confirm.     Chad Rosario (DOB:  October 17, 1955) is a Established patient, presenting virtually for evaluation of the following:      Below is the assessment and plan developed based on review of pertinent history, physical exam, labs, studies, and medications.     Assessment & Plan  Stomatitis   Acute condition, new, not real bothersome now, will wait a few days to see if resolves. Keep hydrated, avoid citrus/acidic/salty foods. If continued symptoms try magic mouthwash, if still persistent symptoms need OV for in person exam.     Orders:    Magic Mouthwash (MIRACLE MOUTHWASH); Swish and spit 5 mLs 4 times daily as needed for Irritation      No follow-ups on file.       Subjective   I am seeing Chad Rosario today for acute care visit. He reports a sore on the roof of his mouth (back right) ongoing for 1 week. No affecting swallowing/eating but thought he should have it checked out. No facial or mouth swelling      Review of Systems   Constitutional:  Negative for chills and fever.   HENT:  Positive for mouth sores.    Respiratory:  Negative for shortness of breath.    Cardiovascular:  Negative for chest pain.   Gastrointestinal:  Negative for abdominal pain.   Neurological:   Negative for dizziness and headaches.          Objective   Patient-Reported Vitals  No data recorded     Physical Exam  [INSTRUCTIONS:  [x]  Indicates a positive item  []  Indicates a negative item  -- DELETE ALL ITEMS NOT EXAMINED]    Constitutional: [x]  Appears well-developed and well-nourished [x]  No apparent distress      []  Abnormal -     Mental status: [x]  Alert and awake  [x]  Oriented to person/place/time [x]  Able to follow commands    []  Abnormal -     Eyes:   EOM    [x]   Normal    []  Abnormal -   Sclera  [x]   Normal    []  Abnormal -          Discharge [x]   None visible   []  Abnormal -     HENT: [x]  Normocephalic, atraumatic  [x]  Abnormal - limited view on view from camera, but what looks to be a small apthous ulcer to right upper palate. No facial swelling or drooling noted  [x]  Mouth/Throat: Mucous membranes are moist    External Ears [x]  Normal  []  Abnormal -    Neck: [x]  No visualized mass []  Abnormal -     Pulmonary/Chest: [x]  Respiratory effort normal   [x]  No visualized signs of difficulty breathing or respiratory distress        []  Abnormal -  Musculoskeletal:   [x]  Normal gait with no signs of ataxia         [x]  Normal range of motion of neck        []  Abnormal -     Neurological:        [x]  No Facial Asymmetry (Cranial nerve 7 motor function) (limited exam due to video visit)          [x]  No gaze palsy        []  Abnormal -          Skin:        [x]  No significant exanthematous lesions or discoloration noted on facial skin         []  Abnormal -            Psychiatric:       [x]  Normal Affect []  Abnormal -        [x]  No Hallucinations    Other pertinent observable physical exam findings:-             --Takeysha Bonk ANN Hadja Harral, PA

## 2023-11-07 NOTE — Telephone Encounter (Signed)
 [facility] called on pts behalf stating they need the ingredients for the magic mouthwash pt was prescribed and how much of each ingredient

## 2023-11-07 NOTE — Telephone Encounter (Signed)
 I believe that you saw him for this

## 2023-11-10 MED ORDER — MAGIC MOUTHWASH
Freq: Four times a day (QID) | 0 refills | 3.00000 days | Status: AC | PRN
Start: 2023-11-10 — End: ?

## 2023-11-10 NOTE — Telephone Encounter (Signed)
Update rx

## 2023-11-11 ENCOUNTER — Ambulatory Visit: Admit: 2023-11-11 | Discharge: 2023-11-11 | Payer: MEDICARE | Attending: Family Medicine | Primary: Family Medicine

## 2023-11-11 VITALS — BP 120/70 | HR 57 | Temp 98.30000°F | Ht 73.0 in | Wt 295.0 lb

## 2023-11-11 DIAGNOSIS — K121 Other forms of stomatitis: Principal | ICD-10-CM

## 2023-11-11 MED ORDER — AMOXICILLIN 875 MG PO TABS
875 | ORAL_TABLET | Freq: Two times a day (BID) | ORAL | 0 refills | 7.00000 days | Status: AC
Start: 2023-11-11 — End: 2023-11-21

## 2023-11-11 MED ORDER — MAGIC MOUTHWASH
Freq: Four times a day (QID) | TOPICAL | 0 refills | 2.00000 days | Status: DC | PRN
Start: 2023-11-11 — End: 2024-02-17

## 2023-11-11 MED ORDER — MAGIC MOUTHWASH
Freq: Four times a day (QID) | TOPICAL | 1 refills | 1.00000 days | Status: DC | PRN
Start: 2023-11-11 — End: 2023-11-11

## 2023-11-11 NOTE — Telephone Encounter (Signed)
 Medicine was sent to different pharmacy to be filled I have now canceled that prescription

## 2023-11-11 NOTE — Telephone Encounter (Signed)
 Pharmacy called stating they need an ingredient list for the following medication     Magic Mouthwash (MIRACLE MOUTHWASH) [[id number]]

## 2023-11-13 NOTE — Progress Notes (Signed)
 Chad Rosario  09/15/1955 is a 68 y.o. male ,Established patient, here for evaluation of the following chief complaint(s):   Chief Complaint   Patient presents with    Oral Pain     Has a sore on the roof of his mouth, also having head pressure and ears-- this has been going on for about 2 weeks now--at first the sore in his mouth hurt with everything he would eat or drink now its only cold things          1. Stomatitis  -     Magic Mouthwash (MIRACLE MOUTHWASH); Swish and spit 5 mLs 4 times daily as needed for Irritation 50 mL diphenydramine hcl, 50 mL Maalox, 50 mL lidocaine  HCL, Disp-150 mL, R-0Normal  2. Lesion of mouth  3. Acute bacterial sinusitis  -     amoxicillin  (AMOXIL ) 875 MG tablet; Take 1 tablet by mouth 2 times daily for 10 days, Disp-20 tablet, R-0Normal  -     amoxicillin  (AMOXIL ) 875 MG tablet; Take 1 tablet by mouth 2 times daily for 10 days, Disp-20 tablet, R-0Normal        Return in about 3 months (around 02/11/2024), or if symptoms worsen or fail to improve.        Subjective   SUBJECTIVE/OBJECTIVE:  Sore in roof mouth    Oral Pain   This is a new problem. The current episode started in the past 7 days. The problem occurs daily. The problem has been unchanged. The pain is mild. Pertinent negatives include no fever.   Sinus Problem  This is a new problem. The current episode started 1 to 4 weeks ago. The problem is unchanged. There has been no fever. His pain is at a severity of 0/10. Pertinent negatives include no chills, coughing, ear pain, headaches, shortness of breath or sore throat.       Review of Systems   Constitutional:  Negative for chills and fever.   HENT:  Negative for ear pain, hearing loss and sore throat.    Eyes:  Negative for photophobia and pain.   Respiratory:  Negative for cough and shortness of breath.    Cardiovascular:  Negative for chest pain, palpitations and leg swelling.   Gastrointestinal:  Negative for abdominal distention, abdominal pain, blood in stool  and nausea.   Genitourinary:  Negative for dysuria and urgency.   Musculoskeletal:  Negative for joint swelling and myalgias.   Skin:  Negative for color change, pallor and rash.   Neurological:  Negative for speech difficulty, weakness, light-headedness and headaches.   Hematological:  Negative for adenopathy.   Psychiatric/Behavioral:  Negative for agitation, confusion, hallucinations, self-injury and suicidal ideas.           Objective   Physical Exam  Constitutional:       Appearance: Normal appearance.   HENT:      Head: Normocephalic and atraumatic.      Nose: Nose normal.   Eyes:      Extraocular Movements: Extraocular movements intact.      Conjunctiva/sclera: Conjunctivae normal.      Pupils: Pupils are equal, round, and reactive to light.   Cardiovascular:      Rate and Rhythm: Normal rate and regular rhythm.      Pulses: Normal pulses.      Heart sounds: Normal heart sounds.   Pulmonary:      Effort: Pulmonary effort is normal.      Breath sounds: Normal breath sounds.  Abdominal:      General: Abdomen is flat. Bowel sounds are normal.      Palpations: Abdomen is soft.   Skin:     General: Skin is warm and dry.   Neurological:      General: No focal deficit present.      Mental Status: He is alert and oriented to person, place, and time.   Psychiatric:         Mood and Affect: Mood normal.                   An electronic signature was used to authenticate this note.    --Ozell Cyndee Mare, MD

## 2023-11-19 ENCOUNTER — Encounter

## 2023-11-28 ENCOUNTER — Encounter

## 2023-11-29 MED ORDER — ZOLPIDEM TARTRATE 5 MG PO TABS
5 | ORAL_TABLET | Freq: Every evening | ORAL | 2 refills | Status: AC
Start: 2023-11-29 — End: 2024-02-26

## 2023-12-10 MED ORDER — ALLOPURINOL 300 MG PO TABS
300 | ORAL_TABLET | Freq: Every day | ORAL | 11 refills | 90.00000 days | Status: AC
Start: 2023-12-10 — End: ?

## 2023-12-14 ENCOUNTER — Encounter

## 2023-12-25 ENCOUNTER — Ambulatory Visit: Admit: 2023-12-25 | Discharge: 2023-12-25 | Payer: MEDICARE | Attending: Family Medicine | Primary: Family Medicine

## 2023-12-25 VITALS — BP 120/70 | HR 82 | Temp 98.20000°F | Ht 73.0 in | Wt 296.0 lb

## 2023-12-25 DIAGNOSIS — H6593 Unspecified nonsuppurative otitis media, bilateral: Principal | ICD-10-CM

## 2023-12-25 MED ORDER — AMOXICILLIN-POT CLAVULANATE 875-125 MG PO TABS
875-125 | ORAL_TABLET | Freq: Two times a day (BID) | ORAL | 0 refills | 10.00000 days | Status: AC
Start: 2023-12-25 — End: 2024-01-01

## 2023-12-25 NOTE — Progress Notes (Signed)
 Have you been to the ER, urgent care clinic since your last visit?  Hospitalized since your last visit?   NO    Have you seen or consulted any other health care providers outside our system since your last visit?   YES - When: approximately 2 months ago.  Where and Why: [facility] ,Bilateral tinnitus .

## 2023-12-27 NOTE — Assessment & Plan Note (Signed)
"   Monitored by specialist- no acute findings meriting change in the plan         "

## 2023-12-27 NOTE — Progress Notes (Signed)
 "  Chad Rosario  24-Jul-1955 is a 68 y.o. male ,Established patient, here for evaluation of the following chief complaint(s):   Chief Complaint   Patient presents with    Follow-up     Would like to have ears checked you did call in antibiotic--          1. Bilateral serous otitis media, unspecified chronicity  -     amoxicillin -clavulanate (AUGMENTIN ) 875-125 MG per tablet; Take 1 tablet by mouth 2 times daily for 7 days, Disp-14 tablet, R-0Normal  -     External Referral To ENT  2. Atrial fibrillation with RVR (HCC)  Assessment & Plan:   Chronic, at goal (stable), continue current treatment plan--is also followed by cardiology  3. Thoracic aortic aneurysm without rupture, unspecified part  Assessment & Plan:   Monitored by specialist- no acute findings meriting change in the plan        Return in about 4 months (around 04/26/2024).        Subjective   SUBJECTIVE/OBJECTIVE:  Ear Fullness   There is pain in both ears. This is a recurrent problem. The current episode started more than 1 month ago. The problem has been unchanged. There has been no fever. Pertinent negatives include no abdominal pain, coughing, headaches, hearing loss, rash or sore throat.   Sinus Problem  This is a chronic problem. The current episode started more than 1 month ago. The problem is unchanged. Pertinent negatives include no chills, coughing, ear pain, headaches, shortness of breath or sore throat.       Review of Systems   Constitutional:  Negative for chills and fever.   HENT:  Negative for ear pain, hearing loss and sore throat.    Eyes:  Negative for photophobia and pain.   Respiratory:  Negative for cough and shortness of breath.    Cardiovascular:  Negative for chest pain, palpitations and leg swelling.   Gastrointestinal:  Negative for abdominal distention, abdominal pain, blood in stool and nausea.   Genitourinary:  Negative for dysuria and urgency.   Musculoskeletal:  Negative for joint swelling and myalgias.   Skin:  Negative  for color change, pallor and rash.   Neurological:  Negative for speech difficulty, weakness, light-headedness and headaches.   Hematological:  Negative for adenopathy.   Psychiatric/Behavioral:  Negative for agitation, confusion, hallucinations, self-injury and suicidal ideas.           Objective   Physical Exam  Constitutional:       Appearance: Normal appearance.   HENT:      Head: Normocephalic and atraumatic.      Nose: Nose normal.   Eyes:      Extraocular Movements: Extraocular movements intact.      Conjunctiva/sclera: Conjunctivae normal.      Pupils: Pupils are equal, round, and reactive to light.   Cardiovascular:      Rate and Rhythm: Normal rate and regular rhythm.      Pulses: Normal pulses.      Heart sounds: Normal heart sounds.   Pulmonary:      Effort: Pulmonary effort is normal.      Breath sounds: Normal breath sounds.   Abdominal:      General: Abdomen is flat. Bowel sounds are normal.      Palpations: Abdomen is soft.   Skin:     General: Skin is warm and dry.   Neurological:      General: No focal deficit present.  Mental Status: He is alert and oriented to person, place, and time.   Psychiatric:         Mood and Affect: Mood normal.                   An electronic signature was used to authenticate this note.    --Ozell Cyndee Mare, MD   "

## 2023-12-27 NOTE — Assessment & Plan Note (Signed)
"   Chronic, at goal (stable), continue current treatment plan--is also followed by cardiology  "

## 2023-12-29 ENCOUNTER — Other Ambulatory Visit: Admit: 2023-12-29 | Discharge: 2023-12-29 | Payer: MEDICARE | Primary: Family Medicine

## 2023-12-29 DIAGNOSIS — M109 Gout, unspecified: Principal | ICD-10-CM

## 2023-12-29 LAB — LIPID PANEL
Chol/HDL Ratio: 3.7 (ref 0.0–5.0)
Cholesterol, Total: 151 mg/dL (ref 0–200)
HDL: 41 mg/dL (ref 40–60)
LDL Cholesterol: 82 mg/dL (ref 0–100)
Triglycerides: 138 mg/dL (ref 0–150)
VLDL Cholesterol Calculated: 28 mg/dL — ABNORMAL HIGH (ref 6–23)

## 2023-12-29 LAB — HEMOGLOBIN A1C
Estimated Avg Glucose: 104 mg/dL
Hemoglobin A1C: 5.2 % (ref 0–5.6)

## 2023-12-29 LAB — URIC ACID: Uric Acid: 6.1 mg/dL (ref 3.9–8.2)

## 2023-12-30 MED ORDER — DOXYCYCLINE HYCLATE 100 MG PO TABS
100 | ORAL_TABLET | Freq: Two times a day (BID) | ORAL | 0 refills | Status: AC
Start: 2023-12-30 — End: 2024-01-09

## 2023-12-30 NOTE — Telephone Encounter (Signed)
"  I HAVE SENT IN ANOTHER ANTIBIOTIC  "

## 2024-01-26 ENCOUNTER — Ambulatory Visit: Admit: 2024-01-26 | Discharge: 2024-01-26 | Payer: MEDICARE | Attending: Neurology | Primary: Family Medicine

## 2024-01-26 NOTE — Progress Notes (Signed)
 "    [facility] FOLLOW-UP NOTE    Patient: Chad Rosario  Physician: Lorane Lavender, MD    CC:   Chief Complaint   Patient presents with    Follow-up     Pt reports doing worse since the last appt. He stated that walking is getting worse with discomfort.       PCP: Clair Sharper Meth, MD  Referring Provider: No ref. provider found    History of Present Illness:     Interval History on 01/26/2024:  Chad Rosario is a 68 y.o. right-handed male who presents for follow-up management of peripheral polyneuropathy.  Patient reports he has been having more issues with his walking when he walks and his legs are becoming fatigued.  He needs to stop walking and go back to the house so he can rest and then he can resume walking.  He says he has no problems walking when he has something to hold onto like a shopping cart.  He has been seeing Dr. Josetta neurosurgeon postlaminectomy.  Most recent imaging modality was x-ray.  Have not had MRI L-spine done for a while.  He does not report significant worsening of numbness or neuropathic pain in his feet.      Interval History on 07/04/2023:  Chad Rosario is a 68 y.o. right-handed male who presents for follow-up management of peripheral polyneuropathy and neuropathic pain.  Patient reports he is doing about the same.  No significant worsening.  He was recently diagnosed with atrial fibrillation, underwent ablation and was started on anticoagulation.  He has been experiencing swelling in the lower extremities for which he wears compression stockings and  it helps.  He is back on gabapentin  600 mg 4 times a day.  He had more side effects with that medication so he prefers to stay on this medicine.  He has no other questions or concerns.        Interval History on 10/16/2022:  Chad Rosario is a 68 y.o. 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  He is overall doing well.  He reports subjectively fluctuating  numbness and imbalance.  Reports side effects with current dose of Gabapentin  600mg  QID, he is very fatigued. Works with PT. No falls, uses cane for security to prevent falling down.   Previous workup was unrevealing in terms of etiology of his polyneuropathy.  B12 was borderline at 295 in 2019, we discussed repeating it today.   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  This findings correlate with patient's clinical picture.  Discussed with patient patient can take to prevent further damage.  Discussed prognosis for his neuropathy.        Original HPI 05/21/21  68 year old married right-handed white male with many problems.  He is sent for gait disorder weakness of his legs and feet bilateral foot drop polyneuropathy.  He was seen over the years and finally was seen by neuropathy evaluator Dr. Milo at [facility].     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     He notes that his dad actually had weakness onset his last 2 years of life in his 61s mid 52s to late 55s of severe weakness at his feet to the point of needing a wheelchair.  His parents are succumbed by this time and there is no other family history of such.  Apparently his dad had pes planus.  Patient states that he has more of a pes cavus.  Idiopathic polyneuropathy = = with stumbling and falling he does have a placard for his car he does have a cane that he did not bring with him as he comes unaccompanied today.  He notes that he always has to have the lights on at night has a moderate degree to a severe degree of gait disturbance and falling but no incontinence.                      right handed 68 y.o.    married Caucasian male unaccompanied = = with many many underlying problems including progressive peripheral neuropathy/polyneuropathy probably mixed idiopathic and possibly with a component of inherited neuropathy such as what his father  had which would suspect CMT.        Review of Systems:   All systems were reviewed and relevant findings are addressed within the documentation.   Neurological review of systems per HPI.     Lab/Imaging Review:   I REVIEWED PERTINENT LABS, IMAGES, AND REPORTS WITH THE PATIENT PERSONALLY, DIRECTLY AND FULLY. THE MOST PERTINENT FINDINGS ARE NOTED BELOW:    MRI Result (most recent):  MRI KNEE LEFT WO CONTRAST 01/18/2022    Narrative  ---------------------------------------------    EXAMINATION: MRI KNEE LEFT WITHOUT CONTRAST 01/18/2022 7:45 AM EDT    ACCESSION NUMBER: [id number]    COMPARISON: Radiographs performed January 16, 2022.    INDICATION: Left knee pain x3 weeks    TECHNIQUE: Dedicated MRI of the left knee was performed with multiplanar multiecho technique  Technical note: Evaluation is degraded by patient motion.    FINDINGS:    MEDIAL MENISCUS: High-grade radial type tear of the posterior root attachment with mild extrusion of the body segment.  LATERAL MENISCUS:  Intact.    ACL:  Intact.  PCL:  Intact.  MCL:  Intact.  LATERAL LIGAMENTS AND TENDONS:  Intact.    EXTENSOR MECHANISM:  The quadriceps and patellar tendons are intact.    FAT PADS:   Postsurgical scarring within Hoffa's fat.    CARTILAGE:  Patellofemoral compartment: Mild surface irregularity. Suggestion of partial-thickness chondral fissuring within the inferior aspect of the patellar apex.  Medial compartment: Mild diffuse chondral thinning and surface irregularity. Focus of full-thickness chondral loss at the mid weightbearing surface of the medial femoral condyle with subtle subchondral marrow reactive edema.  Lateral compartment: Intact.    BONE MARROW: Partially imaged intramedullary rod within the tibia with proximal fixation screw. No fracture or marrow replacing process.    OTHER: Small joint effusion. Mild anterior predominant subcutaneous soft tissue edema. Suggestion of mild fatty infiltration of the semimembranosus  muscle.    Impression  IMPRESSION:    1.  High-grade radial type tear of the posterior root attachment medial meniscus with mild extrusion of the body segment.  2.  Mild osteoarthritic changes, as above, with a focus of full-thickness chondral fissuring within the medial femoral condyle.  3.  Cruciate and collateral ligaments are intact.  4.  Small joint effusion.     CT Result (most recent):  CTA CHEST W WO CONTRAST 08/27/2023    Narrative  Indication:  Thoracic aortic aneurysm, without rupture, unspecified.    Technique: 2D Axial images of the chest were obtained after the administration  of 100 ml Isovue  370 intravenous iodinated contrast media. Contrast was used to  best identify solid structures.  Images also viewed on an independent  workstation including 3D rendering.  All CT scans at this facility are performed using dose reduction/dose modulation  techniques, as appropriate the performed exam, including the following:  Automated Exposure Control; Adjustment of the mA and/or kV according to patient  size (this includes techniques or standardized protocols for targeted exams  where dose is matched to indication/reason for exam); and Use of Iterative  Reconstruction Technique.    Comparison: None    Findings:    Neck base: Normal  Lungs: Normal  Mediastinum: Normal.  Cardiac: Postoperative changes.  Pulmonary Arteries:  Normal  Esophagus: Normal  CHEST WALL: Previous sternotomy  Bones: Degenerative and postoperative changes  Upper abdomen: Normal    Aorta: Level of the sinuses measures 4.4 cm. Sinotubular junction measures 2.8  cm. Maximum size of the ascending thoracic aorta measures 4.3 x 3.8 cm.  Descending aorta measures 2.8 cm.  Vascular/Other: Three-vessel arch.    Impression  1. Mildly prominent ascending thoracic aorta.    Electronically signed by TORIBIO LELON BUTCHER     Cardiology (most recent):  08/19/23    ECHO (TTE) COMPLETE (PRN CONTRAST/BUBBLE/STRAIN/3D) 08/19/2023  8:47 AM  (Final)    Interpretation Summary    Left Ventricle: Normal left ventricular systolic function with a visually estimated EF of 55 - 60%. The LV is poorly visualized in the PLAX view such that LV wall thickness and LV size cannot be accurately determined on the study.    Signed by: Arthea CHRISTELLA Lingo, MD on 08/19/2023  8:47 AM       Past Medical History:   Past medical history, surgical history, social history, family history, medications, and allergies were reviewed and updated as appropriate.     PAST MEDICAL HISTORY:  Past Medical History:   Diagnosis Date    CAD (coronary artery disease) 2015    Cataract 2017    Cervical disc disorder     Colon polyp 2007    Concussion 1968    Gout 03/2015    Hypertension     controlled with meds    Low back pain Jan 2002    Lumbosacral disc disease March 2003    Neuromuscular disorder [facility]) 2019    Neuropathy May 2019    Osteoarthritis 03/2013    Plantar fasciitis 1995    Skin cancer 2016    Sleep apnea     cpap at night    Spinal stenosis 03/2001    Tendinitis 2015       PAST SURGICAL HISTORY:   Past Surgical History:   Procedure Laterality Date    BACK SURGERY  2021    has had 3 different ones last was in 2021    CARDIAC CATHETERIZATION  2016    CARDIAC SURGERY      CABG    CAROTID STENT PLACEMENT  2015    CARPAL TUNNEL RELEASE  Aug 2019 & Dec 2019    CHOLECYSTECTOMY      COLONOSCOPY N/A 11/29/2020    COLONOSCOPY POLYPECTOMY HOT SNARE performed by Randalyn Romans, MD at [facility] ENDOSCOPY    COLOSTOMY  2007    EP DEVICE PROCEDURE N/A 05/22/2023    Ablation A-fib w complete ep study performed by Carlon Reyes PARAS, MD at [facility] CARDIAC CATH LAB    EYE SURGERY Left     cataract    FRACTURE SURGERY Left     tib-fib fracture    JOINT REPLACEMENT  Hip - 2018    KNEE ARTHROSCOPY Right     LUMBAR FUSION  Dec 2021  LUMBAR LAMINECTOMY  March 2003    SPINAL FUSION  2020    TOTAL HIP ARTHROPLASTY Left        FAMILY HISTORY:  Family History   Problem Relation Age of Onset    Cancer Mother         Lymp     Stroke Father     Heart Surgery Father     Diabetes Father     Heart Disease Father     Cancer Sister         Breast    Clotting Disorder Maternal Grandfather         SOCIAL HISTORY:  Social History     Socioeconomic History    Marital status: Married     Spouse name: None    Number of children: None    Years of education: None    Highest education level: None   Tobacco Use    Smoking status: Never    Smokeless tobacco: Never   Vaping Use    Vaping status: Never Used   Substance and Sexual Activity    Alcohol use: Yes     Alcohol/week: 12.0 standard drinks of alcohol     Types: 6 Cans of beer, 6 Shots of liquor per week     Comment: socially    Drug use: Never    Sexual activity: Not Currently     Partners: Female     Social Drivers of Health     Financial Resource Strain: Low Risk  (04/09/2022)    Received from [facility]    Overall Financial Resource Strain (CARDIA)     Difficulty of Paying Living Expenses: Not hard at all   Food Insecurity: No Food Insecurity (05/21/2023)    Hunger Vital Sign     Worried About Running Out of Food in the Last Year: Never true     Ran Out of Food in the Last Year: Never true   Transportation Needs: No Transportation Needs (05/21/2023)    PRAPARE - Therapist, Art (Medical): No     Lack of Transportation (Non-Medical): No   Physical Activity: Insufficiently Active (08/05/2023)    Exercise Vital Sign     Days of Exercise per Week: 3 days     Minutes of Exercise per Session: 30 min   Stress: No Stress Concern Present (04/09/2022)    Received from [facility] - Occupational Stress Questionnaire     Feeling of Stress : Not at all   Social Connections: Socially Integrated (04/09/2022)    Received from [facility]    Social Network     How would you rate your social network (family, work, friends)?: Good participation with social networks   Intimate Partner Violence: Not At Risk (04/09/2022)    Received from [facility]    HITS     Over the last 12 months how often did your partner physically hurt you?: Never     Over the last 12 months how often did your partner insult you or talk down to you?: Never     Over the last 12 months how often did your partner threaten you with physical harm?: Never     Over the last 12 months how often did your partner scream or curse at you?: Never   Housing Stability: Low Risk  (05/21/2023)    Housing Stability Vital Sign     Unable to Pay for  Housing in the Last Year: No     Number of Times Moved in the Last Year: 0     Homeless in the Last Year: No         Medications/Allergies:   MEDICATIONS:   Outpatient Encounter Medications as of 01/26/2024   Medication Sig Dispense Refill    vitamin B-6 (PYRIDOXINE ) 50 MG tablet Take 1 tablet by mouth daily      allopurinol  (ZYLOPRIM ) 300 MG tablet TAKE 1 TABLET BY MOUTH DAILY 30 tablet 11    zolpidem  (AMBIEN ) 5 MG tablet Take 1 tablet by mouth at bedtime for 90 days. Max Daily Amount: 5 mg (Patient taking differently: Take 1 tablet by mouth nightly as needed.) 30 tablet 2    Magic Mouthwash (MIRACLE MOUTHWASH) Swish and spit 5 mLs 4 times daily as needed for Irritation 50 mL diphenydramine hcl, 50 mL Maalox, 50 mL lidocaine  HCL 150 mL 0    apixaban  (ELIQUIS ) 5 MG TABS tablet Take 1 tablet by mouth 2 times daily      metoprolol  succinate (TOPROL  XL) 25 MG extended release tablet Take 1 tablet by mouth daily 90 tablet 3    azelastine (ASTEPRO) 137 MCG/SPRAY nasal spray USE 1 TO 2 SPRAYS IN EACH NOSTRIL TWICE DAILY AS NEEDED AS DIRECTED      lisinopril  (PRINIVIL ;ZESTRIL ) 20 MG tablet Take 2 tablets by mouth daily (Patient taking differently: Take 1 tablet by mouth daily) 180 tablet 1    furosemide  (LASIX ) 40 MG tablet Take 1 tablet by mouth daily as needed (take one tablet daily for weight garin greater than 2 lbs in 24 hours or 4 lbs in 48 hours.) 30 tablet 3    Cyanocobalamin (VITAMIN B 12 PO) Take by mouth every morning      gabapentin  (NEURONTIN ) 600 MG  tablet Take 1 tablet by mouth in the morning, at noon, in the evening, and at bedtime. (Patient taking differently: Take 1 tablet by mouth 2 times daily.) 360 tablet 3    Multiple Vitamins-Minerals (THERAPEUTIC MULTIVITAMIN-MINERALS) tablet Take 1 tablet by mouth daily      vitamin D3 (CHOLECALCIFEROL ) 10 MCG (400 UNIT) TABS tablet Take 2 tablets by mouth daily      CPAP Machine MISC by Does not apply route      colchicine  (MITIGARE ) 0.6 MG capsule Take 1 capsule by mouth as needed for Pain 30 capsule 3    fexofenadine (ALLEGRA) 180 MG tablet Take 1 tablet by mouth daily       No facility-administered encounter medications on file as of 01/26/2024.       ALLERGIES:  Allergies   Allergen Reactions    Hydrocodone-Acetaminophen  Anxiety and Itching    Losartan Shortness Of Breath    Amlodipine Other (See Comments)     Other reaction(s): Other (See Comments)  Fatigue   Fatigue       Rosuvastatin Other (See Comments)     Other reaction(s): Constipation  Fatigue, constipation  Other reaction(s): Fatigue    Atorvastatin Diarrhea    Ezetimibe      Other reaction(s): Constipation, Other (See Comments)  Constipation and decreased urine output  Constipation and decreased urine output  Other reaction(s): Constipation, Urine output low         Physical Exam:   BP (!) 169/80   Pulse 97   Wt 134.7 kg (297 lb)   SpO2 95%   BMI 39.18 kg/m     Physical and Neurological Exam  General: no acute distress,  cooperative  HEENT: normocephalic, anicteric sclerae, MMM  Cardiovascular: RRR  Respiratory: good air entry b/l, unlabored breathing  Gastrointestinal: non-distended   Extremities: no peripheral edema   Skin: warm, dry, no rash    Mental Status: AOx4, normal affect, following commands consistently, language is normal (both comprehension and repetition)  Language: Comprehension intact, no notable hearing deficit, expression is fluent.  Speech:   Cranial Nerves: visual fields full, eye movements are intact  light touch in V1-3  distributions is intact and symmetric  facial muscles of expression symmetric  hearing grossly intact bilaterally  uvula is midline, symmetric palate elevation, SCM and trap strength symmetric  Motor: normal bulk and tone, strength in LUE 5/5, RUE 5/5, dorsiflexion weakness bilaterally 3 out of 5  Sensory: length-dependent decrease in light touch/pinprick and temperature sensation from bilateral toes to knees, vibration sense is reduced in both toes  Reflexes: Absent ankles and knees  Coordination: FTN and HTS without dysmetria, Romberg negative   Gait: Cautious, ambulates with cane    Assessment and Plan:   Martine Trageser is a 68 y.o. male who presents with the following concerns:     Jossue was seen today for follow-up.    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  I do not think his symptoms are secondary to worsening of peripheral polyneuropathy.  Explained to him signs and symptoms of worsening neuropathy and he expressed understanding.  He is currently dissatisfied with current dosing of gabapentin  and would like to reattempt to reduce it.  I advised him to start with morning dose and try to keep afternoon and evening doses.  Also placing referral to podiatry since patient is looking for one.    Return in about 1 year (around 01/25/2025).    Lorane Lavender, MD   General Neurology    [facility]  [address]  [place], [place] [address]  Phone: [phone]    The patient (or guardian, if applicable) and other individuals in attendance with the patient were advised that Artificial Intelligence will be utilized during this visit to record, process the conversation to generate a clinical note, and support improvement of the AI technology. The patient (or guardian, if  applicable) and other individuals in attendance at the appointment consented to the use of AI, including the recording.      All medications and relevant precautions were fully reviewed with the patient. More than 50% of this visit was used to evaluate, educate and coordinate care for the patient for the above concerns. Total visit time: 37 minutes. Time includes pre- and post- face-to-face time, including record review of available pertinent images and reports. I have written all aspects of this note, parts of which were produced using speech recognition software, which may contain inadvertent errors in the produced words.   "

## 2024-02-05 ENCOUNTER — Ambulatory Visit: Admit: 2024-02-05 | Discharge: 2024-02-05 | Payer: MEDICARE | Attending: Medical | Primary: Family Medicine

## 2024-02-05 ENCOUNTER — Encounter

## 2024-02-05 LAB — COMPREHENSIVE METABOLIC PANEL
ALT: 45 U/L (ref 8–55)
AST: 47 U/L — ABNORMAL HIGH (ref 15–37)
Albumin/Globulin Ratio: 1 (ref 1.0–1.9)
Albumin: 3.6 g/dL (ref 3.2–4.6)
Alk Phosphatase: 61 U/L (ref 40–129)
Anion Gap: 9 mmol/L (ref 7–16)
BUN: 30 mg/dL — ABNORMAL HIGH (ref 8–23)
CO2: 25 mmol/L (ref 20–29)
Calcium: 9.1 mg/dL (ref 8.8–10.2)
Chloride: 100 mmol/L (ref 98–107)
Creatinine: 1.18 mg/dL (ref 0.80–1.30)
Est, Glom Filt Rate: 67 ml/min/1.73m2 (ref >60)
Globulin: 3.5 g/dL (ref 2.3–3.5)
Glucose: 119 mg/dL — ABNORMAL HIGH (ref 70–99)
Potassium: 4.5 mmol/L (ref 3.5–5.1)
Sodium: 134 mmol/L — ABNORMAL LOW (ref 136–145)
Total Bilirubin: 1.1 mg/dL (ref 0.0–1.2)
Total Protein: 7.1 g/dL (ref 6.3–8.2)

## 2024-02-05 LAB — BRAIN NATRIURETIC PEPTIDE: NT Pro-BNP: 118 pg/mL (ref 0–125)

## 2024-02-05 MED ORDER — CLOTRIMAZOLE-BETAMETHASONE 1-0.05 % EX CREA
1-0.05 | CUTANEOUS | 1 refills | 26.00000 days | Status: DC
Start: 2024-02-05 — End: 2024-03-20

## 2024-02-05 NOTE — Progress Notes (Signed)
 "  [facility]  [address] [place] [address]  Phone: [phone]  Fax [phone]  Provider : Arnulfo Glance PA-C      Patient: Chad Rosario  Date of Birth: Aug 07, 1955  Patient Age 68 y.o.  Patient sex: male  MEDICAL RECORD NUMBER [id number]  Visit Date:02/05/2024   Author:  Arnulfo L. Glance RIGGERS    Family Practice Clinic Note     Chief complaint  Chad Rosario  is a 68 y.o. male who was seen on 02/05/2024 for the following reasons:    Chief Complaint   Patient presents with    Edema     R calf, with D/C    Other     Balance issues--has to catch himself from falling when standing up       Current Medical problems addressed  Peripheral edema  He takes lasix  prn and takes it about 2 -3 times a week.  He notes about a week ago it started draining consistantly and having to bandage.  No homans sign and no fever or chills.   He has a neuropathy and has balance issues and is doing PT  and done 4 weeks and will give home program.      Also asking for something to use prn for jock itch    ASSESSMENT AND PLAN    ICD-10-CM    1. Peripheral polyneuropathy  G62.9 Comprehensive Metabolic Panel     Brain Natriuretic Peptide     Brain Natriuretic Peptide     Comprehensive Metabolic Panel      2. At high risk for falls  Z91.81       3. Balance disorder  R26.89       4. Jock itch  B35.6 clotrimazole -betamethasone  (LOTRISONE ) 1-0.05 % cream      5. Flu vaccine need  Z23 Influenza, FLUAD Trivalent, (age 77 y+), IM, Preservative Free, 0.5mL      6. Localized edema  R60.0 Brain Natriuretic Peptide     Brain Natriuretic Peptide      7. Elevated LFTs  R79.89 Anti-smooth muscle antibody         Dawid was seen today for edema and other.    Diagnoses and all orders for this visit:    Peripheral polyneuropathy  -     Comprehensive Metabolic Panel; Future  -     Brain Natriuretic Peptide; Future  -     Brain Natriuretic Peptide  -     Comprehensive Metabolic Panel    At high risk for falls    Balance disorder    Jock itch  -      clotrimazole -betamethasone  (LOTRISONE ) 1-0.05 % cream; Apply topically 2 times daily.    Flu vaccine need  -     Influenza, FLUAD Trivalent, (age 81 y+), IM, Preservative Free, 0.5mL    Localized edema  -     Brain Natriuretic Peptide; Future  -     Brain Natriuretic Peptide    Elevated LFTs  -     Anti-smooth muscle antibody            Plan includes the following:  Follow up in 6 months   No follow-up provider specified.  Orders Placed This Encounter   Procedures    Influenza, FLUAD Trivalent, (age 9 y+), IM, Preservative Free, 0.5mL    Comprehensive Metabolic Panel     Standing Status:   Future     Number of Occurrences:  1     Expected Date:   02/05/2024     Expiration Date:   02/04/2025    Brain Natriuretic Peptide     Standing Status:   Future     Number of Occurrences:   1     Expected Date:   02/05/2024     Expiration Date:   02/04/2025       Past Medical History:  Allergies:  Allergies   Allergen Reactions    Hydrocodone-Acetaminophen  Anxiety and Itching    Losartan Shortness Of Breath    Amlodipine Other (See Comments)     Other reaction(s): Other (See Comments)  Fatigue   Fatigue       Rosuvastatin Other (See Comments)     Other reaction(s): Constipation  Fatigue, constipation  Other reaction(s): Fatigue    Atorvastatin Diarrhea    Ezetimibe      Other reaction(s): Constipation, Other (See Comments)  Constipation and decreased urine output  Constipation and decreased urine output  Other reaction(s): Constipation, Urine output low         Current Medications:   Medications marked taking at this time:  Current Outpatient Medications   Medication Sig Dispense Refill    clotrimazole -betamethasone  (LOTRISONE ) 1-0.05 % cream Apply topically 2 times daily. 45 g 1    vitamin B-6 (PYRIDOXINE ) 50 MG tablet Take 1 tablet by mouth daily      allopurinol  (ZYLOPRIM ) 300 MG tablet TAKE 1 TABLET BY MOUTH DAILY 30 tablet 11    zolpidem  (AMBIEN ) 5 MG tablet Take 1 tablet by mouth at bedtime for 90 days. Max Daily  Amount: 5 mg (Patient taking differently: Take 1 tablet by mouth nightly as needed.) 30 tablet 2    Magic Mouthwash (MIRACLE MOUTHWASH) Swish and spit 5 mLs 4 times daily as needed for Irritation 50 mL diphenydramine hcl, 50 mL Maalox, 50 mL lidocaine  HCL 150 mL 0    apixaban  (ELIQUIS ) 5 MG TABS tablet Take 1 tablet by mouth 2 times daily      metoprolol  succinate (TOPROL  XL) 25 MG extended release tablet Take 1 tablet by mouth daily 90 tablet 3    azelastine (ASTEPRO) 137 MCG/SPRAY nasal spray USE 1 TO 2 SPRAYS IN EACH NOSTRIL TWICE DAILY AS NEEDED AS DIRECTED      lisinopril  (PRINIVIL ;ZESTRIL ) 20 MG tablet Take 2 tablets by mouth daily (Patient taking differently: Take 1 tablet by mouth daily) 180 tablet 1    furosemide  (LASIX ) 40 MG tablet Take 1 tablet by mouth daily as needed (take one tablet daily for weight garin greater than 2 lbs in 24 hours or 4 lbs in 48 hours.) 30 tablet 3    Cyanocobalamin (VITAMIN B 12 PO) Take by mouth every morning      gabapentin  (NEURONTIN ) 600 MG tablet Take 1 tablet by mouth in the morning, at noon, in the evening, and at bedtime. (Patient taking differently: Take 1 tablet by mouth 2 times daily.) 360 tablet 3    Multiple Vitamins-Minerals (THERAPEUTIC MULTIVITAMIN-MINERALS) tablet Take 1 tablet by mouth daily      vitamin D3 (CHOLECALCIFEROL ) 10 MCG (400 UNIT) TABS tablet Take 2 tablets by mouth daily      CPAP Machine MISC by Does not apply route      colchicine  (MITIGARE ) 0.6 MG capsule Take 1 capsule by mouth as needed for Pain 30 capsule 3    fexofenadine (ALLEGRA) 180 MG tablet Take 1 tablet by mouth daily       No current  facility-administered medications for this visit.        Current Problem List:   Patient Active Problem List   Diagnosis    Obesity (BMI 30-39.9)    History of DVT (deep vein thrombosis)    Hx of CABG    Hypertension    Hyperlipidemia    DOE (dyspnea on exertion)    Thrombocytopenia    Neuropathic pain    Paresthesia    Bilateral leg weakness    Foot  drop, bilateral    Cramps of lower extremity    Acute medial meniscal tear    Allergic rhinitis    Ataxia    Calcium oxalate renal stones    Chronic pain disorder    Coronary artery disease involving native coronary artery of native heart without angina pectoris    Deep vein thrombosis (DVT) of femoral vein of right lower extremity (HCC)    Degeneration of intervertebral disc of lumbar region    Colon adenoma    Dyslipidemia    GERD without esophagitis    Gout of both feet    Idiopathic chronic gout of multiple sites without tophus    H/O seasonal allergies    Idiopathic gout    Idiopathic peripheral neuropathy    IFG (impaired fasting glucose)    Insomnia    Low back pain    Lumbar facet joint pain    Lumbar postlaminectomy syndrome    Lumbar radiculopathy, chronic    Lumbar stenosis    Obstructive sleep apnea syndrome    Primary osteoarthritis of left hip    S/P angioplasty with stent    Balance disorder    Volume overload    Bilateral lower extremity edema    Atrial fibrillation with RVR (HCC)    Congestive heart failure (HCC)    Persistent atrial fibrillation (HCC)    Chronic heart failure with preserved ejection fraction (HFpEF) (HCC)    Wound dehiscence, surgical    Lower extremity edema    Status post ablation of atrial fibrillation    S/P ablation of atrial flutter    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)    Non-pressure chronic ulcer of lower leg with fat layer exposed, right (HCC)    Peripheral polyneuropathy    Medication management    Morbid (severe) obesity due to excess calories (E66.01)    Body mass index [BMI] 37.0-37.9, adult (S31.62)    Thoracic aortic aneurysm without rupture    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    Thrombocytopenia, unspecified    Nutritional deficiency    Hepatic steatosis    Elevated LFTs    Anticoagulated    Impaired gait    Jock itch       Social History:   Social History     Tobacco Use    Smoking status: Never    Smokeless  tobacco: Never   Substance Use Topics    Alcohol use: Yes     Alcohol/week: 12.0 standard drinks of alcohol     Types: 6 Cans of beer, 6 Shots of liquor per week     Comment: socially       Family History:   Family History   Problem Relation Age of Onset    Cancer Mother         Lymp    Stroke Father     Heart Surgery Father     Diabetes Father     Heart Disease Father  Cancer Sister         Breast    Clotting Disorder Maternal Grandfather        Surgical History:  Past Surgical History:   Procedure Laterality Date    BACK SURGERY  2021    has had 3 different ones last was in 2021    CARDIAC CATHETERIZATION  2016    CARDIAC SURGERY      CABG    CAROTID STENT PLACEMENT  2015    CARPAL TUNNEL RELEASE  Aug 2019 & Dec 2019    CHOLECYSTECTOMY      COLONOSCOPY N/A 11/29/2020    COLONOSCOPY POLYPECTOMY HOT SNARE performed by Randalyn Romans, MD at [facility] ENDOSCOPY    COLOSTOMY  2007    EP DEVICE PROCEDURE N/A 05/22/2023    Ablation A-fib w complete ep study performed by Carlon Reyes PARAS, MD at [facility] CARDIAC CATH LAB    EYE SURGERY Left     cataract    FRACTURE SURGERY Left     tib-fib fracture    JOINT REPLACEMENT  Hip - 2018    KNEE ARTHROSCOPY Right     LUMBAR FUSION  Dec 2021    LUMBAR LAMINECTOMY  March 2003    SPINAL FUSION  2020    TOTAL HIP ARTHROPLASTY Left        ROS  Review of Systems   Constitutional:  Negative for fever.   Eyes:  Negative for visual disturbance.   Respiratory:  Negative for cough and shortness of breath.    Cardiovascular:  Negative for chest pain.   Gastrointestinal:  Negative for blood in stool.   Musculoskeletal:  Negative for myalgias.   Skin:  Negative for rash.   Neurological:  Negative for syncope and weakness.   Psychiatric/Behavioral:  Negative for dysphoric mood.        BP 120/70   Pulse 78   Temp 98 F (36.7 C)   Ht 1.854 m (6' 1)   Wt 132.5 kg (292 lb)   SpO2 95%   BMI 38.52 kg/m   Body mass index is 38.52 kg/m.     Physical Exam    Physical Exam  Vitals and nursing note  reviewed.   Constitutional:       Appearance: Normal appearance.   Eyes:      Conjunctiva/sclera: Conjunctivae normal.   Cardiovascular:      Rate and Rhythm: Normal rate and regular rhythm.   Pulmonary:      Effort: Pulmonary effort is normal.      Breath sounds: Normal breath sounds.   Musculoskeletal:      Cervical back: Neck supple.      Right lower leg: No edema.      Left lower leg: No edema.   Neurological:      Mental Status: He is alert.   Psychiatric:         Mood and Affect: Mood normal.          I have reviewed the patient's past medical history, social history and family history and vitals.  We have discussed treatment plan and follow up and given patient instructions.  Patient's questions are answered and we will follow up as indicated.  No follow-ups on file.     Ily Denno L. Gretel, PA-C  4:23 PM  02/09/2024  "

## 2024-02-05 NOTE — Progress Notes (Signed)
 "  Have you been to the ER, urgent care clinic since your last visit?  Hospitalized since your last visit?"    NO    "Have you seen or consulted any other health care providers outside our system since your last visit?"    NO

## 2024-02-09 LAB — ANTI-SMOOTH MUSCLE ANTIBODY: Smooth Muscle Ab: 10 U (ref 0–19)

## 2024-02-11 ENCOUNTER — Ambulatory Visit: Admit: 2024-02-11 | Discharge: 2024-02-11 | Payer: MEDICARE | Attending: Family Medicine | Primary: Family Medicine

## 2024-02-11 VITALS — BP 120/70 | HR 59 | Temp 98.20000°F | Ht 73.0 in | Wt 293.8 lb

## 2024-02-11 DIAGNOSIS — I83891 Varicose veins of right lower extremities with other complications: Principal | ICD-10-CM

## 2024-02-11 MED ORDER — CEPHALEXIN 500 MG PO CAPS
500 | ORAL_CAPSULE | Freq: Three times a day (TID) | ORAL | 0 refills | Status: AC
Start: 2024-02-11 — End: 2024-02-18

## 2024-02-11 NOTE — Progress Notes (Signed)
"  Have you been to the ER, urgent care clinic since your last visit?  Hospitalized since your last visit?   NO    Have you seen or consulted any other health care providers outside our system since your last visit?   YES - When: approximately 1 months ago.  Where and Why: 10/09 [facility] Sore left nostril, Follow up--11/03 Neurology Follow up--11/13 Acute visit right leg weeping.          "

## 2024-02-12 ENCOUNTER — Ambulatory Visit: Admit: 2024-02-12 | Discharge: 2024-02-17 | Payer: MEDICARE | Primary: Family Medicine

## 2024-02-12 DIAGNOSIS — I83891 Varicose veins of right lower extremities with other complications: Principal | ICD-10-CM

## 2024-02-13 ENCOUNTER — Encounter

## 2024-02-13 ENCOUNTER — Inpatient Hospital Stay: Admit: 2024-02-13 | Discharge: 2024-02-13 | Payer: MEDICARE | Primary: Family Medicine

## 2024-02-13 VITALS — Temp 97.60000°F | Resp 18

## 2024-02-13 DIAGNOSIS — S81801A Unspecified open wound, right lower leg, initial encounter: Principal | ICD-10-CM

## 2024-02-13 MED ORDER — GABAPENTIN 600 MG PO TABS
600 | ORAL_TABLET | Freq: Four times a day (QID) | ORAL | 3 refills | 30.00000 days | Status: AC
Start: 2024-02-13 — End: 2025-02-12

## 2024-02-13 NOTE — Flowsheet Note (Signed)
"     02/13/24 1506   Right Leg Edema Point of Measurement   Compression Therapy 2 layer compression wrap   Wound 02/13/24 Other (Comment) Leg Right #1   Date First Assessed/Time First Assessed: 02/13/24 1506   Wound Approximate Age at First Assessment (Weeks): 2 weeks  Primary Wound Type: Other (Comment)  Location: Leg  Wound Location Orientation: Right  Wound Description (Comments): #1   Wound Image     Wound Type Wound   Wound Etiology Venous   Wound Dressing Status Old drainage noted   Wound Cleansed Vashe   Wound Dressing/Treatment Adhesive bandage   Wound Length (cm) 8.8 cm   Wound Width (cm) 3.8 cm   Wound Depth (cm) 0.1 cm   Wound Surface Area (cm^2) 26.26 cm^2   Wound Volume (cm^3) 1.751 cm^3   Post-Procedure Length (cm) 8.8 cm   Post-Procedure Width (cm) 3.8 cm   Post-Procedure Depth (cm) 0.1 cm   Post-Procedure Surface Area (cm^2) 26.26 cm^2   Post-Procedure Volume (cm^3) 1.751 cm^3   Wound Assessment Slough;Pink/red   Wound Drainage Amount Moderate (25-50%)   Wound Drainage Description Serosanguinous   Wound Odor None   Peri-wound Assessment Edematous   Wound Margins Defined edges   Wound Thickness Description not for Pressure Injury Full thickness       "

## 2024-02-13 NOTE — Wound Care (Signed)
"  Multilayer Compression Wrap   (Not Unna) Below the Knee    NAME:  Chad Rosario  DATE OF BIRTH:  05-11-1955  MEDICAL RECORD NUMBER:  [id number]  DATE:  02/13/2024    Multilayer compression wrap: Removed old Multilayer wrap if indicated and wash leg with mild soap/water.  Applied moisturizing agent to dry skin as needed.   Applied primary and secondary dressing as ordered.  Applied multilayered dressing below the knee to right lower leg.  Instructed patient/caregiver not to remove dressing and to keep it clean and dry.   Instructed patient/caregiver on complications to report to provider, such as pain, numbness in toes, heavy drainage, and slippage of dressing.  Instructed patient on purpose of compression dressing and on activity and exercise recommendations.      Electronically signed by Jarold Lat, RN on 02/13/2024 at 3:42 PM   "

## 2024-02-13 NOTE — Wound Care (Addendum)
"  Discharge Instructions for  [facility]  [address]  [address]  [place], [place] [address]  Phone [phone]   Fax [phone]      NAME:  Chad Rosario  DATE OF BIRTH:  05/01/1955  MEDICAL RECORD NUMBER:  [id number]  DATE:  02/13/2024    Return Appointment:   11 days with Damien Moles, NP  1 week with clinician to change dressing    Instructions: right leg:  Cleanse with normal saline  Allow Vashe Wound Solution moistened gauze to sit on wound bed for 60 seconds  Apply aquacel  Cover with ABD pad  2 layer compression with wound and lower extremity assessment.  If there is any problem with the dressing (too tight, slides down, etc.) Patient to return to wound clinic to have re-wrapped by clinician.  Change weekly in clinic     Increase protein to 100g daily for optimal wound healing. Aim for 30g protein per shake, two shakes a day.   Protein:   Egg ~ 6g  Yogurt ~ 15g  Half cup of cottage cheese ~ 15g  Chicken breast ~ 30g  Salmon filet ~ 40g      Should you experience increased redness, swelling, pain, foul odor, size of wound(s), or have a temperature over 101 degrees please contact the Wound Healing Center at [phone] or if after hours contact your primary care physician or go to the hospital emergency department.    PLEASE NOTE: IF YOU ARE UNABLE TO OBTAIN WOUND SUPPLIES, CONTINUE TO USE THE SUPPLIES YOU HAVE AVAILABLE UNTIL YOU ARE ABLE TO REACH US . IT IS MOST IMPORTANT TO KEEP THE WOUND COVERED AT ALL TIMES.    Electronically signed Jarold Lat, RN on 02/13/2024 at 3:04 PM    "

## 2024-02-14 NOTE — Progress Notes (Signed)
 "  Chad Rosario  12-Nov-1955 is a 68 y.o. male ,Established patient, here for evaluation of the following chief complaint(s):   Chief Complaint   Patient presents with    3 Month Follow-Up     Pt is not fasting-- seen Arnulfo last week for the weeping in right leg she suggested doing fluid pill 3 days in a row and that did help-- he also has a sore on that leg as well and a little bit back that he did go to would center for about 2 months and was doing better off and on but now it is just not healing          1. 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 Edema of right lower leg  -     Vascular duplex lower extremity venous right; Future  3. Skin infection  -     [facility]  -     cephALEXin  (KEFLEX ) 500 MG capsule; Take 1 capsule by mouth 3 times daily for 7 days, Disp-21 capsule, R-0Normal  4. Peripheral polyneuropathy  -     CBC with Auto Differential; Future  -     Comprehensive Metabolic Panel; Future  -     Vitamin B12; Future  5. Atrial fibrillation with RVR (HCC)  -     CBC with Auto Differential; Future  -     Comprehensive Metabolic Panel; Future  -     TSH; Future  6. Other hyperlipidemia  -     Lipid Panel; Future  7. Gout, unspecified cause, unspecified chronicity, unspecified site  -     Uric Acid; Future  8. Prediabetes  -     CBC with Auto Differential; Future  -     Comprehensive Metabolic Panel; Future  -     Hemoglobin A1C; Future  9. Vitamin D deficiency  -     Vitamin D 25 Hydroxy; Future        Return in about 3 months (around 05/13/2024).        Subjective   SUBJECTIVE/OBJECTIVE:  Leg Pain   The incident occurred more than 1 week ago. There was no injury mechanism. The pain is present in the right leg. The quality of the pain is described as cramping. The pain is at a severity of 2/10. The pain is mild (HE STATES THAT THE LEG JUST SEEMS TO FEEL TIGHTER THAN IT HAS BEFORE.).    Hypertension  This is a chronic problem. The current episode started more than 1 year ago. The problem has been gradually improving since onset. The problem is controlled. Associated symptoms include peripheral edema. Pertinent negatives include no anxiety, blurred vision, chest pain, headaches, malaise/fatigue, palpitations or shortness of breath.   Dizziness  Quality:  Lightheadedness and imbalance  Severity:  Moderate  Timing:  Constant  Progression:  Unchanged  Chronicity:  Chronic  Context comment:  He has SEVERE PERIPHERAL NEUROPATHY  Worsened by:  Nothing  Associated symptoms: no blood in stool, no chest pain, no headaches, no hearing loss, no nausea, no palpitations, no shortness of breath and no weakness    Skin Problem  This is a chronic problem. The current episode started more than 1 year ago. The problem is unchanged. Pertinent negatives include no cough, eye pain, fever, shortness of breath or sore throat.  Review of Systems   Constitutional:  Negative for chills, fever and malaise/fatigue.   HENT:  Negative for ear pain, hearing loss and sore throat.    Eyes:  Negative for blurred vision, photophobia and pain.   Respiratory:  Negative for cough and shortness of breath.    Cardiovascular:  Negative for chest pain, palpitations and leg swelling.   Gastrointestinal:  Negative for abdominal distention, abdominal pain, blood in stool and nausea.   Genitourinary:  Negative for dysuria and urgency.   Musculoskeletal:  Negative for joint swelling and myalgias.   Skin:  Negative for color change, pallor and rash.   Neurological:  Positive for dizziness. Negative for speech difficulty, weakness, light-headedness and headaches.   Hematological:  Negative for adenopathy.   Psychiatric/Behavioral:  Negative for agitation, confusion, hallucinations, self-injury and suicidal ideas.           Objective   Physical Exam  Constitutional:       Appearance: Normal appearance.   HENT:      Head: Normocephalic and  atraumatic.      Nose: Nose normal.   Eyes:      Extraocular Movements: Extraocular movements intact.      Conjunctiva/sclera: Conjunctivae normal.      Pupils: Pupils are equal, round, and reactive to light.   Cardiovascular:      Rate and Rhythm: Normal rate and regular rhythm.      Pulses: Normal pulses.      Heart sounds: Normal heart sounds.   Pulmonary:      Effort: Pulmonary effort is normal.      Breath sounds: Normal breath sounds.   Abdominal:      General: Abdomen is flat. Bowel sounds are normal.      Palpations: Abdomen is soft.   Skin:     General: Skin is warm and dry.   Neurological:      General: No focal deficit present.      Mental Status: He is alert and oriented to person, place, and time.   Psychiatric:         Mood and Affect: Mood normal.                 An electronic signature was used to authenticate this note.    --Ozell Cyndee Mare, MD   "

## 2024-02-17 ENCOUNTER — Ambulatory Visit
Admit: 2024-02-17 | Discharge: 2024-02-17 | Payer: MEDICARE | Attending: Cardiovascular Disease | Primary: Family Medicine

## 2024-02-17 MED ORDER — METOPROLOL SUCCINATE ER 50 MG PO TB24
50 | ORAL_TABLET | Freq: Every day | ORAL | 3 refills | 90.00000 days | Status: DC
Start: 2024-02-17 — End: 2024-03-01

## 2024-02-17 NOTE — Progress Notes (Signed)
 "[facility] Follow Up                 Reason for Visit: History of atrial fibrillation    Subjective:     Patient is a 68 y.o. male with a PMH of chronic HFpEF, atrial fibrillation status post ablation, TAA, CAD status post CABG in 2015, statin intolerance, hypertension, hyperlipidemia, thrombocytopenia, chronic venous insufficiency and DVT who presents for follow-up.  The patient was last seen in May 2025.  He was referred to hematology.  A thoracic CTA was ordered.  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  His ascending aorta was noted to be 4.3 cm on CTA in June 2025.  The patient had a TTE in May 2025 that was noted to demonstrate a normal EF.  He continues to follow with EP.  The patient denies angina.  He reports DOE with just walking as well as peripheral edema.  He has been nonadherent to lisinopril  for the last 2 weeks.    Past Medical History:   Diagnosis Date    CAD (coronary artery disease) 2015    Cataract 2017    Cervical disc disorder     Colon polyp 2007    Concussion 1968    Gout 03/2015    Hypertension     controlled with meds    Low back pain Jan 2002    Lumbosacral disc disease March 2003    Neuromuscular disorder [facility]) 2019    Neuropathy May 2019    Osteoarthritis 03/2013    Plantar fasciitis 1995    Skin cancer 2016    Sleep apnea     cpap at night    Spinal stenosis 03/2001    Tendinitis 2015      Past Surgical History:   Procedure Laterality Date    BACK SURGERY  2021    has had 3 different ones last was in 2021    CARDIAC CATHETERIZATION  2016    CARDIAC SURGERY      CABG    CAROTID STENT PLACEMENT  2015    CARPAL TUNNEL RELEASE  Aug 2019 & Dec 2019    CHOLECYSTECTOMY      COLONOSCOPY N/A 11/29/2020    COLONOSCOPY POLYPECTOMY HOT SNARE performed by Randalyn Romans, MD at [facility] ENDOSCOPY    COLOSTOMY  2007    EP DEVICE  PROCEDURE N/A 05/22/2023    Ablation A-fib w complete ep study performed by Carlon Reyes PARAS, MD at [facility] CARDIAC CATH LAB    EYE SURGERY Left     cataract    FRACTURE SURGERY Left     tib-fib fracture    JOINT REPLACEMENT  Hip - 2018    KNEE ARTHROSCOPY Right     LUMBAR FUSION  Dec 2021    LUMBAR LAMINECTOMY  March 2003    SHOULDER SURGERY      SPINAL FUSION  2020    TOTAL HIP ARTHROPLASTY Left     VASECTOMY        Family History   Problem Relation Age of Onset    Cancer Mother         Lymp    Stroke Father     Heart Surgery Father     Diabetes Father     Heart Disease Father     Cancer Sister         Breast  Clotting Disorder Maternal Grandfather       Social History     Tobacco Use    Smoking status: Never    Smokeless tobacco: Never   Substance Use Topics    Alcohol use: Yes     Alcohol/week: 12.0 standard drinks of alcohol     Types: 6 Cans of beer, 6 Shots of liquor per week     Comment: socially      Allergies   Allergen Reactions    Hydrocodone-Acetaminophen  Anxiety and Itching    Losartan Shortness Of Breath    Amlodipine Other (See Comments)     Other reaction(s): Other (See Comments)  Fatigue   Fatigue       Rosuvastatin Other (See Comments)     Other reaction(s): Constipation  Fatigue, constipation  Other reaction(s): Fatigue    Atorvastatin Diarrhea    Ezetimibe      Other reaction(s): Constipation, Other (See Comments)  Constipation and decreased urine output  Constipation and decreased urine output  Other reaction(s): Constipation, Urine output low           ROS:  No obvious pertinent positives on review of systems except for what was outlined above.       Objective:       BP (!) 140/80   Pulse 80   Ht 1.854 m (6' 1)   Wt 135.4 kg (298 lb 6.4 oz)   BMI 39.37 kg/m     BP Readings from Last 3 Encounters:   02/17/24 (!) 140/80   02/11/24 120/70   02/05/24 120/70       Wt Readings from Last 3 Encounters:   02/17/24 135.4 kg (298 lb 6.4 oz)   02/11/24 133.3 kg (293 lb 12.8 oz)   02/05/24 132.5 kg  (292 lb)       General/Constitutional:   Alert and oriented x 3, no acute distress  HEENT:   normocephalic, atraumatic, moist mucous membranes  Neck:   No JVD or carotid bruits bilaterally  Cardiovascular:   IRIR, no rub/gallop appreciated  Pulmonary:   clear to auscultation bilaterally, no respiratory distress  Abdomen:   soft, non-tender, non-distended  Ext:   No sig LE edema bilaterally  Skin:  warm and dry, no obvious rashes seen  Neuro:   no obvious sensory or motor deficits  Psychiatric:   normal mood and affect    ECG:   Sinus rhythm  PACs  PVCs  Heart rate 81 bpm    Data Review:   Lab Results   Component Value Date    CHOL 151 12/29/2023    CHOL 170 08/08/2023    CHOL 161 06/10/2023     Lab Results   Component Value Date    TRIG 138 12/29/2023    TRIG 111 08/08/2023    TRIG 154 (H) 06/10/2023     Lab Results   Component Value Date    HDL 41 12/29/2023    HDL 53 08/08/2023    HDL 46 03/11/2023     No components found for: LDLCHOLESTEROL, LDLCALC  Lab Results   Component Value Date    VLDL 28 (H) 12/29/2023    VLDL 22 08/08/2023    VLDL 16 03/11/2023     Lab Results   Component Value Date    CHOLHDLRATIO 3.7 12/29/2023    CHOLHDLRATIO 3.2 08/08/2023    CHOLHDLRATIO 3.1 03/11/2023        Lab Results   Component Value Date/Time    NA 134  02/05/2024 11:37 AM    NA 135 09/29/2023 11:51 AM    NA 136 08/08/2023 10:21 AM    K 4.5 02/05/2024 11:37 AM    K 4.5 09/29/2023 11:51 AM    K 4.8 08/08/2023 10:21 AM    CL 100 02/05/2024 11:37 AM    CL 101 09/29/2023 11:51 AM    CL 102 08/08/2023 10:21 AM    CO2 25 02/05/2024 11:37 AM    CO2 21 09/29/2023 11:51 AM    CO2 26 08/08/2023 10:21 AM    BUN 30 02/05/2024 11:37 AM    BUN 17 09/29/2023 11:51 AM    BUN 19 08/08/2023 10:21 AM    CREATININE 1.18 02/05/2024 11:37 AM    CREATININE 1.16 09/29/2023 11:51 AM    CREATININE 1.08 08/08/2023 10:21 AM    GLUCOSE 119 02/05/2024 11:37 AM    GLUCOSE 112 09/29/2023 11:51 AM    GLUCOSE 109 08/08/2023 10:21 AM    GLUCOSE 109  06/10/2023 09:04 AM    GLUCOSE 127 11/21/2021 09:42 AM    CALCIUM 9.1 02/05/2024 11:37 AM    CALCIUM 9.4 09/29/2023 11:51 AM    CALCIUM 9.2 08/08/2023 10:21 AM         Lab Results   Component Value Date    ALT 45 02/05/2024    ALT 42 09/29/2023    ALT 52 08/08/2023    AST 47 (H) 02/05/2024    AST 46 (H) 09/29/2023    AST 45 (H) 08/08/2023        Assessment/Plan:   1. History of cardiac ablation for atrial fibrillation (HCC)  2. PVC's (premature ventricular contractions)  3. Premature atrial contractions  - CHA2DS2-VASc equals 4   - Hematology note reviewed in the setting of thrombocytopenia (see thrombocytopenia below): Continue lifelong anticoagulation with Eliquis  as tolerated  - Increase Toprol -XL to 50 mg daily with a concurrent indication of hypertension  - Continue to follow with EP     4. History of DVT (deep vein thrombosis)  - Hematology note reviewed in the setting of thrombocytopenia (see thrombocytopenia below): Continue lifelong anticoagulation with Eliquis  as tolerated    5. Thoracic aortic aneurysm without rupture, unspecified part  - 4.3 cm TAA noted on CTA in June 2025  - Obtain a surveillance thoracic CTA next year    6. Hx of CABG  - In patients with AF and CCD (beyond 1 year after revascularization or CAD not requiring coronary revascularization) without history of stent thrombosis, oral anticoagulation monotherapy is recommended over the combination therapy of OAC and single APT (aspirin or P2Y12 inhibitor) to decrease the risk of major bleeding (class I recommendation; 2023 ACC guidelines for AF)  - History of statin intolerance   - Continue Toprol -XL    7. Hyperlipidemia, unspecified hyperlipidemia type  - History of statin intolerance     8. Hypertension, unspecified type  - Increase Toprol -XL with a concurrent indication of PVCs and PACs    9. 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 Chronic heart failure with preserved ejection fraction (HFpEF) (HCC)  - H2FPEF score at least 6 diagnostic of HFpEF (heavy, atrial fibrillation, elder)  - Resume PO Lasix  as needed  - Instructed the patient to take a dose of Lasix  for weight gain of 2 lbs in two  days or 5 lbs in one week and return to as needed dosing once the weight returns to baseline    F/U: 6 months    Arthea CHRISTELLA Lingo, MD  "

## 2024-02-20 ENCOUNTER — Encounter: Payer: MEDICARE | Primary: Family Medicine

## 2024-02-24 ENCOUNTER — Encounter: Payer: MEDICARE | Primary: Family Medicine

## 2024-02-25 ENCOUNTER — Encounter

## 2024-03-01 ENCOUNTER — Ambulatory Visit: Admit: 2024-03-01 | Discharge: 2024-03-01 | Payer: MEDICARE | Primary: Family Medicine

## 2024-03-01 ENCOUNTER — Ambulatory Visit
Admit: 2024-03-01 | Discharge: 2024-03-01 | Payer: MEDICARE | Attending: Cardiovascular Disease | Primary: Family Medicine

## 2024-03-01 VITALS — BP 150/82 | HR 73 | Ht 73.0 in | Wt 302.0 lb

## 2024-03-01 DIAGNOSIS — I480 Paroxysmal atrial fibrillation: Principal | ICD-10-CM

## 2024-03-01 DIAGNOSIS — I4819 Other persistent atrial fibrillation: Principal | ICD-10-CM

## 2024-03-01 MED ORDER — CARVEDILOL 12.5 MG PO TABS
12.5 | ORAL_TABLET | Freq: Two times a day (BID) | ORAL | 3 refills | 30.00000 days | Status: DC
Start: 2024-03-01 — End: 2024-03-15

## 2024-03-01 NOTE — Telephone Encounter (Signed)
"          Cardiac Clearance        Physician or Practice Requesting: [facility]   Contact Person:   Contact Phone Number: [phone]  Fax Number: [phone]  Date of Surgery/Procedure: TBD  Type of Surgery or Procedure: Myelogram  Type of Anesthesia:   Type of Clearance Requested: risk assessment and any medication hold   Medication to Hold:Eliquis    Days to Hold: 48 hour hold   "

## 2024-03-01 NOTE — Progress Notes (Addendum)
 "      [facility]  [address], [address]  [place], [place] [address]  PHONE: [phone]        03/01/24      NAME:  Chad Rosario  DOB: 11/16/1955  MRN: [id number]     Referring Cardiologist: Lonni HILARIO Sharps, MD and Arthea Lingo, MD     Reason for Consultation: Atrial fibrillation      ASSESSMENT and PLAN:  Atrial fibrillation, mildly persistent, CHADS2VASc = 4 s/p AF ablation (PFA) on 04/2023  HFpEF  CAD s/p CABG  Thrombocytopenia  HTN  HLD  DVT     68 year old male with a history of mildly persistent AF admitted overnight with recurrent severely symptomatic AF with RVR and he is s/p AF ablation.      AF, persistent   -Continue BB, previously reduced dose to 25 mg po nightly.   -s/p AF ablation. Remains in NSR.  -Fatigued but improving, check monitor today to assess chronotropy and for possible arrhythmia.      Stroke ppx  -Continue Eliquis . Previously low platelets. Consider Watchman if bleeding/anemia worsens.     HTN  -His ACEi was recently stopped likely from leg swelling.  -BB was increased but he's done poorly with elevated dose of metoprolol  in the past and metoprolol  is not a good BP medication. Will change to coreg  which is better due to alpha and beta blockade.      HFpEF  -Continue GDMT as tolerated.  -MRA should be considered esp if BP remains elevated as evidence is good with HFpEF.   -Other considerations include SGTL2i and GLP1a.      DVT  -Long term use of OAC? Continue Xarelto .    -Upcoming appt with Dr. Nalani to further discuss options, need for long term therapy.      Thrombocytopenia  -currently undergoing evaluation with hematology. Has been >50K and stable. Discussed with pt.   -Likely ITP.   -Sees Dr. Nalani.     Dilated aorta  -Reviewed imaging, per Dr. Lingo.     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     Routine cardiac care per Dr. Lingo.     EP follow up in 6 months or PRN.     Patient has been instructed and agrees to call our  office with any issues or other concerns related to their cardiac condition(s) and/or complaint(s).    No follow-up provider specified.    Thank you for allowing me to participate in the electrophysiologic care of Chad Rosario [facility]. Please contact me if any questions or concerns were to arise.    Reyes PARAS. Kamira Mellette MD, MS  Clinical Cardiac Electrophysiology  [facility]  03/01/24  9:14 AM    ===================================================================  Chief Complant:    Chief Complaint   Patient presents with    6 Month Follow-Up    Atrial Fibrillation        History:  Chad Rosario is a most pleasant 68 y.o. male with a past medical and cardiac history significant for chronic HFpEF, persistent atrial fibrillation, TAA, CAD status post CABG in 2015, statin intolerance, hypertension, hyperlipidemia, secondary erythrocytosis, chronic venous insufficiency and DVT who presented to ED with irregular heart beat. He is followed by Dr. Lingo. He presented to clinic at that time with DOE with an ECG consistent with AF with RVR in January. Similar presentation this time around. AF was newly diagnosed in January. He was directly admitted  to the hospital. He underwent IV diuresis with documented -5 L. He had a TTE in January 2025 that noted a normal EF and mild MR. His ascending aorta was noted to be 4 cm. He was noted to have a moderately dilated LA. 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 He does have issues with a chronically low platelet count to which he sees hematology due to chronic liver disease?    He comes in for follow up. He remains in NSR after AF ablation, few palps. Mild fatigue. BP severely elevated today. ACEi stopped last week by Dr. Lajoyce. Metoprolol  was increased to 50 mg as well, previously reduced to 25 mg po daily due to fatigue.      Cardiac PMH: (Old records have been reviewed and summarized  below)    AF ablation: 05/23/2023      EKG:  (EKG has been independently visualized by me with interpretation below): NSR, PACs, normal axis, no ischemia.     ECHO: 07/2023  Left Ventricle Normal left ventricular systolic function with a visually estimated EF of 55 - 60%. The LV is poorly visualized in the PLAX view such that LV wall thickness and LV size cannot be accurately determined on the study.  Indeterminate diastolic function due to MAC.   Left Atrium Left atrium is mildly dilated.   Right Ventricle Right ventricle size is normal. Normal systolic function.   Right Atrium Right atrium is dilated.   Aortic Valve Not well visualized. Mild sclerosis of the aortic valve cusps. No regurgitation. No stenosis.   Mitral Valve There is moderate annular calcification noted. Trace regurgitation. No stenosis noted.   Tricuspid Valve Valve structure is normal. Trace regurgitation. No stenosis noted.   Pulmonic Valve The pulmonic valve was not well visualized. No regurgitation. No stenosis noted.   Aorta Not well visualized.   IVC/Hepatic Veins IVC diameter is normal or and decreases greater than 50% during inspiration; therefore the estimated right atrial pressure is normal (~3 mmHg).   Pericardium No pericardial effusion.       ECHO: 03/2023  Left Ventricle Normal left ventricular systolic function with a visually estimated EF of 55 - 60%. Left ventricle size is normal. Normal wall thickness. Normal wall motion. Indeterminate diastolic function due to atrial fibrillation.   Left Atrium Left atrium is moderately dilated. LA Vol Index is  39-45 ml/m2.   Right Ventricle Right ventricle size is normal. Normal systolic function. TAPSE is 1.9 cm.   Right Atrium Right atrium is mildly dilated.   Aortic Valve Moderately thickened cusps. Mildly calcified cusps. Moderate sclerosis of the aortic valve cusps. No regurgitation. No stenosis.   Mitral Valve Moderate annular calcification. Mildly thickened, at the anterior leaflet. Mild  regurgitation. No stenosis noted.   Tricuspid Valve Valve structure is normal. Trace regurgitation. No stenosis noted. Unable to assess RVSP due to inadequate or insignificant tricuspid regurgitation.   Pulmonic Valve Valve structure is normal. Physiologically normal regurgitation. No stenosis noted.   Aorta Normal sized aortic root. Ao root diameter is 3.2 cm. Mildly dilated ascending aorta. Ao ascending diameter is 4.0 cm.   IVC/Hepatic Veins IVC diameter is less than or equal to 21 mm and decreases greater than 50% during inspiration; therefore the estimated right atrial pressure is normal (~3 mmHg). IVC size is normal.   Pericardium No pericardial effusion.      Previous Heart Catheterization: n/a     Stress Test:  n/a     DEVICE INTERROGATION: n/a     Past Medical History, Past Surgical History, Family history, Social History, and Medications were all reviewed with the patient today and updated as necessary.     Current Outpatient Medications   Medication Sig Dispense Refill    metoprolol  succinate (TOPROL  XL) 50 MG extended release tablet Take 1 tablet by mouth daily 90 tablet 3    gabapentin  (NEURONTIN ) 600 MG tablet Take 1 tablet by mouth in the morning, at noon, in the evening, and at bedtime. 360 tablet 3    clotrimazole -betamethasone  (LOTRISONE ) 1-0.05 % cream Apply topically 2 times daily. 45 g 1    vitamin B-6 (PYRIDOXINE ) 50 MG tablet Take 1 tablet by mouth daily      allopurinol  (ZYLOPRIM ) 300 MG tablet TAKE 1 TABLET BY MOUTH DAILY 30 tablet 11    apixaban  (ELIQUIS ) 5 MG TABS tablet Take 1 tablet by mouth 2 times daily      azelastine (ASTEPRO) 137 MCG/SPRAY nasal spray USE 1 TO 2 SPRAYS IN EACH NOSTRIL TWICE DAILY AS NEEDED AS DIRECTED      furosemide  (LASIX ) 40 MG tablet Take 1 tablet by mouth daily as needed (take one tablet daily for weight garin greater than 2 lbs in 24 hours or 4 lbs in 48 hours.) 30 tablet 3    Cyanocobalamin (VITAMIN B 12 PO) Take by mouth every morning      Multiple  Vitamins-Minerals (THERAPEUTIC MULTIVITAMIN-MINERALS) tablet Take 1 tablet by mouth daily      vitamin D3 (CHOLECALCIFEROL ) 10 MCG (400 UNIT) TABS tablet Take 2 tablets by mouth daily      CPAP Machine MISC by Does not apply route      colchicine  (MITIGARE ) 0.6 MG capsule Take 1 capsule by mouth as needed for Pain 30 capsule 3    fexofenadine (ALLEGRA) 180 MG tablet Take 1 tablet by mouth daily       No current facility-administered medications for this visit.     Allergies   Allergen Reactions    Hydrocodone-Acetaminophen  Anxiety and Itching    Losartan Shortness Of Breath    Amlodipine Other (See Comments)     Other reaction(s): Other (See Comments)  Fatigue   Fatigue       Rosuvastatin Other (See Comments)     Other reaction(s): Constipation  Fatigue, constipation  Other reaction(s): Fatigue    Atorvastatin Diarrhea    Ezetimibe      Other reaction(s): Constipation, Other (See Comments)  Constipation and decreased urine output  Constipation and decreased urine output  Other reaction(s): Constipation, Urine output low         Past Medical History:   Diagnosis Date    CAD (coronary artery disease) 2015    Cataract 2017    Cervical disc disorder     Colon polyp 2007    Concussion 1968    Gout 03/2015    Hypertension     controlled with meds    Low back pain Jan 2002    Lumbosacral disc disease March 2003    Neuromuscular disorder [facility]) 2019    Neuropathy May 2019    Osteoarthritis 03/2013    Plantar fasciitis 1995    Skin cancer 2016    Sleep apnea     cpap at night    Spinal stenosis 03/2001    Tendinitis 2015     Past Surgical History:   Procedure Laterality Date    BACK SURGERY  2021  has had 3 different ones last was in 2021    CARDIAC CATHETERIZATION  2016    CARDIAC SURGERY      CABG    CAROTID STENT PLACEMENT  2015    CARPAL TUNNEL RELEASE  Aug 2019 & Dec 2019    CHOLECYSTECTOMY      COLONOSCOPY N/A 11/29/2020    COLONOSCOPY POLYPECTOMY HOT SNARE performed by Randalyn Romans, MD at [facility] ENDOSCOPY    COLOSTOMY  2007     EP DEVICE PROCEDURE N/A 05/22/2023    Ablation A-fib w complete ep study performed by Carlon Reyes PARAS, MD at [facility] CARDIAC CATH LAB    EYE SURGERY Left     cataract    FRACTURE SURGERY Left     tib-fib fracture    JOINT REPLACEMENT  Hip - 2018    KNEE ARTHROSCOPY Right     LUMBAR FUSION  Dec 2021    LUMBAR LAMINECTOMY  March 2003    SHOULDER SURGERY      SPINAL FUSION  2020    TOTAL HIP ARTHROPLASTY Left     VASECTOMY       Family History   Problem Relation Age of Onset    Cancer Mother         Lymp    Stroke Father     Heart Surgery Father     Diabetes Father     Heart Disease Father     Cancer Sister         Breast    Clotting Disorder Maternal Grandfather      Social History     Tobacco Use    Smoking status: Never    Smokeless tobacco: Never   Substance Use Topics    Alcohol use: Yes     Alcohol/week: 12.0 standard drinks of alcohol     Types: 6 Cans of beer, 6 Shots of liquor per week     Comment: socially       ROS:  A comprehensive review of systems was performed with the pertinent positives and negatives as noted in the HPI in addition to:  Review of Systems   Constitutional: Negative.    HENT: Negative.     Eyes: Negative.    Respiratory: Negative.     Cardiovascular: Negative.    Gastrointestinal: Negative.    Endocrine: Negative.    Genitourinary: Negative.    Musculoskeletal: Negative.    Skin: Negative.    Allergic/Immunologic: Negative.    Neurological: Negative.    Hematological: Negative.    Psychiatric/Behavioral: Negative.     All other systems reviewed and are negative.        PHYSICAL EXAM:   BP (!) 182/90   Ht 1.854 m (6' 1)   Wt (!) 137 kg (302 lb)   BMI 39.84 kg/m      Wt Readings from Last 3 Encounters:   03/01/24 (!) 137 kg (302 lb)   02/17/24 135.4 kg (298 lb 6.4 oz)   02/11/24 133.3 kg (293 lb 12.8 oz)     BP Readings from Last 3 Encounters:   03/01/24 (!) 182/90   02/17/24 (!) 140/80   02/11/24 120/70     Gen: Well appearing, well developed, no acute distress  Eyes: Pupils  equal, round. Extraocular movements are intact  ENT: Oropharynx clear, no oral lesions, normal dentition  CV: S1S2, regular rate and rhythm, no murmurs, rubs or gallops, normal JVD, no carotid bruits, normal distal pulses, no LEE  Pulm: Clear to  auscultation bilaterally, no accessory muscle uses, no wheezes or rales  GI: Soft, NT, ND, +BS  Neuro: Alert and oriented, nonfocal  Psych: Appropriate affect  Skin: Normal color and skin turgor  MSK: Normal muscle bulk and tone    Medical problems and test results were reviewed with the patient today.     No results found for any visits on 03/01/24.  "

## 2024-03-01 NOTE — Telephone Encounter (Signed)
"  The patient has a mild to moderate CV risk for a moderate risk procedure. Hold the blood thinner for 4-5 days and restart when able.     JJS  "

## 2024-03-05 ENCOUNTER — Other Ambulatory Visit: Payer: MEDICARE | Primary: Family Medicine

## 2024-03-05 ENCOUNTER — Encounter: Payer: MEDICARE | Attending: Hematology & Oncology | Primary: Family Medicine

## 2024-03-05 NOTE — Progress Notes (Deleted)
 "[facility]: Established patient - follow up     No chief complaint on file.    History of Present Illness:  Chad Rosario is a 68 y.o. male who presents today for FU.  The past medical history is significant for CAD, s/p CABG x2 in 2015 on Eliquis , HLD, secondary polycythemia, DVT, fatty liver, idiopathic neuropathy, colon polyps (last colonoscopy 11/2020, path returned as mixed tubulovillous adenoma), sleep apnea, significant degenerative disc disease in his back status post multiple surgeries to address.  06/07/2021 he underwent PTV/PopV recanalization with PA (Dr. Rox).   - 09/29/23 - presented for evaluation of thrombocytopenia on AC for hx of DVT, currently on Eliquis .  He reports a history of deep vein thrombosis (DVT) approximately 10 years ago, which was associated with pain, warmth, and redness. 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 He also has a history of heart disease and has undergone double bypass surgery. He is currently on Eliquis  and is eager to discontinue it.  He has been experiencing balance issues due to neuropathy for the past 3 years, which worsened over the last year. He does not experience any pain. The cause of his neuropathy is unknown. He consulted a neurologist and was dx'ed with idiopathic neuropathy. He has undergone conduction studies. He retains sensation and can feel pressure, but his fine motor skills are impaired. He has been taking a nerve supplement, which improved his foot movement. He also has foot drop. He had a leg fracture and compartment syndrome, which led to his current condition.  He was informed that he may have fatty liver. This was first mentioned to him in 2007 when he was hospitalized for leg fracture. He underwent a FibroScan in 12/2022, which revealed mild liver fibrosis. He has been under the care of Dr. Roblin, a gastroenterologist. His bilirubin  levels have been elevated since 1992. He had an ultrasound of his liver and spleen, which showed slight enlargement. He does not have a gallbladder.  On evaluation today, he denies lower extremity pain or new swelling.  No chest pain, worsening shortness of breath, bleeding or dark stools, abdominal pain, NVD.  No B symptoms.  No falls, head trauma.  He has chronic leg weakness and due to neuropathy, needs to focus to walk/prevent tripping.      SOCIAL HISTORY  The patient drinks alcohol, typically three or four drinks on Fridays and Saturdays, and occasionally on Sundays.    The patient (or guardian, if applicable) and other individuals in attendance with the patient were advised that Artificial Intelligence will be utilized during this visit to record, process the conversation to generate a clinical note, and support improvement of the AI technology. The patient (or guardian, if applicable) and other individuals in attendance at the appointment consented to the use of AI, including the recording.    10/31/23  History of Present Illness  The patient is here for a follow-up. He is currently on anticoagulation therapy with Eliquis  due to a history of deep vein thrombosis (DVT).  He has a history of secondary erythrocytosis and underwent a bone marrow biopsy in 2017. A comprehensive lab workup was conducted. His MPN mutation panel did not reveal JAK2, CALR, or MPL mutations. His hypercoagulable workup showed normal homocysteine, factor 8 activity, antithrombin activity, protein C and protein S activity and antigen, respectively. Coagulation tests were normal. The DRVVT screen was increased, but the confirmatory test  was normal. Cardiolipin levels were normal, and he did not have a prothrombin mutation. An ultrasound of the spleen and liver was also performed.  He has idiopathic neuropathy, for which conduction studies were completed.  We discussed his labs and next steps in management.      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 OVERALL, THE   FEATURES ARE MOST IN KEEPING WITH SECONDARY ERYTHROCYTOSIS.   11/29/2014.      DVT RLE 7/17  There was not enough blood to do the complete antiphospholipid testing. The lupus anticoagulant was positive which may be artifactual related to the presence of xarelto . Both factor V Leiden and the prothrombin gene mutation were absent     05/28/22: six month follow up. okay overall. recent left knee surgery. 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 seeing vascular soon.  denies chest pain, dizziness, nausea, vomiting, fever, black or bloody stools, spontaneous bleeding.  tolerating Eliquis  5 mg BID but not affordable and switching back to Xarelto .  Hgb remains mildly elevated but stable. Platetlets 64,000 which is low but stable from September/November results. to hold [facility] if/when plts <50K.  Most likely cause of thrombocytopenia is liver disease - encouraged weight loss and avoidance of liver toxic substances like Tylenol /alcohol.   11/28/22 - switched from Eliquis  to Xarelto  and has been taking the medication compliantly at 20 mg p.o. daily.  In last few weeks, noted increased bruising but denies spontaneous bleeding.  denies any pain in legs.  evaluated by Dr. Rox in March.   Venous ultrasound RLE noted occluded femoral vein, it was open a year ago.  Due to chronically occluded right popliteal vein, access to the occluded femoral vein would be challenging.  Any intervention has therefore been deferred until a point when he develops worsening symptoms (swelling, pain, discoloration).  His platelet count has been trending down slowly and is 58,000.  Due to increased bruising, reduced dose of Xarelto  to 10 mg daily.  dose may need to be increased back to 20 mg daily, should he develop any acute thrombotic event.  Likely etiology for thrombocytopenia is liver disease/splenomegaly.  Patient reports a history of binge drinking until his 67s.  Cautioned  against alcohol consumption.  Advised avoidance of acetaminophen .  Rec ultrasound liver/spleen for further evaluation and likely refer him back to GI.    01/30/23 - underwent right shoulder revision arthroplasty and developed joint space infection. He received i.v. antibiotics and was on long term oral abx.    03/2023 FU with Dr Hildegard     09/29/23 first time seeing pt for evaluation   10/31/23 VV FU - discussed results of hypercoag workup and next steps     Family History   Problem Relation Age of Onset    Cancer Mother  Lymp    Stroke Father     Heart Surgery Father     Diabetes Father     Heart Disease Father     Cancer Sister         Breast    Clotting Disorder Maternal Grandfather       Social History     Socioeconomic History    Marital status: Married   Tobacco Use    Smoking status: Never    Smokeless tobacco: Never   Vaping Use    Vaping status: Never Used   Substance and Sexual Activity    Alcohol use: Yes     Alcohol/week: 12.0 standard drinks of alcohol     Types: 6 Cans of beer, 6 Shots of liquor per week     Comment: socially    Drug use: Never    Sexual activity: Not Currently     Partners: Female     Social Drivers of Health     Financial Resource Strain: Low Risk  (04/09/2022)    Received from [facility]    Overall Financial  Resource Strain (CARDIA)     Difficulty of Paying Living Expenses: Not hard at all   Food Insecurity: No Food Insecurity (02/11/2024)    Hunger Vital Sign     Worried About Running Out of Food in the Last Year: Never true     Ran Out of Food in the Last Year: Never true   Transportation Needs: No Transportation Needs (02/11/2024)    PRAPARE - Therapist, Art (Medical): No     Lack of Transportation (Non-Medical): No   Physical Activity: Insufficiently Active (08/05/2023)    Exercise Vital Sign     Days of Exercise per Week: 3 days     Minutes of Exercise per Session: 30 min   Stress: No Stress Concern Present (04/09/2022)    Received from [facility] - Occupational Stress Questionnaire     Feeling of Stress : Not at all   Social Connections: Socially Integrated (04/09/2022)    Received from [facility]    Social Network     How would you rate your social network (family, work, friends)?: Good participation with social networks   Intimate Partner Violence: Not At Risk (04/09/2022)    Received from [facility]    HITS     Over the last 12 months how often did your partner physically hurt you?: Never     Over the last 12 months how often did your partner insult you or talk down to you?: Never     Over the last 12 months how often did your partner threaten you with physical harm?: Never     Over the last 12 months how often did your partner scream or curse at you?: Never   Housing Stability: Low Risk  (02/11/2024)    Housing Stability Vital Sign     Unable to Pay for Housing in the Last Year: No     Number of Times Moved in the Last Year: 0     Homeless in the Last Year: No      Review of Systems   Constitutional:  Positive for fatigue. Negative for appetite change, diaphoresis, fever and unexpected weight change.   HENT:   Negative for sore throat.    Eyes:  Negative for eye problems and icterus.   Respiratory:  Negative for cough, hemoptysis and  shortness  of breath.    Cardiovascular:  Negative for chest pain, leg swelling and palpitations.   Gastrointestinal:  Negative for abdominal distention, abdominal pain, blood in stool, constipation, diarrhea, nausea and vomiting.   Endocrine: Negative for hot flashes.   Genitourinary:  Negative for dysuria.    Musculoskeletal:  Positive for arthralgias, gait problem and myalgias. Negative for back pain.   Skin:  Negative for itching, rash and wound.   Neurological:  Positive for gait problem and numbness. Negative for dizziness, extremity weakness, headaches, light-headedness, seizures and speech difficulty.   Hematological:  Negative for adenopathy. Does not bruise/bleed easily.   Psychiatric/Behavioral:  Positive for sleep disturbance. Negative for decreased concentration and depression. The patient is not nervous/anxious.       Allergies   Allergen Reactions    Hydrocodone-Acetaminophen  Anxiety and Itching    Losartan Shortness Of Breath    Amlodipine Other (See Comments)     Other reaction(s): Other (See Comments)  Fatigue   Fatigue       Rosuvastatin Other (See Comments)     Other reaction(s): Constipation  Fatigue, constipation  Other reaction(s): Fatigue    Atorvastatin Diarrhea    Ezetimibe      Other reaction(s): Constipation, Other (See Comments)  Constipation and decreased urine output  Constipation and decreased urine output  Other reaction(s): Constipation, Urine output low       Past Medical History:   Diagnosis Date    CAD (coronary artery disease) 2015    Cataract 2017    Cervical disc disorder     Colon polyp 2007    Concussion 1968    Gout 03/2015    Hypertension     controlled with meds    Low back pain Jan 2002    Lumbosacral disc disease March 2003    Neuromuscular disorder [facility]) 2019    Neuropathy May 2019    Osteoarthritis 03/2013    Plantar fasciitis 1995    Skin cancer 2016    Sleep apnea     cpap at night    Spinal stenosis 03/2001    Tendinitis 2015     Past Surgical History:   Procedure  Laterality Date    BACK SURGERY  2021    has had 3 different ones last was in 2021    CARDIAC CATHETERIZATION  2016    CARDIAC SURGERY      CABG    CAROTID STENT PLACEMENT  2015    CARPAL TUNNEL RELEASE  Aug 2019 & Dec 2019    CHOLECYSTECTOMY      COLONOSCOPY N/A 11/29/2020    COLONOSCOPY POLYPECTOMY HOT SNARE performed by Randalyn Romans, MD at [facility] ENDOSCOPY    COLOSTOMY  2007    EP DEVICE PROCEDURE N/A 05/22/2023    Ablation A-fib w complete ep study performed by Carlon Reyes PARAS, MD at [facility] CARDIAC CATH LAB    EYE SURGERY Left     cataract    FRACTURE SURGERY Left     tib-fib fracture    JOINT REPLACEMENT  Hip - 2018    KNEE ARTHROSCOPY Right     LUMBAR FUSION  Dec 2021    LUMBAR LAMINECTOMY  March 2003    SHOULDER SURGERY      SPINAL FUSION  2020    TOTAL HIP ARTHROPLASTY Left     VASECTOMY       Current Outpatient Medications   Medication Sig Dispense Refill    metoprolol  succinate (TOPROL  XL) 50 MG extended release  tablet Take 1 tablet by mouth daily 90 tablet 3    gabapentin  (NEURONTIN ) 600 MG tablet Take 1 tablet by mouth in the morning, at noon, in the evening, and at bedtime. 360 tablet 3    clotrimazole -betamethasone  (LOTRISONE ) 1-0.05 % cream Apply topically 2 times daily. 45 g 1    vitamin B-6 (PYRIDOXINE ) 50 MG tablet Take 1 tablet by mouth daily      allopurinol  (ZYLOPRIM ) 300 MG tablet TAKE 1 TABLET BY MOUTH DAILY 30 tablet 11    zolpidem  (AMBIEN ) 5 MG tablet Take 1 tablet by mouth at bedtime for 90 days. Max Daily Amount: 5 mg 30 tablet 2    apixaban  (ELIQUIS ) 5 MG TABS tablet Take 1 tablet by mouth 2 times daily      azelastine (ASTEPRO) 137 MCG/SPRAY nasal spray USE 1 TO 2 SPRAYS IN EACH NOSTRIL TWICE DAILY AS NEEDED AS DIRECTED      furosemide  (LASIX ) 40 MG tablet Take 1 tablet by mouth daily as needed (take one tablet daily for weight garin greater than 2 lbs in 24 hours or 4 lbs in 48 hours.) 30 tablet 3    Cyanocobalamin (VITAMIN B 12 PO) Take by mouth every morning      Multiple  Vitamins-Minerals (THERAPEUTIC MULTIVITAMIN-MINERALS) tablet Take 1 tablet by mouth daily      vitamin D3 (CHOLECALCIFEROL ) 10 MCG (400 UNIT) TABS tablet Take 2 tablets by mouth daily      CPAP Machine MISC by Does not apply route      colchicine  (MITIGARE ) 0.6 MG capsule Take 1 capsule by mouth as needed for Pain 30 capsule 3    fexofenadine (ALLEGRA) 180 MG tablet Take 1 tablet by mouth daily       No current facility-administered medications for this visit.     OBJECTIVE:  There were no vitals taken for this visit.    ECOG PERFORMANCE STATUS - 2 - Ambulatory and capable of all selfcare but unable to carry out any work activities. Up and about more than 50% of waking hours.  Pain - /10. None/Minimal pain - not affecting QOL     Physical Exam  Constitutional:       General: He is not in acute distress.     Appearance: Normal appearance.   HENT:      Nose: No congestion.   Pulmonary:      Effort: Pulmonary effort is normal. No respiratory distress.   Psychiatric:         Behavior: Behavior normal.         Thought Content: Thought content normal.        Labs:  No results found for this or any previous visit (from the past week).    Imaging: reviewed   PATHOLOGY:           Homocysteine 6.2      Comment: (NOTE)  Homocysteine levels in patients >60 years increase 1-2  umol/L.  Reference Range:  5.0 - 15.0   Factor VIII Activity 95      Comment: (NOTE)  Reference Range:  57 - 163   Antithrombin Activity 108      Comment: (NOTE)  Direct oral anticoagulants such as rivaroxaban , apixaban  and  edoxaban will lead to spuriously elevated antithrombin  activity levels possibly masking a deficiency.  Reference Range:  7 months and older: 75 - 135   Protein C Activity (Chromogenic) 96      Comment: (NOTE)  Reference Range:  17 years  and older: 35 - 180   Protein S Antigen, Free 110      Comment: (NOTE)  Reference Range:  7 months and older: 62 - 157  This test was developed and its performance characteristics  determined by  [facility]. It has not been cleared or approved  by the Food and Drug Administration.   aPTT 26.9      Comment: (NOTE)  This test has not been validated for monitoring  unfractionated heparin  therapy.  aPTT-based therapeutic  ranges for unfractionated heparin  therapy have not been  established.  Consider ordering Heparin  anti-Xa  (unfractionated).  Reference Range:  18 years and older: 22.9 - 30.2   APTT 1:1 Normal Plasma TEST NOT PERFORMED      Comment: (NOTE)  Testing Not Indicated  This test was developed and its performance characteristics  determined by [facility]. It has not been cleared or approved  by the [place]  Food and Drug Administration.   aPTT 1:1 Saline TEST NOT PERFORMED      Comment: (NOTE)  Testing Not Indicated  This test was developed and its performance characteristics  determined by [facility]. It has not been cleared or approved  by the [place]  Food and Drug Administration.   Lac Interpretation Comment      Comment: (NOTE)  A lupus anticoagulant is not detected. All antiphospholipid  antibodies evaluated are normal. As antibody titers may  fluctuate with time, repeat testing may be indicated.  Please contact [facility] if further  clarification is needed.   Act. Prt C Resist w/FV Defic. 2.8      Comment: (NOTE)  The APCR result may be falsely increased (masking an  abnormal, low APCR result) in patients on direct Xa  inhibitor (e.g., rivaroxaban , apixaban , edoxaban) or a  direct thrombin inhibitor (e.g., dabigatran) anticoagulant  therapy due to assay interference by these drugs.  Reference Range:  2.2 - 3.5   DRVVT Screen 79.0 High       Comment: (NOTE)  Reference Range:  <= 47.0   DRVVT Confirm Seconds 58.4      DRVVT RATIO 1.2      Comment: (NOTE)  Reference Range:  0.8 - 1.2   HEXAGONAL PHOSPHOLIPID NEUTRAL [id number] 5      Comment: (NOTE)  This value is NEGATIVE.  This is a qualitative assay and is therefore reported as  positive for lupus anticoagulant or negative.  The  quantitative value is  provided as an aid in diagnosis.  Reference Range:  0 - 11   CARDIOLIPIN AB IGG,CLPN1 <10      Comment: (NOTE)  Reference Range:  Negative: <15  Indeterminate: 15 - 20  Low to medium positive: >20 - 80  High positive: >80   CARDIOLIPIN AB, IGM,CLPN2 <10      Comment: (NOTE)  Reference Range:  Negative: <13  Indeterminate: 13 - 20  Low to medium positive: >20 - 80  High positive: >80   BETA-2 GLYCOPROTEIN I, IGG 499410 <10      Comment: (NOTE)  The reference interval reflects a 3SD or 99th percentile  interval.  Reference Range:  Negative: <21   BETA-2 GLYCOPROTEIN I, IGM 499406 <10      Comment: (NOTE)  The reference interval reflects a 3SD or 99th percentile  interval.  Reference Range:  Negative: <33   BETA-2 GLYCOPROTEIN I, IGA 499408 <10      Comment: (NOTE)  The reference interval reflects a 3SD or 99th percentile  interval.  Reference Range:  Negative: <26   Factor II Gene Mutation Comment      Comment: (NOTE)  G-G (Normal-Normal)  No prothrombin G20210A mutation present.   Interpretation Comment Comment CM Comment CM Comment CM   Comment: (NOTE)  While the patient does not possess this risk factor, other  thrombotic risk factors may be detected through systematic  clinical laboratory analysis.   METHODOLOGY Comment      Comment: (NOTE)  Patient DNA was evaluated for the factor II gene mutation  at nucleotide 79789 using PCR amplification followed by  restriction analysis and gel electrophoresis.   Comments Comment      Comment: (NOTE)  Simultaneous Risks: If a patient possesses two or more  congenital or acquired thrombophilic risk factors, the risk  of thrombosis may rise to more than the sum of the risk  ratios for the individual risk factors. For instance, a  combination of the prothrombin G20210A mutation and the  factor V Leiden mutation may confer an increase in  thrombotic risk in the range of 20-30 fold.  Recommendations for Genetic Counseling: The prothrombin  gene mutation is an inherited  characteristic. If the  mutation is present, we recommend that the patient and  their family consider genetic counseling to obtain  additional information on inheritance and to identify other  family members at risk.  Testing Characteristics: Genetic testing provides  exceptionally high sensitivity and specificity. Inaccurate  results are limited to rare polymorphisms in primer binding  sites and to misidentification of specimens by collectors  or laboratory personnel. This assay detects only the  prothrombin G20210A mutation and does not detect other  genetic abnormalities.  This test was developed and its performance characteristics  determined by [facility]. It has not been cleared or approved  by the Food and Drug Administration.  References: Delores POUR, et al. Br J of Haem. 8002;01:092.  Cumming AM, et al. Br J of Haem. 8002;01:646. Del  [place] and [place] Mol.Diagn. 2001;6(3):201.  Emmerich J, et al. Kerri Fish. 7998;13:190-83.  Margaglione M, et al. [place]. [phone].  Performed At: [facility]  [address] [place], [place] [id number]  Shermon Redell MANO MD Ey:[phone]       ASSESSMENT:  {No diagnosis found. (Refresh or delete this SmartLink)}    Chad Rosario is here for FU     1. 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 Glucose levels are slightly elevated, likely due to recent food intake. Zinc, copper, and LDH levels are normal, indicating no inflammation or hemolysis. Hemoglobin levels satisfactory. The hypercoagulability workup showed normal results, with no signs of antiphospholipid syndrome or inherited mutations. 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 AST levels are marginally increased, possibly due to alcohol  consumption. Total protein and haptoglobin levels are normal, ruling out multiple myeloma and hemolysis. Tests for hepatitis A, B, C, and HIV negative, but he is not immune to hepatitis B.  He will discuss vaccine recs with his PCP.  Iron studies are satisfactory with an iron saturation of about 42%. 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  Microscopic examination of cells reveals healthy red cells, white cells, and platelets, although the platelet count  is low. AFP levels are lower than 1.8.   - Ultrasound showed no concerning features of the liver (aside from fatty liver infiltration), which has not grown since the last imaging and remains at 20 cm. The spleen is of normal size per radiology.  He was referred to GI  - rec he continues w Eliquis  5 mg twice daily.    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 Rox could not perform further interventions due to the occlusion; communicated with DR Rox re pt's case who rec lifelong AC due to chronic/recurrent DVT.  IR intervention was attempted in 2023 with little to no improvement in symptoms.  Pt's common fem vein stenosis should not be stented and did not respond to angioplasty, popliteal vein occ does not stay open due to downstream stenosis.    - no new clotting issues in the arms or legs. Swelling persists without pain.     Neuropathy.  Neuropathy causing balance issues has worsened over the past year. Conduction studies confirmed neuropathy. Currently taking a nerve supplement that has improved symptoms.  To FU with neuro per their recs.       Fatty liver/hepatosplenomegaly - drinks alcohol and hx of fatty liver   - FibroScan showing mild fibrosis (F1), fatty liver and organomegaly.  - Liver function tests show mildly increased AST and elevated bilirubin.  Alcohol consumption can be contributing to liver abnormalities. Advised to avoid alcohol.    - organomegaly contributes to sequestration of wbc/plts - leading to cytopenias.      RESUSCITATION DIRECTIVES/HOSPICE CARE: Full Support    MDM    Lab studies and imaging studies were personally reviewed.  Pertinent old records were reviewed.     Historical:  - Thrombocytopenia can be multifactorial, linked to chronic liver disease, possible nutritional defic, primary BM condition, ITP, alcohol consumption, and genetic predisposition.  Elevated bilirubin levels suggest liver dz/Gilbert's disease.  Prev referred to GI for eval and fatty liver was confirmed.    - needs monitoring of TCP as on AC.  AC should be held when plts <50K or with active bleeding.    - A comprehensive lab workup will be conducted today, including tests for autoimmune causes, viral serologies, and [employer]. AFP will be checked.    - total abdominal ultrasound will be ordered to assess the size of the spleen and liver.  Prev liver at 20-21cm and spleen 16cm.      All questions were asked and answered to the best of my ability.  The patient verbalized understanding and agrees with the plan above.      Asia Kathryne) Nalani, MD  [facility] On[facility]  [address]  [place], [place] [address]  Office : [phone]  Fax : [phone]  "

## 2024-03-15 LAB — EXTENDED CARDIAC HOLTER MONITOR: Body Surface Area: 2.66 m2

## 2024-03-15 MED ORDER — METOPROLOL SUCCINATE ER 25 MG PO TB24
25 | ORAL_TABLET | Freq: Every day | ORAL | 3 refills | 90.00000 days | Status: DC
Start: 2024-03-15 — End: 2024-03-20

## 2024-03-15 NOTE — Telephone Encounter (Signed)
"  Feeling very blah with increased fatigue and no energy and increased SOB on exertion since he D/C'd lisinopril  and metoprolol  and began carvedilol  12.5 mg BID after 03/01/24 visit with Dr. Carlon.   Had not been checking BP or HR until today.   This morning, HR-37-39 with BP-152/74.   No chest pain or palpitations.   Did not take carvedilol , last night or this morning.   Has been taking Eliquis  5 mg BID, carvedilol  12.5 mg BID, and lasix  40 mg qd PRN (took x 2 days, last week).    Patient asks for recommendations for symptomatic low HR. He asks if carvedilol  dose should be adjusted.   "

## 2024-03-15 NOTE — Telephone Encounter (Signed)
"  See triage note. Called patient.   "

## 2024-03-15 NOTE — Telephone Encounter (Signed)
"  Advised patient of Dr. Carmencita response. Advised patient to monitor and record BP and HR and call with any further questions or concerns. Scheduled MA appointment with Dr. Lajoyce on 04/08/24 2:15 pm at [facility]. Patient verbalized understanding.   "

## 2024-03-15 NOTE — Telephone Encounter (Signed)
"  See triage note 12/22  "

## 2024-03-15 NOTE — Telephone Encounter (Signed)
"  MyChart Message  Low HR  (Newest Message First)               03/15/24  9:13 AM  Chad Boots, MA routed this conversation to [facility] Cardiology Triage (Selected Message)  Chad Rosario to [facility] Cardiology Clinical Staff (supporting Chad JINNY Soja, MD)      03/15/24  7:12 AM  My last visit you prescribed Carvedilol  and I stopped taking Lisinopril  and metoprolol .       I've been very tired since.  Friday I started feeling BLAH.  Any activity this weekend made me exhausted and my breathing was labored.  This morning my HR was 37.  I took it twice.     I need to know if I should go back to my old meds or to the ER.  I'm going to copy Dr Lajoyce.     [phone]  "

## 2024-03-15 NOTE — Telephone Encounter (Signed)
"  Pt is calling back and said someone was suppose to call him back, but hasn't heard from anyone.  "

## 2024-03-15 NOTE — Telephone Encounter (Signed)
"  Patient calling about a HR around 37 to 39, feeling fatigued even when walking and SOB.  "

## 2024-03-15 NOTE — Telephone Encounter (Signed)
"  Go back to metoprolol  25 mg po daily. His lisinopril  was held by someone else. Stop coreg . Follow up with Dr. Lajoyce in Jan to further discuss BP meds.      JJS  "

## 2024-03-16 NOTE — Telephone Encounter (Signed)
"  Patient notified of Monitor results per MD note. Patient wanted to let Dr Carlon know that he did not sleep well last night due to chest heaviness. Reports HR in upper 30 this am. SOB with minimal exertion. Patient did restart Metoprolol  as instructed yesterday.  "

## 2024-03-16 NOTE — Telephone Encounter (Signed)
"  Carlon Reyes PARAS, MD to Gailen Saxon, LPN  Blair Query, RN (Selected Message)        03/15/24  5:05 PM  No AF/AFL. 21% PACs. Brief runs of pAT. No AF/AFL.   "

## 2024-03-16 NOTE — Telephone Encounter (Signed)
"  Carlon Reyes PARAS, MD to Me (Selected Message)        03/16/24 11:52 AM  Ok to keep off metoprolol . If chest pain continues, best option is to get an evaluation in ED, I could also attempt to see him this Friday if need be.   "

## 2024-03-16 NOTE — Telephone Encounter (Signed)
"  Patient notified of MD response. Patient will come in Friday at 9:30. Patient voices understanding.  "

## 2024-03-19 ENCOUNTER — Ambulatory Visit
Admit: 2024-03-19 | Discharge: 2024-03-19 | Payer: MEDICARE | Attending: Cardiovascular Disease | Primary: Family Medicine

## 2024-03-19 ENCOUNTER — Inpatient Hospital Stay
Admission: RE | Admit: 2024-03-19 | Discharge: 2024-03-20 | Disposition: A | Discharge status: Home / Self Care | Payer: MEDICARE | Source: Other Acute Inpatient Hospital | Attending: Cardiovascular Disease | Admitting: Cardiovascular Disease

## 2024-03-19 ENCOUNTER — Inpatient Hospital Stay: Admit: 2024-03-19 | Payer: MEDICARE | Primary: Family Medicine

## 2024-03-19 DIAGNOSIS — I442 Atrioventricular block, complete: Secondary | ICD-10-CM

## 2024-03-19 DIAGNOSIS — Z950 Presence of cardiac pacemaker: Secondary | ICD-10-CM

## 2024-03-19 LAB — EKG 12-LEAD
Atrial Rate: 43 {beats}/min
P Axis: 74 degrees
P-R Interval: 480 ms
Q-T Interval: 588 ms
QRS Duration: 90 ms
QTc Calculation (Bazett): 496 ms
R Axis: 0 degrees
T Axis: 8 degrees
Ventricular Rate: 43 {beats}/min

## 2024-03-19 LAB — BASIC METABOLIC PANEL
Anion Gap: 12 mmol/L (ref 7–16)
BUN: 23 mg/dL (ref 8–23)
CO2: 21 mmol/L (ref 20–29)
Calcium: 9.3 mg/dL (ref 8.8–10.2)
Chloride: 105 mmol/L (ref 98–107)
Creatinine: 1.23 mg/dL (ref 0.80–1.30)
Est, Glom Filt Rate: 64 ml/min/1.73m2 (ref >60)
Glucose: 116 mg/dL — ABNORMAL HIGH (ref 70–99)
Potassium: 4.8 mmol/L (ref 3.5–5.1)
Sodium: 137 mmol/L (ref 136–145)

## 2024-03-19 LAB — MAGNESIUM: Magnesium: 1.7 mg/dL — ABNORMAL LOW (ref 1.8–2.4)

## 2024-03-19 LAB — CBC
Hematocrit: 50.5 % — ABNORMAL HIGH (ref 41.1–50.3)
Hemoglobin: 18.3 g/dL — ABNORMAL HIGH (ref 13.6–17.2)
MCH: 33.5 pg — ABNORMAL HIGH (ref 26.1–32.9)
MCHC: 36.2 g/dL — ABNORMAL HIGH (ref 31.4–35.0)
MCV: 92.5 FL (ref 82–102)
MPV: 13 FL — ABNORMAL HIGH (ref 8.8–12.5)
Platelets: 85 K/uL — ABNORMAL LOW (ref 150–450)
RBC: 5.46 M/uL (ref 4.23–5.6)
RDW: 14.7 % — ABNORMAL HIGH (ref 11.9–14.6)
WBC: 9 K/uL (ref 4.3–11.1)
nRBC: 0 K/uL (ref 0.0–0.2)

## 2024-03-19 LAB — ELECTROPHYSIOLOGY PROCEDURE: Body Surface Area: 2.6 m2

## 2024-03-19 MED ORDER — CETIRIZINE HCL 10 MG PO TABS
10 | Freq: Every day | ORAL | Status: DC
Start: 2024-03-19 — End: 2024-03-20
  Administered 2024-03-20: 15:00:00 10 mg via ORAL

## 2024-03-19 MED ORDER — CARVEDILOL 6.25 MG PO TABS
6.25 | Freq: Two times a day (BID) | ORAL | Status: DC
Start: 2024-03-19 — End: 2024-03-20
  Administered 2024-03-20: 07:00:00 6.25 mg via ORAL

## 2024-03-19 MED ORDER — OXYCODONE HCL 5 MG PO TABS
5 | ORAL | Status: DC | PRN
Start: 2024-03-19 — End: 2024-03-20

## 2024-03-19 MED ORDER — LIDOCAINE-EPINEPHRINE (PF) 1 %-1:200000 IJ SOLN
1 | INTRAMUSCULAR | Status: AC
Start: 2024-03-19 — End: 2024-03-19

## 2024-03-19 MED ORDER — SODIUM CHLORIDE 0.9 % IV SOLN
0.9 | INTRAVENOUS | Status: DC | PRN
Start: 2024-03-19 — End: 2024-03-20

## 2024-03-19 MED ORDER — LACTATED RINGERS IV SOLN
INTRAVENOUS | Status: DC
Start: 2024-03-19 — End: 2024-03-20
  Administered 2024-03-19 (×2): via INTRAVENOUS

## 2024-03-19 MED ORDER — ACETAMINOPHEN 325 MG PO TABS
325 | ORAL | Status: DC | PRN
Start: 2024-03-19 — End: 2024-03-20
  Administered 2024-03-19: 23:00:00 650 mg via ORAL

## 2024-03-19 MED ORDER — LIDOCAINE-EPINEPHRINE 1 %-1:100000 IJ SOLN
1 | INTRAMUSCULAR | Status: DC | PRN
Start: 2024-03-19 — End: 2024-03-19
  Administered 2024-03-19: 22:00:00 20 via INTRADERMAL
  Administered 2024-03-19: 22:00:00 10 via INTRADERMAL

## 2024-03-19 MED ORDER — POLYETHYLENE GLYCOL 3350 17 G PO PACK
17 | Freq: Every day | ORAL | Status: DC | PRN
Start: 2024-03-19 — End: 2024-03-20

## 2024-03-19 MED ORDER — LIDOCAINE HCL (PF) 2 % IJ SOLN
2 | Freq: Once | INTRAMUSCULAR | Status: DC | PRN
Start: 2024-03-19 — End: 2024-03-19
  Administered 2024-03-19: 22:00:00 40 via INTRAVENOUS

## 2024-03-19 MED ORDER — PROPOFOL 200 MG/20ML IV EMUL
200 | Freq: Once | INTRAVENOUS | Status: DC | PRN
Start: 2024-03-19 — End: 2024-03-19
  Administered 2024-03-19: 22:00:00 50 via INTRAVENOUS
  Administered 2024-03-19: 22:00:00 100 via INTRAVENOUS

## 2024-03-19 MED ORDER — GABAPENTIN 300 MG PO CAPS
300 | Freq: Two times a day (BID) | ORAL | Status: DC
Start: 2024-03-19 — End: 2024-03-20
  Administered 2024-03-20 (×2): 600 mg via ORAL

## 2024-03-19 MED ORDER — SODIUM CHLORIDE 0.9 % IR SOLN
0.9 | Status: DC | PRN
Start: 2024-03-19 — End: 2024-03-19
  Administered 2024-03-19: 23:00:00 1000

## 2024-03-19 MED ORDER — IOPAMIDOL 76 % IV SOLN
76 | INTRAVENOUS | Status: AC
Start: 2024-03-19 — End: 2024-03-19

## 2024-03-19 MED ORDER — PROPOFOL 1000 MG/100ML IV EMUL
1000 | INTRAVENOUS | Status: AC
Start: 2024-03-19 — End: 2024-03-19

## 2024-03-19 MED ORDER — CEFAZOLIN 2000 MG IN 20 ML SWFI IV SYRINGE (PREMIX)
Status: AC
Start: 2024-03-19 — End: 2024-03-19

## 2024-03-19 MED ORDER — NORMAL SALINE FLUSH 0.9 % IV SOLN
0.9 | INTRAVENOUS | Status: DC | PRN
Start: 2024-03-19 — End: 2024-03-20

## 2024-03-19 MED ORDER — ALLOPURINOL 300 MG PO TABS
300 | Freq: Every day | ORAL | Status: DC
Start: 2024-03-19 — End: 2024-03-20
  Administered 2024-03-20: 15:00:00 300 mg via ORAL

## 2024-03-19 MED ORDER — ACETAMINOPHEN 325 MG PO TABS
325 | ORAL | Status: AC
Start: 2024-03-19 — End: 2024-03-19

## 2024-03-19 MED ORDER — ONDANSETRON HCL 4 MG/2ML IJ SOLN
4 | Freq: Four times a day (QID) | INTRAMUSCULAR | Status: DC | PRN
Start: 2024-03-19 — End: 2024-03-20

## 2024-03-19 MED ORDER — CEFAZOLIN SODIUM 1 G IJ SOLR
1 | Freq: Once | INTRAMUSCULAR | Status: AC
Start: 2024-03-19 — End: 2024-03-19
  Administered 2024-03-19: 22:00:00 3000 mg

## 2024-03-19 MED ORDER — OXYCODONE HCL 5 MG PO TABS
5 | ORAL | Status: DC | PRN
Start: 2024-03-19 — End: 2024-03-20
  Administered 2024-03-20 (×2): 5 mg via ORAL

## 2024-03-19 MED ORDER — IOPAMIDOL 76 % IV SOLN
76 | INTRAVENOUS | Status: DC | PRN
Start: 2024-03-19 — End: 2024-03-19
  Administered 2024-03-19: 22:00:00 10 via INTRAVENOUS
  Administered 2024-03-19: 22:00:00 5 via INTRAVENOUS
  Administered 2024-03-19: 22:00:00 20 via INTRAVENOUS
  Administered 2024-03-19: 22:00:00 15 via INTRAVENOUS
  Administered 2024-03-19: 22:00:00 10 via INTRAVENOUS

## 2024-03-19 MED ORDER — NORMAL SALINE FLUSH 0.9 % IV SOLN
0.9 | Freq: Two times a day (BID) | INTRAVENOUS | Status: DC
Start: 2024-03-19 — End: 2024-03-20
  Administered 2024-03-20 (×2): 10 mL via INTRAVENOUS

## 2024-03-19 MED FILL — ACETAMINOPHEN 325 MG PO TABS: 325 mg | ORAL | Qty: 2 | Fill #0

## 2024-03-19 MED FILL — XYLOCAINE-MPF/EPINEPHRINE 1 %-1:200000 IJ SOLN: 1 %-:200000 | INTRAMUSCULAR | Qty: 30 | Fill #0

## 2024-03-19 MED FILL — CEFAZOLIN 2000 MG IN 20 ML SWFI IV SYRINGE (PREMIX): Qty: 2000 | Fill #0

## 2024-03-19 MED FILL — CEFAZOLIN SODIUM 10 G IJ SOLR: 10 g | INTRAMUSCULAR | Qty: 2000 | Fill #0

## 2024-03-19 MED FILL — DIPRIVAN 1000 MG/100ML IV EMUL: 1000 MG/100ML | INTRAVENOUS | Qty: 100 | Fill #0

## 2024-03-19 MED FILL — ISOVUE-370 76 % IV SOLN: 76 % | INTRAVENOUS | Qty: 100 | Fill #0

## 2024-03-19 NOTE — Progress Notes (Signed)
"  TRANSFER - OUT REPORT:    Verbal report given to LaShane RN on Ladarryl Wrage Chaudhari  being transferred to 412 for routine progression of patient care       Report consisted of patient's Situation, Background, Assessment and   Recommendations(SBAR).     Information from the following report(s) Nurse Handoff Report was reviewed with the receiving nurse.           Lines:   Peripheral IV 03/19/24 Left;Posterior Hand (Active)       Peripheral IV 03/19/24 Posterior;Right Hand (Active)        Opportunity for questions and clarification was provided.      Patient transported with:  Registered Nurse        "

## 2024-03-19 NOTE — Anesthesia Postprocedure Evaluation (Signed)
"  Department of Anesthesiology  Postprocedure Note    Patient: Chad Rosario  MRN: [id number]  Birthdate: [birth year]  Date of evaluation: 03/19/2024    Procedure Summary       Date: 03/19/24 Room / Location: [facility] EP 1 ALL EVENTS / [facility] CARDIAC CATH LAB    Anesthesia Start: 1621 Anesthesia Stop: 1756    Procedure: Insert PPM dual Diagnosis:       Heart block      (Heart block [I45.9])    Providers: Carlon Reyes PARAS, MD Responsible Provider: Altha Alm NOVAK, MD    Anesthesia Type: TIVA ASA Status: 3 - Emergent            Anesthesia Type: No value filed.    Aldrete Phase I: Aldrete Score: 10    Aldrete Phase II:      Anesthesia Post Evaluation    Patient location during evaluation: PACU  Patient participation: complete - patient participated  Level of consciousness: awake and alert  Airway patency: patent  Nausea: well controlled.  Cardiovascular status: acceptable.  Respiratory status: acceptable  Hydration status: stable  Pain management: adequate      There were no known notable events for this encounter.  "

## 2024-03-19 NOTE — Progress Notes (Signed)
"  Air removed per protocol from SafeGuard to left subclavian site s/p PPM.  "

## 2024-03-19 NOTE — Anesthesia Pre Procedure (Signed)
 "Department of Anesthesiology  Preprocedure Note       Name:  Chad Rosario   Age:  68 y.o.  DOB:  [birth year]                                          MRN:  [id number]         Date:  03/19/2024      Surgeon: Clotilde):  Carlon Reyes PARAS, MD    Procedure: Procedure(s):  Insert PPM dual    Medications prior to admission:   Prior to Admission medications   Medication Sig Start Date End Date Taking? Authorizing Provider   metoprolol  succinate (TOPROL  XL) 25 MG extended release tablet Take 1 tablet by mouth daily 03/15/24  Yes Senfield, Reyes PARAS, MD   gabapentin  (NEURONTIN ) 600 MG tablet Take 1 tablet by mouth in the morning, at noon, in the evening, and at bedtime.  Patient taking differently: Take 1 tablet by mouth 2 times daily. 02/13/24 02/12/25 Yes Gretel Round L, PA-C   vitamin B-6 (PYRIDOXINE ) 50 MG tablet Take 1 tablet by mouth daily   Yes [provider]   allopurinol  (ZYLOPRIM ) 300 MG tablet TAKE 1 TABLET BY MOUTH DAILY 12/10/23  Yes Clair Ozell Meth, MD   apixaban  (ELIQUIS ) 5 MG TABS tablet Take 1 tablet by mouth 2 times daily   Yes [provider]   furosemide  (LASIX ) 40 MG tablet Take 1 tablet by mouth daily as needed (take one tablet daily for weight garin greater than 2 lbs in 24 hours or 4 lbs in 48 hours.) 04/19/23  Yes Valetti, Kelly K, PA   Cyanocobalamin (VITAMIN B 12 PO) Take by mouth every morning   Yes [provider]   Multiple Vitamins-Minerals (THERAPEUTIC MULTIVITAMIN-MINERALS) tablet Take 1 tablet by mouth daily   Yes [provider]   vitamin D3 (CHOLECALCIFEROL ) 10 MCG (400 UNIT) TABS tablet Take 2 tablets by mouth daily   Yes [provider]   fexofenadine (ALLEGRA) 180 MG tablet Take 1 tablet by mouth daily   Yes [provider]   clotrimazole -betamethasone  (LOTRISONE ) 1-0.05 % cream Apply topically 2 times daily.  Patient not taking: Reported on 03/19/2024 02/05/24   Lester, Karla L, PA-C   azelastine (ASTEPRO) 137  MCG/SPRAY nasal spray USE 1 TO 2 SPRAYS IN EACH NOSTRIL TWICE DAILY AS NEEDED AS DIRECTED 05/03/23   [provider]   CPAP Machine MISC by Does not apply route    [provider]   colchicine  (MITIGARE ) 0.6 MG capsule Take 1 capsule by mouth as needed for Pain 04/26/21   Gretel Round CROME, PA-C       Current medications:    Current Facility-Administered Medications   Medication Dose Route Frequency Provider Last Rate Last Admin    lactated ringers  infusion   IntraVENous Continuous Carlon Reyes PARAS, MD        ceFAZolin  (ANCEF ) 2,000 mg in sod chloride IRR soln 0.9 % 1,000 mL  2,000 mg Irrigation Once Carlon Reyes PARAS, MD           Allergies:    Allergies   Allergen Reactions    Hydrocodone-Acetaminophen  Anxiety and Itching    Losartan Shortness Of Breath    Amlodipine Other (See Comments)     Other reaction(s): Other (See Comments)  Fatigue   Fatigue       Rosuvastatin Other (  See Comments)     Other reaction(s): Constipation  Fatigue, constipation  Other reaction(s): Fatigue    Atorvastatin Diarrhea    Ezetimibe      Other reaction(s): Constipation, Other (See Comments)  Constipation and decreased urine output  Constipation and decreased urine output  Other reaction(s): Constipation, Urine output low         Problem List:    Patient Active Problem List   Diagnosis Code    Obesity (BMI 30-39.9) E66.9    History of DVT (deep vein thrombosis) Z86.718    Hx of CABG Z95.1    Hypertension I10    Hyperlipidemia E78.5    DOE (dyspnea on exertion) R06.09    Thrombocytopenia D69.6    Neuropathic pain M79.2    Paresthesia R20.2    Bilateral leg weakness R29.898    Foot drop, bilateral M21.371, M21.372    Cramps of lower extremity R25.2    Acute medial meniscal tear S83.249A    Allergic rhinitis J30.9    Ataxia R27.0    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    Chronic pain disorder G89.4    Coronary artery disease involving native coronary artery of native heart without angina pectoris I25.10    Deep vein  thrombosis (DVT) of femoral vein of right lower extremity (HCC) I82.411    Degeneration of intervertebral disc of lumbar region M51.369    Colon adenoma D12.6    Dyslipidemia E78.5    GERD without esophagitis K21.9    Gout of both feet M10.9    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    H/O seasonal allergies Z88.9    Idiopathic gout M10.00    Idiopathic peripheral neuropathy G60.9    IFG (impaired fasting glucose) R73.01    Insomnia G47.00    Low back pain M54.50    Lumbar facet joint pain M54.59    Lumbar postlaminectomy syndrome M96.1    Lumbar radiculopathy, chronic M54.16    Lumbar stenosis M48.061    Obstructive sleep apnea syndrome G47.33    Primary osteoarthritis of left hip M16.12    S/P angioplasty with stent Z95.820    Balance disorder R26.89    Volume overload E87.70    Bilateral lower extremity edema R60.0    Atrial fibrillation with RVR (HCC) I48.91    Congestive heart failure (HCC) I50.9    Persistent atrial fibrillation (HCC) I48.19    Chronic heart failure with preserved ejection fraction (HFpEF) (HCC) I50.32    Wound dehiscence, surgical T81.31XA    Lower extremity edema R60.0    Status post ablation of atrial fibrillation Z98.890, Z86.79    History of cardiac ablation for atrial fibrillation (HCC) Z98.890, I48.91    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) I87.311, L97.919    Non-pressure chronic ulcer of lower leg with fat layer exposed, right (HCC) L97.912    Peripheral polyneuropathy G62.9    Medication management Z79.899    Morbid (severe) obesity due to excess calories (E66.01) E66.01    Body mass index [BMI] 37.0-37.9, adult (S31.62) Z68.37    Thoracic aortic aneurysm without rupture I71.20    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, I87.2    Thrombocytopenia, unspecified D69.6    Nutritional deficiency E63.9    Hepatic steatosis K76.0    Elevated LFTs R79.89    Anticoagulated Z79.01    Impaired gait R26.9    Jock itch  B35.6    PVC's (premature ventricular contractions) I49.3  Premature atrial contractions I49.1       Past Medical History:        Diagnosis Date    CAD (coronary artery disease) 2015    Cataract 2017    Cervical disc disorder     Colon polyp 2007    Concussion 1968    Gout 03/2015    Hypertension     controlled with meds    Low back pain Jan 2002    Lumbosacral disc disease March 2003    Neuromuscular disorder [facility]) 2019    Neuropathy May 2019    Osteoarthritis 03/2013    Plantar fasciitis 1995    Skin cancer 2016    Sleep apnea     cpap at night    Spinal stenosis 03/2001    Tendinitis 2015       Past Surgical History:        Procedure Laterality Date    BACK SURGERY  2021    has had 3 different ones last was in 2021    CARDIAC CATHETERIZATION  2016    CARDIAC SURGERY      CABG    CAROTID STENT PLACEMENT  2015    CARPAL TUNNEL RELEASE  Aug 2019 & Dec 2019    CHOLECYSTECTOMY      COLONOSCOPY N/A 11/29/2020    COLONOSCOPY POLYPECTOMY HOT SNARE performed by Randalyn Romans, MD at [facility] ENDOSCOPY    COLOSTOMY  2007    EP DEVICE PROCEDURE N/A 05/22/2023    Ablation A-fib w complete ep study performed by Carlon Reyes PARAS, MD at [facility] CARDIAC CATH LAB    EYE SURGERY Left     cataract    FRACTURE SURGERY Left     tib-fib fracture    JOINT REPLACEMENT  Hip - 2018    KNEE ARTHROSCOPY Right     LUMBAR FUSION  Dec 2021    LUMBAR LAMINECTOMY  March 2003    SHOULDER SURGERY      SPINAL FUSION  2020    TOTAL HIP ARTHROPLASTY Left     VASECTOMY         Social History:    Social History     Tobacco Use    Smoking status: Never    Smokeless tobacco: Never   Substance Use Topics    Alcohol use: Yes     Alcohol/week: 12.0 standard drinks of alcohol     Types: 6 Cans of beer, 6 Shots of liquor per week     Comment: socially                                Counseling given: Not Answered      Vital Signs (Current):   Vitals:    03/19/24 1248 03/19/24 1251   BP:  (!) 162/81   Pulse: (!) 45    Resp: 19    Temp: 98.3 F (36.8 C)    TempSrc: Oral     SpO2: 92%    Weight: 131.5 kg (290 lb)    Height: 1.854 m (6' 1)                                               BP Readings from Last 3 Encounters:   03/19/24 (!) 162/81   03/19/24 (!) 150/82   03/01/24 ROLLEN)  150/82       NPO Status: Time of last liquid consumption: 0700                        Time of last solid consumption: 2000                        Date of last liquid consumption: 03/19/24                        Date of last solid food consumption: 03/18/24    BMI:   Wt Readings from Last 3 Encounters:   03/19/24 131.5 kg (290 lb)   03/01/24 (!) 137 kg (302 lb)   02/17/24 135.4 kg (298 lb 6.4 oz)     Body mass index is 38.26 kg/m.    CBC:   Lab Results   Component Value Date/Time    WBC 9.0 03/19/2024 12:34 PM    RBC 5.46 03/19/2024 12:34 PM    HGB 18.3 03/19/2024 12:34 PM    HCT 50.5 03/19/2024 12:34 PM    MCV 92.5 03/19/2024 12:34 PM    RDW 14.7 03/19/2024 12:34 PM    PLT 85 03/19/2024 12:34 PM       CMP:   Lab Results   Component Value Date/Time    NA 137 03/19/2024 12:34 PM    K 4.8 03/19/2024 12:34 PM    CL 105 03/19/2024 12:34 PM    CO2 21 03/19/2024 12:34 PM    BUN 23 03/19/2024 12:34 PM    CREATININE 1.23 03/19/2024 12:34 PM    GFRAA >60 12/18/2020 09:12 AM    LABGLOM 64 03/19/2024 12:34 PM    LABGLOM >60 05/28/2022 08:01 AM    GLUCOSE 116 03/19/2024 12:34 PM    GLUCOSE 109 06/10/2023 09:04 AM    CALCIUM 9.3 03/19/2024 12:34 PM    BILITOT 1.1 02/05/2024 11:37 AM    ALKPHOS 61 02/05/2024 11:37 AM    AST 47 02/05/2024 11:37 AM    ALT 45 02/05/2024 11:37 AM       POC Tests: No results for input(s): POCGLU, POCNA, POCK, POCCL, POCBUN, POCHEMO, POCHCT in the last 72 hours.    Coags:   Lab Results   Component Value Date/Time    PROTIME 15.0 09/29/2023 01:47 PM    INR 1.2 09/29/2023 01:47 PM    APTT 31.0 09/29/2023 01:47 PM    APTT 26.9 09/29/2023 11:51 AM    APTT TEST NOT PERFORMED 09/29/2023 11:51 AM       HCG (If Applicable): No results found for: PREGTESTUR, PREGSERUM, HCG, HCGQUANT      ABGs: No results found for: PHART, PO2ART, PCO2ART, HCO3ART, BEART, O2SATART     Type & Screen (If Applicable):  No results found for: LABABO    Drug/Infectious Status (If Applicable):  Lab Results   Component Value Date/Time    HEPCAB NONREACTIVE 09/29/2023 01:47 PM       COVID-19 Screening (If Applicable): No results found for: COVID19        Anesthesia Evaluation    Patient summary reviewed and Nursing notes reviewed     no history of anesthetic complications:  Airway:  Mallampati: III  TM distance: >3 FB   Neck ROM: full  Comment: Thick neck   Mouth opening: > = 3 FB   Dental:       Comment: Fillings    Pulmonary:  breath sounds clear to auscultation  (+)         shortness of breath:      sleep apnea: on CPAP                        Cardiovascular:     Exercise tolerance: good (>4 METS)    (+)     hypertension:        CAD: CABG  dysrhythmias (last dose of eliquis  yesterday. presented to clinic in high degree AV block): atrial fibrillation    CHF:      DOE:  hyperlipidemia                      Rhythm: irregular  Rate: normal             ROS comment: Echo 07/2023 - EF normal, no significant valve issues     PE comment: 2:1 AV block on preop EKG    Neuro/Psych:         ROS comment: Neuropathy, BLE weakness            GI/Hepatic/Renal:    (+)   GERD: well controlled    liver disease:  :    obesity:        Endo/Other:     (+)   :  blood dyscrasia (chronic thrombocytopenia - baseline 50-100k): thrombocytopenia and anticoagulation therapy  arthritis (gout):                     Abdominal:              Vascular:    (+) PVD, aortic or cerebral  DVT  .     Other Findings:            Anesthesia Plan      TIVA     ASA 3 - emergent     (Plan for TIVA. Last dose Eliquis  yesterday)  Induction: intravenous.    MIPS: Postoperative opioids intended.  Anesthetic plan and risks discussed with patient and spouse.                        Alm KATHEE Rhein, MD   03/19/2024          "

## 2024-03-19 NOTE — Progress Notes (Signed)
 "      [facility]  [address], [address]  [place], [place] [address]  PHONE: [phone]        03/19/24      NAME:  Chad Rosario  DOB: [birth year]  MRN: [id number]     Referring Cardiologist: Chad HILARIO Sharps, MD and Chad Lingo, MD     Reason for Consultation: Atrial fibrillation      ASSESSMENT and PLAN:  Atrial fibrillation, mildly persistent, CHADS2VASc = 4 s/p AF ablation (PFA) on 04/2023  HFpEF  CAD s/p CABG  Thrombocytopenia  HTN  HLD  DVT  2:1 heart block with severe symptoms.      68 year old male with a history of mildly persistent AF admitted overnight with recurrent severely symptomatic AF with RVR and he is s/p AF ablation.   He has been feeling poorly and an ECG today demonstrates 2:1 AV block with a HR in the 30s. He will need an urgent PPM.      AF, persistent   -Holding BB.   -s/p AF ablation. Remains in NSR.  -Fatigued but improving, check monitor today to assess chronotropy and for possible arrhythmia.      Stroke ppx  -Continue Eliquis . Previously low platelets. Consider Watchman if bleeding/anemia worsens.     2:1 AV Block  -Urgent admission to hospital and plan for urgent PPM implant.  -Risks include arrhythmia, use of OAC, heart block, HTN, HFpEF, DVT    -Hold BB and Eliquis .     HTN  -His ACEi was recently stopped likely from leg swelling.  -BB was increased but he's done poorly with elevated dose of metoprolol  in the past and metoprolol  is not a good BP medication. Will change to coreg  which is better due to alpha and beta blockade.      HFpEF  -Continue GDMT as tolerated.  -MRA should be considered esp if BP remains elevated as evidence is good with HFpEF.   -Other considerations include SGTL2i and GLP1a.      DVT  -Long term use of OAC? Continue Xarelto .    -Upcoming appt with Chad Rosario to further discuss options, need for long term therapy.      Thrombocytopenia  -currently undergoing evaluation with hematology. Has been >50K and stable. Discussed with pt.   -Likely  ITP.   -Sees Chad Rosario.     Dilated aorta  -Reviewed imaging, per Chad Rosario.     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     Routine cardiac care per Chad Rosario.     EP follow up in 6 months or PRN.     PACEMAKER  Additionally, I discussed with the patient the potential risks of pacemaker implantation, including the risk of bleeding, infection, venous occlusion, DVT/PE, pneumothorax, cardiac tamponade, perforation, need for urgent open heart surgery, device/lead failure, lead dislodgement, heart attack, stroke, arrhythmia, radiation skin injury, kidney damage/failure oversedation, respiratory arrest, and even death.  The patient understands these risks in the context of the potential benefits of the device implantation, and agrees to proceed.  CD    Patient has been instructed and agrees to call our office with any issues or other concerns related to their cardiac condition(s) and/or complaint(s).    No follow-up provider specified.    Thank you for allowing me to participate in the electrophysiologic care of Chad Rosario [facility]. Please contact me if any questions or concerns were to arise.    Chad Rosario.  Chad Zucker MD, MS  Clinical Cardiac Electrophysiology  [facility]  03/19/24  10:09 AM    ===================================================================  Chief Complant:    Chief Complaint   Patient presents with    Atrial Fibrillation    Tachycardia      History:  Chad Rosario is a most pleasant 68 y.o. male with a past medical and cardiac history significant for chronic HFpEF, persistent atrial fibrillation, TAA, CAD status post CABG in 2015, statin intolerance, hypertension, hyperlipidemia, secondary erythrocytosis, chronic venous insufficiency and DVT who presented to ED with irregular heart beat. He is followed by Chad Rosario. He presented to clinic at that time with DOE with an ECG consistent with AF with RVR in January. Similar presentation this time  around. AF was newly diagnosed in January. He was directly admitted to the hospital. He underwent IV diuresis with documented -5 L. He had a TTE in January 2025 that noted a normal EF and mild MR. His ascending aorta was noted to be 4 cm. He was noted to have a moderately dilated LA. 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 He does have issues with a chronically low platelet count to which he sees hematology due to chronic liver disease?    He comes in for follow up. He remains in NSR after AF ablation, few palps. Metoprolol  was increased to 50 mg as well, previously reduced to 25 mg po daily due to fatigue. He's felt much worse lately     Cardiac PMH: (Old records have been reviewed and summarized below)    AF ablation: 05/23/2023      EKG:  (EKG has been independently visualized by me with interpretation below): 2:1 heart block, normal axis, NSST changes.       Monitor: 02/2024  Patient had a min HR of 42 bpm, max HR of 182 bpm, and avg HR of 69 bpm.  Predominant underlying rhythm was Sinus Rhythm. Slight P wave morphology  changes were noted. 2 Ventricular Tachycardia runs occurred, the run with the  fastest interval lasting 4 beats with a max rate of 182 bpm, the longest lasting 6  beats with an avg rate of 134 bpm. 13 Supraventricular Tachycardia runs occurred,  the run with the fastest interval lasting 11 beats with a max rate of 179 bpm, the  longest lasting 15 beats with an avg rate of 96 bpm. Isolated SVEs were frequent  (21.9%, 13104), SVE Couplets were rare (<1.0%, 1919), and SVE Triplets were rare  (<1.0%, 155). Isolated VEs were rare (<1.0%, 3271), VE Couplets were rare (<1.0%,  63), and VE Triplets were rare (<1.0%, 2). Ventricular Bigeminy and Trigeminy were  present.    ECHO: 07/2023  Left Ventricle Normal left ventricular systolic function with a visually estimated EF of 55 - 60%. The LV is poorly visualized in the PLAX view such  that LV wall thickness and LV size cannot be accurately determined on the study.  Indeterminate diastolic function due to MAC.   Left Atrium Left atrium is mildly dilated.   Right Ventricle Right ventricle size is normal. Normal systolic function.   Right Atrium Right atrium is dilated.   Aortic Valve Not well visualized. Mild sclerosis of the aortic valve cusps. No regurgitation. No stenosis.   Mitral Valve There is moderate annular calcification noted. Trace regurgitation. No stenosis noted.   Tricuspid Valve Valve structure is normal. Trace regurgitation. No stenosis noted.   Pulmonic Valve  The pulmonic valve was not well visualized. No regurgitation. No stenosis noted.   Aorta Not well visualized.   IVC/Hepatic Veins IVC diameter is normal or and decreases greater than 50% during inspiration; therefore the estimated right atrial pressure is normal (~3 mmHg).   Pericardium No pericardial effusion.       ECHO: 03/2023  Left Ventricle Normal left ventricular systolic function with a visually estimated EF of 55 - 60%. Left ventricle size is normal. Normal wall thickness. Normal wall motion. Indeterminate diastolic function due to atrial fibrillation.   Left Atrium Left atrium is moderately dilated. LA Vol Index is  39-45 ml/m2.   Right Ventricle Right ventricle size is normal. Normal systolic function. TAPSE is 1.9 cm.   Right Atrium Right atrium is mildly dilated.   Aortic Valve Moderately thickened cusps. Mildly calcified cusps. Moderate sclerosis of the aortic valve cusps. No regurgitation. No stenosis.   Mitral Valve Moderate annular calcification. Mildly thickened, at the anterior leaflet. Mild regurgitation. No stenosis noted.   Tricuspid Valve Valve structure is normal. Trace regurgitation. No stenosis noted. Unable to assess RVSP due to inadequate or insignificant tricuspid regurgitation.   Pulmonic Valve Valve structure is normal. Physiologically normal regurgitation. No stenosis noted.   Aorta Normal sized  aortic root. Ao root diameter is 3.2 cm. Mildly dilated ascending aorta. Ao ascending diameter is 4.0 cm.   IVC/Hepatic Veins IVC diameter is less than or equal to 21 mm and decreases greater than 50% during inspiration; therefore the estimated right atrial pressure is normal (~3 mmHg). IVC size is normal.   Pericardium No pericardial effusion.      Previous Heart Catheterization: n/a     Stress Test: n/a     DEVICE INTERROGATION: n/a     Past Medical History, Past Surgical History, Family history, Social History, and Medications were all reviewed with the patient today and updated as necessary.     Current Outpatient Medications   Medication Sig Dispense Refill    metoprolol  succinate (TOPROL  XL) 25 MG extended release tablet Take 1 tablet by mouth daily 90 tablet 3    gabapentin  (NEURONTIN ) 600 MG tablet Take 1 tablet by mouth in the morning, at noon, in the evening, and at bedtime. (Patient taking differently: Take 1 tablet by mouth 2 times daily.) 360 tablet 3    vitamin B-6 (PYRIDOXINE ) 50 MG tablet Take 1 tablet by mouth daily      allopurinol  (ZYLOPRIM ) 300 MG tablet TAKE 1 TABLET BY MOUTH DAILY 30 tablet 11    apixaban  (ELIQUIS ) 5 MG TABS tablet Take 1 tablet by mouth 2 times daily      azelastine (ASTEPRO) 137 MCG/SPRAY nasal spray USE 1 TO 2 SPRAYS IN EACH NOSTRIL TWICE DAILY AS NEEDED AS DIRECTED      furosemide  (LASIX ) 40 MG tablet Take 1 tablet by mouth daily as needed (take one tablet daily for weight garin greater than 2 lbs in 24 hours or 4 lbs in 48 hours.) 30 tablet 3    Cyanocobalamin (VITAMIN B 12 PO) Take by mouth every morning      Multiple Vitamins-Minerals (THERAPEUTIC MULTIVITAMIN-MINERALS) tablet Take 1 tablet by mouth daily      vitamin D3 (CHOLECALCIFEROL ) 10 MCG (400 UNIT) TABS tablet Take 2 tablets by mouth daily      CPAP Machine MISC by Does not apply route      colchicine  (MITIGARE ) 0.6 MG capsule Take 1 capsule by mouth as needed for Pain 30 capsule 3  fexofenadine (ALLEGRA) 180  MG tablet Take 1 tablet by mouth daily      clotrimazole -betamethasone  (LOTRISONE ) 1-0.05 % cream Apply topically 2 times daily. (Patient not taking: Reported on 03/19/2024) 45 g 1     No current facility-administered medications for this visit.     Allergies   Allergen Reactions    Hydrocodone-Acetaminophen  Anxiety and Itching    Losartan Shortness Of Breath    Amlodipine Other (See Comments)     Other reaction(s): Other (See Comments)  Fatigue   Fatigue       Rosuvastatin Other (See Comments)     Other reaction(s): Constipation  Fatigue, constipation  Other reaction(s): Fatigue    Atorvastatin Diarrhea    Ezetimibe      Other reaction(s): Constipation, Other (See Comments)  Constipation and decreased urine output  Constipation and decreased urine output  Other reaction(s): Constipation, Urine output low         Past Medical History:   Diagnosis Date    CAD (coronary artery disease) 2015    Cataract 2017    Cervical disc disorder     Colon polyp 2007    Concussion 1968    Gout 03/2015    Hypertension     controlled with meds    Low back pain Jan 2002    Lumbosacral disc disease March 2003    Neuromuscular disorder [facility]) 2019    Neuropathy May 2019    Osteoarthritis 03/2013    Plantar fasciitis 1995    Skin cancer 2016    Sleep apnea     cpap at night    Spinal stenosis 03/2001    Tendinitis 2015     Past Surgical History:   Procedure Laterality Date    BACK SURGERY  2021    has had 3 different ones last was in 2021    CARDIAC CATHETERIZATION  2016    CARDIAC SURGERY      CABG    CAROTID STENT PLACEMENT  2015    CARPAL TUNNEL RELEASE  Aug 2019 & Dec 2019    CHOLECYSTECTOMY      COLONOSCOPY N/A 11/29/2020    COLONOSCOPY POLYPECTOMY HOT SNARE performed by Randalyn Romans, MD at [facility] ENDOSCOPY    COLOSTOMY  2007    EP DEVICE PROCEDURE N/A 05/22/2023    Ablation A-fib w complete ep study performed by Carlon Chad PARAS, MD at [facility] CARDIAC CATH LAB    EYE SURGERY Left     cataract    FRACTURE SURGERY Left     tib-fib fracture     JOINT REPLACEMENT  Hip - 2018    KNEE ARTHROSCOPY Right     LUMBAR FUSION  Dec 2021    LUMBAR LAMINECTOMY  March 2003    SHOULDER SURGERY      SPINAL FUSION  2020    TOTAL HIP ARTHROPLASTY Left     VASECTOMY       Family History   Problem Relation Age of Onset    Cancer Mother         Lymp    Stroke Father     Heart Surgery Father     Diabetes Father     Heart Disease Father     Cancer Sister         Breast    Clotting Disorder Maternal Grandfather      Social History     Tobacco Use    Smoking status: Never    Smokeless tobacco: Never  Substance Use Topics    Alcohol use: Yes     Alcohol/week: 12.0 standard drinks of alcohol     Types: 6 Cans of beer, 6 Shots of liquor per week     Comment: socially       ROS:  A comprehensive review of systems was performed with the pertinent positives and negatives as noted in the HPI in addition to:  Review of Systems   Constitutional: Negative.    HENT: Negative.     Eyes: Negative.    Respiratory: Negative.     Cardiovascular: Negative.    Gastrointestinal: Negative.    Endocrine: Negative.    Genitourinary: Negative.    Musculoskeletal: Negative.    Skin: Negative.    Allergic/Immunologic: Negative.    Neurological: Negative.    Hematological: Negative.    Psychiatric/Behavioral: Negative.     All other systems reviewed and are negative.        PHYSICAL EXAM:   BP (!) 150/82   Pulse (!) 42   Ht 1.854 m (6' 1)   BMI 39.84 kg/m      Wt Readings from Last 3 Encounters:   03/01/24 (!) 137 kg (302 lb)   02/17/24 135.4 kg (298 lb 6.4 oz)   02/11/24 133.3 kg (293 lb 12.8 oz)     BP Readings from Last 3 Encounters:   03/19/24 (!) 150/82   03/01/24 (!) 150/82   02/17/24 (!) 140/80     Gen: Well appearing, well developed, no acute distress  Eyes: Pupils equal, round. Extraocular movements are intact  ENT: Oropharynx clear, no oral lesions, normal dentition  CV: S1S2, regular rate and rhythm, no murmurs, rubs or gallops, normal JVD, no carotid bruits, normal distal pulses, no  LEE  Pulm: Clear to auscultation bilaterally, no accessory muscle uses, no wheezes or rales  GI: Soft, NT, ND, +BS  Neuro: Alert and oriented, nonfocal  Psych: Appropriate affect  Skin: Normal color and skin turgor  MSK: Normal muscle bulk and tone    Medical problems and test results were reviewed with the patient today.     No results found for any visits on 03/19/24.  "

## 2024-03-19 NOTE — Addendum Note (Signed)
"  Addended by: CARLON REYES PARAS on: 03/19/2024 12:38 PM     Modules accepted: Orders    "

## 2024-03-19 NOTE — Progress Notes (Signed)
 Report received from [facility] RN. Procedural findings communicated. Intra procedural  medication administration reviewed. Progression of care discussed.     Patient received into CPRU [place] 5 post sheath removal.     Access site without bleeding or swelling yes    Dressing dry and intact yes    Patient instructed to limit movement to left upper extremity    Routine post procedural vital signs and site assessment initiated yes

## 2024-03-20 DIAGNOSIS — Z950 Presence of cardiac pacemaker: Secondary | ICD-10-CM

## 2024-03-20 LAB — BASIC METABOLIC PANEL
Anion Gap: 8 mmol/L (ref 7–16)
BUN: 19 mg/dL (ref 8–23)
CO2: 27 mmol/L (ref 20–29)
Calcium: 9.2 mg/dL (ref 8.8–10.2)
Chloride: 103 mmol/L (ref 98–107)
Creatinine: 1.19 mg/dL (ref 0.80–1.30)
Est, Glom Filt Rate: 67 ml/min/1.73m2 (ref >60)
Glucose: 138 mg/dL — ABNORMAL HIGH (ref 70–99)
Potassium: 5.2 mmol/L — ABNORMAL HIGH (ref 3.5–5.1)
Sodium: 137 mmol/L (ref 136–145)

## 2024-03-20 LAB — CBC WITH AUTO DIFFERENTIAL
Basophils %: 0.4 % (ref 0.0–2.0)
Basophils Absolute: 0.04 K/UL (ref 0.00–0.20)
Eosinophils %: 7.5 % (ref 0.5–7.8)
Eosinophils Absolute: 0.73 K/UL (ref 0.00–0.80)
Hematocrit: 51.3 % — ABNORMAL HIGH (ref 41.1–50.3)
Hemoglobin: 18.2 g/dL — ABNORMAL HIGH (ref 13.6–17.2)
Immature Granulocytes %: 0.4 % (ref 0.0–5.0)
Immature Granulocytes Absolute: 0.04 K/UL (ref 0.0–0.5)
Lymphocytes %: 9.3 % — ABNORMAL LOW (ref 13.0–44.0)
Lymphocytes Absolute: 0.91 K/UL (ref 0.50–4.60)
MCH: 33.2 pg — ABNORMAL HIGH (ref 26.1–32.9)
MCHC: 35.5 g/dL — ABNORMAL HIGH (ref 31.4–35.0)
MCV: 93.6 FL (ref 82–102)
MPV: 11.6 FL (ref 8.8–12.5)
Monocytes %: 7.3 % (ref 4.0–12.0)
Monocytes Absolute: 0.71 K/UL (ref 0.10–1.30)
Neutrophils %: 75.1 % (ref 43.0–78.0)
Neutrophils Absolute: 7.31 K/UL (ref 1.70–8.20)
Platelets: 78 K/uL — ABNORMAL LOW (ref 150–450)
RBC: 5.48 M/uL (ref 4.23–5.6)
RDW: 14.6 % (ref 11.9–14.6)
WBC: 9.7 K/uL (ref 4.3–11.1)
nRBC: 0 K/uL (ref 0.0–0.2)

## 2024-03-20 LAB — EKG 12-LEAD
Atrial Rate: 63 {beats}/min
P Axis: 106 degrees
P-R Interval: 128 ms
Q-T Interval: 542 ms
QRS Duration: 170 ms
QTc Calculation (Bazett): 554 ms
R Axis: -70 degrees
T Axis: 46 degrees
Ventricular Rate: 63 {beats}/min

## 2024-03-20 MED ORDER — CARVEDILOL 12.5 MG PO TABS
12.5 | ORAL_TABLET | Freq: Two times a day (BID) | ORAL | 3 refills | 90.00000 days | Status: AC
Start: 2024-03-20 — End: ?

## 2024-03-20 MED ORDER — CARVEDILOL 12.5 MG PO TABS
12.5 | Freq: Two times a day (BID) | ORAL | Status: DC
Start: 2024-03-20 — End: 2024-03-20

## 2024-03-20 MED ORDER — MORPHINE SULFATE (PF) 2 MG/ML IJ SOLN
2 | Freq: Four times a day (QID) | INTRAMUSCULAR | Status: DC | PRN
Start: 2024-03-20 — End: 2024-03-20
  Administered 2024-03-20: 06:00:00 2 mg via INTRAVENOUS

## 2024-03-20 MED ORDER — CARVEDILOL 12.5 MG PO TABS
12.5 | Freq: Two times a day (BID) | ORAL | Status: DC
Start: 2024-03-20 — End: 2024-03-20
  Administered 2024-03-20: 15:00:00 12.5 mg via ORAL

## 2024-03-20 MED ORDER — HYDROCODONE-ACETAMINOPHEN 5-325 MG PO TABS
5-325 | ORAL_TABLET | ORAL | 0 refills | 4.50000 days | Status: AC | PRN
Start: 2024-03-20 — End: 2024-03-23

## 2024-03-20 MED ORDER — TRAMADOL HCL 50 MG PO TABS
50 | ORAL_TABLET | Freq: Four times a day (QID) | ORAL | 0 refills | 7.00000 days | Status: AC | PRN
Start: 2024-03-20 — End: 2024-03-23

## 2024-03-20 MED FILL — GABAPENTIN 300 MG PO CAPS: 300 mg | ORAL | Qty: 2 | Fill #0

## 2024-03-20 MED FILL — OXYCODONE HCL 5 MG PO TABS: 5 mg | ORAL | Qty: 1 | Fill #0

## 2024-03-20 MED FILL — CETIRIZINE HCL 10 MG PO TABS: 10 mg | ORAL | Qty: 1 | Fill #0

## 2024-03-20 MED FILL — MORPHINE SULFATE 2 MG/ML IJ SOLN: 2 mg/mL | INTRAMUSCULAR | Qty: 1 | Fill #0

## 2024-03-20 MED FILL — CARVEDILOL 6.25 MG PO TABS: 6.25 mg | ORAL | Qty: 1 | Fill #0

## 2024-03-20 MED FILL — CARVEDILOL 12.5 MG PO TABS: 12.5 mg | ORAL | Qty: 1 | Fill #0

## 2024-03-20 MED FILL — ALLOPURINOL 300 MG PO TABS: 300 mg | ORAL | Qty: 1 | Fill #0

## 2024-03-20 NOTE — ACP (Advance Care Planning) (Signed)
"  Advance Care Planning   Healthcare Decision Maker:    Primary Decision Maker: Deronte, Solis - [phone]    Secondary Decision Maker: Leonce Solmon GLENWOOD Child - [phone]    Pt is a FULL code. Pt NOK is pts spouse listed above.     Click here to complete Healthcare Decision Makers including selection of the Healthcare Decision Maker Relationship (ie Primary).            "

## 2024-03-20 NOTE — Management Plan (Signed)
"  Clinical Cardiac Electrophysiology Note    68 year old male with a history of persistent AF now with CHB s/p CRT-P.      -Stable wound, device interrogation and CXR.  -Anticipate d/c home today with planned device clinic follow up.     Chad PARAS. Carlon, MD, MS  Clinical Cardiac Electrophysiology  [facility]    "

## 2024-03-20 NOTE — Care Coordination (Signed)
"  Chart reviewed for discharge planning. Pt discharged home today with family support. Family to provide transportation. No therapy needs. CM spoke with pt at the bedside to complete assessment. Demographics verified. Insurance/PCP verified. Pt has perscription drug coverage. Pt lives with spouse and is able to manage ADL's independently. Pt is retired and is a solicitor. Pt uses a cane to assist with transportation.  No other needs noted from CM.     03/20/24 1146   Service Assessment   Information Provided By Patient   Patient Orientation Alert;Oriented;Person;Place;Situation   Cognition No Apparent Deficit   Primary Caregiver Self   Support Systems Spouse/Significant Other;Children   Patient's Healthcare Decision Maker is: Legal Next of Kin   PCP Verified by CM Yes   Last Visit to PCP Within last 3 months   Prior Functional Level Independent in ADLs/IADLs   History of falls? 0   Current Functional Level at Time of Initial Assessment Independent in ADLs/IADLs   Can patient return to prior living arrangement Yes   Family able to assist with home care needs: Yes   Other than yourself, who should be included in your transition plan for discharge from the hospital? not unless indicated or neccessary   Receives Help From [facility]   Current [employer] None   Social/Functional History   Prior Level of Assist for ADLs Independent   Prior Level of Assist for [employer] Insurance Yes   Prior Level of Assist for Transfers Independent   Ambulation Assistance Independent   Active Driver Yes   Mode of Engineer, Water   Occupation Retired   Surveyor, Minerals Prior to Acute Admission House   Lives With Spouse   Current Services Prior To Admission None   Potential Assistance Needed N/A   Potential Assistance Obtaining Medications No   Patient expects to be discharged to: Home   Home Layout One level   Entrance Stairs - Number of Steps 0   Services At/After Discharge    [employer] Information Provided? No   Who will provide transportation at discharge? Family   Condition of Participation: Discharge Planning   The Plan for Transition of Care is related to the following treatment goals: plan is for pt to discharge home with family support   The Patient and/Or Patient Representative agree with the Discharge Plan? Yes       "

## 2024-03-20 NOTE — Plan of Care (Signed)
"    Problem: Safety - Adult  Goal: Free from fall injury  Outcome: Adequate for Discharge     Problem: Chronic Conditions and Co-morbidities  Goal: Patient's chronic conditions and co-morbidity symptoms are monitored and maintained or improved  Outcome: Adequate for Discharge     Problem: Discharge Planning  Goal: Discharge to home or other facility with appropriate resources  Outcome: Adequate for Discharge     Problem: Pain  Goal: Verbalizes/displays adequate comfort level or baseline comfort level  Outcome: Adequate for Discharge     "

## 2024-03-20 NOTE — Discharge Instructions (Addendum)
 DEVICE IMPLANT FOLLOWUP INSTRUCTIONS  You will be discharged with an occlusive dressing over the incision site. This dressing will be removed at the 10 -14-day postop site check with the Device Clinic. Your new device will be checked at that visit and all your device teaching will be given.   Keep the incision site dry until your follow up. Use a hand-held shower or bath.   Do not submerge or soak in pools or tubs for 6 weeks. If you are discharged with a prescription for antibiotics, please take the full prescription until it is gone.  After your site check, you may shower and get the incision site wet. You may let soap and water run on the incision and pat dry. Please do not apply creams, lotions or powders on or near the incision.   Mild bruising and swelling can be normal after implant and will resolve in a few weeks.     Call the office at [phone] if you have any of the following:  -Signs of infection, such as fever over 100 F, drainage from the incision, redness, significant swelling, or warmth at the incision site.  -Significant pain around the site that gets worse. Mild discomfort can be normal.   -Bleeding from the incision site  -Swelling in the arm on the side of the incision site  -Chest pain or shortness of breath     Your device has the capability of transmitting device information from home to our office using a home monitoring system or phone app. The information will transmit through a cellular connection outside of your home. Your device data is reviewed during working hours to ensure proper device function. You will be given your equipment and instruction upon your hospital discharge.                                                                       -You will be given a sling to wear upon discharge with instructions when it should be worn.   -Do not lift the affected arm above  the shoulder level, on the side the device was put in, for 4 weeks.   -Do not lift or push more than 5 to 10 pounds for 4 weeks on the affected side. Walking is fine and encouraged.   -Driving is restricted for 5-7 days. Long travel is not recommended until after your site check.    -Do not do any vigorous upper body activities, such as golfing, bowling tennis or mowing for about 6 weeks.  -If you work, you may return to work. However, with limitations on lifting for 4 weeks.   -Please avoid any dental work or invasive procedures for 6 weeks, if possible, after implant.     Please avoid strong magnetic fields, large power plant transformers and arc welders. Please stay 10 feet away of electromagnetic fields such as working over a large running engine, stereo speakers and large stereo systems.   Home appliances are safe.  You may operate any electrical or battery-operated device in your home. Modern Devices are seldom affected by normally operating home appliances, such as microwave ovens.  Flying is safe and airport metal detectors will not affect your device, although your device may occasionally set off the  metal detectors. If this happens, show the security guard your identification card. Security wanding over the device is contraindicated.   Cellular Phones should be used with the hand opposite to the side where the device was implanted. The phone should not be carried in the pocket on the side of the device.   CDL licenses are not renewed if you have a defibrillator implanted.   Radiation Therapy should be avoided on or near the device.   Should you require surgery in the future; some electrosurgical devices can interfere with the device function. You should discuss this with your surgeon prior to any operation.  If you are ordered an MRI, Please contact the clinic prior to ensure you have an MRI safe device.  You must wait 6 weeks after implant before you may have an MRI.   Tens units are contraindicated.                                                        Device Clinic [phone]      The patient is being discharged home in stable condition on a low saturated fat, low cholesterol and low salt diet. Pt is instructed to advance activities as tolerated limited to fatigue or shortness of breath. Pt is instructed not to lift anything over 5-10 lbs with affected arm. No pushing or pulling or lifting arm above shoulder. See complete discharge instructions. Pt is instructed to watch PM site for bleeding/oozing, if seen Pt is instructed to apply firm pressure with clean cloth and call office at [phone]. Pt is instructed to watch for signs of infection which include increasing area of redness around site, fever/hot to touch or purulent drainage.Pt is instructed to call office or return to ER for immediate evaluation of any shortness of breath, chest pain or palpitations.

## 2024-03-20 NOTE — Discharge Summary (Signed)
 "           [facility] Discharge Summary     Patient ID:  Chad Rosario  [id number]  68 y.o.  1955/08/07    Admit date: 03/19/2024    Discharge date and time:  03/20/24    Admitting Physician: Reyes JINNY Soja, MD     Discharge Physician: Norleen Skye, APRN - CNP/Dr. Soja    Admission Diagnoses: Heart block [I45.9]  Complete heart block [facility]) [I44.2]    Discharge Diagnoses:     Principal Problem:    Complete heart block [facility])  Active Problems:    Pacemaker  Resolved Problems:    * No resolved hospital problems. *        Cardiology Procedures this admission:  Pacemaker Implantation, CXR  Consults: none    Hospital Course:     68 year old male with a history of mildly persistent AF admitted overnight with recurrent severely symptomatic AF with RVR and he is s/p AF ablation.   He has been feeling poorly and an ECG today demonstrates 2:1 AV block with a HR in the 30s. He will need an urgent PPM.     03/19/24    ELECTROPHYSIOLOGY PROCEDURE 03/19/2024  5:54 PM (Final)    Conclusion  Table formatting from the original result was not included.  OPERATOR: Reyes JINNY. Soja, MD    REFERRING: Arthea Lingo, MD    Pre-Procedure Diagnosis  1. Persistent atrial fibrillation.  2. Complete heart block  3. NYHA class III symptoms.    Procedure Performed  1. Biventricular pacemaker implant.    Rationale:  A biventricular pacing system was chosen given the fact the patient was in complete heart block with anticipated high degree RV pacing and the likelihood of a pacing-induced cardiomyopathy with a traditional dual chamber pacing system is elevated.    Device and Lead Information  Systems Developer Serial # Location Implant/Explant  [id number] [facility] [id number] Left Pectoral Implant 12.26.2025    Lead Model Number Manufacturer Serial Number Lead position Implant/Explant  RA 7841 BSC [id number] RA Appendage Implant 12.26.2025  RV 7842 BSC [id number] RV Apex Implant 12.26.2025  LV 4671 BSC [id number] LV Mid Lateral  Implant 12.26.2025    Lead Sensitivity and Threshold  Lead R or P sensitivity (mv) Threshold (V) Threshold PW (msec) Impedance (ohms) Final output Voltage (V) Final PW (msec)  RA 4.0 0.5 0.4 623 3.5 0.4  RV >25.0 0.7 0.4 798 3.5 0.4  LV 6.9 2.2 0.8 832 3.5 0.8  LV configuration of LV Ring 2 -> Can    Bradycardia Settings  Brady Mode LRL URL Pace AVD (ms) Sense AVD (ms) Rate Response Mode Switching Mode SW Rate  DDD 60 130 180 120 Off On 150    Estimated Longevity: 11.7  years    Impression  Successful implantation of [employer] biventricular permanent pacemaker (MRI conditional).    PLAN:  -Overnight observation.  -Routine CIED instructions.  -Device clinic follow up in 1-2 weeks.  -Routine cardiac care per Dr. Lingo.    Reyes JINNY. Soja, MD, MS  Clinical Cardiac Electrophysiology  [facility]    Signed by: Reyes JINNY Soja, MD on 03/19/2024  5:54 PM'    Follow up CXR showed no pneumothorax. Pt tolerated the procedure well and was taken to the telemetry floor for recovery. The following morning Pt was up feeling well without any complaints of CP, SOB or palpitations. Pt's left subclavian PM site was clean, dry and  intact without hematoma. Pt's labs were WNL. Pt was seen and examined by Dr. Carlon and determined stable and ready for discharge. Pt was instructed to follow PM discharge instructions given by nursing staff.    DISPOSITION: The patient is being discharged home in stable condition on a low saturated fat, low cholesterol and low salt diet. Pt is instructed to advance activities as tolerated limited to fatigue or shortness of breath. Pt is instructed not to lift anything over 5-10 lbs with affected arm. No pushing or pulling or lifting arm above shoulder. See complete discharge instructions. Pt is instructed to watch PM site for bleeding/oozing, if seen Pt is instructed to apply firm pressure with clean cloth and call office at [phone]. Pt is instructed to watch for signs of infection  which include increasing area of redness around site, fever/hot to touch or purulent drainage.Pt is instructed to call office or return to ER for immediate evaluation of any shortness of breath, chest pain or palpitations.      Discharge Exam: BP (!) 185/80   Pulse 76   Temp 98.6 F (37 C) (Oral)   Resp 18   Ht 1.854 m (6' 1)   Wt 134.5 kg (296 lb 8 oz)   SpO2 93%   BMI 39.12 kg/m    Pt has been seen by Carlon MD: see his progress note for exam details.    Recent Results (from the past 24 hours)   Basic Metabolic Panel    Collection Time: 03/19/24 12:34 PM   Result Value Ref Range    Sodium 137 136 - 145 mmol/L    Potassium 4.8 3.5 - 5.1 mmol/L    Chloride 105 98 - 107 mmol/L    CO2 21 20 - 29 mmol/L    Anion Gap 12 7 - 16 mmol/L    Glucose 116 (H) 70 - 99 mg/dL    BUN 23 8 - 23 MG/DL    Creatinine 8.76 9.19 - 1.30 MG/DL    Est, Glom Filt Rate 64 >60 ml/min/1.29m2    Calcium 9.3 8.8 - 10.2 MG/DL   CBC    Collection Time: 03/19/24 12:34 PM   Result Value Ref Range    WBC 9.0 4.3 - 11.1 K/uL    RBC 5.46 4.23 - 5.6 M/uL    Hemoglobin 18.3 (H) 13.6 - 17.2 g/dL    Hematocrit 49.4 (H) 41.1 - 50.3 %    MCV 92.5 82 - 102 FL    MCH 33.5 (H) 26.1 - 32.9 PG    MCHC 36.2 (H) 31.4 - 35.0 g/dL    RDW 85.2 (H) 88.0 - 14.6 %    Platelets 85 (L) 150 - 450 K/uL    MPV 13.0 (H) 8.8 - 12.5 FL    nRBC 0.00 0.0 - 0.2 K/uL   Magnesium     Collection Time: 03/19/24 12:34 PM   Result Value Ref Range    Magnesium  1.7 (L) 1.8 - 2.4 mg/dL   EKG 12 Lead    Collection Time: 03/19/24  1:02 PM   Result Value Ref Range    Ventricular Rate 43 BPM    Atrial Rate 43 BPM    P-R Interval 480 ms    QRS Duration 90 ms    Q-T Interval 588 ms    QTc Calculation (Bazett) 496 ms    P Axis 74 degrees    R Axis 0 degrees    T Axis 8 degrees    Diagnosis  Marked sinus bradycardia with 2:1 avb    Abnormal ECG  When compared with ECG of 22-May-2023 17:26,  Premature atrial complexes are no longer Present  PR interval has increased  Vent. rate has  decreased by  38 bpm  Questionable change in initial forces of Anterior leads  T wave inversion now evident in Inferior leads  Confirmed by [facility]  MD ([facility]), CHRISTOPHER H [phone]) on 03/19/2024 1:24:28 PM     Electrophysiology procedure    Collection Time: 03/19/24  5:49 PM   Result Value Ref Range    Body Surface Area 2.6 m2   EKG 12 lead    Collection Time: 03/19/24  6:24 PM   Result Value Ref Range    Ventricular Rate 63 BPM    Atrial Rate 63 BPM    P-R Interval 128 ms    QRS Duration 170 ms    Q-T Interval 542 ms    QTc Calculation (Bazett) 554 ms    P Axis 106 degrees    R Axis -70 degrees    T Axis 46 degrees    Diagnosis       Ventricular-paced rhythm  Abnormal ECG  When compared with ECG of 19-Mar-2024 13:02,  Electronic ventricular pacemaker has replaced Sinus rhythm  Confirmed by CLAUDENE  MD ([facility]), CHRISTOPHER H [id number]) on 03/20/2024 9:32:51 AM     Basic Metabolic Panel    Collection Time: 03/20/24  6:36 AM   Result Value Ref Range    Sodium 137 136 - 145 mmol/L    Potassium 5.2 (H) 3.5 - 5.1 mmol/L    Chloride 103 98 - 107 mmol/L    CO2 27 20 - 29 mmol/L    Anion Gap 8 7 - 16 mmol/L    Glucose 138 (H) 70 - 99 mg/dL    BUN 19 8 - 23 MG/DL    Creatinine 8.80 9.19 - 1.30 MG/DL    Est, Glom Filt Rate 67 >60 ml/min/1.77m2    Calcium 9.2 8.8 - 10.2 MG/DL   CBC with Auto Differential    Collection Time: 03/20/24  6:36 AM   Result Value Ref Range    WBC 9.7 4.3 - 11.1 K/uL    RBC 5.48 4.23 - 5.6 M/uL    Hemoglobin 18.2 (H) 13.6 - 17.2 g/dL    Hematocrit 48.6 (H) 41.1 - 50.3 %    MCV 93.6 82 - 102 FL    MCH 33.2 (H) 26.1 - 32.9 PG    MCHC 35.5 (H) 31.4 - 35.0 g/dL    RDW 85.3 88.0 - 85.3 %    Platelets 78 (L) 150 - 450 K/uL    MPV 11.6 8.8 - 12.5 FL    nRBC 0.00 0.0 - 0.2 K/uL    Differential Type AUTOMATED      Neutrophils % 75.1 43.0 - 78.0 %    Lymphocytes % 9.3 (L) 13.0 - 44.0 %    Monocytes % 7.3 4.0 - 12.0 %    Eosinophils % 7.5 0.5 - 7.8 %    Basophils % 0.4 0.0 - 2.0 %    Immature Granulocytes % 0.4 0.0 - 5.0 %     Neutrophils Absolute 7.31 1.70 - 8.20 K/UL    Lymphocytes Absolute 0.91 0.50 - 4.60 K/UL    Monocytes Absolute 0.71 0.10 - 1.30 K/UL    Eosinophils Absolute 0.73 0.00 - 0.80 K/UL    Basophils Absolute 0.04 0.00 - 0.20 K/UL    Immature Granulocytes Absolute  0.04 0.0 - 0.5 K/UL         Patient Instructions:     Current Discharge Medication List        START taking these medications    Details   HYDROcodone-acetaminophen  (NORCO) 5-325 MG per tablet Take 1 tablet by mouth every 4 hours as needed for Pain for up to 3 days. Intended supply: 3 days. Take lowest dose possible to manage pain Max Daily Amount: 6 tablets  Qty: 18 tablet, Refills: 0    Comments: Reduce doses taken as pain becomes manageable  Associated Diagnoses: Pacemaker      carvedilol  (COREG ) 12.5 MG tablet Take 1 tablet by mouth 2 times daily (with meals)  Qty: 60 tablet, Refills: 3           CONTINUE these medications which have NOT CHANGED    Details   gabapentin  (NEURONTIN ) 600 MG tablet Take 1 tablet by mouth in the morning, at noon, in the evening, and at bedtime.  Qty: 360 tablet, Refills: 3    Associated Diagnoses: Neuropathic pain      vitamin B-6 (PYRIDOXINE ) 50 MG tablet Take 1 tablet by mouth daily      allopurinol  (ZYLOPRIM ) 300 MG tablet TAKE 1 TABLET BY MOUTH DAILY  Qty: 30 tablet, Refills: 11    Comments: Requesting 1 year supply      apixaban  (ELIQUIS ) 5 MG TABS tablet Take 1 tablet by mouth 2 times daily      furosemide  (LASIX ) 40 MG tablet Take 1 tablet by mouth daily as needed (take one tablet daily for weight garin greater than 2 lbs in 24 hours or 4 lbs in 48 hours.)  Qty: 30 tablet, Refills: 3      Cyanocobalamin (VITAMIN B 12 PO) Take by mouth every morning      Multiple Vitamins-Minerals (THERAPEUTIC MULTIVITAMIN-MINERALS) tablet Take 1 tablet by mouth daily      vitamin D3 (CHOLECALCIFEROL ) 10 MCG (400 UNIT) TABS tablet Take 2 tablets by mouth daily      fexofenadine (ALLEGRA) 180 MG tablet Take 1 tablet by mouth daily       azelastine (ASTEPRO) 137 MCG/SPRAY nasal spray USE 1 TO 2 SPRAYS IN EACH NOSTRIL TWICE DAILY AS NEEDED AS DIRECTED      CPAP Machine MISC by Does not apply route           STOP taking these medications       metoprolol  succinate (TOPROL  XL) 25 MG extended release tablet Comments:   Reason for Stopping:         clotrimazole -betamethasone  (LOTRISONE ) 1-0.05 % cream Comments:   Reason for Stopping:         colchicine  (MITIGARE ) 0.6 MG capsule Comments:   Reason for Stopping:                Signed:  Norleen Skye, APRN - CNP  03/20/2024  10:06 AM  "

## 2024-03-20 NOTE — Progress Notes (Signed)
"  03/19/24  2000: Patient refused Coreg , stated he takes metoprolol .    03/20/24  0015: Pt BP 185/95 and c/o pain 10/10 to left subclavian site, Tillman Fraction, NP notified. Orders to give Morphine  2 mg IV first to see if increased BP is pain related. Left subclavian site slightly swollen, NP notified.     0130: Pt BP 184/102, Tillman Medley, NP notified. Orders to give Coreg  that was due at 1930 since patient is ok with taking medication. Pt stated if it's going to help my blood pressure, I'll take it.              "

## 2024-03-20 NOTE — Progress Notes (Signed)
"  Patient c/o 10/10 pain to left subclavian site, slightly swollen s/p PPM. Tillman Medley, NP notified. New orders for Morphine 2 mg IV Q6 PRN for total of two doses.   "

## 2024-03-23 ENCOUNTER — Encounter

## 2024-03-23 ENCOUNTER — Ambulatory Visit: Admit: 2024-03-23 | Payer: MEDICARE | Primary: Family Medicine

## 2024-03-23 DIAGNOSIS — Z950 Presence of cardiac pacemaker: Principal | ICD-10-CM

## 2024-03-23 LAB — COMPREHENSIVE METABOLIC PANEL
ALT: 34 U/L (ref 8–55)
AST: 46 U/L — ABNORMAL HIGH (ref 15–37)
Albumin/Globulin Ratio: 0.8 — ABNORMAL LOW (ref 1.0–1.9)
Albumin: 3.4 g/dL (ref 3.2–4.6)
Alk Phosphatase: 68 U/L (ref 40–129)
Anion Gap: 7 mmol/L (ref 7–16)
BUN: 24 mg/dL — ABNORMAL HIGH (ref 8–23)
CO2: 28 mmol/L (ref 20–29)
Calcium: 9.5 mg/dL (ref 8.8–10.2)
Chloride: 102 mmol/L (ref 98–107)
Creatinine: 1.22 mg/dL (ref 0.80–1.30)
Est, Glom Filt Rate: 65 ml/min/1.73m2 (ref >60)
Globulin: 4.1 g/dL — ABNORMAL HIGH (ref 2.3–3.5)
Glucose: 105 mg/dL — ABNORMAL HIGH (ref 70–99)
Potassium: 5 mmol/L (ref 3.5–5.1)
Sodium: 138 mmol/L (ref 136–145)
Total Bilirubin: 1.3 mg/dL — ABNORMAL HIGH (ref 0.0–1.2)
Total Protein: 7.5 g/dL (ref 6.3–8.2)

## 2024-03-23 LAB — MAGNESIUM: Magnesium: 2 mg/dL (ref 1.8–2.4)

## 2024-03-23 NOTE — Telephone Encounter (Signed)
"  Patient called stating he has the following issues :    In regard to pacer  Inserted 12/26  SOB exertion  Cramping below sternum  Weak  Wiped out feeling  Tired    "

## 2024-03-23 NOTE — Telephone Encounter (Signed)
"  Patient states he's isn't feeling as well as he would like post CRT-P implant on 03/19/2024.     Complaints of DOE with minimal activity,  2 episodes of an aching situation under heart after eating, fatigue, Home blood pressure readings.   170-180/85. Increase in swelling bilateral knees to feet.     He thinks the Coreg  from 1 month ago is causing the symptoms.   He started taking lasix  40mg  daily 2 days in a row.     Updated transmission reviewed. Presenting- LV LOC. No episodes. BiVp 83%    Reviewed case with Dr. Jannetta who recommends patient switch:  1) Lasix  to Bumex  1mg  BID for 2 days  2) Labs on Friday -MG, CMP  3) follow up with Dr. Lajoyce ASAP    Device appt scheduled to assess lead capture and discuss recommendations.    "

## 2024-03-23 NOTE — Telephone Encounter (Signed)
"  After seeing patient in the office- adjusted device, ordered labs.     Per his wife, he hadn't been taking lasix  like he said so he will take over the next couple of days. Pending blood work also.    Notified him leads look stable from implant, but will re-assess at upcoming visit.     He reports already feeling some better and will call back sooner with any concerns.        "

## 2024-03-23 NOTE — Progress Notes (Addendum)
"  Patient seen for complaints of SOB with minimal exertions and fatigue post CRT-P implant by Dr. Carlon on 03/19/24     Taking Coreg  12.5 BID and Eliquis  5mg  BID. He has been taking lasix  periodically this week because he felt like he had extra weight. BP 167/66     Weight this morning was 292 lb which is his baseline. Some swelling in lower legs. Unhealed skin lesion on right lower leg post Mohs procedure in February.  Audible wheezing with walking to the room, but recovered with rest. He said this has been ongoing prior to device.     Device interrogation showed LV lead not capturing.     Device reprogrammed to RV Only.   Chest xray ordered.   In addition rate response adjusted     CMP & Mg ordered due to frequent PVCs during evaluation.     Device will be re-assessed at upcoming site check. Informed patient to seek ER care for any chest pain,  worsening SOB, or pre-syncopal feelings. He verbalized understanding and will call with anything additional.   "

## 2024-03-24 NOTE — Telephone Encounter (Signed)
"  Patient notified of labs and Dr. Christiane response and verbalized understanding.   "

## 2024-03-26 MED ORDER — BUMETANIDE 1 MG PO TABS
1 | ORAL_TABLET | ORAL | 0 refills | 30.00000 days | Status: DC
Start: 2024-03-26 — End: 2024-04-08

## 2024-03-26 NOTE — Telephone Encounter (Signed)
"  Patient called with concerns about swelling in bilateral lower legs up to mid calf. Swelling  improves some with sleep.  He was unable to put his shoes on.He has been taking lasix  40 mg daily. Home weight this morning was 290 lbs.     Denies being SOB, has some on going wheezing with activity.     Discussed at visit on 03/23/24 about switching to Bumex  1mg  BID for 2 days, but wanted to wait a couple of days. CMP & Mg completed Tuesday. Appt on 04/08/24 with Dr. Lajoyce.     Please advise if okay to proceed with short dose of Bumex .          "

## 2024-03-26 NOTE — Telephone Encounter (Signed)
"  Patient notified per Dr. Jannetta Would still do a couple days of Bumex  1 mg twice daily as discussed and then plan 1 mg daily with additional p.m. dose as needed for weight gain of 3 pounds a day/5 pounds a week (looks like he currently takes Lasix  40 mg daily); defer further management to when he sees Dr. Lajoyce in about a couple weeks.       Rx sent to local pharmacy. Patient verbalized understanding and will call with any additional concerns.   "

## 2024-03-27 ENCOUNTER — Observation Stay
Admission: EM | Admit: 2024-03-27 | Discharge: 2024-03-27 | Disposition: A | Discharge status: Home / Self Care | Payer: MEDICARE | Arrived: VH | Admitting: Internal Medicine

## 2024-03-27 DIAGNOSIS — L089 Local infection of the skin and subcutaneous tissue, unspecified: Secondary | ICD-10-CM

## 2024-03-27 DIAGNOSIS — T148XXA Other injury of unspecified body region, initial encounter: Principal | ICD-10-CM

## 2024-03-27 DIAGNOSIS — T827XXA Infection and inflammatory reaction due to other cardiac and vascular devices, implants and grafts, initial encounter: Secondary | ICD-10-CM

## 2024-03-27 LAB — CBC WITH AUTO DIFFERENTIAL
Basophils %: 0.8 % (ref 0.0–2.0)
Basophils Absolute: 0.07 K/UL (ref 0.00–0.20)
Eosinophils %: 10.8 % — ABNORMAL HIGH (ref 0.5–7.8)
Eosinophils Absolute: 0.91 K/UL — ABNORMAL HIGH (ref 0.00–0.80)
Hematocrit: 50.5 % — ABNORMAL HIGH (ref 41.1–50.3)
Hemoglobin: 17.7 g/dL — ABNORMAL HIGH (ref 13.6–17.2)
Immature Granulocytes %: 0.5 % (ref 0.0–5.0)
Immature Granulocytes Absolute: 0.04 K/UL (ref 0.0–0.5)
Lymphocytes %: 20.4 % (ref 13.0–44.0)
Lymphocytes Absolute: 1.72 K/UL (ref 0.50–4.60)
MCH: 32.8 pg (ref 26.1–32.9)
MCHC: 35 g/dL (ref 31.4–35.0)
MCV: 93.7 FL (ref 82–102)
MPV: 11.9 FL (ref 8.8–12.5)
Monocytes %: 7.9 % (ref 4.0–12.0)
Monocytes Absolute: 0.67 K/UL (ref 0.10–1.30)
Neutrophils %: 59.6 % (ref 43.0–78.0)
Neutrophils Absolute: 5.02 K/UL (ref 1.70–8.20)
Platelets: 107 K/uL — ABNORMAL LOW (ref 150–450)
RBC: 5.39 M/uL (ref 4.23–5.6)
RDW: 14.5 % (ref 11.9–14.6)
WBC: 8.4 K/uL (ref 4.3–11.1)
nRBC: 0 K/uL (ref 0.0–0.2)

## 2024-03-27 LAB — COMPREHENSIVE METABOLIC PANEL
ALT: 32 U/L (ref 8–55)
AST: 54 U/L — ABNORMAL HIGH (ref 15–37)
Albumin/Globulin Ratio: 0.9 — ABNORMAL LOW (ref 1.0–1.9)
Albumin: 3.5 g/dL (ref 3.2–4.6)
Alk Phosphatase: 67 U/L (ref 40–129)
Anion Gap: 9 mmol/L (ref 7–16)
BUN: 23 mg/dL (ref 8–23)
CO2: 27 mmol/L (ref 20–29)
Calcium: 9.5 mg/dL (ref 8.8–10.2)
Chloride: 101 mmol/L (ref 98–107)
Creatinine: 1.37 mg/dL — ABNORMAL HIGH (ref 0.80–1.30)
Est, Glom Filt Rate: 56 ml/min/1.73m2 — ABNORMAL LOW (ref >60)
Globulin: 4.1 g/dL — ABNORMAL HIGH (ref 2.3–3.5)
Glucose: 112 mg/dL — ABNORMAL HIGH (ref 70–99)
Potassium: 4.7 mmol/L (ref 3.5–5.1)
Sodium: 137 mmol/L (ref 136–145)
Total Bilirubin: 1.3 mg/dL — ABNORMAL HIGH (ref 0.0–1.2)
Total Protein: 7.5 g/dL (ref 6.3–8.2)

## 2024-03-27 LAB — SEDIMENTATION RATE: Sed Rate, Automated: 4 mm/h — ABNORMAL LOW

## 2024-03-27 LAB — HIGH SENSITIVITY CRP: CRP, High Sensitivity: 10.9 mg/L — ABNORMAL HIGH (ref 0.0–3.0)

## 2024-03-27 LAB — PROCALCITONIN: Procalcitonin: 0.07 ng/mL (ref 0.00–0.10)

## 2024-03-27 MED ORDER — POTASSIUM CHLORIDE CRYS ER 20 MEQ PO TBCR
20 | ORAL | Status: DC | PRN
Start: 2024-03-27 — End: 2024-03-28

## 2024-03-27 MED ORDER — POLYETHYLENE GLYCOL 3350 17 G PO PACK
17 | Freq: Every day | ORAL | Status: DC | PRN
Start: 2024-03-27 — End: 2024-03-28

## 2024-03-27 MED ORDER — ALLOPURINOL 300 MG PO TABS
300 | Freq: Every day | ORAL | Status: DC
Start: 2024-03-27 — End: 2024-03-28
  Administered 2024-03-28: 14:00:00 300 mg via ORAL

## 2024-03-27 MED ORDER — ONDANSETRON HCL 4 MG/2ML IJ SOLN
4 | Freq: Four times a day (QID) | INTRAMUSCULAR | Status: DC | PRN
Start: 2024-03-27 — End: 2024-03-28

## 2024-03-27 MED ORDER — ONDANSETRON 4 MG PO TBDP
4 | Freq: Three times a day (TID) | ORAL | Status: DC | PRN
Start: 2024-03-27 — End: 2024-03-28

## 2024-03-27 MED ORDER — NORMAL SALINE FLUSH 0.9 % IV SOLN
0.9 | INTRAVENOUS | Status: DC | PRN
Start: 2024-03-27 — End: 2024-03-28

## 2024-03-27 MED ORDER — MAGNESIUM SULFATE 2000 MG/50 ML IVPB PREMIX
2 | INTRAVENOUS | Status: DC | PRN
Start: 2024-03-27 — End: 2024-03-28

## 2024-03-27 MED ORDER — ACETAMINOPHEN 325 MG PO TABS
325 | Freq: Four times a day (QID) | ORAL | Status: DC | PRN
Start: 2024-03-27 — End: 2024-03-28

## 2024-03-27 MED ORDER — VANCOMYCIN 1500 MG IN NS 250 ML (PREMIX) IVPB
Status: DC
Start: 2024-03-27 — End: 2024-03-28

## 2024-03-27 MED ORDER — ACETAMINOPHEN 650 MG RE SUPP
650 | Freq: Four times a day (QID) | RECTAL | Status: DC | PRN
Start: 2024-03-27 — End: 2024-03-28

## 2024-03-27 MED ORDER — GABAPENTIN 300 MG PO CAPS
300 | Freq: Four times a day (QID) | ORAL | Status: DC
Start: 2024-03-27 — End: 2024-03-28
  Administered 2024-03-27 – 2024-03-28 (×3): 600 mg via ORAL

## 2024-03-27 MED ORDER — APIXABAN 5 MG PO TABS
5 | Freq: Two times a day (BID) | ORAL | Status: DC
Start: 2024-03-27 — End: 2024-03-28
  Administered 2024-03-28 (×2): 5 mg via ORAL

## 2024-03-27 MED ORDER — POTASSIUM CHLORIDE 10 MEQ/100ML IV SOLN
10 | INTRAVENOUS | Status: DC | PRN
Start: 2024-03-27 — End: 2024-03-28

## 2024-03-27 MED ORDER — SODIUM CHLORIDE 0.9 % IV SOLN
0.9 | INTRAVENOUS | Status: DC | PRN
Start: 2024-03-27 — End: 2024-03-28

## 2024-03-27 MED ORDER — VANCOMYCIN 2500 MG IN NS 500 ML (PREMIX) IVPB
Freq: Once | Status: AC
Start: 2024-03-27 — End: 2024-03-27
  Administered 2024-03-27: 2500 mg via INTRAVENOUS

## 2024-03-27 MED ORDER — CARVEDILOL 12.5 MG PO TABS
12.5 | Freq: Two times a day (BID) | ORAL | Status: DC
Start: 2024-03-27 — End: 2024-03-28
  Administered 2024-03-27 – 2024-03-28 (×2): 12.5 mg via ORAL

## 2024-03-27 MED ORDER — NORMAL SALINE FLUSH 0.9 % IV SOLN
0.9 | Freq: Two times a day (BID) | INTRAVENOUS | Status: DC
Start: 2024-03-27 — End: 2024-03-28
  Administered 2024-03-28 (×2): 10 mL via INTRAVENOUS

## 2024-03-27 MED ORDER — POTASSIUM BICARB-CITRIC ACID 20 MEQ PO TBEF
20 | ORAL | Status: DC | PRN
Start: 2024-03-27 — End: 2024-03-28

## 2024-03-27 MED FILL — GABAPENTIN 300 MG PO CAPS: 300 mg | ORAL | Qty: 2 | Fill #0

## 2024-03-27 MED FILL — CARVEDILOL 12.5 MG PO TABS: 12.5 mg | ORAL | Qty: 1 | Fill #0

## 2024-03-27 MED FILL — VANCOMYCIN 2500 MG IN NS 500 ML (PREMIX) IVPB: Qty: 500 | Fill #0

## 2024-03-27 NOTE — H&P (Signed)
 "       [facility] Cardiology History & Physical      Date of  Admission: 03/27/2024 11:09 AM     Primary Care Physician: Clair Ozell Meth, MD  Primary Cardiologist: Dr. Carlon and Dr. Lajoyce  Admitting Physician: Dr. Jannetta     CC: drainage from device with possible infection     HPI:  Chad Rosario is a 69 y.o. male with prior h/o persistent A fib s/p AF ablation (PFA) 04/2023, HFpEF, CAD s/p CABG, thrombocytopenia, HTN, HLD, DVT.   Patient was seen in office by Dr. Carlon after feeling poorly and EKG showed 2:1 AV block with Hrs in 30s. Patient was scheduled for urgent PM. Patient underwent a successful implantation of [employer] biventricular permanent pacemaker (MRI conditional) on 03/19/2024 and d/c home on 03/20/2024.   Patient called office several days ago with increased swelling in legs and bumex  1mg  bid for couple days was started then PRN for weight gain.     Patient called answering service this am with drainage coming from device site. I spoke with patient and told to come to ED for further care.    Labs in ED unremarkable. No blood cultures done. Dressing removed by me and  purulent drainage noted on skin and on aqual seal dressing. Incision site with erythema slightly swollen.  Patient denies in fever, chills or pain at site.    Patient reports in 2003 post back surgery got post op infection. Then in Nov 2024 had right shoulder replacement surgery that got post op infection, requiring IV abx, hardware removal then replaced and was on po abx x 6 months and just completed in June 2025.          Past Medical History:   Diagnosis Date    CAD (coronary artery disease) 2015    Cataract 2017    Cervical disc disorder     Colon polyp 2007    Concussion 1968    Gout 03/2015    Hypertension     controlled with meds    Low back pain Jan 2002    Lumbosacral disc disease March 2003    Neuromuscular disorder [facility]) 2019    Neuropathy May 2019    Osteoarthritis 03/2013    Plantar fasciitis 1995     Skin cancer 2016    Sleep apnea     cpap at night    Spinal stenosis 03/2001    Tendinitis 2015      Past Surgical History:   Procedure Laterality Date    BACK SURGERY  2021    has had 3 different ones last was in 2021    CARDIAC CATHETERIZATION  2016    CARDIAC SURGERY      CABG    CAROTID STENT PLACEMENT  2015    CARPAL TUNNEL RELEASE  Aug 2019 & Dec 2019    CHOLECYSTECTOMY      COLONOSCOPY N/A 11/29/2020    COLONOSCOPY POLYPECTOMY HOT SNARE performed by Randalyn Romans, MD at [facility] ENDOSCOPY    COLOSTOMY  2007    EP DEVICE PROCEDURE N/A 05/22/2023    Ablation A-fib w complete ep study performed by Carlon Reyes PARAS, MD at [facility] CARDIAC CATH LAB    EP DEVICE PROCEDURE N/A 03/19/2024    Insert PPM dual performed by Carlon Reyes PARAS, MD at [facility] CARDIAC CATH LAB    EYE SURGERY Left     cataract    FRACTURE SURGERY Left     tib-fib  fracture    JOINT REPLACEMENT  Hip - 2018    KNEE ARTHROSCOPY Right     LUMBAR FUSION  Dec 2021    LUMBAR LAMINECTOMY  March 2003    SHOULDER SURGERY      SPINAL FUSION  2020    TOTAL HIP ARTHROPLASTY Left     VASECTOMY         Allergies   Allergen Reactions    Hydrocodone -Acetaminophen  Anxiety and Itching    Losartan Shortness Of Breath    Amlodipine Other (See Comments)     Other reaction(s): Other (See Comments)  Fatigue   Fatigue       Rosuvastatin Other (See Comments)     Other reaction(s): Constipation  Fatigue, constipation  Other reaction(s): Fatigue    Atorvastatin Diarrhea    Ezetimibe      Other reaction(s): Constipation, Other (See Comments)  Constipation and decreased urine output  Constipation and decreased urine output  Other reaction(s): Constipation, Urine output low        Social History     Socioeconomic History    Marital status: Married     Spouse name: Not on file    Number of children: Not on file    Years of education: Not on file    Highest education level: Not on file   Occupational History    Not on file   Tobacco Use    Smoking status: Never    Smokeless tobacco:  Never   Vaping Use    Vaping status: Never Used   Substance and Sexual Activity    Alcohol use: Not Currently     Comment: socially    Drug use: Never    Sexual activity: Not Currently     Partners: Female   Other Topics Concern    Not on file   Social History Narrative    Not on file     Social Drivers of Health     Financial Resource Strain: Low Risk (04/09/2022)    Received from [facility]    Overall Financial Resource Strain (CARDIA)     Difficulty of Paying Living Expenses: Not hard at all   Food Insecurity: No Food Insecurity (02/11/2024)    Hunger Vital Sign     Worried About Running Out of Food in the Last Year: Never true     Ran Out of Food in the Last Year: Never true   Transportation Needs: No Transportation Needs (02/11/2024)    PRAPARE - Therapist, Art (Medical): No     Lack of Transportation (Non-Medical): No   Physical Activity: Insufficiently Active (08/05/2023)    Exercise Vital Sign     Days of Exercise per Week: 3 days     Minutes of Exercise per Session: 30 min   Stress: No Stress Concern Present (04/09/2022)    Received from [facility] - Occupational Stress Questionnaire     Feeling of Stress : Not at all   Social Connections: Socially Integrated (04/09/2022)    Received from [facility]    Social Network     How would you rate your social network (family, work, friends)?: Good participation with social networks   Intimate Partner Violence: Not At Risk (04/09/2022)    Received from [facility]    HITS     Over the last 12 months how often did your partner physically hurt you?: Never  Over the last 12 months how often did your partner insult you or talk down to you?: Never     Over the last 12 months how often did your partner threaten you with physical harm?: Never     Over the last 12 months how often did your partner scream or curse at you?: Never   Housing Stability: Low Risk  (02/11/2024)    Housing Stability  Vital Sign     Unable to Pay for Housing in the Last Year: No     Number of Times Moved in the Last Year: 0     Homeless in the Last Year: No     Family History   Problem Relation Age of Onset    Cancer Mother         Lymp    Stroke Father     Heart Surgery Father     Diabetes Father     Heart Disease Father     Cancer Sister         Breast    Clotting Disorder Maternal Grandfather         No current facility-administered medications for this encounter.     Current Outpatient Medications   Medication Sig    carvedilol  (COREG ) 12.5 MG tablet Take 1 tablet by mouth 2 times daily (with meals)    gabapentin  (NEURONTIN ) 600 MG tablet Take 1 tablet by mouth in the morning, at noon, in the evening, and at bedtime.    vitamin B-6 (PYRIDOXINE ) 50 MG tablet Take 1 tablet by mouth daily    allopurinol  (ZYLOPRIM ) 300 MG tablet TAKE 1 TABLET BY MOUTH DAILY    apixaban  (ELIQUIS ) 5 MG TABS tablet Take 1 tablet by mouth 2 times daily    azelastine (ASTEPRO) 137 MCG/SPRAY nasal spray     Cyanocobalamin (VITAMIN B 12 PO) Take by mouth every morning    Multiple Vitamins-Minerals (THERAPEUTIC MULTIVITAMIN-MINERALS) tablet Take 1 tablet by mouth daily    vitamin D3 (CHOLECALCIFEROL ) 10 MCG (400 UNIT) TABS tablet Take 2 tablets by mouth daily    CPAP Machine MISC by Does not apply route    bumetanide  (BUMEX ) 1 MG tablet Take 1 tablet twice a day for 2 days, then 1 tablet daily with additional p.m. dose as needed for weight gain of 3 pounds a day/5 pounds a week    furosemide  (LASIX ) 40 MG tablet Take 1 tablet by mouth daily as needed (take one tablet daily for weight garin greater than 2 lbs in 24 hours or 4 lbs in 48 hours.)    fexofenadine (ALLEGRA) 180 MG tablet Take 1 tablet by mouth daily       Review of Systems    Review of Systems   Constitutional: Negative for chills, decreased appetite, diaphoresis, fever, malaise/fatigue, weight gain and weight loss.   HENT:  Negative for congestion.    Eyes:  Negative for blurred vision and  double vision.   Cardiovascular:  Negative for chest pain, dyspnea on exertion, irregular heartbeat, leg swelling, near-syncope, palpitations and syncope.        + pacemaker site drainage    Respiratory:  Negative for cough, shortness of breath and wheezing.    Endocrine: Negative for cold intolerance and heat intolerance.   Hematologic/Lymphatic: Does not bruise/bleed easily.   Skin:  Negative for color change and nail changes.   Musculoskeletal:  Negative for back pain, falls and muscle weakness.   Gastrointestinal:  Negative for bloating, abdominal pain, diarrhea, melena and  nausea.   Genitourinary:  Negative for dysuria and frequency.   Neurological:  Negative for dizziness, focal weakness, headaches, light-headedness and weakness.   Psychiatric/Behavioral:  Negative for altered mental status.           Subjective:   BP (!) 168/83   Pulse 63   Temp 97.3 F (36.3 C) (Oral)   Resp 16   SpO2 98%     No intake/output data recorded.  No intake/output data recorded.    Physical Exam:  General: Well Developed, Well Nourished, No Acute Distress  HEENT: pupils equal and round, no abnormalities noted  Neck: supple, no JVD, no carotid bruits  Heart: S1S2 with RRR without murmurs or gallops  Lungs: Clear throughout auscultation bilaterally without adventitious sounds  Chest: PM site with erythema and + swollen    Abd: soft, nontender, nondistended, with good bowel sounds  Ext: warm, chronic 1-2+ LE  edema, calves supple/nontender, pulses 2+ bilaterally  Skin: warm and dry  Psychiatric: Normal mood and affect  Neurologic: Alert and oriented X 3    Cardiographics  Telemetry: SR  ECG: V paced   Echocardiogram: 08/19/23    ECHO (TTE) COMPLETE (PRN CONTRAST/BUBBLE/STRAIN/3D) 08/19/2023  8:47 AM (Final)    Interpretation Summary    Left Ventricle: Normal left ventricular systolic function with a visually estimated EF of 55 - 60%. The LV is poorly visualized in the PLAX view such that LV wall thickness and LV size cannot be  accurately determined on the study.    Signed by: Arthea CHRISTELLA Lingo, MD on 08/19/2023  8:47 AM     Labs:   Recent Results (from the past 24 hours)   EKG 12 Lead (Chest Pain)    Collection Time: 03/27/24 12:39 PM   Result Value Ref Range    Ventricular Rate 70 BPM    Atrial Rate 70 BPM    P-R Interval 160 ms    QRS Duration 166 ms    Q-T Interval 496 ms    QTc Calculation (Bazett) 535 ms    P Axis 63 degrees    R Axis 246 degrees    T Axis 52 degrees    Diagnosis       Atrial-sensed ventricular-paced rhythm with frequent Premature ventricular   complexes  Abnormal ECG  When compared with ECG of 19-Mar-2024 18:24,  Premature ventricular complexes are now Present  Vent. rate has increased by   7 bpm     CBC with Auto Differential    Collection Time: 03/27/24 12:43 PM   Result Value Ref Range    WBC 8.4 4.3 - 11.1 K/uL    RBC 5.39 4.23 - 5.6 M/uL    Hemoglobin 17.7 (H) 13.6 - 17.2 g/dL    Hematocrit 49.4 (H) 41.1 - 50.3 %    MCV 93.7 82 - 102 FL    MCH 32.8 26.1 - 32.9 PG    MCHC 35.0 31.4 - 35.0 g/dL    RDW 85.4 88.0 - 85.3 %    Platelets 107 (L) 150 - 450 K/uL    MPV 11.9 8.8 - 12.5 FL    nRBC 0.00 0.0 - 0.2 K/uL    Differential Type AUTOMATED      Neutrophils % 59.6 43.0 - 78.0 %    Lymphocytes % 20.4 13.0 - 44.0 %    Monocytes % 7.9 4.0 - 12.0 %    Eosinophils % 10.8 (H) 0.5 - 7.8 %    Basophils % 0.8 0.0 -  2.0 %    Immature Granulocytes % 0.5 0.0 - 5.0 %    Neutrophils Absolute 5.02 1.70 - 8.20 K/UL    Lymphocytes Absolute 1.72 0.50 - 4.60 K/UL    Monocytes Absolute 0.67 0.10 - 1.30 K/UL    Eosinophils Absolute 0.91 (H) 0.00 - 0.80 K/UL    Basophils Absolute 0.07 0.00 - 0.20 K/UL    Immature Granulocytes Absolute 0.04 0.0 - 0.5 K/UL   CMP    Collection Time: 03/27/24 12:43 PM   Result Value Ref Range    Sodium 137 136 - 145 mmol/L    Potassium 4.7 3.5 - 5.1 mmol/L    Chloride 101 98 - 107 mmol/L    CO2 27 20 - 29 mmol/L    Anion Gap 9 7 - 16 mmol/L    Glucose 112 (H) 70 - 99 mg/dL    BUN 23 8 - 23 MG/DL    Creatinine  8.62 (H) 0.80 - 1.30 MG/DL    Est, Glom Filt Rate 56 (L) >60 ml/min/1.17m2    Calcium 9.5 8.8 - 10.2 MG/DL    Total Bilirubin 1.3 (H) 0.0 - 1.2 MG/DL    ALT 32 8 - 55 U/L    AST 54 (H) 15 - 37 U/L    Alk Phosphatase 67 40 - 129 U/L    Total Protein 7.5 6.3 - 8.2 g/dL    Albumin 3.5 3.2 - 4.6 g/dL    Globulin 4.1 (H) 2.3 - 3.5 g/dL    Albumin/Globulin Ratio 0.9 (L) 1.0 - 1.9     Sedimentation Rate    Collection Time: 03/27/24 12:43 PM   Result Value Ref Range    Sed Rate, Automated 4 (L) 15 mm/hr   High sensitivity CRP    Collection Time: 03/27/24 12:43 PM   Result Value Ref Range    CRP, High Sensitivity 10.9 (H) 0.0 - 3.0 mg/L   Procalcitonin    Collection Time: 03/27/24 12:43 PM   Result Value Ref Range    Procalcitonin 0.07 0.00 - 0.10 ng/mL       Patient has been seen and examined by Dr. Jannetta and he agrees with the following assessment and plan:     Assessment/Plan:       Principal Problem:    Infected pacemaker, initial encounter- admit to obs; bl cultures x2 then start IV vanco. Consulted pharmacy to dose Vanco post labs.  EP to see in am.     Active Problems:    Pacemaker- s/p successful implantation of [employer] biventricular permanent pacemaker (MRI conditional) on 03/19/2024 and d/c home on 03/20/2024.       History of DVT (deep vein thrombosis)- on eliquis        Hypertension- controlled      Bilateral lower extremity edema- been chronic      Persistent A fib - s/p AF ablation (PFA) 04/2023    TRACIE SMITH, APRN - CNP  03/27/2024 3:03 PM     I have personally seen and examined patient and agree with above assessment. I agree and confirm with findings with additional details/exceptions as listed below:    History of coronary disease with prior CABG, thrombocytopenia, persistent atrial fibrillation with prior ablation from 04/2023.  Patient was recently seen in the office by Dr. Carlon and was noted to be in 2:1 AV block with symptomatic bradycardia; patient underwent implantation of a biventricular  device on 03/19/2024.  Initially noted with some increased lower extremity swelling with a triage  call and Bumex  1 mg twice daily was attempted for couple days.  Called the answering service this a.m. with reported drainage from his device site.  Was told to come to the ED for further evaluation.  On presentation, WBCs noted at around 8.4; BMP relatively unremarkable with mildly elevated creatinine at 1.37.  Patient denies any fevers or chills.  Reports prior history of surgical related postoperative infections with recent most from 01/2023 after right shoulder replacement requiring hardware removal and prolonged antibiotics.    Physical exam  General Alert and oriented no acute distress, neck supple with no lymphadenopathy no JVD appreciated, cardiovascular exam regular rate and rhythm; pacemaker site appears erythematous, extremities with 1+ edema with some stasis changes, neurologically, no focal deficits.    Assessment/plan    Infected pacemaker site  - Suspect pocket infection; no current evidence to suggest systemic infection; check blood cultures however.  - Attempt to obtain cultures from pacemaker site.  - For now, begin IV vancomycin .  - Discussed if confirmed pocket infection, will need device extraction.  - EP to evaluate in AM.    Persistent atrial fibrillation  - Prior ablation from 04/2023; on Eliquis .    Further recommendations pending clinical course    Elouise MARLA Bloomer, MD  03/27/2024  7:27 PM   "

## 2024-03-27 NOTE — Progress Notes (Signed)
"  TRANSFER - IN REPORT:    Verbal report received from [place], RN on Chad Rosario being received from [facility] ED  for routine progression of care.     Report consisted of patients Situation, Background, Assessment and Recommendations(SBAR).     Information from the following report(s) SBAR and ED Summary was reviewed. Opportunity for questions and clarification was provided.      Assessment completed upon patients arrival to unit and care assumed.     Patient received to room 425. Patient connected to monitor and assessment completed. Plan of care reviewed. Patient oriented to room and call light. Patient aware to use call light to communicate any chest pain or needs.       "

## 2024-03-27 NOTE — Progress Notes (Signed)
"  VANCO DAILY NOTE  [facility]   Pharmacy Pharmacokinetic Monitoring Service - Vancomycin     Consulting Provider: Claudene Solomons APRN  Indication: SSI  Target Concentration: Goal AUC/MIC 400-600 mg*hr/L  Day of Therapy: 1  Additional Antimicrobials: n/a    Pertinent Laboratory Values:   Wt Readings from Last 1 Encounters:   03/20/24 134.5 kg (296 lb 8 oz)     Temp Readings from Last 1 Encounters:   03/27/24 97.3 F (36.3 C) (Oral)     Recent Labs     03/27/24  1243   BUN 23   CREATININE 1.37*   WBC 8.4   PROCAL 0.07     CrCl cannot be calculated (Unknown ideal weight.).    No results found for: VANCOTROUGH, VANCORANDOM    MRSA Nasal Swab: N/A. Non-respiratory infection    Assessment:  Date/Time Dose Concentration AUC         Note: Serum concentrations collected for AUC dosing may appear elevated if collected in close proximity to the dose administered, this is not necessarily an indication of toxicity    Plan:  Dosing recommendations based on Bayesian software  Start vancomycin  2500mg  IV loading dose followed by 1500mg  IV q18h  Anticipated AUC of 518 and trough concentration of 15.6 at steady state  Renal labs as indicated   Vancomycin  concentrations will be ordered as clinically appropriate  Pharmacy will continue to monitor patient and adjust therapy as indicated    Thank you for the consult,  Carlin Camps, [facility]    "

## 2024-03-27 NOTE — ED Provider Notes (Signed)
 "   Emergency Department Provider Note       [facility] EMERGENCY DEPT   PCP: Clair Ozell Meth, MD   Age: 69 y.o.   Sex: male     DISPOSITION Decision To Admit 03/27/2024 02:34:15 PM    ICD-10-CM    1. Wound infection  T14.8XXA     L08.9           Medical Decision Making     In summary this is a 69 year old male patient presenting for evaluation after he has been having drainage coming from area where his pacemaker was just implanted.  Cardiology was consulted and they evaluated the patient and will be admitting him.  Patient history, ROS, physical exam, labs, radiological workup and all pertinent findings were discussed with attending Dr. Merribeth.  ED Course as of 03/27/24 1435   Sat Mar 27, 2024   1107 Sent a message to Dr. Elouise Bloomer of cardiology to ask for recommendations.  Per the patient, he was told to contact cardiology as soon as he was here as they wanted to, evaluate the patient personally [NR]   1145 Still waiting to hear back from cards [NR]   1147 Cards recommended basic labs and will be coming to evaluate patient [NR]   1430 Cardiology will be admitting patient [NR]      ED Course User Index  [NR] Antoine Netter, PA     I independently ordered and reviewed each unique test.    Discussion with external consultants.  Shared medical decision making was utilized in creating the patients health plan today.  I reviewed external records: provider visit note from outside specialist.           The patient was admitted and I have discussed patient management with the admitting provider.  The management of this patient was discussed with an external consultant.    Sepsis Exclusion Criteria - the patient is NOT to be included for SEP-1 Core Measure based on my evaluation at 2:35 PM EST due to: 2+ SIRS criteria are not met.         History     69 year old male patient presents today complaining of drainage from his wound.  Reports he had a pacemaker placed on December 26 and has started noticing dark brown  drainage coming from out under the bandage site.  He states it does not really hurt unless he pushes directly on the pacemaker.  He denies any other symptoms at this time . denies any fevers or chills.  Denies chest pain or shortness of breath.  He states he contacted his cardiologist who told to come to the emergency department today so they could evaluate him personally.    The history is provided by the patient. No language interpreter was used.       ROS     Review of Systems   Constitutional:  Negative for fever.   HENT:  Negative for facial swelling and trouble swallowing.    Eyes:  Negative for photophobia and visual disturbance.   Respiratory:  Negative for cough and shortness of breath.    Cardiovascular:  Negative for chest pain.   Gastrointestinal:  Negative for abdominal pain and vomiting.   Genitourinary:  Negative for dysuria.   Musculoskeletal:  Negative for myalgias and neck stiffness.   Skin:  Positive for wound.   Neurological:  Negative for dizziness, syncope, weakness and light-headedness.        Physical Exam     Vitals signs  and nursing note reviewed:  Vitals:    03/27/24 1046 03/27/24 1400   BP: (!) 181/104 (!) 168/83   Pulse: 63    Resp: 16    Temp: 97.3 F (36.3 C)    TempSrc: Oral    SpO2: 98% 98%      Physical Exam  Vitals reviewed.   Constitutional:       General: He is not in acute distress.     Appearance: Normal appearance. He is not ill-appearing, toxic-appearing or diaphoretic.      Comments: Pleasant and cooperative   HENT:      Head: Normocephalic and atraumatic.      Right Ear: External ear normal.      Left Ear: External ear normal.      Nose: Nose normal.      Mouth/Throat:      Mouth: Mucous membranes are moist.   Eyes:      General:         Right eye: No discharge.         Left eye: No discharge.   Cardiovascular:      Rate and Rhythm: Normal rate.   Pulmonary:      Effort: Pulmonary effort is normal. No respiratory distress.   Musculoskeletal:      Cervical back: Normal  range of motion and neck supple.   Skin:     General: Skin is warm and dry.      Coloration: Skin is not jaundiced.           Comments: Bandage in place over the left side chest wall with dark brown and serous drainage noted.  Minimally tender to palpation.  No significant erythema.   Neurological:      General: No focal deficit present.      Mental Status: He is alert. Mental status is at baseline.        Procedures     Procedures    Orders placed during this emergency department visit:     Orders Placed This Encounter   Procedures    CBC with Auto Differential    CMP    Sedimentation Rate    High sensitivity CRP    Procalcitonin    EKG 12 Lead (Chest Pain)        Medications given during this emergency department visit:   Medications - No data to display    New prescriptions:     New Prescriptions    No medications on file        Past History and Complexity:     Past Medical History:   Diagnosis Date    CAD (coronary artery disease) 2015    Cataract 2017    Cervical disc disorder     Colon polyp 2007    Concussion 1968    Gout 03/2015    Hypertension     controlled with meds    Low back pain Jan 2002    Lumbosacral disc disease March 2003    Neuromuscular disorder [facility]) 2019    Neuropathy May 2019    Osteoarthritis 03/2013    Plantar fasciitis 1995    Skin cancer 2016    Sleep apnea     cpap at night    Spinal stenosis 03/2001    Tendinitis 2015        Past Surgical History:   Procedure Laterality Date    BACK SURGERY  2021    has had 3 different ones last was in 2021  CARDIAC CATHETERIZATION  2016    CARDIAC SURGERY      CABG    CAROTID STENT PLACEMENT  2015    CARPAL TUNNEL RELEASE  Aug 2019 & Dec 2019    CHOLECYSTECTOMY      COLONOSCOPY N/A 11/29/2020    COLONOSCOPY POLYPECTOMY HOT SNARE performed by Randalyn Romans, MD at [facility] ENDOSCOPY    COLOSTOMY  2007    EP DEVICE PROCEDURE N/A 05/22/2023    Ablation A-fib w complete ep study performed by Carlon Reyes PARAS, MD at [facility] CARDIAC CATH LAB    EP DEVICE PROCEDURE N/A  03/19/2024    Insert PPM dual performed by Carlon Reyes PARAS, MD at [facility] CARDIAC CATH LAB    EYE SURGERY Left     cataract    FRACTURE SURGERY Left     tib-fib fracture    JOINT REPLACEMENT  Hip - 2018    KNEE ARTHROSCOPY Right     LUMBAR FUSION  Dec 2021    LUMBAR LAMINECTOMY  March 2003    SHOULDER SURGERY      SPINAL FUSION  2020    TOTAL HIP ARTHROPLASTY Left     VASECTOMY          Social History     Socioeconomic History    Marital status: Married     Spouse name: None    Number of children: None    Years of education: None    Highest education level: None   Tobacco Use    Smoking status: Never    Smokeless tobacco: Never   Vaping Use    Vaping status: Never Used   Substance and Sexual Activity    Alcohol use: Not Currently     Comment: socially    Drug use: Never    Sexual activity: Not Currently     Partners: Female     Social Drivers of Health     Financial Resource Strain: Low Risk (04/09/2022)    Received from [facility]    Overall Financial Resource Strain (CARDIA)     Difficulty of Paying Living Expenses: Not hard at all   Food Insecurity: No Food Insecurity (02/11/2024)    Hunger Vital Sign     Worried About Running Out of Food in the Last Year: Never true     Ran Out of Food in the Last Year: Never true   Transportation Needs: No Transportation Needs (02/11/2024)    PRAPARE - Therapist, Art (Medical): No     Lack of Transportation (Non-Medical): No   Physical Activity: Insufficiently Active (08/05/2023)    Exercise Vital Sign     Days of Exercise per Week: 3 days     Minutes of Exercise per Session: 30 min   Stress: No Stress Concern Present (04/09/2022)    Received from [facility] Occupational Health - Occupational Stress Questionnaire     Feeling of Stress : Not at all   Social Connections: Socially Integrated (04/09/2022)    Received from [facility]    Social Network     How would you rate your social network (family, work, friends)?: Good  participation with social networks   Intimate Partner Violence: Not At Risk (04/09/2022)    Received from [facility]    HITS     Over the last 12 months how often did your partner physically hurt you?: Never     Over the last 12 months  how often did your partner insult you or talk down to you?: Never     Over the last 12 months how often did your partner threaten you with physical harm?: Never     Over the last 12 months how often did your partner scream or curse at you?: Never   Housing Stability: Low Risk  (02/11/2024)    Housing Stability Vital Sign     Unable to Pay for Housing in the Last Year: No     Number of Times Moved in the Last Year: 0     Homeless in the Last Year: No        Previous Medications    ALLOPURINOL  (ZYLOPRIM ) 300 MG TABLET    TAKE 1 TABLET BY MOUTH DAILY    APIXABAN  (ELIQUIS ) 5 MG TABS TABLET    Take 1 tablet by mouth 2 times daily    AZELASTINE (ASTEPRO) 137 MCG/SPRAY NASAL SPRAY        BUMETANIDE  (BUMEX ) 1 MG TABLET    Take 1 tablet twice a day for 2 days, then 1 tablet daily with additional p.m. dose as needed for weight gain of 3 pounds a day/5 pounds a week    CARVEDILOL  (COREG ) 12.5 MG TABLET    Take 1 tablet by mouth 2 times daily (with meals)    CPAP MACHINE MISC    by Does not apply route    CYANOCOBALAMIN (VITAMIN B 12 PO)    Take by mouth every morning    FEXOFENADINE (ALLEGRA) 180 MG TABLET    Take 1 tablet by mouth daily    FUROSEMIDE  (LASIX ) 40 MG TABLET    Take 1 tablet by mouth daily as needed (take one tablet daily for weight garin greater than 2 lbs in 24 hours or 4 lbs in 48 hours.)    GABAPENTIN  (NEURONTIN ) 600 MG TABLET    Take 1 tablet by mouth in the morning, at noon, in the evening, and at bedtime.    MULTIPLE VITAMINS-MINERALS (THERAPEUTIC MULTIVITAMIN-MINERALS) TABLET    Take 1 tablet by mouth daily    VITAMIN B-6 (PYRIDOXINE ) 50 MG TABLET    Take 1 tablet by mouth daily    VITAMIN D3 (CHOLECALCIFEROL ) 10 MCG (400 UNIT) TABS TABLET    Take 2 tablets by mouth daily         Results from this emergency department visit:      Results for orders placed or performed during the hospital encounter of 03/27/24   CBC with Auto Differential   Result Value Ref Range    WBC 8.4 4.3 - 11.1 K/uL    RBC 5.39 4.23 - 5.6 M/uL    Hemoglobin 17.7 (H) 13.6 - 17.2 g/dL    Hematocrit 49.4 (H) 41.1 - 50.3 %    MCV 93.7 82 - 102 FL    MCH 32.8 26.1 - 32.9 PG    MCHC 35.0 31.4 - 35.0 g/dL    RDW 85.4 88.0 - 85.3 %    Platelets 107 (L) 150 - 450 K/uL    MPV 11.9 8.8 - 12.5 FL    nRBC 0.00 0.0 - 0.2 K/uL    Differential Type AUTOMATED      Neutrophils % 59.6 43.0 - 78.0 %    Lymphocytes % 20.4 13.0 - 44.0 %    Monocytes % 7.9 4.0 - 12.0 %    Eosinophils % 10.8 (H) 0.5 - 7.8 %    Basophils % 0.8 0.0 - 2.0 %  Immature Granulocytes % 0.5 0.0 - 5.0 %    Neutrophils Absolute 5.02 1.70 - 8.20 K/UL    Lymphocytes Absolute 1.72 0.50 - 4.60 K/UL    Monocytes Absolute 0.67 0.10 - 1.30 K/UL    Eosinophils Absolute 0.91 (H) 0.00 - 0.80 K/UL    Basophils Absolute 0.07 0.00 - 0.20 K/UL    Immature Granulocytes Absolute 0.04 0.0 - 0.5 K/UL   CMP   Result Value Ref Range    Sodium 137 136 - 145 mmol/L    Potassium 4.7 3.5 - 5.1 mmol/L    Chloride 101 98 - 107 mmol/L    CO2 27 20 - 29 mmol/L    Anion Gap 9 7 - 16 mmol/L    Glucose 112 (H) 70 - 99 mg/dL    BUN 23 8 - 23 MG/DL    Creatinine 8.62 (H) 0.80 - 1.30 MG/DL    Est, Glom Filt Rate 56 (L) >60 ml/min/1.63m2    Calcium 9.5 8.8 - 10.2 MG/DL    Total Bilirubin 1.3 (H) 0.0 - 1.2 MG/DL    ALT 32 8 - 55 U/L    AST 54 (H) 15 - 37 U/L    Alk Phosphatase 67 40 - 129 U/L    Total Protein 7.5 6.3 - 8.2 g/dL    Albumin 3.5 3.2 - 4.6 g/dL    Globulin 4.1 (H) 2.3 - 3.5 g/dL    Albumin/Globulin Ratio 0.9 (L) 1.0 - 1.9     Sedimentation Rate   Result Value Ref Range    Sed Rate, Automated 4 (L) 15 mm/hr   High sensitivity CRP   Result Value Ref Range    CRP, High Sensitivity 10.9 (H) 0.0 - 3.0 mg/L   Procalcitonin   Result Value Ref Range    Procalcitonin 0.07 0.00 - 0.10 ng/mL    EKG 12 Lead (Chest Pain)   Result Value Ref Range    Ventricular Rate 70 BPM    Atrial Rate 70 BPM    P-R Interval 160 ms    QRS Duration 166 ms    Q-T Interval 496 ms    QTc Calculation (Bazett) 535 ms    P Axis 63 degrees    R Axis 246 degrees    T Axis 52 degrees    Diagnosis       Atrial-sensed ventricular-paced rhythm with frequent Premature ventricular   complexes  Abnormal ECG  When compared with ECG of 19-Mar-2024 18:24,  Premature ventricular complexes are now Present  Vent. rate has increased by   7 bpm           No orders to display                No results for input(s): COVID19 in the last 72 hours.     Voice dictation software was used during the making of this note.  This software is not perfect and grammatical and other typographical errors may be present.  This note has not been completely proofread for errors.     Antoine Netter, [place]  03/27/24 1435    "

## 2024-03-27 NOTE — ED Triage Notes (Signed)
"  Pt arrives for complaint of possible infection to his pacemaker site. Pacemaker was placed here on 12/26. States he doesn't feel bad and denies symptoms of CP, SOB, weakness, dizziness.   "

## 2024-03-27 NOTE — ED Notes (Signed)
"  TRANSFER - OUT REPORT:    Verbal report given to Izetta, RN on Chad Rosario  being transferred to 425 for routine progression of patient care       Report consisted of patient's Situation, Background, Assessment and   Recommendations(SBAR).     Information from the following report(s) Nurse Handoff Report was reviewed with the receiving nurse.    Kinder Fall Assessment:    Presents to emergency department  because of falls (Syncope, seizure, or loss of consciousness): No  Age > 70: No  Altered Mental Status, Intoxication with alcohol or substance confusion (Disorientation, impaired judgment, poor safety awaremess, or inability to follow instructions): No  Impaired Mobility: Ambulates or transfers with assistive devices or assistance; Unable to ambulate or transer.: No  Nursing Judgement: No          Lines:   Peripheral IV 03/27/24 Right Antecubital (Active)        Opportunity for questions and clarification was provided.      Patient transported with:  Alexandro Rosebush, Terrance, RN  03/27/24 1503    "

## 2024-03-27 NOTE — Progress Notes (Signed)
"  4 Eyes Skin Assessment     NAME:  Chad Rosario  DATE OF BIRTH:  08-17-1955  MEDICAL RECORD NUMBER:  [id number]    The patient is being assessed for  Admission    I agree that at least one RN has performed a thorough Head to Toe Skin Assessment on the patient. ALL assessment sites listed below have been assessed.      Areas assessed by both nurses:    Shoulders, Back, Chest, Arms, Elbows, Hands, Sacrum. Buttock, Coccyx, Ischium, and Legs. Feet and Heels  L subclavian PPM with drainage and possible signs of infection      Does the Patient have a Wound? No noted wound(s)       Braden Prevention initiated by RN: No  Wound Care Orders initiated by RN: No    For hospital-acquired stage 1 & 2 and ALL Stage 3,4, Unstageable, DTI, NWPT, and Complex wounds: place order IP Wound Care/Ostomy Nurse Eval and Treat by RN under ORDER ENTRY: No    New Ostomies, if present place, Ostomy referral order under ORDER ENTRY: No     Nurse 1 eSignature: Electronically signed by KATHERINE LARSON, RN on 03/27/24 at 6:35 PM EST    **SHARE this note so that the co-signing nurse can place an eSignature**    Nurse 2 eSignature: Electronically signed by Earna Rummer, RN on 03/27/24 at 6:36 PM EST   "

## 2024-03-27 NOTE — Plan of Care (Signed)
"    Problem: Chronic Conditions and Co-morbidities  Goal: Patient's chronic conditions and co-morbidity symptoms are monitored and maintained or improved  Outcome: Progressing     Problem: Discharge Planning  Goal: Discharge to home or other facility with appropriate resources  Outcome: Progressing     Problem: Safety - Adult  Goal: Free from fall injury  Outcome: Progressing     Problem: ABCDS Injury Assessment  Goal: Absence of physical injury  Outcome: Progressing     "

## 2024-03-28 LAB — BASIC METABOLIC PANEL W/ REFLEX TO MG FOR LOW K
Anion Gap: 9 mmol/L (ref 7–16)
BUN: 22 mg/dL (ref 8–23)
CO2: 23 mmol/L (ref 20–29)
Calcium: 8.6 mg/dL — ABNORMAL LOW (ref 8.8–10.2)
Chloride: 103 mmol/L (ref 98–107)
Creatinine: 1.06 mg/dL (ref 0.80–1.30)
Est, Glom Filt Rate: 76 ml/min/1.73m2 (ref >60)
Glucose: 130 mg/dL — ABNORMAL HIGH (ref 70–99)
Potassium: 4.3 mmol/L (ref 3.5–5.1)
Sodium: 136 mmol/L (ref 136–145)

## 2024-03-28 LAB — CBC
Hematocrit: 47.1 % (ref 41.1–50.3)
Hemoglobin: 17 g/dL (ref 13.6–17.2)
MCH: 33.5 pg — ABNORMAL HIGH (ref 26.1–32.9)
MCHC: 36.1 g/dL — ABNORMAL HIGH (ref 31.4–35.0)
MCV: 92.7 FL (ref 82–102)
MPV: 12.1 FL (ref 8.8–12.5)
Platelets: 101 K/uL — ABNORMAL LOW (ref 150–450)
RBC: 5.08 M/uL (ref 4.23–5.6)
RDW: 14.3 % (ref 11.9–14.6)
WBC: 9.5 K/uL (ref 4.3–11.1)
nRBC: 0 K/uL (ref 0.0–0.2)

## 2024-03-28 LAB — EKG 12-LEAD
Atrial Rate: 70 {beats}/min
P Axis: 63 degrees
P-R Interval: 160 ms
Q-T Interval: 496 ms
QRS Duration: 166 ms
QTc Calculation (Bazett): 535 ms
R Axis: 246 degrees
T Axis: 52 degrees
Ventricular Rate: 70 {beats}/min

## 2024-03-28 MED ORDER — DOXYCYCLINE HYCLATE 100 MG PO TABS
100 | ORAL_TABLET | Freq: Two times a day (BID) | ORAL | 0 refills | 7.00000 days | Status: AC
Start: 2024-03-28 — End: 2024-04-04

## 2024-03-28 MED FILL — VANCOMYCIN 1500 MG IN NS 250 ML (PREMIX) IVPB: Qty: 250 | Fill #0

## 2024-03-28 MED FILL — GABAPENTIN 300 MG PO CAPS: 300 mg | ORAL | Qty: 2 | Fill #0

## 2024-03-28 MED FILL — CARVEDILOL 12.5 MG PO TABS: 12.5 mg | ORAL | Qty: 1 | Fill #0

## 2024-03-28 MED FILL — ELIQUIS 5 MG PO TABS: 5 mg | ORAL | Qty: 1 | Fill #0

## 2024-03-28 MED FILL — ALLOPURINOL 300 MG PO TABS: 300 mg | ORAL | Qty: 1 | Fill #0

## 2024-03-28 NOTE — Discharge Summary (Signed)
 "                  [facility] Discharge Summary     Patient ID:  Chad Rosario  [id number]  69 y.o.  1956-03-05    Admit date: 03/27/2024    Discharge date:  03/28/24     Admitting Physician: Elouise MARLA Bloomer, MD     Discharge Physician: Raziyah Vanvleck, APRN - CNP/Dr. Avel    Admission Diagnoses: Wound infection [T14.8XXA, L08.9]  Pacemaker infection, initial encounter [T82.7XXA]    Discharge Diagnoses:   Patient Active Problem List    Diagnosis Date Noted    Contact dermatitis due to adhesives 03/28/2024    Pacemaker 03/20/2024    Status post ablation of atrial fibrillation 05/23/2023    History of cardiac ablation for atrial fibrillation (HCC) 05/23/2023    Thrombocytopenia 01/11/2021    Obesity (BMI 30-39.9) 11/23/2020    History of DVT (deep vein thrombosis) 11/23/2020    Hx of CABG 11/23/2020    Hypertension 11/23/2020    Hyperlipidemia 11/23/2020    DOE (dyspnea on exertion) 11/23/2020    Complete heart block (HCC) 03/19/2024    PVC's (premature ventricular contractions) 02/17/2024    Premature atrial contractions 02/17/2024    Jock itch 02/05/2024    Impaired gait 01/26/2024    Anticoagulated 11/09/2023    Thrombocytopenia, unspecified 10/11/2023    Nutritional deficiency 10/11/2023    Hepatic steatosis 10/11/2023    Elevated LFTs 10/11/2023    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 10/09/2023    Thoracic aortic aneurysm without rupture 08/19/2023    Morbid (severe) obesity due to excess calories (E66.01) 08/08/2023    Body mass index [BMI] 37.0-37.9, adult (Z68.37) 08/08/2023    Peripheral polyneuropathy 07/04/2023    Medication management 07/04/2023    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) 06/03/2023    Non-pressure chronic ulcer of lower leg with fat layer exposed, right (HCC) 06/03/2023    Wound dehiscence, surgical 05/22/2023    Lower extremity edema 05/22/2023    Persistent atrial fibrillation (HCC) 04/29/2023    Chronic heart failure with preserved  ejection fraction (HFpEF) (HCC) 04/29/2023    Volume overload 04/17/2023    Bilateral lower extremity edema 04/17/2023    Atrial fibrillation with RVR (HCC) 04/17/2023    Balance disorder 04/10/2023    H/O seasonal allergies 07/22/2022    Acute medial meniscal tear 11/30/2021    Neuropathic pain 10/18/2021    Paresthesia 10/18/2021    Bilateral leg weakness 10/18/2021    Foot drop, bilateral 10/18/2021    Cramps of lower extremity 10/18/2021    Idiopathic chronic gout of multiple sites without tophus 07/05/2020    Insomnia 05/23/2020    Primary osteoarthritis of left hip 02/22/2019    Lumbar facet joint pain 12/25/2018    Lumbar postlaminectomy syndrome 12/25/2018    Ataxia 06/29/2018    Chronic pain disorder 06/16/2018    GERD without esophagitis 06/16/2018    Lumbar radiculopathy, chronic 03/02/2018    Idiopathic peripheral neuropathy 05/30/2017    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    IFG (impaired fasting glucose) 11/14/2016    Low back pain 11/14/2016    Obstructive sleep apnea syndrome 11/14/2016    Deep vein thrombosis (DVT) of femoral vein of right lower extremity (HCC) 11/01/2015    Gout of both feet 06/28/2015    Idiopathic gout 06/28/2015    Colon adenoma 03/09/2015    Dyslipidemia 05/05/2014    Coronary  artery disease involving native coronary artery of native heart without angina pectoris 11/16/2013    S/P angioplasty with stent 10/30/2013    Lumbar stenosis 05/18/2012    Degeneration of intervertebral disc of lumbar region 05/14/2012    Allergic rhinitis 01/17/2011       Cardiology Procedures this admission:  none  Consults: none    Hospital Course: Patient presented to the emergency department with drainage from pacemaker dressing site. Patient had h/o post op infections in 2003 and Nov 2024 as well. It was felt to admit for possible PM site infection. Blood cultures were obtained that were negative this am. He was given IV vanco x 2 doses during his hospital stay.   The morning of 03/28/2024, Dr.  Avel saw patient and felt likely contact derm related to derma bond adhesive. The patient all other labs were unremarkable and patient was ready for discharge.  The patient was instructed on the importance of medication compliance and outpatient follow up.  The patient will follow up  tomorrow at device clinic to have device site check (office will call patient with appt time) and patient will keep already scheduled appt on Friday as well. He will be d/c home with sterile 4x4 and paper tape and change 1-2 times per day if drainage noted. Rx doxy x 7 days as well. Discussed to continue lasix /bumex  as previously ordered along with daily weights to monitor volume status closely.     DISPOSITION: The patient is being discharged home in stable condition on a low saturated fat, low cholesterol and low salt diet. The patient is instructed to advance activities as tolerated to the limit of fatigue or shortness of breath. The patient is instructed to call the office or return to the ER for immediate evaluation for any severe shortness of breath, chest pain, prolonged palpitations, near syncope or syncope.    Discharge Exam: BP (!) 156/85   Pulse 70   Temp 100.4 F (38 C)   Resp 16   Wt 130.9 kg (288 lb 8 oz)   SpO2 92%   BMI 38.06 kg/m   Patient has been seen by Dr. Avel: see his progress note for exam details.    Left chest: PM site still with erythema (slightly improved this am) with scant amount yellowish drainage noted on old dressing site.     Recent Results (from the past 24 hours)   EKG 12 Lead (Chest Pain)    Collection Time: 03/27/24 12:39 PM   Result Value Ref Range    Ventricular Rate 70 BPM    Atrial Rate 70 BPM    P-R Interval 160 ms    QRS Duration 166 ms    Q-T Interval 496 ms    QTc Calculation (Bazett) 535 ms    P Axis 63 degrees    R Axis 246 degrees    T Axis 52 degrees    Diagnosis       Atrial-sensed ventricular-paced rhythm with frequent Premature ventricular   complexes  Abnormal  ECG  When compared with ECG of 19-Mar-2024 18:24,  Premature ventricular complexes are now Present  Vent. rate has increased by   7 bpm  Confirmed by MATHIAS, MD ([facility]), SHAWN [id number]) on 03/27/2024 7:43:45 PM     CBC with Auto Differential    Collection Time: 03/27/24 12:43 PM   Result Value Ref Range    WBC 8.4 4.3 - 11.1 K/uL    RBC 5.39 4.23 - 5.6 M/uL  Hemoglobin 17.7 (H) 13.6 - 17.2 g/dL    Hematocrit 49.4 (H) 41.1 - 50.3 %    MCV 93.7 82 - 102 FL    MCH 32.8 26.1 - 32.9 PG    MCHC 35.0 31.4 - 35.0 g/dL    RDW 85.4 88.0 - 85.3 %    Platelets 107 (L) 150 - 450 K/uL    MPV 11.9 8.8 - 12.5 FL    nRBC 0.00 0.0 - 0.2 K/uL    Differential Type AUTOMATED      Neutrophils % 59.6 43.0 - 78.0 %    Lymphocytes % 20.4 13.0 - 44.0 %    Monocytes % 7.9 4.0 - 12.0 %    Eosinophils % 10.8 (H) 0.5 - 7.8 %    Basophils % 0.8 0.0 - 2.0 %    Immature Granulocytes % 0.5 0.0 - 5.0 %    Neutrophils Absolute 5.02 1.70 - 8.20 K/UL    Lymphocytes Absolute 1.72 0.50 - 4.60 K/UL    Monocytes Absolute 0.67 0.10 - 1.30 K/UL    Eosinophils Absolute 0.91 (H) 0.00 - 0.80 K/UL    Basophils Absolute 0.07 0.00 - 0.20 K/UL    Immature Granulocytes Absolute 0.04 0.0 - 0.5 K/UL   CMP    Collection Time: 03/27/24 12:43 PM   Result Value Ref Range    Sodium 137 136 - 145 mmol/L    Potassium 4.7 3.5 - 5.1 mmol/L    Chloride 101 98 - 107 mmol/L    CO2 27 20 - 29 mmol/L    Anion Gap 9 7 - 16 mmol/L    Glucose 112 (H) 70 - 99 mg/dL    BUN 23 8 - 23 MG/DL    Creatinine 8.62 (H) 0.80 - 1.30 MG/DL    Est, Glom Filt Rate 56 (L) >60 ml/min/1.65m2    Calcium 9.5 8.8 - 10.2 MG/DL    Total Bilirubin 1.3 (H) 0.0 - 1.2 MG/DL    ALT 32 8 - 55 U/L    AST 54 (H) 15 - 37 U/L    Alk Phosphatase 67 40 - 129 U/L    Total Protein 7.5 6.3 - 8.2 g/dL    Albumin 3.5 3.2 - 4.6 g/dL    Globulin 4.1 (H) 2.3 - 3.5 g/dL    Albumin/Globulin Ratio 0.9 (L) 1.0 - 1.9     Sedimentation Rate    Collection Time: 03/27/24 12:43 PM   Result Value Ref Range    Sed Rate, Automated 4 (L) 15  mm/hr   High sensitivity CRP    Collection Time: 03/27/24 12:43 PM   Result Value Ref Range    CRP, High Sensitivity 10.9 (H) 0.0 - 3.0 mg/L   Procalcitonin    Collection Time: 03/27/24 12:43 PM   Result Value Ref Range    Procalcitonin 0.07 0.00 - 0.10 ng/mL   Culture, Blood 1    Collection Time: 03/27/24  6:28 PM    Specimen: Blood   Result Value Ref Range    Special Requests LEFT  Antecubital        Culture NO GROWTH AFTER 12 HOURS     Culture, Blood 2    Collection Time: 03/27/24  7:36 PM    Specimen: Blood   Result Value Ref Range    Special Requests LEFT  Antecubital        Culture NO GROWTH AFTER 11 HOURS     Basic Metabolic Panel w/ Reflex to MG  Collection Time: 03/28/24  5:16 AM   Result Value Ref Range    Sodium 136 136 - 145 mmol/L    Potassium 4.3 3.5 - 5.1 mmol/L    Chloride 103 98 - 107 mmol/L    CO2 23 20 - 29 mmol/L    Anion Gap 9 7 - 16 mmol/L    Glucose 130 (H) 70 - 99 mg/dL    BUN 22 8 - 23 MG/DL    Creatinine 8.93 9.19 - 1.30 MG/DL    Est, Glom Filt Rate 76 >60 ml/min/1.86m2    Calcium 8.6 (L) 8.8 - 10.2 MG/DL   CBC    Collection Time: 03/28/24  5:16 AM   Result Value Ref Range    WBC 9.5 4.3 - 11.1 K/uL    RBC 5.08 4.23 - 5.6 M/uL    Hemoglobin 17.0 13.6 - 17.2 g/dL    Hematocrit 52.8 58.8 - 50.3 %    MCV 92.7 82 - 102 FL    MCH 33.5 (H) 26.1 - 32.9 PG    MCHC 36.1 (H) 31.4 - 35.0 g/dL    RDW 85.6 88.0 - 85.3 %    Platelets 101 (L) 150 - 450 K/uL    MPV 12.1 8.8 - 12.5 FL    nRBC 0.00 0.0 - 0.2 K/uL         Patient Instructions:   Current Discharge Medication List        START taking these medications    Details   doxycycline  hyclate (VIBRA -TABS) 100 MG tablet Take 1 tablet by mouth 2 times daily for 7 days  Qty: 14 tablet, Refills: 0           CONTINUE these medications which have NOT CHANGED    Details   zolpidem  (AMBIEN ) 5 MG tablet Take 1 tablet by mouth nightly as needed for Sleep. Max Daily Amount: 5 mg      carvedilol  (COREG ) 12.5 MG tablet Take 1 tablet by mouth 2 times daily (with  meals)  Qty: 60 tablet, Refills: 3      gabapentin  (NEURONTIN ) 600 MG tablet Take 1 tablet by mouth in the morning, at noon, in the evening, and at bedtime.  Qty: 360 tablet, Refills: 3    Associated Diagnoses: Neuropathic pain      vitamin B-6 (PYRIDOXINE ) 50 MG tablet Take 1 tablet by mouth daily      allopurinol  (ZYLOPRIM ) 300 MG tablet TAKE 1 TABLET BY MOUTH DAILY  Qty: 30 tablet, Refills: 11    Comments: Requesting 1 year supply      apixaban  (ELIQUIS ) 5 MG TABS tablet Take 1 tablet by mouth 2 times daily      azelastine (ASTEPRO) 137 MCG/SPRAY nasal spray       Cyanocobalamin (VITAMIN B 12 PO) Take by mouth every morning      Multiple Vitamins-Minerals (THERAPEUTIC MULTIVITAMIN-MINERALS) tablet Take 1 tablet by mouth daily      vitamin D3 (CHOLECALCIFEROL ) 10 MCG (400 UNIT) TABS tablet Take 2 tablets by mouth daily      CPAP Machine MISC by Does not apply route      bumetanide  (BUMEX ) 1 MG tablet Take 1 tablet twice a day for 2 days, then 1 tablet daily with additional p.m. dose as needed for weight gain of 3 pounds a day/5 pounds a week  Qty: 30 tablet, Refills: 0      furosemide  (LASIX ) 40 MG tablet Take 1 tablet by mouth daily as needed (take one tablet  daily for weight garin greater than 2 lbs in 24 hours or 4 lbs in 48 hours.)  Qty: 30 tablet, Refills: 3      fexofenadine (ALLEGRA) 180 MG tablet Take 1 tablet by mouth daily              Signed:  Neliah Cuyler, APRN - CNP  03/28/2024  10:47 AM   "

## 2024-03-28 NOTE — ACP (Advance Care Planning) (Signed)
"  Advance Care Planning   Healthcare Decision Maker:    Primary Decision Maker: Dominique, [place] - [phone]    Secondary Decision Maker: Leonce Solmon [place] Child - [phone]    Pt is a FULL code. Pt NOK listed above.     Click here to complete Healthcare Decision Makers including selection of the Healthcare Decision Maker Relationship (ie Primary).            "

## 2024-03-28 NOTE — Discharge Instructions (Signed)
"       Dermatitis: Care Instructions  Overview  Dermatitis is the general name used for any rash or inflammation of the skin. Different kinds of dermatitis cause different kinds of rashes. Common causes of a rash include new medicines, plants (such as poison oak or poison ivy), heat, and stress. Certain illnesses can also cause a rash.  An allergic reaction to something that touches your skin, such as latex, nickel, or poison ivy, is called contact dermatitis. Contact dermatitis may also be caused by something that irritates the skin, such as bleach, a chemical, or soap. These types of rashes cannot be spread from person to person.  How long your rash will last depends on what caused it. Rashes may last a few days or months.  Follow-up care is a key part of your treatment and safety. Be sure to make and go to all appointments, and call your doctor if you are having problems. It's also a good idea to know your test results and keep a list of the medicines you take.  How can you care for yourself at home?  Do not scratch the rash. Cut your nails short, and file them smooth. Or wear gloves if this helps keep you from scratching.  Wash the area with water only. Pat dry.  Put cold, wet cloths on the rash to reduce itching.  Keep cool, and stay out of the sun.  Leave the rash open to the air as much as possible.  If the rash itches, use hydrocortisone cream. Follow the directions on the label. Calamine lotion may help for plant rashes.  If itching affects your sleep, ask your doctor if you can take an antihistamine that might reduce itching and make you sleepy, such as diphenhydramine (Benadryl). Be safe with medicines. Read and follow all instructions on the label.  If your doctor prescribed a cream, use it as directed. If your doctor prescribed medicine, take it exactly as directed.  When should you call for help?   Call your doctor now or seek immediate medical care if:    You have symptoms of infection, such  as:  Increased pain, swelling, warmth, or redness.  Red streaks leading from the area.  Pus draining from the area.  A fever.     You have joint pain along with the rash.   Watch closely for changes in your health, and be sure to contact your doctor if:    Your rash is changing or getting worse.     You are not getting better as expected.   Where can you learn more?  Go to [email or web address] and enter F270 to learn more about Dermatitis: Care Instructions.  Current as of: February 26, 2023  Content Version: 14.6   2024-2025 [place], [place].   Care instructions adapted under license by [facility]. If you have questions about a medical condition or this instruction, always ask your healthcare professional. Romayne Alderman, [facility], disclaims any warranty or liability for your use of this information.    "

## 2024-03-28 NOTE — Care Coordination (Addendum)
"  Chart reviewed for discharge planning. Pt known to CM from previous care. Readmission assessment completed.  Pt to discharge this afternoon with family support. Family to provide transportation. No therapy needs. CM spoke with pt and spouse at the bedside to complete assessment. Demographics verified. Insurance/PCP verified. Pt has perscription drug coverage. Pt lives with spouse and is able to manage ADL's independently. Pt is retired and is a solicitor. Pt uses a cane to assist with ambulation. No other CM needs noted.      03/28/24 1100   Service Assessment   Information Provided By Patient   Patient Orientation Alert;Oriented;Person;Place;Situation   Cognition No Apparent Deficit   Primary Caregiver Self   Support Systems Spouse/Significant Other   Patient's Healthcare Decision Maker is: Legal Next of Kin   PCP Verified by CM Yes   Last Visit to PCP Within last 3 months   Prior Functional Level Independent in ADLs/IADLs   History of falls? 0   Current Functional Level at Time of Initial Assessment Independent in ADLs/IADLs   Can patient return to prior living arrangement Yes   Ability to make needs known: Good   Family able to assist with home care needs: Yes   Other than yourself, who should be included in your transition plan for discharge from the hospital? not unless indicated or neccessary   Receives Help From [facility]   Current [facility] None   Social/Functional History   Prior Level of Assist for ADLs Independent   Prior Level of Assist for [employer] Insurance Yes   Prior Level of Assist for Transfers Independent   Ambulation Assistance Independent   Active Driver Yes   Mode of Engineer, Water   Occupation Retired   Surveyor, Minerals Prior to Acute Admission House   Lives With Spouse   Current Services Prior To Admission None   Potential Assistance Needed N/A   Potential Assistance Obtaining Medications No   Patient expects to be discharged to:  Home   Home Layout One level   Entrance Stairs - Number of Steps 0   Services At/After Discharge   [employer] Information Provided? No   Who will provide transportation at discharge? Family   Condition of Participation: Discharge Planning   The Plan for Transition of Care is related to the following treatment goals: plan is for pt to discharge home with family support   The Patient and/Or Patient Representative agree with the Discharge Plan? Yes       "

## 2024-03-28 NOTE — Progress Notes (Addendum)
 "                       [facility] PROGRESS NOTE           03/28/2024 8:27 AM    Admit Date: 03/27/2024    Admit Diagnosis: Wound infection [T14.8XXA, L08.9]  Pacemaker infection, initial encounter [T82.7XXA]      Subjective:   No complaints this AM, no chest pain or shortness of breath    Interval History: (History of pertinent interval events obtained from nursing staff)    ROS:  GEN:  No fever or chills  Cardiovascular:  As noted above  Pulmonary:  As noted above  Neuro:  No new focal motor or sensory loss      Objective:     Vitals:    03/27/24 2102 03/28/24 0058 03/28/24 0416 03/28/24 0805   BP: (!) 166/79 (!) 153/80 139/70 (!) 156/85   Pulse: 73 77 63 70   Resp: 17 16 16     Temp: 98.6 F (37 C) 97.9 F (36.6 C) 99 F (37.2 C) 100.4 F (38 C)   TempSrc: Oral Oral Oral    SpO2: 97% 94% 94% 92%   Weight:   130.9 kg (288 lb 8 oz)        Physical Exam:  General-Well Developed, Well Nourished, No Acute Distress, Alert & Oriented x 3, appropriate mood.  Neck- supple, no JVD  CV- regular rate and rhythm no MRG  Lung- clear bilaterally  Abd- soft, nontender, nondistended  Ext- no edema bilaterally.  Skin- warm and dry    Current Facility-Administered Medications   Medication Dose Route Frequency    allopurinol  (ZYLOPRIM ) tablet 300 mg  300 mg Oral Daily    apixaban  (ELIQUIS ) tablet 5 mg  5 mg Oral BID    carvedilol  (COREG ) tablet 12.5 mg  12.5 mg Oral BID WC    gabapentin  (NEURONTIN ) capsule 600 mg  600 mg Oral 4x daily    sodium chloride  flush 0.9 % injection 5-40 mL  5-40 mL IntraVENous 2 times per day    sodium chloride  flush 0.9 % injection 5-40 mL  5-40 mL IntraVENous PRN    0.9 % sodium chloride  infusion   IntraVENous PRN    potassium chloride  (KLOR-CON  M) extended release tablet 40 mEq  40 mEq Oral PRN    Or    potassium bicarb-citric acid  (EFFER-K) effervescent tablet 40 mEq  40 mEq Oral PRN    Or    potassium chloride  10 mEq/100 mL IVPB (Peripheral Line)  10 mEq IntraVENous PRN    magnesium  sulfate 2000  mg in 50 mL IVPB premix  2,000 mg IntraVENous PRN    ondansetron  (ZOFRAN -ODT) disintegrating tablet 4 mg  4 mg Oral Q8H PRN    Or    ondansetron  (ZOFRAN ) injection 4 mg  4 mg IntraVENous Q6H PRN    acetaminophen  (TYLENOL ) tablet 650 mg  650 mg Oral Q6H PRN    Or    acetaminophen  (TYLENOL ) suppository 650 mg  650 mg Rectal Q6H PRN    polyethylene glycol (GLYCOLAX ) packet 17 g  17 g Oral Daily PRN    vancomycin  (VANCOCIN ) 1500 mg in sodium chloride  0.9 % 250 mL IVPB  1,500 mg IntraVENous Q18H     Data Review:   Recent Results (from the past 24 hours)   EKG 12 Lead (Chest Pain)    Collection Time: 03/27/24 12:39 PM   Result Value Ref Range  Ventricular Rate 70 BPM    Atrial Rate 70 BPM    P-R Interval 160 ms    QRS Duration 166 ms    Q-T Interval 496 ms    QTc Calculation (Bazett) 535 ms    P Axis 63 degrees    R Axis 246 degrees    T Axis 52 degrees    Diagnosis       Atrial-sensed ventricular-paced rhythm with frequent Premature ventricular   complexes  Abnormal ECG  When compared with ECG of 19-Mar-2024 18:24,  Premature ventricular complexes are now Present  Vent. rate has increased by   7 bpm  Confirmed by MATHIAS, MD ([facility]), SHAWN [phone]) on 03/27/2024 7:43:45 PM     CBC with Auto Differential    Collection Time: 03/27/24 12:43 PM   Result Value Ref Range    WBC 8.4 4.3 - 11.1 K/uL    RBC 5.39 4.23 - 5.6 M/uL    Hemoglobin 17.7 (H) 13.6 - 17.2 g/dL    Hematocrit 49.4 (H) 41.1 - 50.3 %    MCV 93.7 82 - 102 FL    MCH 32.8 26.1 - 32.9 PG    MCHC 35.0 31.4 - 35.0 g/dL    RDW 85.4 88.0 - 85.3 %    Platelets 107 (L) 150 - 450 K/uL    MPV 11.9 8.8 - 12.5 FL    nRBC 0.00 0.0 - 0.2 K/uL    Differential Type AUTOMATED      Neutrophils % 59.6 43.0 - 78.0 %    Lymphocytes % 20.4 13.0 - 44.0 %    Monocytes % 7.9 4.0 - 12.0 %    Eosinophils % 10.8 (H) 0.5 - 7.8 %    Basophils % 0.8 0.0 - 2.0 %    Immature Granulocytes % 0.5 0.0 - 5.0 %    Neutrophils Absolute 5.02 1.70 - 8.20 K/UL    Lymphocytes Absolute 1.72 0.50 - 4.60 K/UL     Monocytes Absolute 0.67 0.10 - 1.30 K/UL    Eosinophils Absolute 0.91 (H) 0.00 - 0.80 K/UL    Basophils Absolute 0.07 0.00 - 0.20 K/UL    Immature Granulocytes Absolute 0.04 0.0 - 0.5 K/UL   CMP    Collection Time: 03/27/24 12:43 PM   Result Value Ref Range    Sodium 137 136 - 145 mmol/L    Potassium 4.7 3.5 - 5.1 mmol/L    Chloride 101 98 - 107 mmol/L    CO2 27 20 - 29 mmol/L    Anion Gap 9 7 - 16 mmol/L    Glucose 112 (H) 70 - 99 mg/dL    BUN 23 8 - 23 MG/DL    Creatinine 8.62 (H) 0.80 - 1.30 MG/DL    Est, Glom Filt Rate 56 (L) >60 ml/min/1.69m2    Calcium 9.5 8.8 - 10.2 MG/DL    Total Bilirubin 1.3 (H) 0.0 - 1.2 MG/DL    ALT 32 8 - 55 U/L    AST 54 (H) 15 - 37 U/L    Alk Phosphatase 67 40 - 129 U/L    Total Protein 7.5 6.3 - 8.2 g/dL    Albumin 3.5 3.2 - 4.6 g/dL    Globulin 4.1 (H) 2.3 - 3.5 g/dL    Albumin/Globulin Ratio 0.9 (L) 1.0 - 1.9     Sedimentation Rate    Collection Time: 03/27/24 12:43 PM   Result Value Ref Range    Sed Rate, Automated 4 (L) 15 mm/hr  High sensitivity CRP    Collection Time: 03/27/24 12:43 PM   Result Value Ref Range    CRP, High Sensitivity 10.9 (H) 0.0 - 3.0 mg/L   Procalcitonin    Collection Time: 03/27/24 12:43 PM   Result Value Ref Range    Procalcitonin 0.07 0.00 - 0.10 ng/mL   Culture, Blood 1    Collection Time: 03/27/24  6:28 PM    Specimen: Blood   Result Value Ref Range    Special Requests LEFT  Antecubital        Culture NO GROWTH AFTER 12 HOURS     Culture, Blood 2    Collection Time: 03/27/24  7:36 PM    Specimen: Blood   Result Value Ref Range    Special Requests LEFT  Antecubital        Culture NO GROWTH AFTER 11 HOURS     Basic Metabolic Panel w/ Reflex to MG    Collection Time: 03/28/24  5:16 AM   Result Value Ref Range    Sodium 136 136 - 145 mmol/L    Potassium 4.3 3.5 - 5.1 mmol/L    Chloride 103 98 - 107 mmol/L    CO2 23 20 - 29 mmol/L    Anion Gap 9 7 - 16 mmol/L    Glucose 130 (H) 70 - 99 mg/dL    BUN 22 8 - 23 MG/DL    Creatinine 8.93 9.19 - 1.30 MG/DL     Est, Glom Filt Rate 76 >60 ml/min/1.56m2    Calcium 8.6 (L) 8.8 - 10.2 MG/DL   CBC    Collection Time: 03/28/24  5:16 AM   Result Value Ref Range    WBC 9.5 4.3 - 11.1 K/uL    RBC 5.08 4.23 - 5.6 M/uL    Hemoglobin 17.0 13.6 - 17.2 g/dL    Hematocrit 52.8 58.8 - 50.3 %    MCV 92.7 82 - 102 FL    MCH 33.5 (H) 26.1 - 32.9 PG    MCHC 36.1 (H) 31.4 - 35.0 g/dL    RDW 85.6 88.0 - 85.3 %    Platelets 101 (L) 150 - 450 K/uL    MPV 12.1 8.8 - 12.5 FL    nRBC 0.00 0.0 - 0.2 K/uL       EKG:  (EKG has been independently visualized by me with interpretation below)  Assessment:     Principal Problem:    Infected pacemaker, initial encounter  Active Problems:    Pacemaker    History of DVT (deep vein thrombosis)    Hypertension    Bilateral lower extremity edema    Wound infection  Resolved Problems:    * No resolved hospital problems. *    Plan:   PM site drainage: evaluation of the wound most consistent with an allergic reaction to dermabond, not systemically ill, plan for D/C today, close device clinic follow up, will empirically treat with doxy for 7 days    Donnice B. Ali Mohl MD  Cardiology/Electrophysiology   "

## 2024-03-28 NOTE — Progress Notes (Signed)
"  Discharge instructions reviewed with patient and spouse. Prescriptions given for doxy and med info sheets provided for all new medications. Opportunity for questions provided. Patient and spouse voiced understanding of all discharge instructions.     IV and tele box removed.    Dressing supplies provided per cardiology team  "

## 2024-03-29 ENCOUNTER — Ambulatory Visit: Admit: 2024-03-29 | Discharge: 2024-03-29 | Payer: MEDICARE | Primary: Family Medicine

## 2024-03-29 NOTE — Telephone Encounter (Signed)
"  Please make patient a hospital followup appointment within 7-14 days.  "

## 2024-03-29 NOTE — Progress Notes (Signed)
"  Patient seen to reassess left pocket. Left pocket w/ dressing removed w/ scant serous sanguinous fluid noted. Pocket w/ redness and excoriation noted. Patient seen in ER and felt related to Aquacell dressing. Patient w/ no active fevers. No current s/s of infection or erosion. Site redressed w/ non adhesive dressing. Patient started doxycyline 100 mg BID. Has f/u appt scheduled for Friday.     Deitra Bring, RN   "

## 2024-03-29 NOTE — Care Coordination (Signed)
 "Care Transitions Note    Initial Call - Call within 2 business days of discharge: Yes, spoke with patient or caregiver.    Patient Current Location:  Home: [address] [place] [address]    Care Transition Nurse contacted the patient by telephone to perform post hospital discharge assessment, verified name and DOB as identifiers.  Provided introduction to self, and explanation of the Care Transition Nurse role.    Patient: Chael Urenda [facility]    Patient DOB: 01/24/1956   MRN: [id number]    Reason for Admission: Infected pacemaker, initial encounter   Discharge Date: 03/28/24  RURS: Readmission Risk Score: 10.9      Last Discharge Facility       Date Complaint Diagnosis Description Type Department Provider    03/27/24 Pacemaker Problem Wound infection ED to Hosp-Admission (Discharged) (ADMITTED) [id number] Jannetta Elouise POUR, MD; [place], Ku...            Was this an external facility discharge? No    Additional needs identified to be addressed with provider   No needs identified             Method of communication with provider: none.    Patients top risk factors for readmission: medical condition-mildly persistent AF, heart block, chronic HFpEF, atrial fibrillation status post ablation, TAA, CAD status post CABG in 2015, statin intolerance, hypertension, hyperlipidemia, thrombocytopenia, chronic venous insufficiency, DVT     Interventions to address risk factors:   Review of patient management of conditions/medications: verbalized understanding.    Care Summary Note: patient states that he is doing well, everything was covered during inpatient as well as discharge.    Care Transition Nurse reviewed discharge instructions, medical action plan, and red flags with patient. The patient was given an opportunity to ask questions; all questions answered at this time.. The patient verbalized understanding.   Were discharge instructions available to patient? Yes.   Reviewed appropriate site of care based on symptoms and resources  available to patient including: PCP  Specialist  Urgent care clinics  When to call 911. The patient agrees to contact the primary care provider and/or specialist office for questions related to their healthcare.      Advance Care Planning:   Does patient have an Advance Directive:   Primary Decision Maker: Lorenzo, Arscott - Spouse - [phone]    Secondary Decision Maker: Leonce Alken - Child [phone] .    Medication Review:  Medication review was performed with patient,1111F entered: yes.     Assessments:  Care Transitions 24 Hour Call    Do you have a copy of your discharge instructions?: Yes  Do you have all of your prescriptions and are they filled?: Yes  Have you been contacted by a [facility] Pharmacist?: No  Have you scheduled your follow up appointment?: Yes  How are you going to get to your appointment?: Car - family or friend to transport  Do you feel like you have everything you need to keep you well at home?: Yes  Care Transitions Interventions          Follow Up Appointment:   Discussed follow up appointments. Patient has hospital follow up appointment scheduled within 7 days of discharge.   Future Appointments         Provider Specialty Dept Phone    03/29/2024 11:00 AM [facility] Cardiac Electrophysiology [phone]    04/02/2024 10:30 AM [facility] Cardiac Electrophysiology [phone]    04/08/2024 2:15 PM Dowdy,  Arthea HERO, MD Cardiology [phone]    04/21/2024 8:30 AM [facility] Family Medicine [phone]    04/26/2024 8:30 AM [facility] Lab [phone]    04/26/2024 9:30 AM Nalani Craze, MD Oncology [phone]    05/03/2024 9:15 AM Clair Ozell Meth, MD Family Medicine [phone]    05/13/2024 8:15 AM Clair Ozell Meth, MD Family Medicine [phone]    08/18/2024 9:45 AM Lajoyce Arthea HERO, MD Cardiology [phone]    09/28/2024 10:00 AM (Arrive by 9:45 AM) Dann Hoar, APRN - CNP Sleep Medicine [phone]    01/26/2025 10:00 AM Aline Kast, MD Neurology [phone]            Care Transition Nurse provided contact information.  No further follow-up call indicated based on severity of symptoms and risk factors.  Plan for next call: N/A     Farrel FORBES Fischer, RN    "

## 2024-03-30 NOTE — Telephone Encounter (Signed)
 Information reviewed by Powell Solomons, PA who recommends patient hold eliquis  for 2 days.     Patient given instructions and verbalized understanding.   He will call back sooner with any concerns.

## 2024-03-30 NOTE — Telephone Encounter (Signed)
 Pictures reviewed w/ stable hematoma. See triage call

## 2024-03-30 NOTE — Telephone Encounter (Signed)
 Patient called clinic this morning with concerns that within the last 24 hours of being seen he has had worsening swelling around device. Denies any pain or fever. Minimal drainage.     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  Sees vascular.     He will send a mychart picture of area.     CRT-P implanted by Dr. Carlon on 03/19/24. LV lead currently off for possible non capture. Plans to reassess device on 04/02/24    Please advise on recommendations ( okay to hold eliquis ?).

## 2024-04-01 LAB — CULTURE, BLOOD 2: Culture: NO GROWTH

## 2024-04-01 LAB — CULTURE, BLOOD 1: Culture: NO GROWTH

## 2024-04-02 ENCOUNTER — Ambulatory Visit: Admit: 2024-04-02 | Discharge: 2024-04-02 | Payer: MEDICARE | Primary: Family Medicine

## 2024-04-02 DIAGNOSIS — I5032 Chronic diastolic (congestive) heart failure: Principal | ICD-10-CM

## 2024-04-05 NOTE — Telephone Encounter (Signed)
 Patient called stating he picked glue off his incision Friday and it started draining dark blood. The drainage stopped, and he applied a steri strip. He completed doxy on Saturday.     He denies any additional drainage, no fever, swelling the same.     Instructed patient to avoid picking anything at the incision and avoid touching the area at all. Keep area clean.  If any new drainage begins, call back sooner. Will assess at office at visit with Dr. Lajoyce.     Requested call back with any new drainage, worsening swelling, or fevers prior to appt. All questions and concerns answered.

## 2024-04-06 ENCOUNTER — Ambulatory Visit: Admit: 2024-04-06 | Discharge: 2024-04-06 | Payer: MEDICARE | Primary: Family Medicine

## 2024-04-06 MED ORDER — CEPHALEXIN 500 MG PO CAPS
500 | ORAL_CAPSULE | Freq: Three times a day (TID) | ORAL | 0 refills | Status: AC
Start: 2024-04-06 — End: ?

## 2024-04-06 NOTE — Progress Notes (Signed)
 Patient seen to recheck left pocket. Left pocket w/ moderate soft hematoma. Increased from last week. Incisional line is slightly unapproximated w/ minimal bloody drainage noted. No s/s of infection or erosion. Patient denies fevers or chills. Site was cleaned and steri strips were applied and covered w/ sterile dressing. Site issue discussed w/ Powell Solomons, PA and Keflex  500mg  TID x 7 days was ordered due to bleeding. Patient will hold his eliquis . Hx of DVT. Appointment scheduled next week. Patient will call w/ further site issues.     Deitra Bring, RN

## 2024-04-08 ENCOUNTER — Encounter: Payer: MEDICARE | Primary: Family Medicine

## 2024-04-08 ENCOUNTER — Ambulatory Visit
Admit: 2024-04-08 | Discharge: 2024-04-08 | Payer: MEDICARE | Attending: Cardiovascular Disease | Primary: Family Medicine

## 2024-04-08 NOTE — Progress Notes (Addendum)
 "[facility] Follow Up                 Reason for Visit: Posthospitalization follow-up    Subjective:     Patient is a 69 y.o. male with a PMH of chronic HFpEF, atrial fibrillation status post ablation, status post CRT-P, dilated ascending aorta, CAD status post CABG in 2015, statin intolerance, hypertension, hyperlipidemia, thrombocytopenia, chronic venous insufficiency and DVT who presents for follow-up.  The patient was last seen in November 2025.  Toprol -XL was increased to 50 mg daily.    The patient was hospitalized on 12/26 for recurrent severely clinically important bradycardia requiring implantation of a CRT-P (LV lead not capturing). He was readmitted a few days later after presenting with drainage from the PPM site, raising concern for infection; blood cultures were negative, labs were unremarkable, and he received two doses of IV vancomycin . Evaluation on 1/4 suggested contact dermatitis from Dermabond rather than infection, and he was discharged home in stable condition with doxycycline .  The patient had a TTE in May 2025 that was noted to demonstrate a normal EF.  The patient reports peripheral edema though not lifestyle limiting.  He denies angina.  He reports DOE that he attributes in part to deconditioning and obesity.  The patient is more sedentary using a cane for ambulatory assistance.  He is holding his Eliquis  until device check next week in the setting of hematoma.      Past Medical History:   Diagnosis Date    CAD (coronary artery disease) 2015    Cataract 2017    Cervical disc disorder     Colon polyp 2007    Concussion 1968    Gout 03/2015    Hypertension     controlled with meds    Low back pain Jan 2002    Lumbosacral disc disease March 2003    Neuromuscular disorder [facility]) 2019    Neuropathy May 2019    Osteoarthritis 03/2013    Plantar fasciitis 1995    Skin cancer 2016    Sleep apnea     cpap at night    Spinal stenosis 03/2001    Tendinitis 2015      Past Surgical History:    Procedure Laterality Date    BACK SURGERY  2021    has had 3 different ones last was in 2021    CARDIAC CATHETERIZATION  2016    CARDIAC SURGERY      CABG    CAROTID STENT PLACEMENT  2015    CARPAL TUNNEL RELEASE  Aug 2019 & Dec 2019    CHOLECYSTECTOMY      COLONOSCOPY N/A 11/29/2020    COLONOSCOPY POLYPECTOMY HOT SNARE performed by Randalyn Romans, MD at [facility] ENDOSCOPY    COLOSTOMY  2007    EP DEVICE PROCEDURE N/A 05/22/2023    Ablation A-fib w complete ep study performed by Carlon Reyes PARAS, MD at [facility] CARDIAC CATH LAB    EP DEVICE PROCEDURE N/A 03/19/2024    Insert PPM dual performed by Carlon Reyes PARAS, MD at [facility] CARDIAC CATH LAB    EYE SURGERY Left     cataract    FRACTURE SURGERY Left     tib-fib fracture    JOINT REPLACEMENT  Hip - 2018    KNEE ARTHROSCOPY Right     LUMBAR FUSION  Dec 2021    LUMBAR LAMINECTOMY  March 2003    SHOULDER SURGERY      SPINAL FUSION  2020  TOTAL HIP ARTHROPLASTY Left     VASECTOMY        Family History   Problem Relation Age of Onset    Cancer Mother         Lymp    Stroke Father     Heart Surgery Father     Diabetes Father     Heart Disease Father     Cancer Sister         Breast    Clotting Disorder Maternal Grandfather       Social History     Tobacco Use    Smoking status: Never    Smokeless tobacco: Never   Substance Use Topics    Alcohol use: Not Currently     Comment: socially      Allergies   Allergen Reactions    Hydrocodone -Acetaminophen  Anxiety and Itching    Losartan Shortness Of Breath    Amlodipine Other (See Comments)     Other reaction(s): Other (See Comments)  Fatigue   Fatigue       Rosuvastatin Other (See Comments)     Other reaction(s): Constipation  Fatigue, constipation  Other reaction(s): Fatigue    Dermabond Dermatitis     Gets a  fluid filled pockets and a culture was sent 03/27/2024.    Atorvastatin Diarrhea    Ezetimibe      Other reaction(s): Constipation, Other (See Comments)  Constipation and decreased urine output  Constipation and decreased urine  output  Other reaction(s): Constipation, Urine output low           ROS:  No obvious pertinent positives on review of systems except for what was outlined above.       Objective:       BP 136/80   Pulse 68   Ht 1.854 m (6' 1)   Wt 134.3 kg (296 lb)   BMI 39.05 kg/m     BP Readings from Last 3 Encounters:   04/08/24 136/80   03/28/24 138/74   03/20/24 (!) 185/80       Wt Readings from Last 3 Encounters:   04/08/24 134.3 kg (296 lb)   03/28/24 130.9 kg (288 lb 8 oz)   03/20/24 134.5 kg (296 lb 8 oz)       General/Constitutional:   Alert and oriented x 3, no acute distress  HEENT:   normocephalic, atraumatic, moist mucous membranes  Neck:   No JVD or carotid bruits bilaterally  Cardiovascular:   regular rate and rhythm, no rub/gallop appreciated  Pulmonary:   clear to auscultation bilaterally, no respiratory distress  Abdomen:   soft, non-tender, non-distended  Ext:   No sig LE edema bilaterally  Skin:  warm and dry, no obvious rashes seen  Neuro:   no obvious sensory or motor deficits  Psychiatric:   normal mood and affect    Data Review:   Lab Results   Component Value Date    CHOL 151 12/29/2023    CHOL 170 08/08/2023    CHOL 161 06/10/2023     Lab Results   Component Value Date    TRIG 138 12/29/2023    TRIG 111 08/08/2023    TRIG 154 (H) 06/10/2023     Lab Results   Component Value Date    HDL 41 12/29/2023    HDL 53 08/08/2023    HDL 46 03/11/2023     No components found for: LDLCHOLESTEROL, LDLCALC  Lab Results   Component Value Date    VLDL 28 (  H) 12/29/2023    VLDL 22 08/08/2023    VLDL 16 03/11/2023     Lab Results   Component Value Date    CHOLHDLRATIO 3.7 12/29/2023    CHOLHDLRATIO 3.2 08/08/2023    CHOLHDLRATIO 3.1 03/11/2023        Lab Results   Component Value Date/Time    NA 136 03/28/2024 05:16 AM    NA 137 03/27/2024 12:43 PM    NA 138 03/23/2024 02:15 PM    K 4.3 03/28/2024 05:16 AM    K 4.7 03/27/2024 12:43 PM    K 5.0 03/23/2024 02:15 PM    CL 103 03/28/2024 05:16 AM    CL 101 03/27/2024  12:43 PM    CL 102 03/23/2024 02:15 PM    CO2 23 03/28/2024 05:16 AM    CO2 27 03/27/2024 12:43 PM    CO2 28 03/23/2024 02:15 PM    BUN 22 03/28/2024 05:16 AM    BUN 23 03/27/2024 12:43 PM    BUN 24 03/23/2024 02:15 PM    CREATININE 1.06 03/28/2024 05:16 AM    CREATININE 1.37 03/27/2024 12:43 PM    CREATININE 1.22 03/23/2024 02:15 PM    GLUCOSE 130 03/28/2024 05:16 AM    GLUCOSE 112 03/27/2024 12:43 PM    GLUCOSE 105 03/23/2024 02:15 PM    GLUCOSE 109 06/10/2023 09:04 AM    GLUCOSE 127 11/21/2021 09:42 AM    CALCIUM 8.6 03/28/2024 05:16 AM    CALCIUM 9.5 03/27/2024 12:43 PM    CALCIUM 9.5 03/23/2024 02:15 PM         Lab Results   Component Value Date    ALT 32 03/27/2024    ALT 34 03/23/2024    ALT 45 02/05/2024    AST 54 (H) 03/27/2024    AST 46 (H) 03/23/2024    AST 47 (H) 02/05/2024        Assessment/Plan:   1. Chronic heart failure with preserved ejection fraction (HFpEF) (HCC)  - H2FPEF score at least 6 diagnostic of HFpEF (heavy, atrial fibrillation, elder)  - Resume PO Lasix  as needed  - Instructed the patient to take a dose of Lasix  for weight gain of 2 lbs in two days or 5 lbs in one week and return to as needed dosing once the weight returns to baseline    2. History of cardiac ablation for atrial fibrillation (HCC)  - CHA2DS2-VASc equals 4   - Hematology note reviewed in the setting of thrombocytopenia: Continue lifelong anticoagulation with Eliquis  as tolerated  - Continue with Coreg   - Eliquis  currently on hold in the setting of device-related hematoma with consideration for resumption of DOAC next week at wound check in device clinic    3. 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 Dilatation of thoracic  aorta  - 4.3 cm ascending noted on CTA in June 2025  - Obtain a surveillance thoracic CTA later this year    5. Hx of CABG  - In patients with AF and CCD (beyond 1 year after revascularization or CAD not requiring coronary revascularization) without history of stent thrombosis, oral anticoagulation monotherapy is recommended over the combination therapy of OAC and single APT (aspirin or P2Y12 inhibitor) to  decrease the risk of major bleeding (class I recommendation; 2023 ACC guidelines for AF)  - History of statin intolerance   - Continue with Coreg     6. Hyperlipidemia, unspecified hyperlipidemia type  - History of statin intolerance     7. Hypertension, unspecified type  - Well-controlled  - Continue with Coreg     The patient is being seen today within a 90-day global period; however, the E/M service was solely for the treatment of an underlying condition and not for postoperative care.    F/U: May 2026    Arthea CHRISTELLA Lingo, MD  "

## 2024-04-16 ENCOUNTER — Ambulatory Visit: Admit: 2024-04-16 | Discharge: 2024-04-16 | Payer: MEDICARE | Primary: Family Medicine

## 2024-04-16 DIAGNOSIS — I5032 Chronic diastolic (congestive) heart failure: Principal | ICD-10-CM

## 2024-04-21 ENCOUNTER — Encounter: Payer: MEDICARE | Primary: Family Medicine

## 2024-04-22 ENCOUNTER — Inpatient Hospital Stay: Payer: MEDICARE | Primary: Family Medicine

## 2024-04-22 ENCOUNTER — Other Ambulatory Visit: Admit: 2024-04-22 | Discharge: 2024-04-22 | Payer: MEDICARE | Primary: Family Medicine

## 2024-04-22 DIAGNOSIS — R7303 Prediabetes: Principal | ICD-10-CM

## 2024-04-22 LAB — COMPREHENSIVE METABOLIC PANEL
ALT: 39 U/L (ref 8–55)
AST: 48 U/L — ABNORMAL HIGH (ref 15–37)
Albumin/Globulin Ratio: 1 (ref 1.0–1.9)
Albumin: 3.8 g/dL (ref 3.2–4.6)
Alk Phosphatase: 64 U/L (ref 40–129)
Anion Gap: 9 mmol/L (ref 7–16)
BUN: 24 mg/dL — ABNORMAL HIGH (ref 8–23)
CO2: 29 mmol/L (ref 20–29)
Calcium: 9.6 mg/dL (ref 8.8–10.2)
Chloride: 101 mmol/L (ref 98–107)
Creatinine: 1.3 mg/dL (ref 0.80–1.30)
Est, Glom Filt Rate: 60 mL/min/{1.73_m2} — ABNORMAL LOW (ref >60)
Globulin: 3.7 g/dL — ABNORMAL HIGH (ref 2.3–3.5)
Glucose: 120 mg/dL — ABNORMAL HIGH (ref 70–99)
Potassium: 4.7 mmol/L (ref 3.5–5.1)
Sodium: 140 mmol/L (ref 136–145)
Total Bilirubin: 1.6 mg/dL — ABNORMAL HIGH (ref 0.0–1.2)
Total Protein: 7.5 g/dL (ref 6.3–8.2)

## 2024-04-22 LAB — HEMOGLOBIN A1C
Estimated Avg Glucose: 99 mg/dL
Hemoglobin A1C: 5.1 % (ref 0–5.6)

## 2024-04-22 LAB — LIPID PANEL
Chol/HDL Ratio: 3.1 (ref 0.0–5.0)
Cholesterol, Total: 176 mg/dL (ref 0–200)
HDL: 56 mg/dL (ref 40–60)
LDL Cholesterol: 97 mg/dL (ref 0–100)
Triglycerides: 115 mg/dL (ref 0–150)
VLDL Cholesterol Calculated: 23 mg/dL (ref 6–23)

## 2024-04-22 LAB — CBC WITH AUTO DIFFERENTIAL
Basophils %: 0.7 % (ref 0.0–2.0)
Basophils Absolute: 0.05 10*3/uL (ref 0.00–0.20)
Eosinophils %: 12.3 % — ABNORMAL HIGH (ref 0.5–7.8)
Eosinophils Absolute: 0.93 10*3/uL — ABNORMAL HIGH (ref 0.00–0.80)
Hematocrit: 51.6 % — ABNORMAL HIGH (ref 41.1–50.3)
Hemoglobin: 18.1 g/dL — ABNORMAL HIGH (ref 13.6–17.2)
Immature Granulocytes %: 0.4 % (ref 0.0–5.0)
Immature Granulocytes Absolute: 0.03 10*3/uL (ref 0.0–0.5)
Lymphocytes %: 20.4 % (ref 13.0–44.0)
Lymphocytes Absolute: 1.54 10*3/uL (ref 0.50–4.60)
MCH: 32.7 pg (ref 26.1–32.9)
MCHC: 35.1 g/dL — ABNORMAL HIGH (ref 31.4–35.0)
MCV: 93.3 FL (ref 82–102)
MPV: 13.3 FL — ABNORMAL HIGH (ref 8.8–12.5)
Monocytes %: 10.3 % (ref 4.0–12.0)
Monocytes Absolute: 0.78 10*3/uL (ref 0.10–1.30)
Neutrophils %: 55.9 % (ref 43.0–78.0)
Neutrophils Absolute: 4.21 10*3/uL (ref 1.70–8.20)
Platelets: 59 10*3/uL — AB (ref 150–450)
RBC: 5.53 M/uL (ref 4.23–5.6)
RDW: 14.9 % — ABNORMAL HIGH (ref 11.9–14.6)
WBC: 7.5 10*3/uL (ref 4.3–11.1)
nRBC: 0 10*3/uL (ref 0.0–0.2)

## 2024-04-22 LAB — TSH: TSH, 3rd Generation: 2.33 u[IU]/mL (ref 0.270–4.200)

## 2024-04-22 LAB — URIC ACID: Uric Acid: 7.2 mg/dL (ref 3.9–8.2)

## 2024-04-22 LAB — VITAMIN B12: Vitamin B-12: 1590 pg/mL — ABNORMAL HIGH (ref 193–986)

## 2024-04-22 LAB — VITAMIN D 25 HYDROXY: Vit D, 25-Hydroxy: 40.8 ng/mL (ref 30.0–100.0)

## 2024-04-26 ENCOUNTER — Encounter: Payer: MEDICARE | Primary: Family Medicine

## 2024-04-26 ENCOUNTER — Ambulatory Visit: Payer: MEDICARE | Attending: Hematology & Oncology | Primary: Family Medicine

## 2024-04-26 NOTE — Progress Notes (Unsigned)
 [facility] Hematology and Oncology: Established patient - follow up     No chief complaint on file.    History of Present Illness:  Mr. Yearick is a 69 y.o. male who presents today for FU.  The past medical history is significant for CAD, s/p CABG x2 in 2015 on Eliquis , HLD, secondary polycythemia, DVT, fatty liver, idiopathic neuropathy, colon polyps (last colonoscopy 11/2020, path returned as mixed tubulovillous adenoma), sleep apnea, significant degenerative disc disease in his back status post multiple surgeries to address.  06/07/2021 he underwent PTV/PopV recanalization with PA (Dr. Rox).   - 09/29/23 - presented for evaluation of thrombocytopenia on AC for hx of DVT, currently on Eliquis .  He reports a history of deep vein thrombosis (DVT) approximately 10 years ago, which was associated with pain, warmth, and redness. 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 He also has a history of heart disease and has undergone double bypass surgery. He is currently on Eliquis  and is eager to discontinue it.  He has been experiencing balance issues due to neuropathy for the past 3 years, which worsened over the last year. He does not experience any pain. The cause of his neuropathy is unknown. He consulted a neurologist and was dx'ed with idiopathic neuropathy. He has undergone conduction studies. He retains sensation and can feel pressure, but his fine motor skills are impaired. He has been taking a nerve supplement, which improved his foot movement. He also has foot drop. He had a leg fracture and compartment syndrome, which led to his current condition.  He was informed that he may have fatty liver. This was first mentioned to him in 2007 when he was hospitalized for leg fracture. He underwent a FibroScan in 12/2022, which revealed mild liver fibrosis. He has been under the care of Dr. Roblin, a gastroenterologist. His bilirubin  levels have been elevated since 1992. He had an ultrasound of his liver and spleen, which showed slight enlargement. He does not have a gallbladder.  On evaluation today, he denies lower extremity pain or new swelling.  No chest pain, worsening shortness of breath, bleeding or dark stools, abdominal pain, NVD.  No B symptoms.  No falls, head trauma.  He has chronic leg weakness and due to neuropathy, needs to focus to walk/prevent tripping.      SOCIAL HISTORY  The patient drinks alcohol, typically three or four drinks on Fridays and Saturdays, and occasionally on Sundays.    The patient (or guardian, if applicable) and other individuals in attendance with the patient were advised that Artificial Intelligence will be utilized during this visit to record, process the conversation to generate a clinical note, and support improvement of the AI technology. The patient (or guardian, if applicable) and other individuals in attendance at the appointment consented to the use of AI, including the recording.    10/31/23  History of Present Illness  The patient is here for a follow-up. He is currently on anticoagulation therapy with Eliquis  due to a history of deep vein thrombosis (DVT).  He has a history of secondary erythrocytosis and underwent a bone marrow biopsy in 2017. A comprehensive lab workup was conducted. His MPN mutation panel did not reveal JAK2, CALR, or MPL mutations. His hypercoagulable workup showed normal homocysteine, factor 8 activity, antithrombin activity, protein C and protein S activity and antigen, respectively. Coagulation tests were normal. The DRVVT screen was increased, but the confirmatory test  was normal. Cardiolipin levels were normal, and he did not have a prothrombin mutation. An ultrasound of the spleen and liver was also performed.  He has idiopathic neuropathy, for which conduction studies were completed.  We discussed his labs and next steps in management.      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 OVERALL, THE   FEATURES ARE MOST IN KEEPING WITH SECONDARY ERYTHROCYTOSIS.   11/29/2014.      DVT RLE 7/17  There was not enough blood to do the complete antiphospholipid testing. The lupus anticoagulant was positive which may be artifactual related to the presence of xarelto . Both factor V Leiden and the prothrombin gene mutation were absent     05/28/22: six month follow up. okay overall. recent left knee surgery. 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 seeing vascular soon.  denies chest pain, dizziness, nausea, vomiting, fever, black or bloody stools, spontaneous bleeding.  tolerating Eliquis  5 mg BID but not affordable and switching back to Xarelto .  Hgb remains mildly elevated but stable. Platetlets 64,000 which is low but stable from September/November results. to hold [facility] if/when plts <50K.  Most likely cause of thrombocytopenia is liver disease - encouraged weight loss and avoidance of liver toxic substances like Tylenol /alcohol.   11/28/22 - switched from Eliquis  to Xarelto  and has been taking the medication compliantly at 20 mg p.o. daily.  In last few weeks, noted increased bruising but denies spontaneous bleeding.  denies any pain in legs.  evaluated by Dr. Rox in March.   Venous ultrasound RLE noted occluded femoral vein, it was open a year ago.  Due to chronically occluded right popliteal vein, access to the occluded femoral vein would be challenging.  Any intervention has therefore been deferred until a point when he develops worsening symptoms (swelling, pain, discoloration).  His platelet count has been trending down slowly and is 58,000.  Due to increased bruising, reduced dose of Xarelto  to 10 mg daily.  dose may need to be increased back to 20 mg daily, should he develop any acute thrombotic event.  Likely etiology for thrombocytopenia is liver disease/splenomegaly.  Patient reports a history of binge drinking until his 29s.  Cautioned  against alcohol consumption.  Advised avoidance of acetaminophen .  Rec ultrasound liver/spleen for further evaluation and likely refer him back to GI.    01/30/23 - underwent right shoulder revision arthroplasty and developed joint space infection. He received i.v. antibiotics and was on long term oral abx.    03/2023 FU with Dr Hildegard     09/29/23 first time seeing pt for evaluation   10/31/23 VV FU - discussed results of hypercoag workup and next steps     Family History   Problem Relation Age of Onset    Cancer Mother  Lymp    Stroke Father     Heart Surgery Father     Diabetes Father     Heart Disease Father     Cancer Sister         Breast    Clotting Disorder Maternal Grandfather       Social History     Socioeconomic History    Marital status: Married   Tobacco Use    Smoking status: Never    Smokeless tobacco: Never   Vaping Use    Vaping status: Never Used   Substance and Sexual Activity    Alcohol use: Not Currently     Comment: socially    Drug use: Never    Sexual activity: Not Currently     Partners: Female     Social Drivers of Health     Financial Resource Strain: Low Risk (04/09/2022)    Received from [facility]    Overall Financial Resource Strain (CARDIA)     Difficulty of Paying Living Expenses: Not hard at all   Food  Insecurity: No Food Insecurity (03/27/2024)    Hunger Vital Sign     Worried About Running Out of Food in the Last Year: Never true     Ran Out of Food in the Last Year: Never true   Transportation Needs: No Transportation Needs (03/27/2024)    PRAPARE - Therapist, Art (Medical): No     Lack of Transportation (Non-Medical): No   Physical Activity: Insufficiently Active (08/05/2023)    Exercise Vital Sign     Days of Exercise per Week: 3 days     Minutes of Exercise per Session: 30 min   Stress: No Stress Concern Present (04/09/2022)    Received from [facility] - Occupational Stress Questionnaire     Feeling of Stress : Not at all   Social Connections: Socially Integrated (04/09/2022)    Received from [facility]    Social Network     How would you rate your social network (family, work, friends)?: Good participation with social networks   Intimate Partner Violence: Not At Risk (04/09/2022)    Received from [facility]    HITS     Over the last 12 months how often did your partner physically hurt you?: Never     Over the last 12 months how often did your partner insult you or talk down to you?: Never     Over the last 12 months how often did your partner threaten you with physical harm?: Never     Over the last 12 months how often did your partner scream or curse at you?: Never   Housing Stability: Low Risk  (03/27/2024)    Housing Stability Vital Sign     Unable to Pay for Housing in the Last Year: No     Number of Times Moved in the Last Year: 0     Homeless in the Last Year: No      Review of Systems   Constitutional:  Positive for fatigue. Negative for appetite change, diaphoresis, fever and unexpected weight change.   HENT:   Negative for sore throat.    Eyes:  Negative for eye problems and icterus.   Respiratory:  Negative for cough, hemoptysis and shortness of breath.    Cardiovascular:  Negative for chest pain, leg swelling and palpitations.    Gastrointestinal:  Negative for abdominal distention, abdominal pain,  blood in stool, constipation, diarrhea, nausea and vomiting.   Endocrine: Negative for hot flashes.   Genitourinary:  Negative for dysuria.    Musculoskeletal:  Positive for arthralgias, gait problem and myalgias. Negative for back pain.   Skin:  Negative for itching, rash and wound.   Neurological:  Positive for gait problem and numbness. Negative for dizziness, extremity weakness, headaches, light-headedness, seizures and speech difficulty.   Hematological:  Negative for adenopathy. Does not bruise/bleed easily.   Psychiatric/Behavioral:  Positive for sleep disturbance. Negative for decreased concentration and depression. The patient is not nervous/anxious.       Allergies   Allergen Reactions    Hydrocodone -Acetaminophen  Anxiety and Itching    Losartan Shortness Of Breath    Amlodipine Other (See Comments)     Other reaction(s): Other (See Comments)  Fatigue   Fatigue       Rosuvastatin Other (See Comments)     Other reaction(s): Constipation  Fatigue, constipation  Other reaction(s): Fatigue    Dermabond Dermatitis     Gets a  fluid filled pockets and a culture was sent 03/27/2024.    Atorvastatin Diarrhea    Ezetimibe      Other reaction(s): Constipation, Other (See Comments)  Constipation and decreased urine output  Constipation and decreased urine output  Other reaction(s): Constipation, Urine output low       Past Medical History:   Diagnosis Date    CAD (coronary artery disease) 2015    Cataract 2017    Cervical disc disorder     Colon polyp 2007    Concussion 1968    Gout 03/2015    Hypertension     controlled with meds    Low back pain Jan 2002    Lumbosacral disc disease March 2003    Neuromuscular disorder [facility]) 2019    Neuropathy May 2019    Osteoarthritis 03/2013    Plantar fasciitis 1995    Skin cancer 2016    Sleep apnea     cpap at night    Spinal stenosis 03/2001    Tendinitis 2015     Past Surgical History:   Procedure Laterality  Date    BACK SURGERY  2021    has had 3 different ones last was in 2021    CARDIAC CATHETERIZATION  2016    CARDIAC SURGERY      CABG    CAROTID STENT PLACEMENT  2015    CARPAL TUNNEL RELEASE  Aug 2019 & Dec 2019    CHOLECYSTECTOMY      COLONOSCOPY N/A 11/29/2020    COLONOSCOPY POLYPECTOMY HOT SNARE performed by Randalyn Romans, MD at [facility] ENDOSCOPY    COLOSTOMY  2007    EP DEVICE PROCEDURE N/A 05/22/2023    Ablation A-fib w complete ep study performed by Carlon Reyes PARAS, MD at [facility] CARDIAC CATH LAB    EP DEVICE PROCEDURE N/A 03/19/2024    Insert PPM dual performed by Carlon Reyes PARAS, MD at [facility] CARDIAC CATH LAB    EYE SURGERY Left     cataract    FRACTURE SURGERY Left     tib-fib fracture    JOINT REPLACEMENT  Hip - 2018    KNEE ARTHROSCOPY Right     LUMBAR FUSION  Dec 2021    LUMBAR LAMINECTOMY  March 2003    SHOULDER SURGERY      SPINAL FUSION  2020    TOTAL HIP ARTHROPLASTY Left     VASECTOMY  Current Outpatient Medications   Medication Sig Dispense Refill    cephALEXin  (KEFLEX ) 500 MG capsule Take 1 capsule by mouth 3 times daily 21 capsule 0    zolpidem  (AMBIEN ) 5 MG tablet Take 1 tablet by mouth nightly as needed for Sleep. Max Daily Amount: 5 mg      carvedilol  (COREG ) 12.5 MG tablet Take 1 tablet by mouth 2 times daily (with meals) 60 tablet 3    gabapentin  (NEURONTIN ) 600 MG tablet Take 1 tablet by mouth in the morning, at noon, in the evening, and at bedtime. (Patient taking differently: Take 1 tablet by mouth 2 times daily.) 360 tablet 3    vitamin B-6 (PYRIDOXINE ) 50 MG tablet Take 1 tablet by mouth daily      allopurinol  (ZYLOPRIM ) 300 MG tablet TAKE 1 TABLET BY MOUTH DAILY 30 tablet 11    apixaban  (ELIQUIS ) 5 MG TABS tablet Take 1 tablet by mouth 2 times daily      azelastine (ASTEPRO) 137 MCG/SPRAY nasal spray       furosemide  (LASIX ) 40 MG tablet Take 1 tablet by mouth daily as needed (take one tablet daily for weight garin greater than 2 lbs in 24 hours or 4 lbs in 48 hours.) 30 tablet 3     Cyanocobalamin (VITAMIN B 12 PO) Take by mouth every morning      Multiple Vitamins-Minerals (THERAPEUTIC MULTIVITAMIN-MINERALS) tablet Take 1 tablet by mouth daily      vitamin D3 (CHOLECALCIFEROL ) 10 MCG (400 UNIT) TABS tablet Take 2 tablets by mouth daily      CPAP Machine MISC by Does not apply route      fexofenadine (ALLEGRA) 180 MG tablet Take 1 tablet by mouth daily       No current facility-administered medications for this visit.     OBJECTIVE:  There were no vitals taken for this visit.    ECOG PERFORMANCE STATUS - 2 - Ambulatory and capable of all selfcare but unable to carry out any work activities. Up and about more than 50% of waking hours.  Pain - /10. None/Minimal pain - not affecting QOL     Physical Exam  Constitutional:       General: He is not in acute distress.     Appearance: Normal appearance.   HENT:      Nose: No congestion.   Pulmonary:      Effort: Pulmonary effort is normal. No respiratory distress.   Psychiatric:         Behavior: Behavior normal.         Thought Content: Thought content normal.        Labs:  No results found for this or any previous visit (from the past week).    Imaging: reviewed   PATHOLOGY:           Homocysteine 6.2      Comment: (NOTE)  Homocysteine levels in patients >60 years increase 1-2  umol/L.  Reference Range:  5.0 - 15.0   Factor VIII Activity 95      Comment: (NOTE)  Reference Range:  57 - 163   Antithrombin Activity 108      Comment: (NOTE)  Direct oral anticoagulants such as rivaroxaban , apixaban  and  edoxaban will lead to spuriously elevated antithrombin  activity levels possibly masking a deficiency.  Reference Range:  7 months and older: 75 - 135   Protein C Activity (Chromogenic) 96      Comment: (NOTE)  Reference Range:  17 years  and older: 30 - 180   Protein S Antigen, Free 110      Comment: (NOTE)  Reference Range:  7 months and older: 15 - 157  This test was developed and its performance characteristics  determined by [facility]. It has not been  cleared or approved  by the Food and Drug Administration.   aPTT 26.9      Comment: (NOTE)  This test has not been validated for monitoring  unfractionated heparin  therapy.  aPTT-based therapeutic  ranges for unfractionated heparin  therapy have not been  established.  Consider ordering Heparin  anti-Xa  (unfractionated).  Reference Range:  18 years and older: 22.9 - 30.2   APTT 1:1 Normal Plasma TEST NOT PERFORMED      Comment: (NOTE)  Testing Not Indicated  This test was developed and its performance characteristics  determined by [facility]. It has not been cleared or approved  by the [place]  Food and Drug Administration.   aPTT 1:1 Saline TEST NOT PERFORMED      Comment: (NOTE)  Testing Not Indicated  This test was developed and its performance characteristics  determined by [facility]. It has not been cleared or approved  by the [place]  Food and Drug Administration.   Lac Interpretation Comment      Comment: (NOTE)  A lupus anticoagulant is not detected. All antiphospholipid  antibodies evaluated are normal. As antibody titers may  fluctuate with time, repeat testing may be indicated.  Please contact [facility] if further  clarification is needed.   Act. Prt C Resist w/FV Defic. 2.8      Comment: (NOTE)  The APCR result may be falsely increased (masking an  abnormal, low APCR result) in patients on direct Xa  inhibitor (e.g., rivaroxaban , apixaban , edoxaban) or a  direct thrombin inhibitor (e.g., dabigatran) anticoagulant  therapy due to assay interference by these drugs.  Reference Range:  2.2 - 3.5   DRVVT Screen 79.0 High       Comment: (NOTE)  Reference Range:  <= 47.0   DRVVT Confirm Seconds 58.4      DRVVT RATIO 1.2      Comment: (NOTE)  Reference Range:  0.8 - 1.2   HEXAGONAL PHOSPHOLIPID NEUTRAL [id number] 5      Comment: (NOTE)  This value is NEGATIVE.  This is a qualitative assay and is therefore reported as  positive for lupus anticoagulant or negative.  The  quantitative value is provided as an aid in  diagnosis.  Reference Range:  0 - 11   CARDIOLIPIN AB IGG,CLPN1 <10      Comment: (NOTE)  Reference Range:  Negative: <15  Indeterminate: 15 - 20  Low to medium positive: >20 - 80  High positive: >80   CARDIOLIPIN AB, IGM,CLPN2 <10      Comment: (NOTE)  Reference Range:  Negative: <13  Indeterminate: 13 - 20  Low to medium positive: >20 - 80  High positive: >80   BETA-2 GLYCOPROTEIN I, IGG 499410 <10      Comment: (NOTE)  The reference interval reflects a 3SD or 99th percentile  interval.  Reference Range:  Negative: <21   BETA-2 GLYCOPROTEIN I, IGM 499406 <10      Comment: (NOTE)  The reference interval reflects a 3SD or 99th percentile  interval.  Reference Range:  Negative: <33   BETA-2 GLYCOPROTEIN I, IGA 499408 <10      Comment: (NOTE)  The reference interval reflects a 3SD or 99th percentile  interval.  Reference Range:  Negative: <26   Factor II Gene Mutation Comment      Comment: (NOTE)  G-G (Normal-Normal)  No prothrombin G20210A mutation present.   Interpretation Comment Comment CM Comment CM Comment CM   Comment: (NOTE)  While the patient does not possess this risk factor, other  thrombotic risk factors may be detected through systematic  clinical laboratory analysis.   METHODOLOGY Comment      Comment: (NOTE)  Patient DNA was evaluated for the factor II gene mutation  at nucleotide 79789 using PCR amplification followed by  restriction analysis and gel electrophoresis.   Comments Comment      Comment: (NOTE)  Simultaneous Risks: If a patient possesses two or more  congenital or acquired thrombophilic risk factors, the risk  of thrombosis may rise to more than the sum of the risk  ratios for the individual risk factors. For instance, a  combination of the prothrombin G20210A mutation and the  factor V Leiden mutation may confer an increase in  thrombotic risk in the range of 20-30 fold.  Recommendations for Genetic Counseling: The prothrombin  gene mutation is an inherited characteristic. If the  mutation  is present, we recommend that the patient and  their family consider genetic counseling to obtain  additional information on inheritance and to identify other  family members at risk.  Testing Characteristics: Genetic testing provides  exceptionally high sensitivity and specificity. Inaccurate  results are limited to rare polymorphisms in primer binding  sites and to misidentification of specimens by collectors  or laboratory personnel. This assay detects only the  prothrombin G20210A mutation and does not detect other  genetic abnormalities.  This test was developed and its performance characteristics  determined by [facility]. It has not been cleared or approved  by the Food and Drug Administration.  References: Delores POUR, et al. Br J of Haem. 8002;01:092.  Cumming AM, et al. Br J of Haem. 8002;01:646. Del  [place] Mol.Diagn. 2001;6(3):201.  Emmerich J, et al. Kerri Fish. 7998;13:190-83.  Margaglione M, et al. [place]. [phone].  Performed At: [facility]  [address] [place], [place] [id number]  Shermon Redell MANO MD Ey:[phone]       ASSESSMENT:  {No diagnosis found. (Refresh or delete this SmartLink)}    Mr. Mccamy is here for FU     1. 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 Glucose levels are slightly elevated, likely due to recent food intake. Zinc, copper, and LDH levels are normal, indicating no inflammation or hemolysis. Hemoglobin levels satisfactory. The hypercoagulability workup showed normal results, with no signs of antiphospholipid syndrome or inherited mutations. 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 AST levels are marginally increased, possibly due to alcohol consumption. Total protein and  haptoglobin levels are normal, ruling out multiple myeloma and hemolysis. Tests for hepatitis A, B, C, and HIV negative, but he is not immune to hepatitis B.  He will discuss vaccine recs with his PCP.  Iron studies are satisfactory with an iron saturation of about 42%. 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  Microscopic examination of cells reveals healthy red cells, white cells, and platelets, although the platelet count  is low. AFP levels are lower than 1.8.   - Ultrasound showed no concerning features of the liver (aside from fatty liver infiltration), which has not grown since the last imaging and remains at 20 cm. The spleen is of normal size per radiology.  He was referred to GI  - rec he continues w Eliquis  5 mg twice daily.    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 Rox could not perform further interventions due to the occlusion; communicated with DR Rox re pt's case who rec lifelong AC due to chronic/recurrent DVT.  IR intervention was attempted in 2023 with little to no improvement in symptoms.  Pt's common fem vein stenosis should not be stented and did not respond to angioplasty, popliteal vein occ does not stay open due to downstream stenosis.    - no new clotting issues in the arms or legs. Swelling persists without pain.     Neuropathy.  Neuropathy causing balance issues has worsened over the past year. Conduction studies confirmed neuropathy. Currently taking a nerve supplement that has improved symptoms.  To FU with neuro per their recs.       Fatty liver/hepatosplenomegaly - drinks alcohol and hx of fatty liver   - FibroScan showing mild fibrosis (F1), fatty liver and organomegaly.  - Liver function tests show mildly increased AST and elevated bilirubin. Alcohol consumption can be  contributing to liver abnormalities. Advised to avoid alcohol.    - organomegaly contributes to sequestration of wbc/plts - leading to cytopenias.      RESUSCITATION DIRECTIVES/HOSPICE CARE: Full Support    MDM    Lab studies and imaging studies were personally reviewed.  Pertinent old records were reviewed.     Historical:  - Thrombocytopenia can be multifactorial, linked to chronic liver disease, possible nutritional defic, primary BM condition, ITP, alcohol consumption, and genetic predisposition.  Elevated bilirubin levels suggest liver dz/Gilbert's disease.  Prev referred to GI for eval and fatty liver was confirmed.    - needs monitoring of TCP as on AC.  AC should be held when plts <50K or with active bleeding.    - A comprehensive lab workup will be conducted today, including tests for autoimmune causes, viral serologies, and [employer]. AFP will be checked.    - total abdominal ultrasound will be ordered to assess the size of the spleen and liver.  Prev liver at 20-21cm and spleen 16cm.      All questions were asked and answered to the best of my ability.  The patient verbalized understanding and agrees with the plan above.      Asia Kathryne) Nalani, MD  [facility]  [address]  [place], [place] [address]  Office : [phone]  Fax : [phone]

## 2024-04-28 ENCOUNTER — Inpatient Hospital Stay: Admit: 2024-04-28 | Payer: MEDICARE | Primary: Family Medicine

## 2024-04-28 DIAGNOSIS — I890 Lymphedema, not elsewhere classified: Principal | ICD-10-CM

## 2024-04-28 NOTE — Therapy Evaluation (Signed)
 Chad Rosario  DOB: 05-16-1955  Primary: Medicare Part A And B (Medicare)  Secondary: [facility] CARE MEDICARE SUPP [facility]  [address]  [place] [address]  Phone: [phone]  Fax: [phone] Plan Frequency: 2x/week for 90 days    Certification Period Expiration Date: 07/27/24     Events      Plan of Care/Certification Expiration Date:  Certification Period Expiration Date: 07/27/24      Frequency/Duration: Plan Frequency: 2x/week for 90 days      Time In/Out:   Time In: 1040  Time Out: 1155  Minutes: 75     OT Visit Info:  Plan Frequency: 2x/week for 90 days  Progress Note Due Date: 05/28/24  Total # of Visits to Date: 1  Progress Note Counter: 1       Visit Count: 1   OUTPATIENT OCCUPATIONAL THERAPY:Initial Assessment 04/28/2024  Episode: (Lymphedema)           Treatment Diagnosis:    Lymphedema, not elsewhere classified  Medical/Referring Diagnosis:    Lymphedema, not elsewhere classified  Pseudarthrosis after fusion or arthrodesis     Referring Physician:  Josetta Etta NOVAK, MD     MD Orders:  OT Eval and Treat   Return MD Appt:  unknown  Date of Onset:  Onset Date: -- (2020)     Allergies:  Hydrocodone -acetaminophen , Losartan, Amlodipine, Rosuvastatin, Dermabond, Atorvastatin, and Ezetimibe  Restrictions/Precautions:      Lymphedema Precautions    Medications Last Reviewed:  04/28/2024    SUBJECTIVE   History of Injury/Illness (Reason for Referral):  Patient had major back surgery, fusion from T8-S1, in 2020. He reports worsening numbness in feet after surgery, and worsening swelling in feet and lower legs about 2 years ago.He had a MO's procedure a year ago on the right LE that will not heal. He reports walking is very difficult. Retired 3 years ago and not able to play with grandkids or golf. 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 Normally works out but has not until cleared after pacemaker surgery. Been about a year and a half since he has walked  for exercise because it is difficult and not enjoyable. He saw a vascular specialist and had an ablation to help clean out leg veins. Had a DVT in right leg in 2018.  Foot drop in both feet.  Patient Stated Goal(s):  to get the swelling down  Initial Pain Level:  0/10      Post Session Pain Level:  0/10        Past Medical History/Comorbidities:   Chad Rosario  has a past medical history of CAD (coronary artery disease), Cataract, Cervical disc disorder, Colon polyp, Concussion, Gout, Hypertension, Low back pain, Lumbosacral disc disease, Neuromuscular disorder (HCC), Neuropathy, Osteoarthritis, Plantar fasciitis, Skin cancer, Sleep apnea, Spinal stenosis, and Tendinitis.  Chad Rosario  has a past surgical history that includes Cholecystectomy; Cardiac surgery; Total hip arthroplasty (Left); eye surgery (Left); fracture surgery (Left); back surgery (2021); Colonoscopy (N/A, 11/29/2020); Knee arthroscopy (Right); joint replacement (Hip - 2018); ep device procedure (N/A, 05/22/2023); Cardiac catheterization (2016); Spinal fusion (2020); colostomy (2007); lumbar laminectomy (March 2003); Carpal tunnel release (Aug 2019 & Dec 2019); lumbar fusion (Dec 2021); Carotid stent placement (2015); Vasectomy; shoulder surgery; and ep device procedure (N/A, 03/19/2024).  Social History/Living Environment:   Patient lives with their spouse  Type of Home: House: One Story and Number of stairs to enter/exit: 0  Level of  Assistance: Rehab: Modified independent  Assistive Device: Devices: straight cane and None  Additional Equipment: no equipment     Level of Function/Work/Activity:    Employment Status: Retired        Producer, Television/film/video:   Does the patient/guardian have any barriers to learning?: No barriers  Will there be a co-learner?: No  What is the preferred language of the patient/guardian?: English  Is an interpreter required?: No  How does the patient/guardian prefer to learn new concepts?: Listening; Reading; Demonstration; Other  (comment); Pictures/Videos    Fall Risk Scale:   Morse Total Score: 15  Morse Fall Risk: Low (0-24)    Personal Factors:        Sex:  male        Age:  69 y.o.    OBJECTIVE   FUNCTIONAL MOBILITY:  Functional - Mobility Device: Cane  Activity: To/From therapy gym  Assist Level: Modified independent   ADL:  ADL  Feeding: Independent  Grooming: Independent  UE Bathing: Independent  LE Bathing: Independent  UE Dressing: Independent  LE Dressing: Independent  Putting On/Taking Off Footwear: Independent  Toileting: Independent   SENSATION:     Light touch diminished in LE's; deep pressure intact  COGNITION:      Alert and oriented x 4  SKIN INTEGUMENTARY:  Skin Integumentary   Location Description: Bilateral LE's  Skin Integrity: Intact, Scars (comment) (Scar on left lower lateral leg from fracture surgery)  Skin Color: Appropriate for ethnicity  Skin Condition/Temp: Dry, Warm, Flaky  Edema Rebound: Slow   RANGE OF MOTION:     LE's WDL's except limited dorsiflexion of both ankles  STRENGTH:     Grossly 4+/5 bilateral LE's except at the ankles  Lymphedema:  Circumference Measurements (Lower Extremity):   Date:  2/4 Date:   Date: Date: Date:   CM from base of heel Right Left Right Left Right Left Right Left Right Left   Foot  21 28.3 28           Ankle 10 29 29.2           Calf 30 45.6 44.5           Knee 47 44.4 42.6           Thigh                LLIS:  Total Score: 22    Outcome Measure:   Tool Used: Lymphedema Life Impact Scale   Score:  Initial: 22 Most Recent: X (Date: -- )   Interpretation of Score: The Lymphedema Life Impact Scale (LLIS) is a validated instrument that measures the physical, functional, and psychosocial concerns pertinent to patients with extremity lymphedema.  The Scale's questionnaire is administered to patients to gauge impairments, activity limitations, and participation restrictions resulting from their lymphedema.    ASSESSMENT   Initial Assessment:  Patient presents with edema in LE's that is  consistent with early stage 2 lymphedema. Feel he may benefit from skilled occupational therapy to maximize independence with home management of lymphedema.   Therapy Problem List: (Impacting functional limitations):    Decreased ROM, Decreased Functional Mobility, Decreased High-Level IADLs, Edema/Girth, and Decreased Skin Integrity/Hygeine   Therapy Prognosis: Good      Initial Assessment Complexity: Moderate Complexity       PLAN   Effective Dates: 04/28/2024 TO Certification Period Expiration Date: 07/27/24    Frequency/Duration: Plan Frequency: 2x/week for 90 days    Interventions Planned (Treatment may consist of  any combination of the following):    Home Exercise Program (HEP), Manual Lymphatic Drainage, Manual Therapy, Therapeutic Activites, Therapeutic Exercise/Strengthening, Patient/Caregiver Education & Training, and ADL/Self-Care Training   Goals: (Goals have been discussed and agreed upon with patient.)  Short-Term Functional Goals: Time Frame: 45 days  1. The patient/caregiver will verbalize and demonstrate understanding of lymphedema precautions.  2. Patient will be independent with skin care regimen to decrease risk of cellulitis.   3. Patient will be independent in lymphatic exercises.     Discharge Goals: Time Frame: 90 days  1. Patient's bilateral lower extremity circumferential measurements will decrease by 1-2 cm on volumetric graph to maximize functional use in ADL's.    2. The patient/caregiver will be independent with home management of lymphedema.    3. Patient/caregiver will be independent donning and doffing bilateral lower extremity compression garment.       MEDICAL NECESSITY:   > Patient demonstrates good rehab potential due to higher previous functional level.  REASON FOR SERVICES/OTHER COMMENTS:  > Patient continues to require skilled intervention due to decreased independence with home management of lymphedema.      Regarding Chad Rosario's therapy, I certify that the  treatment plan above will be carried out by a therapist or under their direction.  Thank you for this referral,  ELVIE JONETTA GANDER, OT     Referring Physician Signature: Chad Etta NOVAK, MD _______________________________ Date _____________       Charge Capture  Events  [employer] Portal  Appt Desk  Attendance Report

## 2024-04-28 NOTE — Progress Notes (Signed)
 Chad Rosario  DOB: [birth year]  Primary: Medicare Part A And B (Medicare)  Secondary: [facility] CARE MEDICARE SUPP [facility]  [address]  [place] [address]  Phone: [phone]  Fax: [phone]    Plan of Care/Certification Expiration Date:  Certification Period Expiration Date: 07/27/24      Frequency/Duration: Plan Frequency: 2x/week for 90 days      Time In/Out:   Time In: 1040  Time Out: 1155  Minutes: 75     OT Visit Info:  Plan Frequency: 2x/week for 90 days  Progress Note Due Date: 05/28/24  Total # of Visits to Date: 1  Progress Note Counter: 1       Visit Count: 1   OUTPATIENT OCCUPATIONAL THERAPY: Treatment Note 04/28/2024  Episode: (Lymphedema)         Treatment Diagnosis:    Lymphedema, not elsewhere classified  Medical/Referring Diagnosis:   Lymphedema, not elsewhere classified  Pseudarthrosis after fusion or arthrodesis     Referring Physician:  Josetta Etta NOVAK, MD  MD Orders:  OT Eval and Treat   Return MD Appt:  unknown   Date of Onset:  Onset Date: -- (2020)     Allergies:  Hydrocodone -acetaminophen , Losartan, Amlodipine, Rosuvastatin, Dermabond, Atorvastatin, and Ezetimibe  Restrictions/Precautions:     Lymphedema Precautions    Medications Last Reviewed:  04/28/2024     Interventions Planned (Treatment may consist of any combination of the following):     See Assessment Note      Subjective Comments:   Patient reports he can do the things he has to do but can't do all the things he wants to do in retirement.    Initial Pain Level:  0/10      Post Session Pain Level:  0/10        Medications Last Reviewed:  04/28/2024  Updated Objective Findings:  See Evaluation Note from today    Treatment     SELF CARE: (20 minutes):   Procedure(s) (per grid) utilized to improve and/or restore self-care/home management as related to home management of lymphedema. Required moderate visual and verbal cueing to facilitate activities of daily living  skills.  Patient educated re: anatomy and physiology of LE lymphatic system as well as complete decongestive therapy for the LE to include skin care, manual lymph drainage, compression bandaging and garments and exercise. He verbalized and/or demonstrated understanding of all education related to home management of edema/lymphedema.     Treatment/Session Summary:    Treatment Assessment:   Patient agreeable to goals and plan of care.    Communication/Consultation:  Therapy Evaluation sent to referring provider  Equipment provided today:  None  Recommendations/Intent for next treatment session: Next visit will focus on complete decongestive therapy.    >Total Treatment Billable Duration: 20 minutes in addition to evaluation  OT Individual Minutes  Time In: 1040  Time Out: 1155  Minutes: 75    ELVIE JONETTA GANDER, OT   Charge Capture   Events  [facility] Portal  Appt Desk  Attendance Report     Future Appointments   Date Time Provider Department Center   04/29/2024 10:30 AM [facility] DEVICE SCHEDULE [facility] AMB   04/30/2024  9:15 AM Clair Sharper Meth, MD [facility] ECC DEP   05/04/2024  2:30 PM Gander Elvie JONETTA, OT [facility]   05/05/2024  3:00 PM Darleene Breeding, MD [facility] AMB   05/07/2024  1:00 PM  Jakie Elvie BIRCH, OT [facility] SFE   05/10/2024  1:45 PM Jakie Elvie BIRCH, OT [facility] SFE   05/13/2024  8:15 AM Clair Ozell Meth, MD [facility] ECC DEP   05/13/2024  1:45 PM Jakie Elvie BIRCH, OT [facility] SFE   05/17/2024  1:45 PM Jakie Elvie BIRCH, OT [facility] SFE   05/20/2024  1:00 PM Jakie Elvie BIRCH, OT [facility] SFE   08/18/2024  9:45 AM Lajoyce Arthea HERO, MD [facility] GVL AMB   09/28/2024 10:00 AM Dann Hoar, APRN - CNP [facility] GVL AMB   01/26/2025 10:00 AM Aline Kast, MD [facility] G[facility] AMB

## 2024-04-29 ENCOUNTER — Ambulatory Visit: Admit: 2024-04-29 | Discharge: 2024-04-29 | Payer: MEDICARE | Primary: Family Medicine

## 2024-04-29 DIAGNOSIS — I4819 Other persistent atrial fibrillation: Principal | ICD-10-CM

## 2024-04-30 ENCOUNTER — Encounter: Payer: MEDICARE | Attending: Family Medicine | Primary: Family Medicine

## 2024-04-30 ENCOUNTER — Encounter: Payer: MEDICARE | Primary: Family Medicine

## 2024-05-04 ENCOUNTER — Encounter: Payer: MEDICARE | Primary: Family Medicine

## 2024-05-05 ENCOUNTER — Ambulatory Visit: Payer: MEDICARE | Primary: Family Medicine

## 2024-05-05 ENCOUNTER — Encounter: Payer: MEDICARE | Primary: Family Medicine

## 2024-05-07 ENCOUNTER — Encounter: Payer: MEDICARE | Primary: Family Medicine

## 2024-05-10 ENCOUNTER — Encounter: Payer: MEDICARE | Primary: Family Medicine

## 2024-05-13 ENCOUNTER — Encounter: Payer: MEDICARE | Primary: Family Medicine

## 2024-05-17 ENCOUNTER — Encounter: Payer: MEDICARE | Primary: Family Medicine

## 2024-05-20 ENCOUNTER — Encounter: Payer: MEDICARE | Primary: Family Medicine
